# Patient Record
Sex: Female | Born: 1960
Health system: Southern US, Community
[De-identification: ages and names within clinical notes are randomized; demographics above are authoritative.]

## PROBLEM LIST (undated history)

## (undated) DIAGNOSIS — R011 Cardiac murmur, unspecified: Secondary | ICD-10-CM

## (undated) DIAGNOSIS — A159 Respiratory tuberculosis unspecified: Secondary | ICD-10-CM

## (undated) DIAGNOSIS — F1911 Other psychoactive substance abuse, in remission: Secondary | ICD-10-CM

## (undated) DIAGNOSIS — K219 Gastro-esophageal reflux disease without esophagitis: Secondary | ICD-10-CM

## (undated) DIAGNOSIS — N950 Postmenopausal bleeding: Secondary | ICD-10-CM

## (undated) DIAGNOSIS — G43909 Migraine, unspecified, not intractable, without status migrainosus: Secondary | ICD-10-CM

## (undated) DIAGNOSIS — E119 Type 2 diabetes mellitus without complications: Secondary | ICD-10-CM

## (undated) DIAGNOSIS — Z5189 Encounter for other specified aftercare: Secondary | ICD-10-CM

## (undated) DIAGNOSIS — R0609 Other forms of dyspnea: Secondary | ICD-10-CM

## (undated) DIAGNOSIS — N840 Polyp of corpus uteri: Secondary | ICD-10-CM

## (undated) DIAGNOSIS — R3915 Urgency of urination: Secondary | ICD-10-CM

## (undated) DIAGNOSIS — R0600 Dyspnea, unspecified: Secondary | ICD-10-CM

## (undated) DIAGNOSIS — E785 Hyperlipidemia, unspecified: Secondary | ICD-10-CM

## (undated) DIAGNOSIS — M1990 Unspecified osteoarthritis, unspecified site: Secondary | ICD-10-CM

## (undated) DIAGNOSIS — I2510 Atherosclerotic heart disease of native coronary artery without angina pectoris: Secondary | ICD-10-CM

## (undated) DIAGNOSIS — I10 Essential (primary) hypertension: Secondary | ICD-10-CM

## (undated) DIAGNOSIS — R51 Headache: Secondary | ICD-10-CM

## (undated) DIAGNOSIS — R42 Dizziness and giddiness: Secondary | ICD-10-CM

## (undated) DIAGNOSIS — M25511 Pain in right shoulder: Secondary | ICD-10-CM

## (undated) DIAGNOSIS — M722 Plantar fascial fibromatosis: Secondary | ICD-10-CM

## (undated) DIAGNOSIS — Z9289 Personal history of other medical treatment: Secondary | ICD-10-CM

## (undated) DIAGNOSIS — T7840XA Allergy, unspecified, initial encounter: Secondary | ICD-10-CM

## (undated) DIAGNOSIS — F1910 Other psychoactive substance abuse, uncomplicated: Secondary | ICD-10-CM

## (undated) HISTORY — DX: Essential (primary) hypertension: I10

## (undated) HISTORY — DX: Urgency of urination: R39.15

## (undated) HISTORY — DX: Gastro-esophageal reflux disease without esophagitis: K21.9

## (undated) HISTORY — PX: TUBAL LIGATION: SHX77

## (undated) HISTORY — DX: Headache: R51

## (undated) HISTORY — DX: Personal history of other medical treatment: Z92.89

## (undated) HISTORY — DX: Allergy, unspecified, initial encounter: T78.40XA

## (undated) HISTORY — DX: Cardiac murmur, unspecified: R01.1

## (undated) HISTORY — DX: Dizziness and giddiness: R42

## (undated) HISTORY — DX: Unspecified osteoarthritis, unspecified site: M19.90

## (undated) HISTORY — PX: UPPER GI ENDOSCOPY: SHX6162

## (undated) HISTORY — DX: Other psychoactive substance abuse, uncomplicated: F19.10

## (undated) HISTORY — DX: Migraine, unspecified, not intractable, without status migrainosus: G43.909

## (undated) HISTORY — PX: COLONOSCOPY: SHX174

## (undated) HISTORY — DX: Type 2 diabetes mellitus without complications: E11.9

## (undated) HISTORY — DX: Pain in right shoulder: M25.511

## (undated) HISTORY — DX: Encounter for other specified aftercare: Z51.89

## (undated) HISTORY — PX: KNEE SURGERY: SHX244

## (undated) HISTORY — PX: NASAL POLYP SURGERY: SHX186

## (undated) HISTORY — DX: Hyperlipidemia, unspecified: E78.5

## (undated) HISTORY — DX: Atherosclerotic heart disease of native coronary artery without angina pectoris: I25.10

## (undated) HISTORY — DX: Plantar fascial fibromatosis: M72.2

## (undated) HISTORY — DX: Respiratory tuberculosis unspecified: A15.9

## (undated) HISTORY — DX: Polyp of corpus uteri: N84.0

## (undated) HISTORY — PX: MYOMECTOMY: SHX85

---

## 1996-01-02 DIAGNOSIS — A159 Respiratory tuberculosis unspecified: Secondary | ICD-10-CM

## 1996-01-02 DIAGNOSIS — Z8611 Personal history of tuberculosis: Secondary | ICD-10-CM

## 1996-01-02 HISTORY — DX: Personal history of tuberculosis: Z86.11

## 1996-01-02 HISTORY — DX: Respiratory tuberculosis unspecified: A15.9

## 1998-12-22 ENCOUNTER — Emergency Department (HOSPITAL_COMMUNITY): Admission: EM | Admit: 1998-12-22 | Discharge: 1998-12-22 | Payer: Self-pay | Admitting: Emergency Medicine

## 2000-04-07 ENCOUNTER — Emergency Department (HOSPITAL_COMMUNITY): Admission: EM | Admit: 2000-04-07 | Discharge: 2000-04-07 | Payer: Self-pay | Admitting: Emergency Medicine

## 2000-04-12 ENCOUNTER — Emergency Department (HOSPITAL_COMMUNITY): Admission: EM | Admit: 2000-04-12 | Discharge: 2000-04-12 | Payer: Self-pay | Admitting: Emergency Medicine

## 2001-05-04 ENCOUNTER — Emergency Department (HOSPITAL_COMMUNITY): Admission: EM | Admit: 2001-05-04 | Discharge: 2001-05-04 | Payer: Self-pay | Admitting: Emergency Medicine

## 2005-03-14 ENCOUNTER — Emergency Department (HOSPITAL_COMMUNITY): Admission: EM | Admit: 2005-03-14 | Discharge: 2005-03-15 | Payer: Self-pay | Admitting: Emergency Medicine

## 2005-03-17 ENCOUNTER — Emergency Department (HOSPITAL_COMMUNITY): Admission: EM | Admit: 2005-03-17 | Discharge: 2005-03-17 | Payer: Self-pay | Admitting: *Deleted

## 2005-06-26 ENCOUNTER — Ambulatory Visit: Payer: Self-pay | Admitting: Internal Medicine

## 2005-06-26 ENCOUNTER — Other Ambulatory Visit: Admission: RE | Admit: 2005-06-26 | Discharge: 2005-06-26 | Payer: Self-pay | Admitting: Family Medicine

## 2005-10-03 ENCOUNTER — Ambulatory Visit: Payer: Self-pay | Admitting: Internal Medicine

## 2005-10-11 ENCOUNTER — Ambulatory Visit: Payer: Self-pay | Admitting: Internal Medicine

## 2005-10-11 ENCOUNTER — Ambulatory Visit: Admission: RE | Admit: 2005-10-11 | Discharge: 2005-10-11 | Payer: Self-pay | Admitting: Internal Medicine

## 2005-10-11 ENCOUNTER — Encounter (INDEPENDENT_AMBULATORY_CARE_PROVIDER_SITE_OTHER): Payer: Self-pay | Admitting: Specialist

## 2006-03-25 ENCOUNTER — Emergency Department (HOSPITAL_COMMUNITY): Admission: EM | Admit: 2006-03-25 | Discharge: 2006-03-25 | Payer: Self-pay | Admitting: Family Medicine

## 2006-04-01 ENCOUNTER — Emergency Department (HOSPITAL_COMMUNITY): Admission: EM | Admit: 2006-04-01 | Discharge: 2006-04-01 | Payer: Self-pay | Admitting: Family Medicine

## 2006-08-05 ENCOUNTER — Emergency Department (HOSPITAL_COMMUNITY): Admission: EM | Admit: 2006-08-05 | Discharge: 2006-08-05 | Payer: Self-pay | Admitting: Emergency Medicine

## 2006-08-16 ENCOUNTER — Emergency Department (HOSPITAL_COMMUNITY): Admission: EM | Admit: 2006-08-16 | Discharge: 2006-08-16 | Payer: Self-pay | Admitting: Family Medicine

## 2006-09-24 ENCOUNTER — Emergency Department (HOSPITAL_COMMUNITY): Admission: EM | Admit: 2006-09-24 | Discharge: 2006-09-24 | Payer: Self-pay | Admitting: Emergency Medicine

## 2006-12-23 ENCOUNTER — Emergency Department (HOSPITAL_COMMUNITY): Admission: EM | Admit: 2006-12-23 | Discharge: 2006-12-23 | Payer: Self-pay | Admitting: Emergency Medicine

## 2007-04-25 ENCOUNTER — Emergency Department (HOSPITAL_COMMUNITY): Admission: EM | Admit: 2007-04-25 | Discharge: 2007-04-25 | Payer: Self-pay | Admitting: Emergency Medicine

## 2007-07-25 ENCOUNTER — Ambulatory Visit: Payer: Self-pay | Admitting: *Deleted

## 2007-07-25 ENCOUNTER — Encounter (INDEPENDENT_AMBULATORY_CARE_PROVIDER_SITE_OTHER): Payer: Self-pay | Admitting: *Deleted

## 2007-07-29 ENCOUNTER — Ambulatory Visit (HOSPITAL_COMMUNITY): Admission: RE | Admit: 2007-07-29 | Discharge: 2007-07-29 | Payer: Self-pay | Admitting: Family Medicine

## 2007-08-06 ENCOUNTER — Encounter: Admission: RE | Admit: 2007-08-06 | Discharge: 2007-08-06 | Payer: Self-pay | Admitting: Family Medicine

## 2008-04-06 ENCOUNTER — Emergency Department (HOSPITAL_COMMUNITY): Admission: EM | Admit: 2008-04-06 | Discharge: 2008-04-06 | Payer: Self-pay | Admitting: Emergency Medicine

## 2008-05-17 ENCOUNTER — Encounter (INDEPENDENT_AMBULATORY_CARE_PROVIDER_SITE_OTHER): Payer: Self-pay | Admitting: General Surgery

## 2008-05-17 ENCOUNTER — Ambulatory Visit (HOSPITAL_BASED_OUTPATIENT_CLINIC_OR_DEPARTMENT_OTHER): Admission: RE | Admit: 2008-05-17 | Discharge: 2008-05-17 | Payer: Self-pay | Admitting: General Surgery

## 2008-05-17 HISTORY — PX: SOFT TISSUE MASS EXCISION: SHX2419

## 2008-08-13 ENCOUNTER — Encounter: Admission: RE | Admit: 2008-08-13 | Discharge: 2008-08-13 | Payer: Self-pay | Admitting: Emergency Medicine

## 2008-09-14 ENCOUNTER — Emergency Department (HOSPITAL_COMMUNITY): Admission: EM | Admit: 2008-09-14 | Discharge: 2008-09-14 | Payer: Self-pay | Admitting: Emergency Medicine

## 2008-12-28 ENCOUNTER — Emergency Department (HOSPITAL_COMMUNITY): Admission: EM | Admit: 2008-12-28 | Discharge: 2008-12-28 | Payer: Self-pay | Admitting: Emergency Medicine

## 2009-02-09 ENCOUNTER — Ambulatory Visit (HOSPITAL_COMMUNITY): Admission: RE | Admit: 2009-02-09 | Discharge: 2009-02-09 | Payer: Self-pay | Admitting: Family Medicine

## 2009-02-14 ENCOUNTER — Encounter: Admission: RE | Admit: 2009-02-14 | Discharge: 2009-02-14 | Payer: Self-pay | Admitting: Family Medicine

## 2009-02-25 ENCOUNTER — Ambulatory Visit: Payer: Self-pay | Admitting: Obstetrics and Gynecology

## 2009-02-25 LAB — CONVERTED CEMR LAB: Pap Smear: NEGATIVE

## 2009-09-07 ENCOUNTER — Emergency Department (HOSPITAL_COMMUNITY): Admission: EM | Admit: 2009-09-07 | Discharge: 2009-09-08 | Payer: Self-pay | Admitting: Emergency Medicine

## 2010-01-01 HISTORY — PX: BREAST BIOPSY: SHX20

## 2010-04-03 LAB — CBC
Hemoglobin: 12.9 g/dL (ref 12.0–15.0)
RDW: 13.3 % (ref 11.5–15.5)

## 2010-04-03 LAB — URINE MICROSCOPIC-ADD ON

## 2010-04-03 LAB — DIFFERENTIAL
Basophils Relative: 1 % (ref 0–1)
Eosinophils Absolute: 0.1 10*3/uL (ref 0.0–0.7)
Lymphocytes Relative: 26 % (ref 12–46)
Lymphs Abs: 2.1 10*3/uL (ref 0.7–4.0)
Monocytes Absolute: 0.4 10*3/uL (ref 0.1–1.0)
Monocytes Relative: 5 % (ref 3–12)

## 2010-04-03 LAB — BASIC METABOLIC PANEL
Calcium: 9.3 mg/dL (ref 8.4–10.5)
Chloride: 105 mEq/L (ref 96–112)
Creatinine, Ser: 0.68 mg/dL (ref 0.4–1.2)
GFR calc non Af Amer: >60 mL/min (ref >60)
Potassium: 3.9 mEq/L (ref 3.5–5.1)

## 2010-04-03 LAB — URINALYSIS, ROUTINE W REFLEX MICROSCOPIC
Glucose, UA: NEGATIVE mg/dL
Hgb urine dipstick: NEGATIVE
Ketones, ur: 15 mg/dL — AB
Nitrite: NEGATIVE
Specific Gravity, Urine: 1.027 (ref 1.005–1.030)

## 2010-04-07 LAB — POCT URINALYSIS DIP (DEVICE)
Bilirubin Urine: NEGATIVE
Hgb urine dipstick: NEGATIVE
Ketones, ur: NEGATIVE mg/dL
Nitrite: NEGATIVE
Specific Gravity, Urine: >=1.03 (ref 1.005–1.030)

## 2010-04-11 LAB — CBC
HCT: 34.2 % — ABNORMAL LOW (ref 36.0–46.0)
Hemoglobin: 11.5 g/dL — ABNORMAL LOW (ref 12.0–15.0)
MCHC: 33.7 g/dL (ref 30.0–36.0)
MCV: 91.9 fL (ref 78.0–100.0)
Platelets: 226 10*3/uL (ref 150–400)
RBC: 3.72 MIL/uL — ABNORMAL LOW (ref 3.87–5.11)
RDW: 13.1 % (ref 11.5–15.5)
WBC: 5.6 10*3/uL (ref 4.0–10.5)

## 2010-04-11 LAB — URINALYSIS, ROUTINE W REFLEX MICROSCOPIC
Glucose, UA: NEGATIVE mg/dL
Nitrite: NEGATIVE
Urobilinogen, UA: 1 mg/dL (ref 0.0–1.0)
pH: 5.5 (ref 5.0–8.0)

## 2010-04-11 LAB — COMPREHENSIVE METABOLIC PANEL
Albumin: 3.9 g/dL (ref 3.5–5.2)
Alkaline Phosphatase: 91 U/L (ref 39–117)
BUN: 12 mg/dL (ref 6–23)
CO2: 25 mEq/L (ref 19–32)
Chloride: 104 mEq/L (ref 96–112)
Creatinine, Ser: 0.66 mg/dL (ref 0.4–1.2)
GFR calc Af Amer: >60 mL/min (ref >60)
GFR calc non Af Amer: >60 mL/min (ref >60)
Glucose, Bld: 114 mg/dL — ABNORMAL HIGH (ref 70–99)
Sodium: 137 mEq/L (ref 135–145)
Total Bilirubin: 0.5 mg/dL (ref 0.3–1.2)
Total Protein: 6.8 g/dL (ref 6.0–8.3)

## 2010-04-11 LAB — DIFFERENTIAL
Lymphs Abs: 1.9 10*3/uL (ref 0.7–4.0)
Monocytes Absolute: 0.4 10*3/uL (ref 0.1–1.0)
Monocytes Relative: 8 % (ref 3–12)
Neutrophils Relative %: 54 % (ref 43–77)

## 2010-04-11 LAB — URINE MICROSCOPIC-ADD ON

## 2010-05-16 NOTE — Op Note (Signed)
NAME:  Brianna Hawkins, Brianna Hawkins              ACCOUNT NO.:  [id number]   MEDICAL RECORD NO.:  [id number]          PATIENT TYPE:  AMB   LOCATION:  [facility]                         FACILITY:  [facility]   PHYSICIAN:  Angelia Mould. Derrell Lolling, M.D.DATE OF BIRTH:  [birth year]   DATE OF PROCEDURE:  05/17/2008  DATE OF DISCHARGE:                               OPERATIVE REPORT   PREOPERATIVE DIAGNOSIS:  A 6 x 3-cm soft tissue mass, right axilla.   POSTOPERATIVE DIAGNOSIS:  A 6 x 3-cm soft tissue mass, right axilla,  plus redundant skin.   OPERATION PERFORMED:  1. Excision 6-cm x 3-cm soft tissue mass, deep right axillary space.  2. Revision of right axillary scar for redundant skin.   SURGEON:  Angelia Mould. Derrell Lolling, M.D.   OPERATIVE INDICATIONS:  This is a 50 year old [place] American female  who has had a lump in her right axilla for about 5 years.  It has been  more painful recently, but it has never been infected or inflamed or  reddened, and it has never drained.  She has never had a breast problem.  She had mammograms in August 2009.  There were no abnormalities found.  The right axilla just showed fatty tissue and normal-appearing axillary  nodes.  I evaluated her as an outpatient and found that she had a 6-cm x  3-cm soft tissue mass in the right axilla which was protruding outward  and stretching the skin, but felt like fatty tissue or possibly ectopic  breast tissue.  She desired that this be excised to clarify her  diagnosis and to remove the mass effect from her right axilla.   OPERATIVE TECHNIQUE:  Following the induction of general LMA anesthesia,  the patient's right chest wall and right axilla were prepped and draped  in a sterile fashion.  A surgical time-out was held, identifying correct  patient, correct procedure and correct site.  Intravenous antibiotics  were given.  Half percent Marcaine with epinephrine was used as a local  infiltration anesthetic.  A slightly curved transverse incision was  made  in the skin crease overlying the mass.  Dissection was carried down into  the superficial and subcutaneous tissue.  I spent some time debriding  subcutaneous fat and then extending down into the axilla.  The margins  of this mass were poorly defined.  It simply just seemed to be an  infiltrating but benign fatty process.  I was not sure whether this was  all lipomatous tissue or whether there it was also ectopic breast  tissue.  It was debrided deeply into the axilla until the entire mass  effect was removed.   This left excessive redundant skin.  With the skin on stretch, I used a  marking pen and marked an elliptical incision all the way around this,  and then I excised all of the skin using the markers as a guide.  This  did flatten the skin out quite nicely.  The wound was irrigated with  saline.  Hemostasis was excellent and achieved with electrocautery.  The  deeper soft tissues were closed with interrupted sutures of 3-0  Vicryl  and the skin was closed with a running  subcuticular suture of 4-0 Monocryl and Steri-Strips.  Clean bandages  were placed and the patient taken to recovery room in stable condition.  Estimated blood loss was about 20 mL.  Complications:  None.  Sponge,  needle and instrument counts were correct.      Angelia Mould. Derrell Lolling, M.D.  Electronically Signed     HMI/MEDQ  D:  05/17/2008  T:  05/17/2008  Job:  [id number]   cc:   Elvina Sidle, M.D.  Fax: [phone]

## 2010-05-16 NOTE — Group Therapy Note (Signed)
NAME:  Brianna Hawkins, Brianna Hawkins NO.:  [id number]   MEDICAL RECORD NO.:  [id number]          PATIENT TYPE:  WOC   LOCATION:  [facility]                   FACILITY:  [facility]   PHYSICIAN:  Karlton Lemon, MD      DATE OF BIRTH:  1960/07/02   DATE OF SERVICE:  07/25/2007                                  CLINIC NOTE   CHIEF COMPLAINT:  Annual examination.   HISTORY OF PRESENT ILLNESS:  A 50 year old gravida 5, para 4-0-1-4  presenting for annual well-woman examination.  She states that she has  not had a well-woman exam in greater than 7 years.  She has never had a  mammogram.  She states that she is fairly healthy and has no chronic  medical conditions for which she takes any medications or sees a  physician.  She states that her periods are fairly regular and occurring  every 28 days and lasting for 7 days.  She states she uses 6 tampons on  average a day.  She says that every once in a while she will have her  periods that are 21 days apart, but that is only on rare occasions.  She  states that she has noted a tender a bump in her right axilla.  She has  no other complaints of breast masses.  She does state that her breasts  are more tender than they used to be.  She notes no dimpling or drainage  from the nipple.  She is otherwise well today.  There is no other  complaints.   PAST MEDICAL HISTORY:  Positive only for some mild arthritis which she  treats with naproxen as needed.   OBSTETRICAL HISTORY:  The patient is a gravida 5, para 4-0-1-4 with  history of 1 therapeutic abortions and 4 term vaginal deliveries.   GYNECOLOGIC HISTORY:  The patient reports no abnormal Pap smears.  She  has never had a mammogram.  She has no history of gynecologic surgery  other than a tubal ligation after the delivery of her fourth child.   PAST SURGICAL HISTORY:  The patient has had a postpartum bilateral tubal  ligation.   FAMILY HISTORY:  The patient says that her father had throat  and  prostate cancer, and she has a niece with cervical cancer.  No other  family medical history is reported.   SOCIAL HISTORY:  The patient works in Public affairs consultant at [employer].  She is currently in a monogamous relationship with 1  partner.  She denies tobacco or drug abuse.  She states she does drink  about 1 can of beer weekly.   MEDICATIONS:  He takes,  1. Iron over-the-counter 65 mg 1 tablet 2-3 times a day.  2. Aleve/Naprosyn over-the-counter 1 tablet daily as needed for pain.   ALLERGIES:  The patient denies drug allergies or allergy to latex.   REVIEW OF SYSTEMS:  The patient denies chest pain, shortness of breath,  abdominal pain, vaginal discharge, or extremity swelling.  She states  she does have occasional heartburn symptoms.  Review of systems,  otherwise negative.   PHYSICAL EXAMINATION:  GENERAL:  This is a well-appearing female in no  distress.  VITAL SIGNS:  Temperature 97.4, pulse 69, blood pressure 132/89,  respiratory rate 20, and weight 222.7 pounds.  NECK:  Thyroid is normal in size and there are no palpable nodules or  masses in the thyroid.  There is no neck lymphadenopathy.  BREASTS:  Symmetric bilaterally without dimpling or retraction of the  nipple.  There are no masses felt within the breast tissue.  In the  right axilla, there is a tender mass measuring about 3-4 cm in size that  is mobile and not fluctuant.  Otherwise no masses palpated in the breast  or left axilla.  CARDIOVASCULAR:  Heart is regular rate and rhythm.  No murmurs, rubs, or  gallops appreciated.  LUNGS:  Clear to auscultation.  RESPIRATORY:  Lungs are clear to auscultation bilaterally.  ABDOMEN:  Soft, nontender to palpation.  GENITOURINARY:  Normal female external genitalia.  Vaginal mucosa is  pink and moist.  Cervix is midline but difficult to visualize secondary  to redundant vaginal wall tissue.  Cervix is well visualized.  The os is  closed.  Pap smear  is collected.  Bimanual examination is limited  secondary to body habitus.  The uterus feels normal in size.  Adnexa are  not palpable.  There is no tenderness on bimanual examination.  EXTREMITIES:  There is no clubbing, cyanosis, or edema.   ASSESSMENT AND PLAN:  This is a 50 year old gravida 5, para 4-0-1-4,  here for annual examination.  The patient is noted to have a right  axillary tender mass/possible lymph node.  1. Well-woman examination is performed with Pap smear and breast      examination.  The patient will be referred for routine mammogram.  2. The patient will be started on Keflex 500 mg 2 tablets b.i.d. for      10 days to treat any infection that may be complicating this lymph      node enlargement or tenderness.  The patient is also to follow up      with General Surgery for consultation on this right axillary mass.      The patient is to follow up in 1 year for repeat annual      examination.           ______________________________  Karlton Lemon, MD     NS/MEDQ  D:  07/25/2007  T:  07/25/2007  Job:  [id number]

## 2010-05-19 NOTE — Op Note (Signed)
NAME:  Brianna Hawkins, Brianna Hawkins              ACCOUNT NO.:  [id number]   MEDICAL RECORD NO.:  [id number]          PATIENT TYPE:  AMB   LOCATION:  [facility]                         FACILITY:  [facility]   PHYSICIAN:  Casimiro Needle B. Sherene Sires, MD, FCCPDATE OF BIRTH:  [birth year]   DATE OF PROCEDURE:  DATE OF DISCHARGE:                                 OPERATIVE REPORT   REFERRED BY:  Lavonda Jumbo, M.D., [facility].   HISTORY AND INDICATIONS:  Please see dictated pulmonary consultation note.  The patient agreed to the procedure after a full discussion of the risks,  benefits, alternatives in the office setting.   The patient was monitored by continuous surface ECG and oximetry and  maintained adequate saturations throughout the procedure.  She received 1%  lidocaine by updraft nebulizer and 2% lidocaine jelly to the right nares.   The right naris was easily cannulated with good visualization of the entire  oropharynx and larynx.  The cords moved normally and there were no obvious  upper airway lesions.   The patient was medicated with a total of 5 mg of IV Versed and 50 mg of IV  Demerol throughout the procedure with adequate sedation and cough  suppression. An additional 1% lidocaine as needed was used as well.   FINDINGS:  The trachea, carina, and all the major airways opened widely to  the subsegmental level except for the right upper lobe anterior segment.  Here there appeared to be about a 75% obstruction at the orifice with a  smooth bulge of tissue that appeared to be extrinsic in nature with normal  mucosa over it.   Initially I attempted to biopsy the mass seen on chest x-ray by fluoroscopy  but could not get the scope to go anterior enough for this.  Part of the  problem was the bulge pushed the bronchoscope to the airway that did not  correspond to the density.   The right upper lobe was initially lavaged with adequate return.   I therefore performed a standard Wang biopsy at the orifice of the right  upper lobe with adequate tissue obtained.  There was moderate bleeding which  cleared with saline lavage and suctioning.   IMPRESSION:  Mass with 75% obstruction of the right upper lobe.  Although  this may be benign lymph tissue, the amount of obstruction is significant  and easily explains the abnormality seen on x-ray and probably is at least  partially obstructing one of the subsegments of the right upper lobe.   RECOMMENDATION:  Await bronchial lavage and Wang biopsy for results. Follow-  up in the outpatient setting which will include a PET scan to see if this is  a biologically active area or not.          ______________________________  Charlaine Dalton. Sherene Sires, MD, [facility]    MBW/MEDQ  D:  10/11/2005  T:  10/13/2005  Job:  [id number]

## 2010-05-19 NOTE — Assessment & Plan Note (Signed)
[facility]                               PULMONARY OFFICE NOTE   NAME:Brianna Hawkins, Depaulo                       MRN:          [id number]  DATE:10/03/2005                            DOB:          [birth year]    HISTORY:  A 50 year old black female with a history of smoking and a right  upper lobe mass by chest x-ray and CT scan who has failed to return to the  office, had listed a phone number no long in service and was contacted by  mail and asked to return.  The letter was mailed on August 08, 2005.  She  returns now with no significant symptoms for a follow-up x-ray.  She  denies any hemoptysis, pleuritic pain, fevers, chills, sweats, orthopnea,  PND or leg swelling.  Please see previous evaluation dictated 08/26/2005 in  the e-chart record.   PHYSICAL EXAMINATION:  GENERAL:  She is a pleasant, ambulatory black female  in no acute distress.  VITAL SIGNS:  She had stable vital signs.  HEENT:  Unremarkable.  CHEST:  Superior lung fields revealed diminished breath sounds to the right  apex overall air movement is adequate.  HEART:  Regular rhythm without murmur, gallop or rub.  ABDOMEN:  Soft, benign.  EXTREMITIES:  No calf tenderness, cyanosis or clubbing.   Chest x-ray shows reduction in the mass in the right chest; however, most  of this is because of distal atelectasis.   IMPRESSION:  I am concerned that isolated right upper lobe atelectasis  anterior is most likely not due to passive atelectasis from mucous plug or  previous pneumonia, but rather due to obstruction proximally.  I have  discussed this openly with the patient and strongly she recommended she  proceed to bronchoscopy as soon as she can schedule it.  Tentatively, we  have set the date for October 11, 2005 after full discussion of risks,  benefits and alternatives.            ______________________________  Charlaine Dalton Sherene Sires, MD, [facility]      MBW/MedQ  DD:  10/03/2005  DT:   10/05/2005  Job #:  [id number]

## 2010-07-04 ENCOUNTER — Emergency Department (HOSPITAL_COMMUNITY)
Admission: EM | Admit: 2010-07-04 | Discharge: 2010-07-04 | Disposition: A | Discharge status: Home / Self Care | Payer: PRIVATE HEALTH INSURANCE | Attending: Emergency Medicine | Admitting: Emergency Medicine

## 2010-07-04 DIAGNOSIS — M25569 Pain in unspecified knee: Secondary | ICD-10-CM | POA: Insufficient documentation

## 2010-07-04 DIAGNOSIS — M25559 Pain in unspecified hip: Secondary | ICD-10-CM | POA: Insufficient documentation

## 2010-07-04 DIAGNOSIS — Y990 Civilian activity done for income or pay: Secondary | ICD-10-CM | POA: Insufficient documentation

## 2010-07-04 DIAGNOSIS — W010XXA Fall on same level from slipping, tripping and stumbling without subsequent striking against object, initial encounter: Secondary | ICD-10-CM | POA: Insufficient documentation

## 2010-07-04 DIAGNOSIS — Y9269 Other specified industrial and construction area as the place of occurrence of the external cause: Secondary | ICD-10-CM | POA: Insufficient documentation

## 2010-09-26 LAB — POCT I-STAT, CHEM 8
BUN: 12
Calcium, Ion: 1.25
Chloride: 105
Creatinine, Ser: 0.9
Glucose, Bld: 96
HCT: 37
Hemoglobin: 12.6
Sodium: 140

## 2011-01-12 ENCOUNTER — Encounter (HOSPITAL_COMMUNITY): Payer: Self-pay | Admitting: *Deleted

## 2011-01-12 ENCOUNTER — Emergency Department (HOSPITAL_COMMUNITY)
Admission: EM | Admit: 2011-01-12 | Discharge: 2011-01-12 | Disposition: A | Discharge status: Home / Self Care | Payer: PRIVATE HEALTH INSURANCE | Attending: Emergency Medicine | Admitting: Emergency Medicine

## 2011-01-12 DIAGNOSIS — H81399 Other peripheral vertigo, unspecified ear: Secondary | ICD-10-CM | POA: Insufficient documentation

## 2011-01-12 DIAGNOSIS — R112 Nausea with vomiting, unspecified: Secondary | ICD-10-CM | POA: Insufficient documentation

## 2011-01-12 MED ORDER — ONDANSETRON 4 MG PO TBDP: 4.0000 mg | ORAL_TABLET | Freq: Three times a day (TID) | ORAL | Status: AC | PRN

## 2011-01-12 MED ORDER — MECLIZINE HCL 32 MG PO TABS: 32.0000 mg | ORAL_TABLET | Freq: Three times a day (TID) | ORAL | Status: AC | PRN

## 2011-01-12 NOTE — ED Notes (Signed)
Dizziness and nausea this afternoon.  She has had x2  In the past.

## 2011-01-12 NOTE — ED Notes (Signed)
MD at bedside. Dr.Miller with patient 

## 2011-01-12 NOTE — ED Provider Notes (Signed)
History     CSN: [id number]  Arrival date & time 01/12/11  2224   First MD Initiated Contact with Patient 01/12/11 2258      Chief Complaint  Patient presents with  . Dizziness    (Consider location/radiation/quality/duration/timing/severity/associated sxs/prior treatment) HPI Comments: 51 year old female with history of borderline hypertension who presents with a complaint of dizziness. This was acute in onset approximately 8 hours prior to arrival. It is intermittent, described as the room spinning and associated with nausea and vomiting. She states that initially it started when she was moving her head raising her arms and looking up. It goes away if she holds her head still, is not associated with blurry vision or double vision or numbness weakness or tingling. Symptoms have been intermittent throughout the day, multiple episodes but not currently feeling dizzy. She denies any chest pain palpitation shortness of breath abdominal pain dysuria diarrhea or rash easy bruising sore throat cough or any other complaints.  The history is provided by the patient.    History reviewed. No pertinent past medical history.  History reviewed. No pertinent past surgical history.  History reviewed. No pertinent family history.  History  Substance Use Topics  . Smoking status: Never Smoker   . Smokeless tobacco: Not on file  . Alcohol Use: No    OB History    Grav Para Term Preterm Abortions TAB SAB Ect Mult Living                  Review of Systems  All other systems reviewed and are negative.    Allergies  Review of patient's allergies indicates no known allergies.  Home Medications   Current Outpatient Rx  Name Route Sig Dispense Refill  . MECLIZINE HCL 32 MG PO TABS Oral Take 1 tablet (32 mg total) by mouth 3 (three) times daily as needed. 30 tablet 0  . ONDANSETRON 4 MG PO TBDP Oral Take 1 tablet (4 mg total) by mouth every 8 (eight) hours as needed for nausea. 10 tablet 0      BP 149/98  Pulse 71  Temp(Src) 98.1 F (36.7 C) (Oral)  Resp 18  Ht 5\' 4"  (1.626 m)  Wt 218 lb (98.884 kg)  BMI 37.42 kg/m2  SpO2 97%  LMP 01/12/2011  Physical Exam  Nursing note and vitals reviewed. Constitutional: She appears well-developed and well-nourished. No distress.  HENT:  Head: Normocephalic and atraumatic.  Mouth/Throat: Oropharynx is clear and moist. No oropharyngeal exudate.  Eyes: Conjunctivae and EOM are normal. Pupils are equal, round, and reactive to light. Right eye exhibits no discharge. Left eye exhibits no discharge. No scleral icterus.  Neck: Normal range of motion. Neck supple. No JVD present. No thyromegaly present.  Cardiovascular: Normal rate, regular rhythm, normal heart sounds and intact distal pulses.  Exam reveals no gallop and no friction rub.   No murmur heard. Pulmonary/Chest: Effort normal and breath sounds normal. No respiratory distress. She has no wheezes. She has no rales.  Abdominal: Soft. Bowel sounds are normal. She exhibits no distension and no mass. There is no tenderness.  Musculoskeletal: Normal range of motion. She exhibits no edema and no tenderness.  Lymphadenopathy:    She has no cervical adenopathy.  Neurological: She is alert. Coordination normal.       Neurologic exam:  Speech clear, pupils equal round reactive to light, extraocular movements intact  Normal peripheral visual fields Cranial nerves III through XII normal including no facial droop Follows commands, moves all  extremities x4, normal strength to bilateral upper and lower extremities at all major muscle groups including grip Sensation normal to light touch and pinprick Coordination intact, no limb ataxia, finger-nose-finger normal Rapid alternating movements normal No pronator drift Gait normal   Skin: Skin is warm and dry. No rash noted. No erythema.  Psychiatric: She has a normal mood and affect. Her behavior is normal.    ED Course  Procedures  (including critical care time)  Labs Reviewed - No data to display No results found.   1. Peripheral vertigo       MDM  Likely peripheral vertigo, no inducible vertigo on my exam with of the head or change in position. She does admit to having this intermittently every few months lasting for only 3 minutes before resolving. Will try meclizine and Zofran at home however the patient has no symptoms at this time and can be safely discharged with someone to drive her.   I have explained at length the indications for return and she is expressed her understanding.     Vida Roller, MD 01/12/11 [phone]

## 2011-02-07 ENCOUNTER — Encounter: Payer: Self-pay | Admitting: Family Medicine

## 2011-02-07 DIAGNOSIS — R519 Headache, unspecified: Secondary | ICD-10-CM | POA: Insufficient documentation

## 2011-02-07 DIAGNOSIS — R51 Headache: Secondary | ICD-10-CM

## 2011-02-08 ENCOUNTER — Encounter: Payer: Self-pay | Admitting: Family Medicine

## 2011-02-08 ENCOUNTER — Ambulatory Visit (INDEPENDENT_AMBULATORY_CARE_PROVIDER_SITE_OTHER): Payer: 59 | Admitting: Family Medicine

## 2011-02-08 DIAGNOSIS — I152 Hypertension secondary to endocrine disorders: Secondary | ICD-10-CM | POA: Insufficient documentation

## 2011-02-08 DIAGNOSIS — J3489 Other specified disorders of nose and nasal sinuses: Secondary | ICD-10-CM

## 2011-02-08 DIAGNOSIS — R51 Headache: Secondary | ICD-10-CM

## 2011-02-08 DIAGNOSIS — G4700 Insomnia, unspecified: Secondary | ICD-10-CM

## 2011-02-08 DIAGNOSIS — I10 Essential (primary) hypertension: Secondary | ICD-10-CM | POA: Insufficient documentation

## 2011-02-08 DIAGNOSIS — H8110 Benign paroxysmal vertigo, unspecified ear: Secondary | ICD-10-CM

## 2011-02-08 MED ORDER — PROPRANOLOL HCL ER 120 MG PO CP24: 120.0000 mg | ORAL_CAPSULE | Freq: Every day | ORAL | Status: DC

## 2011-02-08 MED ORDER — MUPIROCIN 2 % EX OINT: TOPICAL_OINTMENT | Freq: Three times a day (TID) | CUTANEOUS | Status: AC

## 2011-02-08 MED ORDER — MIRTAZAPINE 7.5 MG PO TABS: 7.5000 mg | ORAL_TABLET | Freq: Every day | ORAL | Status: DC

## 2011-02-08 NOTE — Progress Notes (Signed)
Ehle 3 problems. First is a recent bout of vertigo. The second problem is hypertension. The third problem is insomnia. Fourth problem is nasal pain.  Vertigo began one month ago and a violent way with an acute onset associated with vomiting. It lasted a couple days and gradually subsided although she still has some imbalance now. She's had no tinnitus and no hearing loss. She also has no ear pain. Is the first episode of this.  She eventually went to the emergency room where she was found to have an elevated blood pressure. She was told to resume her old blood pressure medicine propranolol. Since that time she's had no headaches and has had no dizziness or depression.  Problem patient has is insomnia which has been going on for months. She finds that she cannot fall asleep because her mind is racing.  Fourth problem is nasal pain. Experience this couple weeks ago and is subsided after several days. 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  Objective: Patient is a middle-age woman in no acute distress warm and dry skin.  Neurological exam: Normal with pupils equal and reactive to light and accommodation, EONM I, commotion all 4 extremities. And she has a negative Romberg.  Eye exam normal fundi. Nose exam is normal. We discussed her sleep habits. She heard about Alfonso Patten and was interested in trying a medication that her friend had been using.  Assessment: Chronic headaches with hypertension, vertigo which is resolving insomnia which is chronic and related to stress, and recent nasal pain possibly related to MRSA MRSA  Plan: Expect the vertigo to clear on its own. Stay on propranolol 120 S.A. start Remeron 7.5 mg one half to one when necessary at bedtime. Bactroban to nose twice a day.

## 2011-03-07 ENCOUNTER — Ambulatory Visit (INDEPENDENT_AMBULATORY_CARE_PROVIDER_SITE_OTHER): Payer: 59 | Admitting: Physician Assistant

## 2011-03-07 VITALS — BP 127/80 | HR 71 | Temp 98.0°F | Resp 16 | Ht 66.0 in | Wt 222.8 lb

## 2011-03-07 DIAGNOSIS — N760 Acute vaginitis: Secondary | ICD-10-CM

## 2011-03-07 DIAGNOSIS — N898 Other specified noninflammatory disorders of vagina: Secondary | ICD-10-CM

## 2011-03-07 DIAGNOSIS — B373 Candidiasis of vulva and vagina: Secondary | ICD-10-CM

## 2011-03-07 LAB — POCT WET PREP WITH KOH: Trichomonas, UA: NEGATIVE

## 2011-03-07 MED ORDER — FLUCONAZOLE 150 MG PO TABS: 150.0000 mg | ORAL_TABLET | Freq: Once | ORAL | Status: AC

## 2011-03-07 MED ORDER — BORIC ACID POWD: 600.0000 mg | Status: DC | PRN

## 2011-03-07 MED ORDER — METRONIDAZOLE 500 MG PO TABS: 500.0000 mg | ORAL_TABLET | Freq: Two times a day (BID) | ORAL | Status: AC

## 2011-03-07 NOTE — Progress Notes (Signed)
  Subjective:    Patient ID: Brianna Hawkins, female    DOB: 1960/10/22, 51 y.o.   MRN: [id number]  HPI This patient presents complaining of vaginal odor, itching and discharge for 4 days. She has had this many times before-her primary care provider prescribed metronidazole for bacterial vaginosis and Diflucan for yeast infection. Patient notes that the symptoms recur after intercourse each time and it is causing her to decline intimacy with her partner. She reports inability to sleep last night secondary to severe itching. Her partner is a 51 year old female. She reports that he has no other sexual partners. They do not use condoms.   Review of Systems No fever or chills. No headache. No abdominal pain or back pain. No urinary symptoms.    Objective:   Physical Exam Vital signs are noted. This is a well-developed well-nourished black female who is awake, alert and oriented in no acute distress. Skin is warm and dry. Patient is examined in lithotomy position. Small amount of white discharge noted at the introitus. Large amount of thick yellowish white discharge within the vaginal vault. Cervix is quite deep and difficult to visualize.  Results for orders placed in visit on 03/07/11  POCT WET PREP WITH KOH      Component Value Range   Trichomonas, UA Negative     Clue Cells Wet Prep HPF POC 6-10     Epithelial Wet Prep HPF POC 6-10     Yeast Wet Prep HPF POC positive-buds     Bacteria Wet Prep HPF POC 4+     RBC Wet Prep HPF POC negative     WBC Wet Prep HPF POC 0-2     KOH Prep POC Positive         Assessment & Plan:  Vaginal discharge secondary to bacterial vaginosis and yeast vaginitis.  Metronidazole 500 mg, 1 twice a day x7 days, #14 no refills. Diflucan 150 mg, one by mouth now, repeat after last dose of metronidazole. Boric acid compounded in to 200 mg suppostories. 1 inserted vaginally after intercourse. I also suggest the use of condoms to help prevent change in vaginal  pH.  Return if symptoms worsen or persist after treatment.

## 2011-03-07 NOTE — Patient Instructions (Signed)
Try using condoms with intercourse, to reduce the fluctuation in vaginal pH.

## 2011-03-29 ENCOUNTER — Ambulatory Visit: Payer: 59

## 2011-03-29 ENCOUNTER — Ambulatory Visit (INDEPENDENT_AMBULATORY_CARE_PROVIDER_SITE_OTHER): Payer: 59 | Admitting: Family Medicine

## 2011-03-29 ENCOUNTER — Encounter: Payer: Self-pay | Admitting: Family Medicine

## 2011-03-29 VITALS — BP 137/89 | HR 59 | Temp 98.1°F | Resp 16 | Ht 65.0 in | Wt 224.0 lb

## 2011-03-29 DIAGNOSIS — R0600 Dyspnea, unspecified: Secondary | ICD-10-CM

## 2011-03-29 DIAGNOSIS — Z0000 Encounter for general adult medical examination without abnormal findings: Secondary | ICD-10-CM

## 2011-03-29 DIAGNOSIS — R5383 Other fatigue: Secondary | ICD-10-CM

## 2011-03-29 DIAGNOSIS — N3941 Urge incontinence: Secondary | ICD-10-CM

## 2011-03-29 LAB — CBC WITH DIFFERENTIAL/PLATELET
Basophils Absolute: 0 10*3/uL (ref 0.0–0.1)
Basophils Relative: 0 % (ref 0–1)
Eosinophils Absolute: 0.1 10*3/uL (ref 0.0–0.7)
Eosinophils Relative: 2 % (ref 0–5)
HCT: 38 % (ref 36.0–46.0)
Hemoglobin: 12.3 g/dL (ref 12.0–15.0)
Lymphocytes Relative: 46 % (ref 12–46)
Lymphs Abs: 2.3 10*3/uL (ref 0.7–4.0)
MCH: 28.1 pg (ref 26.0–34.0)
MCHC: 32.4 g/dL (ref 30.0–36.0)
MCV: 87 fL (ref 78.0–100.0)
Monocytes Absolute: 0.4 10*3/uL (ref 0.1–1.0)
Monocytes Relative: 8 % (ref 3–12)
Neutro Abs: 2.2 10*3/uL (ref 1.7–7.7)
Neutrophils Relative %: 44 % (ref 43–77)
Platelets: 257 10*3/uL (ref 150–400)
RBC: 4.37 MIL/uL (ref 3.87–5.11)
RDW: 14.3 % (ref 11.5–15.5)
WBC: 5 10*3/uL (ref 4.0–10.5)

## 2011-03-29 LAB — BASIC METABOLIC PANEL
BUN: 12 mg/dL (ref 6–23)
CO2: 28 mEq/L (ref 19–32)
Calcium: 9.6 mg/dL (ref 8.4–10.5)
Chloride: 105 mEq/L (ref 96–112)
Creat: 0.68 mg/dL (ref 0.50–1.10)
Glucose, Bld: 108 mg/dL — ABNORMAL HIGH (ref 70–99)
Potassium: 4.1 mEq/L (ref 3.5–5.3)
Sodium: 138 mEq/L (ref 135–145)

## 2011-03-29 LAB — POCT UA - MICROSCOPIC ONLY
Bacteria, U Microscopic: NEGATIVE
Casts, Ur, LPF, POC: NEGATIVE
Crystals, Ur, HPF, POC: NEGATIVE
Mucus, UA: NEGATIVE
RBC, urine, microscopic: NEGATIVE
Yeast, UA: NEGATIVE

## 2011-03-29 LAB — POCT URINALYSIS DIPSTICK
Bilirubin, UA: NEGATIVE
Blood, UA: NEGATIVE
Glucose, UA: NEGATIVE
Ketones, UA: NEGATIVE
Leukocytes, UA: NEGATIVE
Nitrite, UA: NEGATIVE
Protein, UA: NEGATIVE
Spec Grav, UA: >=1.03
Urobilinogen, UA: 0.2
pH, UA: 5

## 2011-03-29 LAB — LIPID PANEL
Cholesterol: 213 mg/dL — ABNORMAL HIGH (ref 0–200)
HDL: 57 mg/dL (ref >39)
LDL Cholesterol: 137 mg/dL — ABNORMAL HIGH (ref 0–99)
Total CHOL/HDL Ratio: 3.7 Ratio
Triglycerides: 95 mg/dL (ref <150)
VLDL: 19 mg/dL (ref 0–40)

## 2011-03-29 LAB — IFOBT (OCCULT BLOOD): IFOBT: NEGATIVE

## 2011-03-29 LAB — TSH: TSH: 1.546 u[IU]/mL (ref 0.350–4.500)

## 2011-03-29 MED ORDER — TOLTERODINE TARTRATE 2 MG PO TABS: 2.0000 mg | ORAL_TABLET | Freq: Two times a day (BID) | ORAL | Status: DC

## 2011-03-29 NOTE — Progress Notes (Signed)
Is a 51 year old woman works at: Comes in for complete physical. She has several complaints:  1 persistent postural dizziness  2 left-sided abdominal pain which is intermittent and better on the Nexium  3 decreasing vision-hazy vision, floaters left eye  4 dyspnea on exertion which occurs after half a flight of stairs and is new. 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 No night sweats, weight loss, loss of appetite  6 urgency incontinence-desiring refill on Detrol  Past medical history, family history, social history reviewed this visit  Objective: No acute distress, cooperative and in good spirits  HEENT: Unremarkable with normal oropharynx (significant dental work), normal TMs, normal extraocular movement, normal fundi  Neck: No thyromegaly adenopathy, supple  Chest: Clear to auscultation  Heart: Regular no murmur or gallop  Breast exam normal:  Abdomen: Soft nontender without HSM  Genitalia: Normal external female genitalia, moderate yellow vaginal discharge, cervix normal, bimanual normal, rectal: No masses or hemorrhoids  Extremities: Good distal pulses no edema, normal range of motion  [facility] reading (PRIMARY) by  Dr. Milus Glazier  CXR;  Diffuse scarring, particularly right mid lung zone  A: Postural dizziness seems about the same if not slightly better than had been. No no action on this front.  Left-sided abdominal pain also seems better on the Nexium and so I do not plan on doing anything further here.  The decreasing vision in her eyes I think it is mild and because her visual acuity is normal today is related to aging and is not something that can be fixed.  The dyspnea on exertion and some family very concerned about in particular because of the abnormal chest x-ray in the positive PPD in the past. This will need further investigation with a  CT scan  The urgency if she experiences is probably best control of the Detrol which she's already tried and had no problems with.  Plan: We will be getting a CT scan of the lungs, performing usual routine labs for an annual exam soon. She will probably need to have a pulmonary referral. She plans on having a mammogram today.

## 2011-03-29 NOTE — Progress Notes (Signed)
DtaP done at hospital - up to date Last mammogram - 2011 Flu shot at hospital in 2012 Pap will be done today

## 2011-03-30 ENCOUNTER — Ambulatory Visit
Admission: RE | Admit: 2011-03-30 | Discharge: 2011-03-30 | Disposition: A | Discharge status: Home / Self Care | Payer: 59 | Source: Ambulatory Visit | Attending: Family Medicine | Admitting: Family Medicine

## 2011-03-30 MED ORDER — IOHEXOL 300 MG/ML  SOLN
75.0000 mL | Freq: Once | INTRAMUSCULAR | Status: AC | PRN
  Administered 2011-03-30: 75 mL via INTRAVENOUS

## 2011-03-31 ENCOUNTER — Other Ambulatory Visit: Payer: Self-pay | Admitting: Family Medicine

## 2011-03-31 DIAGNOSIS — R0600 Dyspnea, unspecified: Secondary | ICD-10-CM

## 2011-04-02 LAB — PAP IG (IMAGE GUIDED)

## 2011-04-10 ENCOUNTER — Other Ambulatory Visit: Payer: Self-pay | Admitting: Family Medicine

## 2011-04-10 DIAGNOSIS — R7612 Nonspecific reaction to cell mediated immunity measurement of gamma interferon antigen response without active tuberculosis: Secondary | ICD-10-CM

## 2011-04-12 ENCOUNTER — Other Ambulatory Visit: Payer: Self-pay | Admitting: Family Medicine

## 2011-04-12 LAB — QUANTIFERON TB GOLD ASSAY (BLOOD): Interferon Gamma Release Assay: POSITIVE — AB

## 2011-04-12 NOTE — Progress Notes (Signed)
Addended by: Elvina Sidle on: 04/12/2011 06:36 PM   Modules accepted: Orders

## 2011-04-20 ENCOUNTER — Encounter: Payer: Self-pay | Admitting: Internal Medicine

## 2011-04-20 ENCOUNTER — Ambulatory Visit (INDEPENDENT_AMBULATORY_CARE_PROVIDER_SITE_OTHER): Payer: 59 | Admitting: Internal Medicine

## 2011-04-20 VITALS — BP 112/80 | HR 61 | Temp 97.9°F | Ht 64.0 in | Wt 232.0 lb

## 2011-04-20 DIAGNOSIS — R079 Chest pain, unspecified: Secondary | ICD-10-CM

## 2011-04-20 DIAGNOSIS — R0989 Other specified symptoms and signs involving the circulatory and respiratory systems: Secondary | ICD-10-CM

## 2011-04-20 DIAGNOSIS — R9389 Abnormal findings on diagnostic imaging of other specified body structures: Secondary | ICD-10-CM

## 2011-04-20 DIAGNOSIS — R0600 Dyspnea, unspecified: Secondary | ICD-10-CM

## 2011-04-20 DIAGNOSIS — R918 Other nonspecific abnormal finding of lung field: Secondary | ICD-10-CM

## 2011-04-20 NOTE — Progress Notes (Signed)
  Subjective:    Patient ID: Brianna Hawkins, female    DOB: 1960-12-07   MRN: [id number]  HPI  73 yobf light smoker quit around 2000 referred by Faustino Congress 04/20/2011 to pulmonary clinic for abn cxr.   04/20/2011 1st pulmonary cc indolent onset R UQ abd/ CP x one year indolent onset positional in nature goes away when lies on L side assoc with increased flatus then had a routine cxr shows abn rul assoc with sob /wt gain x 30 lbs  bending over and one flight steps. Also cp R ant x sev years only with deep breath.   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  Sleeping ok without nocturnal  or early am exacerbation  of respiratory  c/o's or need for noct saba. Also denies any obvious fluctuation of symptoms with weather or environmental changes or other aggravating or alleviating factors except as outlined above    Review of Systems  Constitutional: Negative for fever, chills and unexpected weight change.  HENT: Negative for ear pain, nosebleeds, congestion, sore throat, rhinorrhea, sneezing, trouble swallowing, dental problem, voice change, postnasal drip and sinus pressure.   Eyes: Negative for visual disturbance.  Respiratory: Positive for shortness of breath. Negative for cough and choking.   Cardiovascular: Positive for chest pain. Negative for leg swelling.  Gastrointestinal: Negative for vomiting, abdominal pain and diarrhea.  Genitourinary: Negative for difficulty urinating.  Musculoskeletal: Positive for arthralgias.  Skin: Negative for rash.  Neurological: Negative for tremors, syncope and headaches.  Hematological: Does not bruise/bleed easily.       Objective:   Physical Exam Wt 232 04/20/2011 HEENT mild turbinate edema.  Oropharynx no thrush or excess pnd or cobblestoning.  No JVD or cervical adenopathy. Mild accessory muscle hypertrophy. Trachea midline, nl thryroid. Chest was min hyperinflated by percussion with slt  diminished breath sounds and min increased exp time without wheeze. Hoover sign positive at very end of  inspiration. Regular rate and rhythm without murmur gallop or rub or increase P2 or edema.  Abd: no hsm, nl excursion. Ext warm without cyanosis or clubbing.    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 This suggests  obstruction of the bronchus, which can be related to scarring from  prior infection, congenital atresia or underlying tumor. At this  time, no centrally obstructing neoplasm is confidently identified,  suggesting against underlying neoplasm. Additional nodularity  throughout the right upper lobe (detailed above) is favored to be  of infectious or inflammatory etiology. However, overall  appearance is nonspecific, and attention on a short term follow-up  chest CT 2-3 months is recommended to ensure stability or  resolution of these findings.  2. 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       Assessment & Plan:

## 2011-04-20 NOTE — Patient Instructions (Signed)
Treatment consists of avoiding foods that cause gas (especially beans and raw vegetables like spinach and salads boiled eggs)  and citrucel 1 heaping tsp twice daily with a large glass of water or juice.  Pain should improve w/in 2 weeks and if not then consider further GI work up.      Please see patient coordinator before you leave today  to sign release for records from the health department   Please schedule a follow up office visit in 6 weeks, call sooner if needed pft's and cxr's

## 2011-04-21 DIAGNOSIS — R079 Chest pain, unspecified: Secondary | ICD-10-CM | POA: Insufficient documentation

## 2011-04-21 DIAGNOSIS — R0600 Dyspnea, unspecified: Secondary | ICD-10-CM | POA: Insufficient documentation

## 2011-04-21 DIAGNOSIS — R9389 Abnormal findings on diagnostic imaging of other specified body structures: Secondary | ICD-10-CM | POA: Insufficient documentation

## 2011-04-21 NOTE — Assessment & Plan Note (Signed)
  When respiratory symptoms begin or become refractory well after a patient reports complete smoking cessation,  Especially when this wasn't the case while they were smoking, a red flag is raised based on the work of Dr Primitivo Gauze which states:  if you quit smoking when your best day FEV1 is still well preserved it is highly unlikely you will progress to severe disease.  That is to say, once the smoking stops,  the symptoms should not suddenly erupt or markedly worsen.  If so, the differential diagnosis should include  obesity/deconditioning,  LPR/Reflux/Aspiration syndromes,  occult CHF, or  especially side effect of medications commonly used in this population.    Obesity/ wt gain is probably the greatest factor here though there is evidence of air trapping on ct chest  Rec PFTs no rx for now x wt loss

## 2011-04-21 NOTE — Assessment & Plan Note (Signed)
Likely scarring from TB - old records requested.

## 2011-04-21 NOTE — Assessment & Plan Note (Signed)
She has two patterns of cp x years, one upper anterior and somewhat pleuritic, the other RUQ positional > pleuritic and more typical of IBS though note she has marked air trapping suggested in RLL  By ct.  She likely does have some form of residual from TB in RUL that appear to be unchanged from 2010 - have requested records from health department.  In meantime try citrucel and avoid gas producing foods x 2 weeks minimum to see what if any improvement in her chronic daily symptoms.

## 2011-05-02 ENCOUNTER — Telehealth: Payer: Self-pay | Admitting: Internal Medicine

## 2011-05-02 NOTE — Telephone Encounter (Signed)
Received copies from [employer] of public health ,on 05/02/11 . Forwarded 8 pages to Dr. Leota Jacobsen review.

## 2011-05-04 ENCOUNTER — Encounter: Payer: Self-pay | Admitting: Internal Medicine

## 2011-05-29 ENCOUNTER — Telehealth: Payer: Self-pay

## 2011-05-29 NOTE — Telephone Encounter (Signed)
Pt is calling to say that the blood pressure medication she is taking is making her groggy would like to know if we can prescribe her something else if possible  [facility]

## 2011-05-30 NOTE — Telephone Encounter (Signed)
Can we change her bp med?

## 2011-05-30 NOTE — Telephone Encounter (Signed)
Spoke with patient and let her know that she needs to rtc to get bp meds changed.

## 2011-05-30 NOTE — Telephone Encounter (Signed)
Please advise the patient to RTC

## 2011-05-30 NOTE — Telephone Encounter (Signed)
LMOM to call back

## 2011-06-12 ENCOUNTER — Other Ambulatory Visit: Payer: Self-pay | Admitting: Internal Medicine

## 2011-06-12 DIAGNOSIS — R9389 Abnormal findings on diagnostic imaging of other specified body structures: Secondary | ICD-10-CM

## 2011-06-13 ENCOUNTER — Encounter: Payer: Self-pay | Admitting: Internal Medicine

## 2011-06-13 ENCOUNTER — Telehealth: Payer: Self-pay | Admitting: Internal Medicine

## 2011-06-13 ENCOUNTER — Ambulatory Visit (INDEPENDENT_AMBULATORY_CARE_PROVIDER_SITE_OTHER)
Admission: RE | Admit: 2011-06-13 | Discharge: 2011-06-13 | Disposition: A | Discharge status: Home / Self Care | Payer: 59 | Source: Ambulatory Visit | Attending: Internal Medicine | Admitting: Internal Medicine

## 2011-06-13 ENCOUNTER — Ambulatory Visit (INDEPENDENT_AMBULATORY_CARE_PROVIDER_SITE_OTHER): Payer: 59 | Admitting: Internal Medicine

## 2011-06-13 VITALS — BP 142/86 | HR 56 | Temp 98.5°F | Ht 64.0 in | Wt 230.0 lb

## 2011-06-13 DIAGNOSIS — R9389 Abnormal findings on diagnostic imaging of other specified body structures: Secondary | ICD-10-CM

## 2011-06-13 DIAGNOSIS — R918 Other nonspecific abnormal finding of lung field: Secondary | ICD-10-CM

## 2011-06-13 DIAGNOSIS — R0600 Dyspnea, unspecified: Secondary | ICD-10-CM

## 2011-06-13 DIAGNOSIS — R0989 Other specified symptoms and signs involving the circulatory and respiratory systems: Secondary | ICD-10-CM

## 2011-06-13 DIAGNOSIS — R079 Chest pain, unspecified: Secondary | ICD-10-CM

## 2011-06-13 DIAGNOSIS — R0609 Other forms of dyspnea: Secondary | ICD-10-CM

## 2011-06-13 LAB — PULMONARY FUNCTION TEST

## 2011-06-13 NOTE — Progress Notes (Addendum)
Subjective:    Patient ID: Brianna Hawkins, female    DOB: [birth year]   MRN: [id number]  HPI  43 yobf light smoker quit around 2000 referred by Faustino Congress 04/20/2011 to pulmonary clinic for abn cxr.   04/20/2011 1st pulmonary cc indolent onset RUQ abd/ CP x one year indolent onset positional in nature goes away when lies on L side assoc with increased flatus then had a routine cxr shows abn rul assoc with sob /wt gain x 30 lbs  bending over and one flight steps. Also cp R ant x sev years only with deep breath.   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 rec Treatment consists of avoiding foods that cause gas (especially beans and raw vegetables like spinach and salads boiled eggs)  and citrucel 1 heaping tsp twice daily with a large glass of water or juice.  Pain should improve w/in 2 weeks and if not then consider further GI work up.      Please see patient coordinator before you leave today  to sign release for records from the health department >  Not available    06/13/2011 f/u ov/Treyvon Blahut cc cp worse on L than R and positional and better on citrucel overall improved. No cough, no limiting sob or active sinus or reflux symptoms  Sleeping ok without nocturnal  or early am exacerbation  of respiratory  c/o's or need for noct saba. Also denies any obvious fluctuation of symptoms with weather or environmental changes or other aggravating or alleviating factors except as outlined above.  ROS  At present neg for  any significant sore throat, dysphagia, dental problems, itching, sneezing,  nasal congestion or excess/ purulent secretions, ear ache,   fever, chills, sweats, unintended wt loss, pleuritic or exertional cp, hemoptysis, palpitations, orthopnea pnd or leg swelling.  Also denies presyncope, palpitations, heartburn,  anorexia, nausea, vomiting, diarrhea  or change in bowel or urinary habits, change in stools or urine, dysuria,hematuria,   rash, arthralgias, visual complaints, headache, numbness weakness or ataxia or problems with walking or coordination. No noted change in mood/affect or memory.             Objective:   Physical Exam Wt 232 04/20/2011 > 230 06/13/2011  HEENT mild turbinate edema.  Oropharynx no thrush or excess pnd or cobblestoning.  No JVD or cervical adenopathy. Mild accessory muscle hypertrophy. Trachea midline, nl thryroid. Chest was min hyperinflated by percussion with slt diminished breath sounds and min increased exp time without wheeze. Hoover sign positive at very end of  inspiration. Regular rate and rhythm without murmur gallop or rub or increase P2 or edema.  Abd: no hsm, nl excursion. Ext warm without cyanosis or clubbing.    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 This suggests  obstruction of the bronchus, which can be related to scarring from  prior infection, congenital atresia or underlying tumor. At this  time, no centrally obstructing neoplasm is confidently identified,  suggesting against underlying neoplasm. Additional nodularity  throughout the right upper lobe (detailed above) is favored to be  of infectious or inflammatory etiology. However, overall  appearance is nonspecific, and attention on a short term follow-up  chest CT 2-3 months is recommended to ensure stability or  resolution of these findings.  2. 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  06/13/11 cxr Chronic volume loss in right upper lobe with elevation of minor  fissure.  Persistent atelectasis or thickening at minor fissure with stable  medial right upper lobe suprahilar density and interval increase in  size of a small nodular focus at the lateral aspect of the right  upper lobe, now 6 mm diameter, previously 4 mm.  This nodular area however corresponds with a nodular focus seen on  CT.      Assessment & Plan:

## 2011-06-13 NOTE — Telephone Encounter (Signed)
Brianna Hawkins was calling with CXR results as follows: Call pt: Reviewed cxr and no acute change but radiology recommends a final f/u at 6 months and this seems reasonable to me  (placed in tickle file)  pt advised.Carron Curie, CMA

## 2011-06-13 NOTE — Patient Instructions (Addendum)
Continue to take the citrucel one tsp twice daily and let me know if the pains worsen at all over time   If you are satisfied with your treatment plan let your doctor know    If in any way you are not 100% satisfied,  please tell us.  If 100% better, tell your friends!

## 2011-06-13 NOTE — Progress Notes (Signed)
PFT done today. 

## 2011-06-17 NOTE — Assessment & Plan Note (Signed)
-   PFT's 06/13/2011 no significant airflow obstruction, dlco 59 corrects to 121   No evidence of sign copd, likely this is obesity/ deconditioning related, reviewed

## 2011-06-17 NOTE — Assessment & Plan Note (Addendum)
L > R, positional, improving on rx with diet and citrucel >   most c/w ibs > rx citrucel, f/u prn

## 2011-06-17 NOTE — Assessment & Plan Note (Addendum)
-   S/p rx for TB ? 2008  by [facility] with 3x4 cm density RUL 02/11/2006 cxr report on DOT for M TB  ([place])  No evidence of active dz or correlation with her L > R abd pain improving on citrucel so likely a form of ibs (discussed separately)  rec cxr at 3 months, placed in tickle file, then yearly

## 2011-06-23 ENCOUNTER — Ambulatory Visit (INDEPENDENT_AMBULATORY_CARE_PROVIDER_SITE_OTHER): Payer: 59 | Admitting: Family Medicine

## 2011-06-23 VITALS — BP 148/98 | HR 71 | Temp 98.1°F | Resp 16 | Ht 65.5 in | Wt 232.4 lb

## 2011-06-23 DIAGNOSIS — Z733 Stress, not elsewhere classified: Secondary | ICD-10-CM

## 2011-06-23 DIAGNOSIS — E663 Overweight: Secondary | ICD-10-CM

## 2011-06-23 DIAGNOSIS — I10 Essential (primary) hypertension: Secondary | ICD-10-CM

## 2011-06-23 DIAGNOSIS — F439 Reaction to severe stress, unspecified: Secondary | ICD-10-CM

## 2011-06-23 DIAGNOSIS — G43909 Migraine, unspecified, not intractable, without status migrainosus: Secondary | ICD-10-CM

## 2011-06-23 NOTE — Progress Notes (Signed)
51 yo with hypertension, migraines, and abnormal CT scan of lungs.  Dr. Sherene Sires believes all the lung problems are related to old TB.  No active problem.  She is under a lot of stress:  Job, recent traffic ticket, son in jail, mother cannot walk well, step-daughters  The new blood  Pressure medicine is making her very tired  Recent episode of migraine with vertical diplopia and dizziness.  This started on Tuesday night 9 pm, with vision clearing by Wednesday.  Headache eased off Thursday, but lingered until today.  She takes excedrin migraine.  Associated with n,v.  O:  Alert, NAD Neck:  Supple CN  III-XII:  Intact Normal appearance Chest: clear Heart; reg  Assessment:  Stress, migraines, overactive bladder, hypertension  Plan:  buspar 10 qd prn Ketoprofen 50 prn Increase the detrol to 4 mg qd Change inderal to amlodipine 10 qd.  Recheck in one month,sooner if symptoms not well controlled

## 2011-07-03 ENCOUNTER — Ambulatory Visit (INDEPENDENT_AMBULATORY_CARE_PROVIDER_SITE_OTHER): Payer: 59 | Admitting: Physician Assistant

## 2011-07-03 ENCOUNTER — Encounter: Payer: Self-pay | Admitting: Physician Assistant

## 2011-07-03 VITALS — BP 120/76 | HR 74 | Temp 98.5°F | Resp 16 | Ht 66.5 in | Wt 233.0 lb

## 2011-07-03 DIAGNOSIS — B9689 Other specified bacterial agents as the cause of diseases classified elsewhere: Secondary | ICD-10-CM

## 2011-07-03 DIAGNOSIS — N898 Other specified noninflammatory disorders of vagina: Secondary | ICD-10-CM

## 2011-07-03 DIAGNOSIS — N760 Acute vaginitis: Secondary | ICD-10-CM

## 2011-07-03 LAB — POCT WET PREP WITH KOH
KOH Prep POC: POSITIVE
Trichomonas, UA: NEGATIVE
Yeast Wet Prep HPF POC: NEGATIVE

## 2011-07-03 MED ORDER — FLUCONAZOLE 150 MG PO TABS: 150.0000 mg | ORAL_TABLET | Freq: Once | ORAL | Status: AC

## 2011-07-03 MED ORDER — METRONIDAZOLE 500 MG PO TABS: 500.0000 mg | ORAL_TABLET | Freq: Two times a day (BID) | ORAL | Status: AC

## 2011-07-03 NOTE — Patient Instructions (Signed)
Try daily probiotics  D/C tampons

## 2011-07-03 NOTE — Progress Notes (Signed)
  Subjective:    Patient ID: Brianna Hawkins, female    DOB: 1960-02-13, 51 y.o.   MRN: [id number]  HPI Patient presents with 2 week history of vaginal discharge, odor, itching, and burning irritation. Has a history of similar infections in the past. Seems to have recurrent yeast and bacterial infections and cannot figure out why this keeps happening. After last visit she tried using Boric Acid suppositories after intercourse but that caused spotting so she discontinued use. Also tried condoms which she says did seem to prevent recurrence, but she does not like using them so she has stopped that as well.  Denies any concern for STD's. She is sexually active with 1 female partner.     Review of Systems  All other systems reviewed and are negative.       Objective:   Physical Exam  Constitutional: She is oriented to person, place, and time. She appears well-developed and well-nourished.  HENT:  Head: Normocephalic and atraumatic.  Right Ear: External ear normal.  Left Ear: External ear normal.  Eyes: Conjunctivae are normal.  Neck: Normal range of motion.  Cardiovascular: Normal rate, regular rhythm and normal heart sounds.   Pulmonary/Chest: Effort normal and breath sounds normal.  Abdominal: Soft. Bowel sounds are normal. There is no tenderness.  Genitourinary: Uterus normal. Pelvic exam was performed with patient supine. There is no rash on the right labia. There is no rash on the left labia. Cervix exhibits discharge (minimal thin, bloody discharge). Cervix exhibits no motion tenderness. Right adnexum displays no tenderness. Left adnexum displays no tenderness. There is erythema (and irritation ) around the vagina.  Musculoskeletal: Normal range of motion.  Neurological: She is alert and oriented to person, place, and time.  Psychiatric: She has a normal mood and affect. Her behavior is normal. Judgment and thought content normal.          Assessment & Plan:   1. Vaginal  leukorrhea  Will treat with flagyl  Diflucan given to use after completion of flagyl Recommend starting daily probiotics and discontinue use of tampons.  POCT Wet Prep with KOH, metroNIDAZOLE (FLAGYL) 500 MG tablet, fluconazole (DIFLUCAN) 150 MG tablet   FMLA paperwork given to [facility] for completion.

## 2011-07-24 ENCOUNTER — Telehealth: Payer: Self-pay

## 2011-07-24 NOTE — Telephone Encounter (Signed)
Pt called concerned about blood pressure and dizziness she is experiencing today Wants to know is there is a medication she can take

## 2011-07-26 NOTE — Telephone Encounter (Signed)
There is medication. Patient should return to clinic for evaluation to determine source of dizziness and BP check. LMOM for CB.

## 2011-07-27 NOTE — Telephone Encounter (Signed)
LMOM TO CB 

## 2011-07-28 NOTE — Telephone Encounter (Signed)
Sunday PT USED A CLEANSING SYSTEM AND THEN ATE SOME POTTED MEAT AND Monday SHE WOKE UP FEELING DIZZY.  SHE TOOK BP MED AND AROUND 1 PM SHE FELT BETTER.

## 2011-07-28 NOTE — Telephone Encounter (Signed)
LMOM TO CB 

## 2011-07-30 ENCOUNTER — Ambulatory Visit (INDEPENDENT_AMBULATORY_CARE_PROVIDER_SITE_OTHER): Payer: 59 | Admitting: Internal Medicine

## 2011-07-30 VITALS — BP 124/74 | HR 72 | Temp 98.5°F | Resp 16 | Ht 68.0 in | Wt 230.0 lb

## 2011-07-30 DIAGNOSIS — N3281 Overactive bladder: Secondary | ICD-10-CM

## 2011-07-30 DIAGNOSIS — I10 Essential (primary) hypertension: Secondary | ICD-10-CM

## 2011-07-30 DIAGNOSIS — K5900 Constipation, unspecified: Secondary | ICD-10-CM

## 2011-07-30 DIAGNOSIS — Z6834 Body mass index (BMI) 34.0-34.9, adult: Secondary | ICD-10-CM

## 2011-07-30 DIAGNOSIS — N318 Other neuromuscular dysfunction of bladder: Secondary | ICD-10-CM

## 2011-07-30 MED ORDER — PEG 3350-KCL-NABCB-NACL-NASULF 236 G PO SOLR: 4.0000 L | Freq: Once | ORAL | Status: AC

## 2011-07-30 MED ORDER — TOLTERODINE TARTRATE ER 4 MG PO CP24: 4.0000 mg | ORAL_CAPSULE | Freq: Every day | ORAL | Status: DC

## 2011-07-31 NOTE — Progress Notes (Signed)
  Subjective:    Patient ID: Brianna Hawkins, female    DOB: Jun 30, 1960, 51 y.o.   MRN: [id number]  HPINo bowel movement for the last 8 days One dose MiraLax 5 days ago plus enema equals no results Mild cramping/loss of appetite/feels lightheaded No fever or belly pain No urinary symptoms No rectal bleeding Has a history of chronic constipation and this problem recurs about every 4 months She often needs big volumes of GoLYTELY She did occurred she 8 days ago with an oral solution plus 2 Dulcolax suppositories/no BM since   Patient Active Problem List  Diagnosis  . Headache  . Hypertension  . Vertigo, benign positional  . Insomnia  . Chest pain  . Abnormal CXR  . Dyspnea    -  GERD  Current outpatient prescriptions:amLODipine (NORVASC) 10 MG tablet, Take 10 mg by mouth daily., Disp: , Rfl: ;  esomeprazole (NEXIUM) 40 MG capsule, Take 40 mg by mouth daily., Disp: , Rfl: ;  tolterodine (DETROL LA) 4 MG 24 hr capsule, Take 1 capsule (4 mg total) by mouth daily., Disp: 30 capsule, Rfl: 5;  mirtazapine (REMERON) 7.5 MG tablet, Take 1 tablet (7.5 mg total) by mouth at bedtime., Disp: 90 tablet, Rfl: 3 No change in medications  Review of Systems No other symptoms outside of present illness    Objective:   Physical Exam No acute distress Vital signs stable Chest clear Abdomen soft nontender and nondistended without masses or organomegaly Rectal deferred       Assessment & Plan:  Problem #1 chronic and recurrent constipation Problem #2 urinary frequency-needs refill of Detrol LA Problem #3 hypertension-needs refill of Norvasc Meds ordered this encounter  Medications  . amLODipine (NORVASC) 10 MG tablet    Sig: Take 10 mg by mouth daily.   MiraLax daily      . polyethylene glycol (GOLYTELY) 236 G solution    Sig: Take 4,000 mLs by mouth once.    Dispense:  4000 mL    Refill:  0  . tolterodine (DETROL LA) 4 MG 24 hr capsule    Sig: Take 1 capsule (4 mg total) by mouth  daily.    Dispense:  30 capsule    Refill:  5  Recheck in 48-72 hours Consider GI consult again

## 2011-08-02 DIAGNOSIS — Z6834 Body mass index (BMI) 34.0-34.9, adult: Secondary | ICD-10-CM | POA: Insufficient documentation

## 2011-08-03 ENCOUNTER — Telehealth: Payer: Self-pay

## 2011-08-03 DIAGNOSIS — R198 Other specified symptoms and signs involving the digestive system and abdomen: Secondary | ICD-10-CM

## 2011-08-03 NOTE — Telephone Encounter (Signed)
See message, pt asking to proceed with referral, to GI, do you have one in mind to refer ? Please advise

## 2011-08-03 NOTE — Telephone Encounter (Signed)
Patient saw Brianna Hawkins on Monday because she was unable to have bowel movement. She drank the medication he gave her and had a BM Monday, but none since, even after eating all week. Brianna Hawkins advised he would refer her for endoscopy if she wanted, and she is now wanting referral. Please call patient at [phone] to advise of referral status.

## 2011-08-09 ENCOUNTER — Encounter: Payer: Self-pay | Admitting: Gastroenterology

## 2011-08-09 ENCOUNTER — Other Ambulatory Visit: Payer: 59

## 2011-08-09 ENCOUNTER — Ambulatory Visit (INDEPENDENT_AMBULATORY_CARE_PROVIDER_SITE_OTHER): Payer: 59 | Admitting: Gastroenterology

## 2011-08-09 VITALS — BP 110/78 | HR 68 | Ht 64.0 in | Wt 230.2 lb

## 2011-08-09 DIAGNOSIS — Z800 Family history of malignant neoplasm of digestive organs: Secondary | ICD-10-CM

## 2011-08-09 DIAGNOSIS — K219 Gastro-esophageal reflux disease without esophagitis: Secondary | ICD-10-CM

## 2011-08-09 DIAGNOSIS — R1310 Dysphagia, unspecified: Secondary | ICD-10-CM

## 2011-08-09 DIAGNOSIS — K5900 Constipation, unspecified: Secondary | ICD-10-CM

## 2011-08-09 MED ORDER — MOVIPREP 100 G PO SOLR: ORAL | Status: DC

## 2011-08-09 NOTE — Patient Instructions (Addendum)
You have been scheduled for an endoscopy and colonoscopy with propofol. Please follow the written instructions given to you at your visit today. Please pick up your prep at the pharmacy within the next 1-3 days. If you use inhalers (even only as needed), please bring them with you on the day of your procedure. Your physician has requested that you go to the basement for the following lab work before leaving today: Celiac 10 Panel We have given you information on constipation, a high fiber diet and GERD diet to follow. Cc: Dr Elvina Sidle

## 2011-08-09 NOTE — Progress Notes (Signed)
History of Present Illness:  This is a 51 year old African American female who experienced acute constipation 2 weeks ago after her Detrol medication was increased from 2-4 mg a day for her spastic bladder syndrome. She was placed on MiraLax and had mild improvement, and subsequently used magnesium citrate with good response. She currently is having regular bowel movements without melena or hematochezia. The patient describes a variety of food intolerances, and is on a very low fiber meat free diet. She denies chronic abdominal pain, gas, bloating, history of hepatitis, pancreatitis, anorexia or weight loss. She has chronic GERD and is on Nexium 40 mg a day. She describes intermittent solid food dysphagia, and apparently is not had previous endoscopic evaluations or x-rays. She does have lactose intolerance. His of note to her father died at age 53 of colon cancer. She is on medications for hypertension.  I have reviewed this patient's present history, medical and surgical past history, allergies and medications.     ROS: The remainder of the 10 point ROS is negative     Physical Exam: Blood pressure 110/78, pulse 60 and regular, weight 230 pounds the BMI of 39.51. I cannot appreciate stigmata of chronic liver disease. General well developed well nourished patient in no acute distress, appearing her stated age Eyes PERRLA, no icterus, fundoscopic exam per opthamologist Skin no lesions noted Neck supple, no adenopathy, no thyroid enlargement, no tenderness Chest clear to percussion and auscultation Heart no significant murmurs, gallops or rubs noted Abdomen no hepatosplenomegaly masses or tenderness, BS normal.  Rectal refused by patient Extremities no acute joint lesions, edema, phlebitis or evidence of cellulitis. Neurologic patient oriented x 3, cranial nerves intact, no focal neurologic deficits noted. Psychological mental status normal and normal affect.  Assessment and plan: Acute  constipation related anticholinergic medication use which seems to have resolved with adequate by mouth fluids. She has a strong family history of colon cancer, and we have scheduled outpatient colonoscopy at her convenience. I have asked her to try of high fiber foods as tolerated with liberal by mouth fluids. Also because of her chronic GERD and dysphagia, we will proceed with endoscopic exam. Celiac profile ordered today.  Encounter Diagnoses  Name Primary?  . Family history of colon cancer Yes  . GERD (gastroesophageal reflux disease)   . Dysphagia   . Constipation

## 2011-08-10 LAB — CELIAC PANEL 10
Endomysial Screen: NEGATIVE
Gliadin IgA: 5.7 U/mL (ref <20)
Gliadin IgG: 11.9 U/mL (ref <20)
IgA: 111 mg/dL (ref 69–380)
Tissue Transglut Ab: 6.8 U/mL (ref <20)
Tissue Transglutaminase Ab, IgA: 2.7 U/mL (ref <20)

## 2011-08-31 ENCOUNTER — Telehealth: Payer: Self-pay | Admitting: *Deleted

## 2011-08-31 NOTE — Telephone Encounter (Signed)
Pt due for Sept cxr to f/u abn cxr.  ATC the pt, line was busy x 2 WCB later

## 2011-08-31 NOTE — Telephone Encounter (Signed)
Message copied by Christen Butter on Fri Aug 31, 2011  5:22 PM ------      Message from: Nyoka Cowden      Created: Sun Jun 17, 2011  7:47 AM       cxr need to be repeated this month

## 2011-09-04 NOTE — Telephone Encounter (Signed)
ATC line still busy, WCB 

## 2011-09-05 ENCOUNTER — Ambulatory Visit (AMBULATORY_SURGERY_CENTER): Payer: 59 | Admitting: Gastroenterology

## 2011-09-05 ENCOUNTER — Encounter: Payer: Self-pay | Admitting: Gastroenterology

## 2011-09-05 VITALS — BP 127/90 | HR 71 | Temp 97.4°F | Resp 18 | Ht 64.0 in | Wt 230.0 lb

## 2011-09-05 DIAGNOSIS — R1310 Dysphagia, unspecified: Secondary | ICD-10-CM

## 2011-09-05 DIAGNOSIS — K5900 Constipation, unspecified: Secondary | ICD-10-CM

## 2011-09-05 DIAGNOSIS — Z800 Family history of malignant neoplasm of digestive organs: Secondary | ICD-10-CM

## 2011-09-05 DIAGNOSIS — Z1211 Encounter for screening for malignant neoplasm of colon: Secondary | ICD-10-CM

## 2011-09-05 DIAGNOSIS — K219 Gastro-esophageal reflux disease without esophagitis: Secondary | ICD-10-CM

## 2011-09-05 DIAGNOSIS — R079 Chest pain, unspecified: Secondary | ICD-10-CM

## 2011-09-05 HISTORY — PX: COLONOSCOPY WITH ESOPHAGOGASTRODUODENOSCOPY (EGD): SHX5779

## 2011-09-05 MED ORDER — SODIUM CHLORIDE 0.9 % IV SOLN: 500.0000 mL | INTRAVENOUS | Status: DC

## 2011-09-05 NOTE — Progress Notes (Signed)
Patient did not experience any of the following events: a burn prior to discharge; a fall within the facility; wrong site/side/patient/procedure/implant event; or a hospital transfer or hospital admission upon discharge from the facility. (G8907) Patient did not have preoperative order for IV antibiotic SSI prophylaxis. (G8918)  

## 2011-09-05 NOTE — Patient Instructions (Addendum)
YOU HAD AN ENDOSCOPIC PROCEDURE TODAY AT THE [facility]: Refer to the procedure report that was given to you for any specific questions about what was found during the examination.  If the procedure report does not answer your questions, please call your gastroenterologist to clarify.  If you requested that your care partner not be given the details of your procedure findings, then the procedure report has been included in a sealed envelope for you to review at your convenience later.  YOU SHOULD EXPECT: Some feelings of bloating in the abdomen. Passage of more gas than usual.  Walking can help get rid of the air that was put into your GI tract during the procedure and reduce the bloating. If you had a lower endoscopy (such as a colonoscopy or flexible sigmoidoscopy) you may notice spotting of blood in your stool or on the toilet paper. If you underwent a bowel prep for your procedure, then you may not have a normal bowel movement for a few days.  DIET: DILATION DIET TODAY AS INSTRUCTED. MAY RESUME YOUR REGULAR DIET IN THE MORNING.  Drink plenty of fluids but you should avoid alcoholic beverages for 24 hours.  ACTIVITY: Your care partner should take you home directly after the procedure.  You should plan to take it easy, moving slowly for the rest of the day.  You can resume normal activity the day after the procedure however you should NOT DRIVE or use heavy machinery for 24 hours (because of the sedation medicines used during the test).    SYMPTOMS TO REPORT IMMEDIATELY: A gastroenterologist can be reached at any hour.  During normal business hours, 8:30 AM to 5:00 PM Monday through Friday, call [phone].  After hours and on weekends, please call the GI answering service at [phone] who will take a message and have the physician on call contact you.   Following lower endoscopy (colonoscopy or flexible sigmoidoscopy):  Excessive amounts of blood in the stool  Significant  tenderness or worsening of abdominal pains   Swelling of the abdomen that is new, acute  Fever of 100F or higher  Following upper endoscopy (EGD)  Vomiting of blood or coffee ground material  New chest pain or pain under the shoulder blades  Painful or persistently difficult swallowing  New shortness of breath  Fever of 100F or higher  Black, tarry-looking stools  FOLLOW UP: If any biopsies were taken you will be contacted by phone or by letter within the next 1-3 weeks.  Call your gastroenterologist if you have not heard about the biopsies in 3 weeks.  Our staff will call the home number listed on your records the next business day following your procedure to check on you and address any questions or concerns that you may have at that time regarding the information given to you following your procedure. This is a courtesy call and so if there is no answer at the home number and we have not heard from you through the emergency physician on call, we will assume that you have returned to your regular daily activities without incident.  SIGNATURES/CONFIDENTIALITY: You and/or your care partner have signed paperwork which will be entered into your electronic medical record.  These signatures attest to the fact that that the information above on your After Visit Summary has been reviewed and is understood.  Full responsibility of the confidentiality of this discharge information lies with you and/or your care-partner.   DILATION DIET CONTINUE ALL YOUR CURRENT MEDICINES  MAY USE THE HEMORRHOID CRAM YOU HAVE AT HOME AS NEEDED

## 2011-09-05 NOTE — Progress Notes (Signed)
Pressure applied to the abdomen to reach the cecum 

## 2011-09-05 NOTE — Op Note (Signed)
[facility] [address] [place], [address]   COLONOSCOPY PROCEDURE REPORT  PATIENT: Brianna, Hawkins  MR#: [id number] BIRTHDATE: 1960/07/31 , 50  yrs. old GENDER: Female ENDOSCOPIST: Mardella Layman, MD, [facility] REFERRED BY:  Elvina Sidle, M.D. PROCEDURE DATE:  09/05/2011 PROCEDURE:   Colonoscopy, screening ASA CLASS:   Class II INDICATIONS:average risk patient for colon cancer. MEDICATIONS: propofol (Diprivan) 250mg  IV  DESCRIPTION OF PROCEDURE:   After the risks and benefits and of the procedure were explained, informed consent was obtained.  A digital rectal exam revealed external hemorrhoids.    The LB PCF-H180AL [id number]  endoscope was introduced through the anus and advanced to the cecum, which was identified by both the appendix and ileocecal valve .  The quality of the prep was good, using MoviPrep .  The instrument was then slowly withdrawn as the colon was fully examined.     COLON FINDINGS: The colon was redundant.  Manual abdominal counter-pressure was used to reach the cecum.   The colon was otherwise normal.  There was no diverticulosis, inflamation, polyps or cancers unless previously stated.     Retroflexed views revealed no abnormalities.     The scope was then withdrawn from the patient and the procedure completed.  COMPLICATIONS: There were no complications. ENDOSCOPIC IMPRESSION: 1.   The colon was redundant 2.   The colon was otherwise normal  Continue current colorectal screening recommendations for "routine risk" patients with a repeat colonoscopy in 10 years.   REPEAT EXAM:  cc:  _______________________________ eSignedMardella Layman, MD, [facility] 09/05/2011 11:10 AM

## 2011-09-05 NOTE — Op Note (Signed)
[facility] [address] [place], [address]   ENDOSCOPY PROCEDURE REPORT  PATIENT: Brianna, Hawkins  MR#: [id number] BIRTHDATE: 1960/01/24 , 50  yrs. old GENDER: Female ENDOSCOPIST:David Hale Bogus, MD, [facility] REFERRED BY: PROCEDURE DATE:  09/05/2011 PROCEDURE:   EGD w/ biopsy and Maloney dilation of esophagus ASA CLASS:    Class II INDICATIONS: chronic GERD. MEDICATION: There was residual sedation effect present from prior procedure and propofol (Diprivan) 200mg  IV TOPICAL ANESTHETIC:  DESCRIPTION OF PROCEDURE:   After the risks and benefits of the procedure were explained, informed consent was obtained.  The LB GIF-H180 [id number]  endoscope was introduced through the mouth  and advanced to the second portion of the duodenum .  The instrument was slowly withdrawn as the mucosa was fully examined.      ESOPHAGUS: Reflux esophagitis was found at the gastroesophageal junction.  Esophagitis was LA Grade A: breaks across 1-2 folds, <5 mm.  Multiple biopsies were performed using cold forceps.   The esophagus was otherwise normal.  STOMACH: The mucosa of the stomach appeared normal.  DUODENUM: Normal mucosa,no ulcerations or inflammation notrd.  Maloney dilation.Marland KitchenMarland Kitchen#36F Maloney dilator passed without difficulty. Maloney dilation.Marland KitchenMarland Kitchen#36F Maloney dilator passed without difficulty. Retroflexed views revealed no abnormalities.    The scope was then withdrawn from the patient and the procedure completed.  COMPLICATIONS: There were no complications.   ENDOSCOPIC IMPRESSION: 1.   Esophagitis consistent with reflux esophagitis at the gastroesophageal junction; multiple biopsies 2.   The esophagus was otherwise normal. 3.   The mucosa of the stomach appeared normal 4.   Normal mucosa,no ulcerations or inflammation notrd. 5.   Maloney dilation.Marland KitchenMarland Kitchen#36F Maloney dilator passed without difficulty.  RECOMMENDATIONS: 1.  anti-reflux regimen to be follow 2.  await  pathology results 3.  continue current medications    _______________________________ eSigned:  Mardella Layman, MD, [facility] 09/05/2011 11:21 AM      PATIENT NAME:  Brianna, Hawkins MR#: [id number]

## 2011-09-06 ENCOUNTER — Telehealth: Payer: Self-pay | Admitting: *Deleted

## 2011-09-06 NOTE — Telephone Encounter (Signed)
  Follow up Call-  Call back number 09/05/2011  Post procedure Call Back phone  # [phone]  Permission to leave phone message Yes     Patient questions:  Do you have a fever, pain , or abdominal swelling? no Pain Score  0 *  Have you tolerated food without any problems? yes  Have you been able to return to your normal activities? yes  Do you have any questions about your discharge instructions: Diet   no Medications  no Follow up visit  no  Do you have questions or concerns about your Care? no  Actions: * If pain score is 4 or above: No action needed, pain <4.

## 2011-09-10 ENCOUNTER — Encounter: Payer: Self-pay | Admitting: Gastroenterology

## 2011-09-13 ENCOUNTER — Encounter: Payer: Self-pay | Admitting: *Deleted

## 2011-09-13 NOTE — Telephone Encounter (Signed)
Letter mailed to the pt. 

## 2011-10-10 ENCOUNTER — Other Ambulatory Visit: Payer: Self-pay | Admitting: Family Medicine

## 2011-11-23 ENCOUNTER — Telehealth: Payer: Self-pay | Admitting: *Deleted

## 2011-11-23 NOTE — Telephone Encounter (Signed)
Message copied by Christen Butter on Fri Nov 23, 2011  4:58 PM ------      Message from: Sandrea Hughs B      Created: Wed Jun 13, 2011  2:37 PM       Needs f/u cxr

## 2011-11-23 NOTE — Telephone Encounter (Signed)
Pt needs rov with cxr with MW for mid dec for f/u pulm nodule LMOMTCB

## 2011-11-30 NOTE — Telephone Encounter (Signed)
LMTCB

## 2011-12-03 NOTE — Telephone Encounter (Signed)
LMTCB x 3 

## 2011-12-10 ENCOUNTER — Ambulatory Visit (INDEPENDENT_AMBULATORY_CARE_PROVIDER_SITE_OTHER): Payer: 59 | Admitting: Family Medicine

## 2011-12-10 ENCOUNTER — Ambulatory Visit: Payer: 59

## 2011-12-10 VITALS — BP 143/86 | HR 76 | Temp 98.5°F | Resp 18 | Ht 67.0 in | Wt 230.0 lb

## 2011-12-10 DIAGNOSIS — Z20828 Contact with and (suspected) exposure to other viral communicable diseases: Secondary | ICD-10-CM

## 2011-12-10 DIAGNOSIS — R05 Cough: Secondary | ICD-10-CM

## 2011-12-10 DIAGNOSIS — J189 Pneumonia, unspecified organism: Secondary | ICD-10-CM

## 2011-12-10 LAB — POCT INFLUENZA A/B
Influenza A, POC: NEGATIVE
Influenza B, POC: NEGATIVE

## 2011-12-10 MED ORDER — CEFDINIR 300 MG PO CAPS: 300.0000 mg | ORAL_CAPSULE | Freq: Two times a day (BID) | ORAL | Status: DC

## 2011-12-10 NOTE — Progress Notes (Signed)
[facility] [address] [place] [address] [phone]  Date:  12/10/2011   Name:  Brianna Hawkins   DOB:  27-Mar-1960   MRN:  [id number]  PCP:  Elvina Sidle, MD    Chief Complaint: Nasal Congestion and Chest Pain   History of Present Illness:  Brianna Hawkins is a 51 y.o. very pleasant female patient who presents with the following:  Her son was diagnosed with flu and pneumonia last week.  She has been caring for him.  This past Thursday she noted a scratchy throat.  She then developed a productive cough and watery eyes.  She hurts when she coughs.   She is not aware of a fever.  She has checked her temperature and it has been ok   She is having aches and chills She has had walking pneumonia twice in the past.   No GI symptoms.    She has a history of vertigo and her symptoms have been a bit worse of late.   She has tried tylenol cold and flu, theraflu.    She has felt quite tired.   Her last period was about 2 weeks ago.   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 She quit smoking about 13 years ago.    Patient Active Problem List  Diagnosis  . Headache  . Hypertension  . Vertigo, benign positional  . Insomnia  . Chest pain  . Abnormal CXR  . Dyspnea  . BMI 34.0-34.9,adult    Past Medical History  Diagnosis Date  . Urinary urgency   . Pain, joint, shoulder region, right   . Headache   . Hypertension   . Allergy   . Blood transfusion   . GERD (gastroesophageal reflux disease)   . Heart murmur   . Tuberculosis     7 YEARS AGO  . Blood transfusion without reported diagnosis     Past Surgical History  Procedure Date  . Tubal ligation   . Breast lumpectomy     Underarm    History  Substance Use Topics  . Smoking status: Former Smoker -- 0.3 packs/day for 15 years    Types: Cigarettes    Quit date: 01/01/2001  . Smokeless tobacco: Never Used     Comment: "never inhaled"  . Alcohol Use: No    Family  History  Problem Relation Age of Onset  . Throat cancer Father     was a smoker  . Colon cancer Father   . Dementia Other   . HIV Sister   . Stroke Mother   . Diabetes Paternal Grandmother     No Known Allergies  Medication list has been reviewed and updated.  Current Outpatient Prescriptions on File Prior to Visit  Medication Sig Dispense Refill  . amLODipine (NORVASC) 10 MG tablet TAKE 1 TABLET BY MOUTH EVERY DAY  90 tablet  0  . esomeprazole (NEXIUM) 40 MG capsule Take 40 mg by mouth daily.      . mirtazapine (REMERON) 7.5 MG tablet Take 1 tablet (7.5 mg total) by mouth at bedtime.  90 tablet  3  . tolterodine (DETROL LA) 4 MG 24 hr capsule Take 1 capsule (4 mg total) by mouth daily.  30 capsule  5    Review of Systems:  As per HPI- otherwise negative.   Physical Examination: Filed Vitals:   12/10/11 1115  BP: 143/86  Pulse: 76  Temp: 98.5 F (36.9 C)  Resp: 18  Filed Vitals:   12/10/11 1115  Height: 5\' 7"  (1.702 m)  Weight: 230 lb (104.327 kg)   Body mass index is 36.02 kg/(m^2). Ideal Body Weight: Weight in (lb) to have BMI = 25: 159.3   GEN: WDWN, NAD, Non-toxic, A & O x 3, overweight HEENT: Atraumatic, Normocephalic. Neck supple. No masses, No LAD. Bilateral TM wnl, oropharynx normal.  PEERL,EOMI.   Nasal cavity congested Ears and Nose: No external deformity. CV: RRR, No M/G/R. No JVD. No thrill. No extra heart sounds. PULM: CTA B, no wheezes, crackles, rhonchi. No retractions. No resp. distress. No accessory muscle use. ABD: S, NT, ND, +BS. No rebound. No HSM. EXTR: No c/c/e NEURO Normal gait.  PSYCH: Normally interactive. Conversant. Not depressed or anxious appearing.  Calm demeanor.   [facility] reading (PRIMARY) by  Dr. Patsy Lager. CXR:  Small amount of fluid in right fissure, slight RLL infiltrate.    CHEST - 2 VIEW  Comparison: Chest radiograph 06/12 1013  Findings: Normal cardiac silhouette. There is no pleural parenchymal thickening in the right  suprahilar region unchanged. Peripheral nodularity in the right upper lobe is also unchanged. These findings are similar to comparison chest radiograph as well as the CT. Lateral projection demonstrates no pleural effusion. No aggressive osseous lesions.  IMPRESSION:  Stable pleural parenchymal thickening in the right suprahilar region and peripheral right upper lobe nodularity is unchanged comparison CT chest radiograph.  Results for orders placed in visit on 12/10/11  POCT INFLUENZA A/B      Component Value Range   Influenza A, POC Negative     Influenza B, POC Negative      Assessment and Plan: 1. Cough  POCT Influenza A/B, DG Chest 2 View  2. Exposure to the flu    3. Pneumonia  cefdinir (OMNICEF) 300 MG capsule   Treat for bronchtiis with omnicef.  Rest, fluids, and let me know if not better in the next couple of days  Abbe Amsterdam, MD

## 2011-12-10 NOTE — Patient Instructions (Addendum)
Use the antibiotic as directed and drink plenty of fluids and rest.  If you are not better in the next couple of days please let me know- Sooner if worse.

## 2011-12-11 ENCOUNTER — Encounter: Payer: Self-pay | Admitting: *Deleted

## 2011-12-11 ENCOUNTER — Encounter: Payer: Self-pay | Admitting: Family Medicine

## 2011-12-11 NOTE — Telephone Encounter (Signed)
ATC the pt again, still unable to reach. Will mail her letter to call for appt.

## 2012-01-02 DIAGNOSIS — Z87898 Personal history of other specified conditions: Secondary | ICD-10-CM

## 2012-01-02 HISTORY — DX: Personal history of other specified conditions: Z87.898

## 2012-01-07 ENCOUNTER — Other Ambulatory Visit: Payer: Self-pay | Admitting: Family Medicine

## 2012-01-24 ENCOUNTER — Ambulatory Visit (INDEPENDENT_AMBULATORY_CARE_PROVIDER_SITE_OTHER): Payer: 59 | Admitting: Family Medicine

## 2012-01-24 ENCOUNTER — Encounter: Payer: Self-pay | Admitting: Family Medicine

## 2012-01-24 VITALS — BP 124/74 | HR 76 | Temp 98.4°F | Resp 16 | Ht 65.5 in | Wt 225.6 lb

## 2012-01-24 DIAGNOSIS — R002 Palpitations: Secondary | ICD-10-CM

## 2012-01-24 DIAGNOSIS — R55 Syncope and collapse: Secondary | ICD-10-CM

## 2012-01-24 DIAGNOSIS — R42 Dizziness and giddiness: Secondary | ICD-10-CM

## 2012-01-24 DIAGNOSIS — J329 Chronic sinusitis, unspecified: Secondary | ICD-10-CM

## 2012-01-24 LAB — CBC WITH DIFFERENTIAL/PLATELET
Basophils Absolute: 0 10*3/uL (ref 0.0–0.1)
Basophils Relative: 1 % (ref 0–1)
Eosinophils Absolute: 0.1 10*3/uL (ref 0.0–0.7)
Eosinophils Relative: 2 % (ref 0–5)
HCT: 36 % (ref 36.0–46.0)
Hemoglobin: 12.1 g/dL (ref 12.0–15.0)
Lymphocytes Relative: 25 % (ref 12–46)
Lymphs Abs: 1.4 10*3/uL (ref 0.7–4.0)
MCH: 28.9 pg (ref 26.0–34.0)
MCHC: 33.6 g/dL (ref 30.0–36.0)
MCV: 86.1 fL (ref 78.0–100.0)
Monocytes Absolute: 0.5 10*3/uL (ref 0.1–1.0)
Monocytes Relative: 9 % (ref 3–12)
Neutro Abs: 3.8 10*3/uL (ref 1.7–7.7)
Neutrophils Relative %: 63 % (ref 43–77)
Platelets: 322 10*3/uL (ref 150–400)
RBC: 4.18 MIL/uL (ref 3.87–5.11)
RDW: 14.7 % (ref 11.5–15.5)
WBC: 5.8 10*3/uL (ref 4.0–10.5)

## 2012-01-24 MED ORDER — AMOXICILLIN 875 MG PO TABS: 875.0000 mg | ORAL_TABLET | Freq: Two times a day (BID) | ORAL | Status: DC

## 2012-01-24 NOTE — Progress Notes (Signed)
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  Patient comes in today complaining about dizziness. She's had these episodes since January 8 when she noticed an acute nausea and lightheadedness coming over her. She fell to her knees and the past and several minutes. This was followed by diaphoresis. Since that time she's had multiple other episodes the most recent being yesterday. Most of the episodes of mild yesterday was more dramatic in interfered with her activity. She says at times she does think she is having palpitations.  Patient also complains about sinus congestion which has been ongoing for some time. She's had no fever and no tinnitus.  Objective: Blood pressure is 120/80 both sitting and standing  Patient is in no acute distress, is alert, cooperative and really quite friendly person. HEENT: Mucopurulent discharge both nasal passages, TMs normal, oropharynx clear Neck: Supple no adenopathy or bruits, no thyromegaly Chest: Clear Heart: Regular no murmur, gallop, rub Extremities: No edema  EKG:  NSR  Assessment:  Vasovagal syncope, sinusitis.  In the context of these recent symptoms, I am particularly concerned about the palpitations.  Plan:  1. Vasovagal syncope  Ambulatory referral to Cardiology, EKG 12-Lead, CBC with Differential, Comprehensive metabolic panel, TSH, T4, Free  2. Dizziness  Ambulatory referral to Cardiology, EKG 12-Lead  3. Sinusitis  amoxicillin (AMOXIL) 875 MG tablet

## 2012-01-24 NOTE — Patient Instructions (Signed)
Your symptoms are most consistent with vasovagal syncope  Dizziness Dizziness is a common problem. It is a feeling of unsteadiness or lightheadedness. You may feel like you are about to faint. Dizziness can lead to injury if you stumble or fall. A person of any age group can suffer from dizziness, but dizziness is more common in older adults. CAUSES  Dizziness can be caused by many different things, including:  Middle ear problems.  Standing for too long.  Infections.  An allergic reaction.  Aging.  An emotional response to something, such as the sight of blood.  Side effects of medicines.  Fatigue.  Problems with circulation or blood pressure.  Excess use of alcohol, medicines, or illegal drug use.  Breathing too fast (hyperventilation).  An arrhythmia or problems with your heart rhythm.  Low red blood cell count (anemia).  Pregnancy.  Vomiting, diarrhea, fever, or other illnesses that cause dehydration.  Diseases or conditions such as Parkinson's disease, high blood pressure (hypertension), diabetes, and thyroid problems.  Exposure to extreme heat. DIAGNOSIS  To find the cause of your dizziness, your caregiver may do a physical exam, lab tests, radiologic imaging scans, or an electrocardiography test (ECG).  TREATMENT  Treatment of dizziness depends on the cause of your symptoms and can vary greatly. HOME CARE INSTRUCTIONS   Drink enough fluids to keep your urine clear or pale yellow. This is especially important in very hot weather. In the elderly, it is also important in cold weather.  If your dizziness is caused by medicines, take them exactly as directed. When taking blood pressure medicines, it is especially important to get up slowly.  Rise slowly from chairs and steady yourself until you feel okay.  In the morning, first sit up on the side of the bed. When this seems okay, stand slowly while holding onto something until you know your balance is fine.  If  you need to stand in one place for a long time, be sure to move your legs often. Tighten and relax the muscles in your legs while standing.  If dizziness continues to be a problem, have someone stay with you for a day or two. Do this until you feel you are well enough to stay alone. Have the person call your caregiver if he or she notices changes in you that are concerning.  Do not drive or use heavy machinery if you feel dizzy.  Do not drink alcohol. SEEK IMMEDIATE MEDICAL CARE IF:   Your dizziness or lightheadedness gets worse.  You feel nauseous or vomit.  You develop problems with talking, walking, weakness, or using your arms, hands, or legs.  You are not thinking clearly or you have difficulty forming sentences. It may take a friend or family member to determine if your thinking is normal.  You develop chest pain, abdominal pain, shortness of breath, or sweating.  Your vision changes.  You notice any bleeding.  You have side effects from medicine that seems to be getting worse rather than better. MAKE SURE YOU:   Understand these instructions.  Will watch your condition.  Will get help right away if you are not doing well or get worse. Document Released: 06/13/2000 Document Revised: 03/12/2011 Document Reviewed: 07/07/2010 [facility] Patient Information 2013 [place], [place].

## 2012-01-25 LAB — TSH: TSH: 1.187 u[IU]/mL (ref 0.350–4.500)

## 2012-01-25 LAB — COMPREHENSIVE METABOLIC PANEL
ALT: 11 U/L (ref 0–35)
AST: 16 U/L (ref 0–37)
Albumin: 4.5 g/dL (ref 3.5–5.2)
Alkaline Phosphatase: 95 U/L (ref 39–117)
BUN: 15 mg/dL (ref 6–23)
CO2: 25 mEq/L (ref 19–32)
Calcium: 9.7 mg/dL (ref 8.4–10.5)
Chloride: 106 mEq/L (ref 96–112)
Creat: 0.6 mg/dL (ref 0.50–1.10)
Glucose, Bld: 106 mg/dL — ABNORMAL HIGH (ref 70–99)
Potassium: 4 mEq/L (ref 3.5–5.3)
Sodium: 139 mEq/L (ref 135–145)
Total Bilirubin: 0.7 mg/dL (ref 0.3–1.2)
Total Protein: 7.4 g/dL (ref 6.0–8.3)

## 2012-01-25 LAB — T4, FREE: Free T4: 1.06 ng/dL (ref 0.80–1.80)

## 2012-01-28 ENCOUNTER — Ambulatory Visit: Payer: 59 | Admitting: Cardiology

## 2012-02-03 ENCOUNTER — Telehealth: Payer: Self-pay

## 2012-02-03 NOTE — Telephone Encounter (Signed)
PATIENT HAS TAKEN THE ROUND OF ANTIBIOTICS PRESCRIBED AND NOW HAS A YEAST INFECTION, SHE WOULD LIKE AN RX FOR THAT.  BEST [phone]

## 2012-02-04 MED ORDER — FLUCONAZOLE 150 MG PO TABS: 150.0000 mg | ORAL_TABLET | Freq: Once | ORAL | Status: DC

## 2012-02-04 NOTE — Telephone Encounter (Signed)
Rx sent to pharmacy   

## 2012-02-12 ENCOUNTER — Other Ambulatory Visit: Payer: Self-pay | Admitting: Physician Assistant

## 2012-02-18 ENCOUNTER — Encounter (INDEPENDENT_AMBULATORY_CARE_PROVIDER_SITE_OTHER): Payer: 59

## 2012-02-18 ENCOUNTER — Ambulatory Visit (INDEPENDENT_AMBULATORY_CARE_PROVIDER_SITE_OTHER): Payer: 59 | Admitting: Cardiology

## 2012-02-18 ENCOUNTER — Encounter: Payer: Self-pay | Admitting: Cardiology

## 2012-02-18 VITALS — BP 144/96 | HR 69 | Ht 64.0 in | Wt 223.1 lb

## 2012-02-18 DIAGNOSIS — R000 Tachycardia, unspecified: Secondary | ICD-10-CM

## 2012-02-18 DIAGNOSIS — R002 Palpitations: Secondary | ICD-10-CM | POA: Insufficient documentation

## 2012-02-18 DIAGNOSIS — H8110 Benign paroxysmal vertigo, unspecified ear: Secondary | ICD-10-CM

## 2012-02-18 DIAGNOSIS — R0989 Other specified symptoms and signs involving the circulatory and respiratory systems: Secondary | ICD-10-CM

## 2012-02-18 DIAGNOSIS — I10 Essential (primary) hypertension: Secondary | ICD-10-CM

## 2012-02-18 DIAGNOSIS — R42 Dizziness and giddiness: Secondary | ICD-10-CM

## 2012-02-18 DIAGNOSIS — R079 Chest pain, unspecified: Secondary | ICD-10-CM

## 2012-02-18 DIAGNOSIS — R0600 Dyspnea, unspecified: Secondary | ICD-10-CM

## 2012-02-18 NOTE — Progress Notes (Signed)
Merlinda Frederick Date of Birth: [birth year] Medical Record #[id number]  History of Present Illness: Brianna Hawkins is seen at the request of Dr. Milus Glazier for evaluation of vertigo. 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 She states that over the past 6 weeks she's experienced symptoms of vertigo. Her worst episode was on January 8. She was at work and suddenly developed acute vertigo with the room spinning around. She felt nauseated and broke out in a sweat. She had to hold onto her cart and after a while the spinning [place]. She had nausea vomiting. Since then she has had episodes that are not as bad but are similar. They may occur even when she is sitting down. In reviewing her history she apparently had an echocardiogram 2012 which was normal. She thinks she had a stress test in the past but I do not have these results. She did have an episode of vertigo a year ago at the same time. She does have chronic sinusitis. This past week she did have an episode of chest pain the awoke her from sleep. It was a sharp stabbing left precordial pain that lasted 10-15 minutes. This is the only episode of pain she has had. She does complain of some sensation that her heart is racing. She reports that her pediatrician told her she had a murmur related to a hole in her heart.  Current Outpatient Prescriptions on File Prior to Visit  Medication Sig Dispense Refill  . amLODipine (NORVASC) 10 MG tablet TAKE 1 TABLET BY MOUTH EVERY DAY  90 tablet  0  . amoxicillin (AMOXIL) 875 MG tablet Take 1 tablet (875 mg total) by mouth 2 (two) times daily.  20 tablet  0  . cefdinir (OMNICEF) 300 MG capsule Take 1 capsule (300 mg total) by mouth 2 (two) times daily.  20 capsule  0  . fluconazole (DIFLUCAN) 150 MG tablet Take 1 tablet (150 mg total) by mouth once. Repeat if needed  2 tablet  0  . NEXIUM 40 MG capsule TAKE 1 CAPSULE BY MOUTH TWICE A DAY FOR 3 DAYS THEN  ONCE A DAY  30 capsule  2  . tolterodine (DETROL LA) 4 MG 24 hr capsule Take 1 capsule (4 mg total) by mouth daily.  30 capsule  5   No current facility-administered medications on file prior to visit.    No Known Allergies  Past Medical History  Diagnosis Date  . Urinary urgency   . Pain, joint, shoulder region, right   . Headache   . Hypertension   . Allergy   . Blood transfusion   . GERD (gastroesophageal reflux disease)   . Heart murmur     " hole in heart "  . Tuberculosis     7 YEARS AGO  . Blood transfusion without reported diagnosis     Past Surgical History  Procedure Laterality Date  . Tubal ligation    . Breast lumpectomy      Underarm    History  Smoking status  . Former Smoker -- 0.30 packs/day for 15 years  . Types: Cigarettes  . Quit date: 01/01/2001  Smokeless tobacco  . Never Used    Comment: "never inhaled"    History  Alcohol Use No    Family History  Problem Relation Age of Onset  . Throat cancer Father     was a smoker  . Colon cancer Father   . Dementia Other   .  HIV Sister   . Stroke Mother   . Diabetes Paternal Grandmother     Review of Systems: The review of systems is positive for chronic sinusitis.  All other systems were reviewed and are negative.  Physical Exam: BP 144/96  Pulse 69  Ht 5\' 4"  (1.626 m)  Wt 223 lb 1.9 oz (101.207 kg)  BMI 38.28 kg/m2  SpO2 96%  LMP 01/11/2012 She is a pleasant white female in no acute distress. The patient is alert and oriented x 3.  The mood and affect are normal.  The skin is warm and dry.  Color is normal.  The HEENT exam reveals that the sclera are nonicteric.  The mucous membranes are moist.  The carotids are 2+ without bruits.  There is no thyromegaly.  There is no JVD.  The lungs are clear.  The chest wall is non tender.  The heart exam reveals a regular rate with a normal S1 and S2.  There are no murmurs, gallops, or rubs.  The PMI is not displaced.   Abdominal exam reveals good  bowel sounds.  There is no guarding or rebound.  There is no hepatosplenomegaly or tenderness.  There are no masses.  Exam of the legs reveal no clubbing, cyanosis, or edema.  The legs are without rashes.  The distal pulses are intact.  Cranial nerves II - XII are intact.  Motor and sensory functions are intact.  The gait is normal.  LABORATORY DATA: ECG demonstrates normal sinus rhythm with possible left atrial enlargement. It is otherwise normal. Lab Results  Component Value Date   WBC 5.8 01/24/2012   HGB 12.1 01/24/2012   HCT 36.0 01/24/2012   PLT 322 01/24/2012   GLUCOSE 106* 01/24/2012   CHOL 213* 03/29/2011   TRIG 95 03/29/2011   HDL 57 03/29/2011   LDLCALC 137* 03/29/2011   ALT 11 01/24/2012   AST 16 01/24/2012   NA 139 01/24/2012   K 4.0 01/24/2012   CL 106 01/24/2012   CREATININE 0.60 01/24/2012   BUN 15 01/24/2012   CO2 25 01/24/2012   TSH 1.187 01/24/2012     Assessment / Plan: 1. Vertigo. Her symptoms are most consistent with an inner ear problem. She does have associated sinusitis. We will have her wear an event monitor to rule out significant arrhythmia. I have requested a copy of her last cardiac evaluation including stress testing and echocardiogram to review. I believe this was done with Dr. Jacinto Halim. If her cardiac evaluation is unremarkable I would consider ENT evaluation. I'll followup in 6 weeks.  2. Hypercholesterolemia, mild. Recommend lifestyle modification and dietary measures.  3. Hypertension, controlled.

## 2012-02-18 NOTE — Patient Instructions (Signed)
We will try and locate your old cardiac studies.  We will have you wear a monitor.  I will see you again in 6 weeks.

## 2012-02-25 ENCOUNTER — Ambulatory Visit (INDEPENDENT_AMBULATORY_CARE_PROVIDER_SITE_OTHER): Payer: 59 | Admitting: Emergency Medicine

## 2012-02-25 VITALS — BP 127/77 | HR 91 | Temp 98.7°F | Resp 18 | Ht 67.0 in | Wt 228.0 lb

## 2012-02-25 DIAGNOSIS — J018 Other acute sinusitis: Secondary | ICD-10-CM

## 2012-02-25 DIAGNOSIS — H8110 Benign paroxysmal vertigo, unspecified ear: Secondary | ICD-10-CM

## 2012-02-25 DIAGNOSIS — J019 Acute sinusitis, unspecified: Secondary | ICD-10-CM

## 2012-02-25 MED ORDER — AMOXICILLIN-POT CLAVULANATE 875-125 MG PO TABS: 1.0000 | ORAL_TABLET | Freq: Two times a day (BID) | ORAL | Status: DC

## 2012-02-25 MED ORDER — MECLIZINE HCL 25 MG PO TABS: 25.0000 mg | ORAL_TABLET | Freq: Three times a day (TID) | ORAL | Status: DC | PRN

## 2012-02-25 MED ORDER — PSEUDOEPHEDRINE-GUAIFENESIN ER 60-600 MG PO TB12: 1.0000 | ORAL_TABLET | Freq: Two times a day (BID) | ORAL | Status: DC

## 2012-02-25 NOTE — Progress Notes (Signed)
[facility] [address] [place] [address] [phone]  Date:  02/25/2012   Name:  Brianna Hawkins   DOB:  [birth year]   MRN:  [id number]  PCP:  Elvina Sidle, MD    Chief Complaint: Sinusitis and Dizziness   History of Present Illness:  Brianna Hawkins is a 52 y.o. very pleasant female patient who presents with the following:  Ill with nasal congestion and post nasal drainage last week.  Now has green nasal drainage and post nasal drip.  No sore throat or cough. No wheezing or shortness of breath.  Chronic dizziness.  Has a fever and chills.  No nausea or vomiting. Is concerned that she may have "walking pneumonia".  Pain in maxillary sinuses.  Patient Active Problem List  Diagnosis  . Headache  . Hypertension  . Vertigo, benign positional  . Insomnia  . Chest pain  . Abnormal CXR  . Dyspnea  . BMI 34.0-34.9,adult  . Palpitations    Past Medical History  Diagnosis Date  . Urinary urgency   . Pain, joint, shoulder region, right   . Headache   . Hypertension   . Allergy   . Blood transfusion   . GERD (gastroesophageal reflux disease)   . Heart murmur     " hole in heart "  . Tuberculosis     7 YEARS AGO  . Blood transfusion without reported diagnosis     Past Surgical History  Procedure Laterality Date  . Tubal ligation    . Breast lumpectomy      Underarm    History  Substance Use Topics  . Smoking status: Former Smoker -- 0.30 packs/day for 15 years    Types: Cigarettes    Quit date: 01/01/2001  . Smokeless tobacco: Never Used     Comment: "never inhaled"  . Alcohol Use: No    Family History  Problem Relation Age of Onset  . Throat cancer Father     was a smoker  . Colon cancer Father   . Dementia Other   . HIV Sister   . Stroke Mother   . Diabetes Paternal Grandmother     No Known Allergies  Medication list has been reviewed and updated.  Current Outpatient Prescriptions on File Prior to Visit  Medication  Sig Dispense Refill  . amLODipine (NORVASC) 10 MG tablet TAKE 1 TABLET BY MOUTH EVERY DAY  90 tablet  0  . NEXIUM 40 MG capsule TAKE 1 CAPSULE BY MOUTH TWICE A DAY FOR 3 DAYS THEN ONCE A DAY  30 capsule  2  . tolterodine (DETROL LA) 4 MG 24 hr capsule Take 1 capsule (4 mg total) by mouth daily.  30 capsule  5  . amoxicillin (AMOXIL) 875 MG tablet Take 1 tablet (875 mg total) by mouth 2 (two) times daily.  20 tablet  0  . cefdinir (OMNICEF) 300 MG capsule Take 1 capsule (300 mg total) by mouth 2 (two) times daily.  20 capsule  0  . fluconazole (DIFLUCAN) 150 MG tablet Take 1 tablet (150 mg total) by mouth once. Repeat if needed  2 tablet  0   No current facility-administered medications on file prior to visit.    Review of Systems:  As per HPI, otherwise negative.    Physical Examination: Filed Vitals:   02/25/12 1601  BP: 127/77  Pulse: 91  Temp: 98.7 F (37.1 C)  Resp: 18   Filed Vitals:   02/25/12 1601  Height: 5\' 7"  (1.702 m)  Weight: 228 lb (103.42 kg)   Body mass index is 35.7 kg/(m^2). Ideal Body Weight: Weight in (lb) to have BMI = 25: 159.3  GEN: WDWN, NAD, Non-toxic, A & O x 3 HEENT: Atraumatic, Normocephalic. Neck supple. No masses, No LAD. Ears and Nose: No external deformity. CV: RRR, No M/G/R. No JVD. No thrill. No extra heart sounds. PULM: CTA B, no wheezes, crackles, rhonchi. No retractions. No resp. distress. No accessory muscle use. ABD: S, NT, ND, +BS. No rebound. No HSM. EXTR: No c/c/e NEURO Normal gait.  PSYCH: Normally interactive. Conversant. Not depressed or anxious appearing.  Calm demeanor.    Assessment and Plan: Sinusitis augmentin mucinex antivert  Brianna Dane, MD

## 2012-02-25 NOTE — Patient Instructions (Addendum)

## 2012-03-28 ENCOUNTER — Ambulatory Visit (INDEPENDENT_AMBULATORY_CARE_PROVIDER_SITE_OTHER): Payer: 59 | Admitting: Family Medicine

## 2012-03-28 VITALS — BP 118/84 | HR 76 | Temp 98.5°F | Resp 16 | Ht 64.0 in | Wt 228.0 lb

## 2012-03-28 DIAGNOSIS — J019 Acute sinusitis, unspecified: Secondary | ICD-10-CM

## 2012-03-28 DIAGNOSIS — L500 Allergic urticaria: Secondary | ICD-10-CM

## 2012-03-28 DIAGNOSIS — B379 Candidiasis, unspecified: Secondary | ICD-10-CM

## 2012-03-28 DIAGNOSIS — J0100 Acute maxillary sinusitis, unspecified: Secondary | ICD-10-CM

## 2012-03-28 MED ORDER — FLUCONAZOLE 150 MG PO TABS: 150.0000 mg | ORAL_TABLET | Freq: Once | ORAL | Status: DC

## 2012-03-28 MED ORDER — PREDNISONE 20 MG PO TABS: ORAL_TABLET | ORAL | Status: DC

## 2012-03-28 MED ORDER — LEVOFLOXACIN 500 MG PO TABS: 500.0000 mg | ORAL_TABLET | Freq: Every day | ORAL | Status: DC

## 2012-03-28 NOTE — Progress Notes (Signed)
  Subjective:    Patient ID: Brianna Hawkins, female    DOB: 25-Dec-1960, 52 y.o.   MRN: [id number]  HPI Patient comes into our office with complaints of her sinuses that started four days ago.She has been blowing out blood with her mucus.  Left side is worst than the right with facial pain.  Also, she has had bilateral shoulder and upper arm rash for a week, now only on right arm with whelps. The rash itches and skin is slightly sensitive   Review of Systems No tenderness of maxilla, no fever    Objective:   Physical Exam Nose is swollen L nasal passage with obvious bleeding point on septum Nontender maxilla Ears normal Oroph:  Normal Urticarial rash on right shoulder.   Neck: no adenop  Dimpled area of left septum cauterized with AGNO3       Assessment & Plan:  Sinusitis Epistaxis Urticaria Sinusitis, acute - Plan: levofloxacin (LEVAQUIN) 500 MG tablet, predniSONE (DELTASONE) 20 MG tablet  Allergic urticaria  Monilia infection - Plan: fluconazole (DIFLUCAN) 150 MG tablet

## 2012-04-02 ENCOUNTER — Ambulatory Visit: Payer: 59 | Admitting: Cardiology

## 2012-04-04 ENCOUNTER — Telehealth: Payer: Self-pay

## 2012-04-04 DIAGNOSIS — B379 Candidiasis, unspecified: Secondary | ICD-10-CM

## 2012-04-04 MED ORDER — FLUCONAZOLE 150 MG PO TABS: 150.0000 mg | ORAL_TABLET | Freq: Once | ORAL | Status: DC

## 2012-04-04 NOTE — Telephone Encounter (Signed)
Pt reports that she has finished all of the Levaquin and she is still very congested w/green mucus. It was making her dizzy today d/t the congestion. Advised pt to use Mucinex and drink plenty of water, and can take Zyrtec for any allergic component. Pt agreed. Pt states congestion still mainly in sinuses, not in chest. Can we Rx anything else for congestion, Atrovent or another NS? Pt requests another round of Abxs if appropriate.

## 2012-04-04 NOTE — Telephone Encounter (Signed)
Pt also states that she took the diflucan that we Rxd, but she needs another dose now that she finished Abx. I will send this in for her.

## 2012-04-04 NOTE — Telephone Encounter (Signed)
Please call patient on cell phone at [phone]

## 2012-04-04 NOTE — Telephone Encounter (Signed)
She had diflucan sent in on 3/28, did she get this? She should give the antibiotics more time, sinus infections can take a few days. What is she taking for the congestion? She should be using mucinex, and Zyrtec (or Claritin). I have left message for her to call me back.

## 2012-04-05 MED ORDER — IPRATROPIUM BROMIDE 0.03 % NA SOLN: 2.0000 | Freq: Two times a day (BID) | NASAL | Status: DC

## 2012-04-05 NOTE — Telephone Encounter (Signed)
Advised pt of notes 

## 2012-04-05 NOTE — Telephone Encounter (Signed)
Ok to repeat fluconazole.  I have sent Atrovent nasal to the pharmacy for her to use BID for congestion.  Agree with Mucinex, Zyrtec, push fluids.  If symptoms persist or worsen, RTC

## 2012-04-10 ENCOUNTER — Telehealth: Payer: Self-pay

## 2012-04-10 NOTE — Telephone Encounter (Signed)
Spoke with patient was told event monitor normal.Patient stated she has been having occasional chest pain.States she thinks pain is gas pain.States she will take over the counter medication and if not better will call back. Advised to go to ER if needed.Marland KitchenAppointment scheduled with Norma Fredrickson NP 04/29/12.

## 2012-04-29 ENCOUNTER — Ambulatory Visit: Payer: 59 | Admitting: Nurse Practitioner

## 2012-04-30 ENCOUNTER — Emergency Department (HOSPITAL_COMMUNITY)
Admission: EM | Admit: 2012-04-30 | Discharge: 2012-04-30 | Disposition: A | Discharge status: Home / Self Care | Payer: 59 | Attending: Emergency Medicine | Admitting: Emergency Medicine

## 2012-04-30 ENCOUNTER — Encounter (HOSPITAL_COMMUNITY): Payer: Self-pay | Admitting: *Deleted

## 2012-04-30 ENCOUNTER — Emergency Department (HOSPITAL_COMMUNITY): Payer: 59

## 2012-04-30 DIAGNOSIS — Y92410 Unspecified street and highway as the place of occurrence of the external cause: Secondary | ICD-10-CM | POA: Insufficient documentation

## 2012-04-30 DIAGNOSIS — S161XXA Strain of muscle, fascia and tendon at neck level, initial encounter: Secondary | ICD-10-CM

## 2012-04-30 DIAGNOSIS — Z8611 Personal history of tuberculosis: Secondary | ICD-10-CM | POA: Insufficient documentation

## 2012-04-30 DIAGNOSIS — Y9389 Activity, other specified: Secondary | ICD-10-CM | POA: Insufficient documentation

## 2012-04-30 DIAGNOSIS — R011 Cardiac murmur, unspecified: Secondary | ICD-10-CM | POA: Insufficient documentation

## 2012-04-30 DIAGNOSIS — I10 Essential (primary) hypertension: Secondary | ICD-10-CM | POA: Insufficient documentation

## 2012-04-30 DIAGNOSIS — S6990XA Unspecified injury of unspecified wrist, hand and finger(s), initial encounter: Secondary | ICD-10-CM | POA: Insufficient documentation

## 2012-04-30 DIAGNOSIS — S59909A Unspecified injury of unspecified elbow, initial encounter: Secondary | ICD-10-CM | POA: Insufficient documentation

## 2012-04-30 DIAGNOSIS — Z8719 Personal history of other diseases of the digestive system: Secondary | ICD-10-CM | POA: Insufficient documentation

## 2012-04-30 DIAGNOSIS — S139XXA Sprain of joints and ligaments of unspecified parts of neck, initial encounter: Secondary | ICD-10-CM | POA: Insufficient documentation

## 2012-04-30 DIAGNOSIS — Z87891 Personal history of nicotine dependence: Secondary | ICD-10-CM | POA: Insufficient documentation

## 2012-04-30 DIAGNOSIS — Z79899 Other long term (current) drug therapy: Secondary | ICD-10-CM | POA: Insufficient documentation

## 2012-04-30 DIAGNOSIS — M25531 Pain in right wrist: Secondary | ICD-10-CM

## 2012-04-30 MED ORDER — METHOCARBAMOL 500 MG PO TABS: 500.0000 mg | ORAL_TABLET | Freq: Two times a day (BID) | ORAL | Status: DC

## 2012-04-30 MED ORDER — NAPROXEN 500 MG PO TABS: 500.0000 mg | ORAL_TABLET | Freq: Two times a day (BID) | ORAL | Status: DC

## 2012-04-30 NOTE — ED Provider Notes (Signed)
Medical screening examination/treatment/procedure(s) were performed by non-physician practitioner and as supervising physician I was immediately available for consultation/collaboration.   [name], MD 04/30/12 [phone]
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
## 2012-06-03 ENCOUNTER — Ambulatory Visit: Payer: 59

## 2012-06-03 ENCOUNTER — Ambulatory Visit (INDEPENDENT_AMBULATORY_CARE_PROVIDER_SITE_OTHER): Payer: 59 | Admitting: Emergency Medicine

## 2012-06-03 VITALS — BP 130/90 | HR 65 | Temp 98.7°F | Resp 18 | Wt 228.0 lb

## 2012-06-03 DIAGNOSIS — M25539 Pain in unspecified wrist: Secondary | ICD-10-CM

## 2012-06-03 DIAGNOSIS — S63509A Unspecified sprain of unspecified wrist, initial encounter: Secondary | ICD-10-CM

## 2012-06-03 DIAGNOSIS — M25531 Pain in right wrist: Secondary | ICD-10-CM

## 2012-06-03 DIAGNOSIS — K219 Gastro-esophageal reflux disease without esophagitis: Secondary | ICD-10-CM

## 2012-06-03 MED ORDER — TRAMADOL HCL 50 MG PO TABS: 50.0000 mg | ORAL_TABLET | Freq: Four times a day (QID) | ORAL | Status: DC | PRN

## 2012-06-03 MED ORDER — ESOMEPRAZOLE MAGNESIUM 40 MG PO CPDR: 40.0000 mg | DELAYED_RELEASE_CAPSULE | Freq: Every day | ORAL | Status: DC

## 2012-06-03 MED ORDER — SUCRALFATE 1 G PO TABS: ORAL_TABLET | ORAL | Status: DC

## 2012-06-03 NOTE — Progress Notes (Signed)
Reviewed and agree.

## 2012-06-03 NOTE — Patient Instructions (Addendum)
Gastroesophageal Reflux Disease, Adult  Gastroesophageal reflux disease (GERD) happens when acid from your stomach flows up into the esophagus. When acid comes in contact with the esophagus, the acid causes soreness (inflammation) in the esophagus. Over time, GERD may create small holes (ulcers) in the lining of the esophagus.  CAUSES   · Increased body weight. This puts pressure on the stomach, making acid rise from the stomach into the esophagus.  · Smoking. This increases acid production in the stomach.  · Drinking alcohol. This causes decreased pressure in the lower esophageal sphincter (valve or ring of muscle between the esophagus and stomach), allowing acid from the stomach into the esophagus.  · Late evening meals and a full stomach. This increases pressure and acid production in the stomach.  · A malformed lower esophageal sphincter.  Sometimes, no cause is found.  SYMPTOMS   · Burning pain in the lower part of the mid-chest behind the breastbone and in the mid-stomach area. This may occur twice a week or more often.  · Trouble swallowing.  · Sore throat.  · Dry cough.  · Asthma-like symptoms including chest tightness, shortness of breath, or wheezing.  DIAGNOSIS   Your caregiver may be able to diagnose GERD based on your symptoms. In some cases, X-rays and other tests may be done to check for complications or to check the condition of your stomach and esophagus.  TREATMENT   Your caregiver may recommend over-the-counter or prescription medicines to help decrease acid production. Ask your caregiver before starting or adding any new medicines.   HOME CARE INSTRUCTIONS   · Change the factors that you can control. Ask your caregiver for guidance concerning weight loss, quitting smoking, and alcohol consumption.  · Avoid foods and drinks that make your symptoms worse, such as:  · Caffeine or alcoholic drinks.  · Chocolate.  · Peppermint or mint flavorings.  · Garlic and onions.  · Spicy foods.  · Citrus fruits,  such as oranges, lemons, or limes.  · Tomato-based foods such as sauce, chili, salsa, and pizza.  · Fried and fatty foods.  · Avoid lying down for the 3 hours prior to your bedtime or prior to taking a nap.  · Eat small, frequent meals instead of large meals.  · Wear loose-fitting clothing. Do not wear anything tight around your waist that causes pressure on your stomach.  · Raise the head of your bed 6 to 8 inches with wood blocks to help you sleep. Extra pillows will not help.  · Only take over-the-counter or prescription medicines for pain, discomfort, or fever as directed by your caregiver.  · Do not take aspirin, ibuprofen, or other nonsteroidal anti-inflammatory drugs (NSAIDs).  SEEK IMMEDIATE MEDICAL CARE IF:   · You have pain in your arms, neck, jaw, teeth, or back.  · Your pain increases or changes in intensity or duration.  · You develop nausea, vomiting, or sweating (diaphoresis).  · You develop shortness of breath, or you faint.  · Your vomit is green, yellow, black, or looks like coffee grounds or blood.  · Your stool is red, bloody, or black.  These symptoms could be signs of other problems, such as heart disease, gastric bleeding, or esophageal bleeding.  MAKE SURE YOU:   · Understand these instructions.  · Will watch your condition.  · Will get help right away if you are not doing well or get worse.  Document Released: 09/27/2004 Document Revised: 03/12/2011 Document Reviewed: 07/07/2010  [employer]® Patient   Information ©2014 [employer].
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
## 2012-06-11 ENCOUNTER — Ambulatory Visit: Payer: 59 | Admitting: Cardiology

## 2012-06-20 ENCOUNTER — Other Ambulatory Visit: Payer: Self-pay | Admitting: Physician Assistant

## 2012-07-07 ENCOUNTER — Ambulatory Visit (INDEPENDENT_AMBULATORY_CARE_PROVIDER_SITE_OTHER): Payer: 59 | Admitting: Family Medicine

## 2012-07-07 ENCOUNTER — Encounter: Payer: Self-pay | Admitting: Family Medicine

## 2012-07-07 VITALS — BP 140/88 | HR 70 | Temp 98.0°F | Resp 16 | Ht 65.5 in | Wt 227.0 lb

## 2012-07-07 DIAGNOSIS — G43909 Migraine, unspecified, not intractable, without status migrainosus: Secondary | ICD-10-CM

## 2012-07-07 DIAGNOSIS — I10 Essential (primary) hypertension: Secondary | ICD-10-CM

## 2012-07-07 DIAGNOSIS — K2970 Gastritis, unspecified, without bleeding: Secondary | ICD-10-CM

## 2012-07-07 DIAGNOSIS — K219 Gastro-esophageal reflux disease without esophagitis: Secondary | ICD-10-CM

## 2012-07-07 DIAGNOSIS — M549 Dorsalgia, unspecified: Secondary | ICD-10-CM

## 2012-07-07 DIAGNOSIS — R42 Dizziness and giddiness: Secondary | ICD-10-CM

## 2012-07-07 MED ORDER — AMLODIPINE BESYLATE 10 MG PO TABS: 10.0000 mg | ORAL_TABLET | Freq: Every day | ORAL | Status: DC

## 2012-07-07 MED ORDER — ESOMEPRAZOLE MAGNESIUM 40 MG PO CPDR: 40.0000 mg | DELAYED_RELEASE_CAPSULE | Freq: Every day | ORAL | Status: DC

## 2012-07-07 MED ORDER — MECLIZINE HCL 25 MG PO TABS: 25.0000 mg | ORAL_TABLET | Freq: Three times a day (TID) | ORAL | Status: DC | PRN

## 2012-07-07 MED ORDER — TRAMADOL HCL 50 MG PO TABS: 50.0000 mg | ORAL_TABLET | Freq: Four times a day (QID) | ORAL | Status: DC | PRN

## 2012-07-07 MED ORDER — METHOCARBAMOL 500 MG PO TABS: 500.0000 mg | ORAL_TABLET | Freq: Two times a day (BID) | ORAL | Status: DC | PRN

## 2012-07-07 NOTE — Progress Notes (Signed)
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  Patient comes in today complaining about dizziness. She's had these episodes since January 8 when she noticed an acute nausea and lightheadedness coming over her. She fell to her knees and the past and several minutes. This was followed by diaphoresis. Since that time she's had multiple other episodes the most recent being yesterday. Most of the episodes of mild yesterday was more dramatic in interfered with her activity. She says at times she does think she is having palpitations.  Patient also complains about sinus congestion which has been ongoing for some time. She's had no fever and no tinnitus.   Patient needs renewal of her FMLA form to the hospital. She continues to have dizzy spells and migraines about once or twice a month requiring one to 3 days off from work. During that time she's completely incapacitated with vomiting and loss of balance.  Objective: No acute distress Neurologically: Intact with normal cranial nerves, normal gait, normal motor, normal sensory. Chest: Clear Heart: Regular no murmur Neck: Supple no adenopathy or bruits Extremities: No edema Abdomen: Soft nontender without HSM  Assessment: Patient has not significantly deteriorated last year but she continues to have intermittent bouts of migraine and dizziness.  Plan: Refill current medications, FMLA form filled out Results for orders placed in visit on 01/24/12  CBC WITH DIFFERENTIAL      Result Value Range   WBC 5.8  4.0 - 10.5 K/uL   RBC 4.18  3.87 - 5.11 MIL/uL   Hemoglobin 12.1  12.0 - 15.0 g/dL   HCT 40.9  81.1 - 91.4 %   MCV 86.1  78.0 - 100.0 fL   MCH 28.9  26.0 - 34.0 pg   MCHC 33.6  30.0 - 36.0 g/dL   RDW 78.2  95.6 - 21.3 %   Platelets 322  150 - 400 K/uL   Neutrophils Relative % 63  43 - 77 %   Neutro Abs 3.8  1.7 - 7.7 K/uL   Lymphocytes Relative 25  12 - 46 %   Lymphs  Abs 1.4  0.7 - 4.0 K/uL   Monocytes Relative 9  3 - 12 %   Monocytes Absolute 0.5  0.1 - 1.0 K/uL   Eosinophils Relative 2  0 - 5 %   Eosinophils Absolute 0.1  0.0 - 0.7 K/uL   Basophils Relative 1  0 - 1 %   Basophils Absolute 0.0  0.0 - 0.1 K/uL   Smear Review Criteria for review not met    COMPREHENSIVE METABOLIC PANEL      Result Value Range   Sodium 139  135 - 145 mEq/L   Potassium 4.0  3.5 - 5.3 mEq/L   Chloride 106  96 - 112 mEq/L   CO2 25  19 - 32 mEq/L   Glucose, Bld 106 (*) 70 - 99 mg/dL   BUN 15  6 - 23 mg/dL   Creat 0.86  5.78 - 4.69 mg/dL   Total Bilirubin 0.7  0.3 - 1.2 mg/dL   Alkaline Phosphatase 95  39 - 117 U/L   AST 16  0 - 37 U/L   ALT 11  0 - 35 U/L   Total Protein 7.4  6.0 - 8.3 g/dL   Albumin 4.5  3.5 - 5.2 g/dL   Calcium 9.7  8.4 - 62.9 mg/dL  TSH  Result Value Range   TSH 1.187  0.350 - 4.500 uIU/mL  T4, FREE      Result Value Range   Free T4 1.06  0.80 - 1.80 ng/dL   Migraine - Plan: traMADol (ULTRAM) 50 MG tablet  Dizziness and giddiness - Plan: meclizine (ANTIVERT) 25 MG tablet  Hypertension - Plan: amLODipine (NORVASC) 10 MG tablet  Gastritis - Plan: esomeprazole (NEXIUM) 40 MG capsule  Back pain - Plan: traMADol (ULTRAM) 50 MG tablet, methocarbamol (ROBAXIN) 500 MG tablet  GERD (gastroesophageal reflux disease) - Plan: esomeprazole (NEXIUM) 40 MG capsule  Signed, Elvina Sidle, MD   Signed , Elvina Sidle

## 2012-07-07 NOTE — Patient Instructions (Signed)
Place gastroesophageal reflux disease patient instructions here. Hypertension As your heart beats, it forces blood through your arteries. This force is your blood pressure. If the pressure is too high, it is called hypertension (HTN) or high blood pressure. HTN is dangerous because you may have it and not know it. High blood pressure may mean that your heart has to work harder to pump blood. Your arteries may be narrow or stiff. The extra work puts you at risk for heart disease, stroke, and other problems.  Blood pressure consists of two numbers, a higher number over a lower, 110/72, for example. It is stated as "110 over 72." The ideal is below 120 for the top number (systolic) and under 80 for the bottom (diastolic). Write down your blood pressure today. You should pay close attention to your blood pressure if you have certain conditions such as:  Heart failure.  Prior heart attack.  Diabetes  Chronic kidney disease.  Prior stroke.  Multiple risk factors for heart disease. To see if you have HTN, your blood pressure should be measured while you are seated with your arm held at the level of the heart. It should be measured at least twice. A one-time elevated blood pressure reading (especially in the Emergency Department) does not mean that you need treatment. There may be conditions in which the blood pressure is different between your right and left arms. It is important to see your caregiver soon for a recheck. Most people have essential hypertension which means that there is not a specific cause. This type of high blood pressure may be lowered by changing lifestyle factors such as:  Stress.  Smoking.  Lack of exercise.  Excessive weight.  Drug/tobacco/alcohol use.  Eating less salt. Most people do not have symptoms from high blood pressure until it has caused damage to the body. Effective treatment can often prevent, delay or reduce that damage. TREATMENT  When a cause has been  identified, treatment for high blood pressure is directed at the cause. There are a large number of medications to treat HTN. These fall into several categories, and your caregiver will help you select the medicines that are best for you. Medications may have side effects. You should review side effects with your caregiver. If your blood pressure stays high after you have made lifestyle changes or started on medicines,   Your medication(s) may need to be changed.  Other problems may need to be addressed.  Be certain you understand your prescriptions, and know how and when to take your medicine.  Be sure to follow up with your caregiver within the time frame advised (usually within two weeks) to have your blood pressure rechecked and to review your medications.  If you are taking more than one medicine to lower your blood pressure, make sure you know how and at what times they should be taken. Taking two medicines at the same time can result in blood pressure that is too low. SEEK IMMEDIATE MEDICAL CARE IF:  You develop a severe headache, blurred or changing vision, or confusion.  You have unusual weakness or numbness, or a faint feeling.  You have severe chest or abdominal pain, vomiting, or breathing problems. MAKE SURE YOU:   Understand these instructions.  Will watch your condition.  Will get help right away if you are not doing well or get worse. Document Released: 12/18/2004 Document Revised: 03/12/2011 Document Reviewed: 08/08/2007 [facility] Patient Information 2014 [place], [place].

## 2012-07-17 ENCOUNTER — Ambulatory Visit (INDEPENDENT_AMBULATORY_CARE_PROVIDER_SITE_OTHER): Payer: 59 | Admitting: Family Medicine

## 2012-07-17 ENCOUNTER — Encounter: Payer: Self-pay | Admitting: Family Medicine

## 2012-07-17 VITALS — BP 130/83 | HR 73 | Temp 98.2°F | Resp 16 | Ht 66.0 in | Wt 227.0 lb

## 2012-07-17 DIAGNOSIS — G43909 Migraine, unspecified, not intractable, without status migrainosus: Secondary | ICD-10-CM

## 2012-07-17 DIAGNOSIS — M722 Plantar fascial fibromatosis: Secondary | ICD-10-CM

## 2012-07-17 DIAGNOSIS — R413 Other amnesia: Secondary | ICD-10-CM

## 2012-07-17 DIAGNOSIS — E669 Obesity, unspecified: Secondary | ICD-10-CM

## 2012-07-17 DIAGNOSIS — M79A19 Nontraumatic compartment syndrome of unspecified upper extremity: Secondary | ICD-10-CM

## 2012-07-17 MED ORDER — PREDNISONE 20 MG PO TABS: ORAL_TABLET | ORAL | Status: DC

## 2012-07-17 MED ORDER — SUMATRIPTAN SUCCINATE 100 MG PO TABS: 100.0000 mg | ORAL_TABLET | ORAL | Status: DC | PRN

## 2012-07-17 MED ORDER — PHENTERMINE HCL 15 MG PO CAPS: 15.0000 mg | ORAL_CAPSULE | ORAL | Status: DC

## 2012-07-17 NOTE — Progress Notes (Signed)
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  Patient comes in today complaining about dizziness. She's had these episodes since January 8 when she noticed an acute nausea and lightheadedness coming over her. She fell to her knees and the past and several minutes. This was followed by diaphoresis. Since that time she's had multiple other episodes the most recent being yesterday. Most of the episodes of mild yesterday was more dramatic in interfered with her activity. She says at times she does think she is having palpitations.  Patient also complains about sinus congestion which has been ongoing for some time. She's had no fever and no tinnitus.   Patient needs renewal of her FMLA form to the hospital. She continues to have dizzy spells and migraines about once or twice a month requiring one to 3 days off from work. During that time she's completely incapacitated with vomiting and loss of balance.  Objective: No acute distress  Neurologically: Intact with normal cranial nerves, normal gait, normal motor, normal sensory.  Chest: Clear  Heart: Regular no murmur  Neck: Supple no adenopathy or bruits  Extremities: No edema  Abdomen: Soft nontender without HSM  Assessment: Patient has not significantly deteriorated last year but she continues to have intermittent bouts of migraine and dizziness.  Plan: Refill current medications, FMLA form filled out  Results for orders placed in visit on 01/24/12   CBC WITH DIFFERENTIAL   Result  Value  Range    WBC  5.8  4.0 - 10.5 K/uL    RBC  4.18  3.87 - 5.11 MIL/uL    Hemoglobin  12.1  12.0 - 15.0 g/dL    HCT  16.1  09.6 - 04.5 %    MCV  86.1  78.0 - 100.0 fL    MCH  28.9  26.0 - 34.0 pg    MCHC  33.6  30.0 - 36.0 g/dL    RDW  40.9  81.1 - 91.4 %    Platelets  322  150 - 400 K/uL    Neutrophils Relative %  63  43 - 77 %    Neutro Abs  3.8  1.7 - 7.7 K/uL    Lymphocytes  Relative  25  12 - 46 %    Lymphs Abs  1.4  0.7 - 4.0 K/uL    Monocytes Relative  9  3 - 12 %    Monocytes Absolute  0.5  0.1 - 1.0 K/uL    Eosinophils Relative  2  0 - 5 %    Eosinophils Absolute  0.1  0.0 - 0.7 K/uL    Basophils Relative  1  0 - 1 %    Basophils Absolute  0.0  0.0 - 0.1 K/uL    Smear Review  Criteria for review not met    COMPREHENSIVE METABOLIC PANEL   Result  Value  Range    Sodium  139  135 - 145 mEq/L    Potassium  4.0  3.5 - 5.3 mEq/L    Chloride  106  96 - 112 mEq/L    CO2  25  19 - 32 mEq/L    Glucose, Bld  106 (*)  70 - 99 mg/dL    BUN  15  6 - 23 mg/dL    Creat  7.82  9.56 - 1.10 mg/dL    Total Bilirubin  0.7  0.3 - 1.2 mg/dL  Alkaline Phosphatase  95  39 - 117 U/L    AST  16  0 - 37 U/L    ALT  11  0 - 35 U/L    Total Protein  7.4  6.0 - 8.3 g/dL    Albumin  4.5  3.5 - 5.2 g/dL    Calcium  9.7  8.4 - 10.5 mg/dL   TSH   Result  Value  Range    TSH  1.187  0.350 - 4.500 uIU/mL   T4, FREE   Result  Value  Range    Free T4  1.06  0.80 - 1.80 ng/dL   Migraine - Plan: traMADol (ULTRAM) 50 MG tablet  Dizziness and giddiness - Plan: meclizine (ANTIVERT) 25 MG tablet  Hypertension - Plan: amLODipine (NORVASC) 10 MG tablet  Gastritis - Plan: esomeprazole (NEXIUM) 40 MG capsule  Back pain - Plan: traMADol (ULTRAM) 50 MG tablet, methocarbamol (ROBAXIN) 500 MG tablet  GERD (gastroesophageal reflux disease) - Plan: esomeprazole (NEXIUM) 40 MG capsule  Signed,  Elvina Sidle, MD    Memory loss She complains about intermittent memory loss. It's not affecting her work. She states that she forgets that she had met somebody within an hour or 2 yesterday. Is not having trouble with money, driving, ADLs. She has no family history of memory loss.  Objective: Patient relates normally, is alert with good eye contact.  Assessment: This is very concerning although patient seems to have very good long-term memory. She's functioning well and so I think this may be  of associated with some stress. Had a red we need to proceed with a neurology evaluation to make sure.  Headaches Patient continues to have headaches that last several hours, with a frequency of once or twice a week. These are associated with blurry vision. She's never tried Imitrex and is open to see if this will help.  Assessment: Patient most likely has migraines. She's agreed to give the Imitrex a try.   Right heel pain Patient has had one to 2 months of right heel pain. This is particularly acute in the morning when she gets up and walks barefoot to the bathroom. She's tried inserts of many varieties. Nothing has relieved the pain. She's had no history of trauma.  Objective: Palpation of the right heel reveals tenderness along the medial or tibial about her of the calcaneus.  Assessment: Plantar fasciitis. Patient is reluctant to have an injection and has agreed to try prednisone orally. If that doesn't work then we'll refer her for definitive therapy.

## 2012-08-21 ENCOUNTER — Ambulatory Visit: Payer: 59 | Admitting: Cardiology

## 2012-10-02 ENCOUNTER — Ambulatory Visit (INDEPENDENT_AMBULATORY_CARE_PROVIDER_SITE_OTHER): Payer: 59 | Admitting: Family Medicine

## 2012-10-02 ENCOUNTER — Encounter: Payer: Self-pay | Admitting: Family Medicine

## 2012-10-02 VITALS — BP 140/86 | HR 78 | Temp 98.5°F | Resp 16 | Ht 66.5 in | Wt 223.0 lb

## 2012-10-02 DIAGNOSIS — M25559 Pain in unspecified hip: Secondary | ICD-10-CM

## 2012-10-02 DIAGNOSIS — G4700 Insomnia, unspecified: Secondary | ICD-10-CM

## 2012-10-02 DIAGNOSIS — G43909 Migraine, unspecified, not intractable, without status migrainosus: Secondary | ICD-10-CM

## 2012-10-02 DIAGNOSIS — Z0000 Encounter for general adult medical examination without abnormal findings: Secondary | ICD-10-CM

## 2012-10-02 DIAGNOSIS — G8929 Other chronic pain: Secondary | ICD-10-CM

## 2012-10-02 DIAGNOSIS — M25552 Pain in left hip: Secondary | ICD-10-CM

## 2012-10-02 DIAGNOSIS — M79609 Pain in unspecified limb: Secondary | ICD-10-CM

## 2012-10-02 LAB — LIPID PANEL
Cholesterol: 203 mg/dL — ABNORMAL HIGH (ref 0–200)
HDL: 58 mg/dL (ref >39)
LDL Cholesterol: 130 mg/dL — ABNORMAL HIGH (ref 0–99)
Total CHOL/HDL Ratio: 3.5 Ratio
Triglycerides: 75 mg/dL (ref <150)
VLDL: 15 mg/dL (ref 0–40)

## 2012-10-02 LAB — CBC WITH DIFFERENTIAL/PLATELET
Basophils Absolute: 0.1 10*3/uL (ref 0.0–0.1)
Basophils Relative: 1 % (ref 0–1)
Eosinophils Absolute: 0.1 10*3/uL (ref 0.0–0.7)
Eosinophils Relative: 3 % (ref 0–5)
HCT: 37.8 % (ref 36.0–46.0)
Hemoglobin: 12.8 g/dL (ref 12.0–15.0)
Lymphocytes Relative: 40 % (ref 12–46)
Lymphs Abs: 1.9 10*3/uL (ref 0.7–4.0)
MCH: 29.1 pg (ref 26.0–34.0)
MCHC: 33.9 g/dL (ref 30.0–36.0)
MCV: 85.9 fL (ref 78.0–100.0)
Monocytes Absolute: 0.4 10*3/uL (ref 0.1–1.0)
Monocytes Relative: 9 % (ref 3–12)
Neutro Abs: 2.2 10*3/uL (ref 1.7–7.7)
Neutrophils Relative %: 47 % (ref 43–77)
Platelets: 294 10*3/uL (ref 150–400)
RBC: 4.4 MIL/uL (ref 3.87–5.11)
RDW: 15.1 % (ref 11.5–15.5)
WBC: 4.6 10*3/uL (ref 4.0–10.5)

## 2012-10-02 LAB — POCT URINALYSIS DIPSTICK
Bilirubin, UA: NEGATIVE
Blood, UA: NEGATIVE
Glucose, UA: NEGATIVE
Ketones, UA: NEGATIVE
Leukocytes, UA: NEGATIVE
Nitrite, UA: NEGATIVE
Protein, UA: NEGATIVE
Spec Grav, UA: 1.02
Urobilinogen, UA: 0.2
pH, UA: 7

## 2012-10-02 LAB — COMPREHENSIVE METABOLIC PANEL
ALT: 12 U/L (ref 0–35)
AST: 20 U/L (ref 0–37)
Albumin: 4.7 g/dL (ref 3.5–5.2)
Alkaline Phosphatase: 96 U/L (ref 39–117)
BUN: 8 mg/dL (ref 6–23)
CO2: 27 mEq/L (ref 19–32)
Calcium: 9.9 mg/dL (ref 8.4–10.5)
Chloride: 104 mEq/L (ref 96–112)
Creat: 0.62 mg/dL (ref 0.50–1.10)
Glucose, Bld: 110 mg/dL — ABNORMAL HIGH (ref 70–99)
Potassium: 4.1 mEq/L (ref 3.5–5.3)
Sodium: 140 mEq/L (ref 135–145)
Total Bilirubin: 0.7 mg/dL (ref 0.3–1.2)
Total Protein: 7.6 g/dL (ref 6.0–8.3)

## 2012-10-02 MED ORDER — DICLOFENAC SODIUM 75 MG PO TBEC: 75.0000 mg | DELAYED_RELEASE_TABLET | Freq: Two times a day (BID) | ORAL | Status: DC

## 2012-10-02 MED ORDER — LORAZEPAM 0.5 MG PO TABS: 0.5000 mg | ORAL_TABLET | Freq: Every evening | ORAL | Status: DC | PRN

## 2012-10-02 MED ORDER — SUMATRIPTAN SUCCINATE 100 MG PO TABS: 100.0000 mg | ORAL_TABLET | ORAL | Status: DC | PRN

## 2012-10-02 NOTE — Progress Notes (Signed)
  Subjective:    Patient ID: Brianna Hawkins, female    DOB: Mar 08, 1960, 52 y.o.   MRN: [id number]  HPI    Review of Systems  Constitutional: Positive for diaphoresis.  HENT: Positive for dental problem.   Eyes: Positive for visual disturbance.  Respiratory: Negative.   Cardiovascular: Negative.   Gastrointestinal: Positive for abdominal pain.  Endocrine: Negative.   Genitourinary: Negative.   Musculoskeletal: Positive for myalgias, back pain, arthralgias and gait problem.  Skin: Negative.   Allergic/Immunologic: Negative.   Neurological: Positive for dizziness, light-headedness and headaches.  Hematological: Bruises/bleeds easily.  Psychiatric/Behavioral: Positive for sleep disturbance.       Objective:   Physical Exam        Assessment & Plan:

## 2012-10-02 NOTE — Progress Notes (Addendum)
Patient ID: Brianna Hawkins MRN: [id number], DOB: [birth year], 52 y.o. 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  Patient has intermittent spells of dizziness, often accompanied by vomiting, which last several days and have been treated with meclizine.  She does have some temporary hearing loss but no ear pain with these.  She  has fallen down when the spell is severe in the past.  LMP: 1 week ago Pap: over a year ago MMG: 2 years ago Colonoscopy:  January of this year  Review of Systems: Consitutional: No fever, chills, fatigue, night sweats, lymphadenopathy, or weight changes.  Some diaphoresis occasionally Eyes: No eye redness, or discharge.  Does note some blurriness in vision ENT/Mouth: Ears: No otalgia, tinnitus, hearing loss, discharge. Nose: No congestion, rhinorrhea, sinus pain, or epistaxis. Throat: No sore throat, post nasal drip, or teeth pain. Cardiovascular: No CP, palpitations, diaphoresis, DOE, edema, orthopnea, PND. Respiratory: No cough, hemoptysis, SOB, or wheezing. Gastrointestinal: No anorexia, dysphagia, reflux, nausea, vomiting, hematemesis, diarrhea, constipation, BRBPR, or melena.complains of some left flank pain. Breast: No discharge, pain, swelling, or mass. Genitourinary: No dysuria, frequency, urgency, hematuria, incontinence, nocturia, amenorrhea, vaginal discharge, pruritis, burning, abnormal bleeding, or pain. Musculoskeletal: No decreased ROM, joint swelling, or weakness. Complains of right heel pain, left hip pain, and left paraspinal pain Skin: No rash, erythema, lesion changes, pain, warmth, jaundice, or pruritis. Neurological: No syncope, seizures, tremors, memory loss, coordination problems, or paresthesias. Complains of insomnia Psychological: No anxiety,  depression, hallucinations, SI/HI. Endocrine: No fatigue, polydipsia, polyphagia, polyuria, or known diabetes. All other systems were reviewed and are otherwise negative.  Past Medical History  Diagnosis Date  . Urinary urgency   . Pain, joint, shoulder region, right   . Headache(784.0)   . Hypertension   . Allergy   . Blood transfusion   . GERD (gastroesophageal reflux disease)   . Heart murmur     " hole in heart "  . Tuberculosis     7 YEARS AGO  . Blood transfusion without reported diagnosis      Past Surgical History  Procedure Laterality Date  . Tubal ligation    . Breast lumpectomy      Underarm    Home Meds:  Prior to Admission medications   Medication Sig Start Date End Date Taking? Authorizing Provider  amLODipine (NORVASC) 10 MG tablet Take 1 tablet (10 mg total) by mouth daily. 07/07/12   Elvina Sidle, MD  esomeprazole (NEXIUM) 40 MG capsule Take 1 capsule (40 mg total) by mouth daily before breakfast. 07/07/12   Elvina Sidle, MD  ipratropium (ATROVENT) 0.03 % nasal spray Place 2 sprays into the nose 2 (two) times daily.    Historical Provider, MD  meclizine (ANTIVERT) 25 MG tablet Take 1 tablet (25 mg total) by mouth 3 (three) times daily as needed. 07/07/12   Elvina Sidle, MD  methocarbamol (ROBAXIN) 500 MG tablet Take 1 tablet (500 mg total) by mouth 2 (two) times daily as needed. 07/07/12   Elvina Sidle, MD  Multiple Vitamins-Minerals (HAIR/SKIN/NAILS) TABS Take 1 tablet by mouth daily.    Historical Provider, MD  naproxen (NAPROSYN) 500 MG tablet Take 1 tablet (500 mg total) by mouth 2 (two) times daily with a meal. 04/30/12   Roxy Horseman, PA-C  phentermine 15 MG capsule Take 1 capsule (  15 mg total) by mouth every morning. 07/17/12   Elvina Sidle, MD  predniSONE (DELTASONE) 20 MG tablet 2 daily with food 07/17/12   Elvina Sidle, MD  SUMAtriptan (IMITREX) 100 MG tablet Take 1 tablet (100 mg total) by mouth every 2 (two) hours as needed for migraine.  07/17/12   Elvina Sidle, MD  traMADol (ULTRAM) 50 MG tablet Take 1 tablet (50 mg total) by mouth every 6 (six) hours as needed for pain. 07/07/12   Elvina Sidle, MD    Allergies: No Known Allergies  History   Social History  . Marital Status: Single    Spouse Name: N/A    Number of Children: 4  . Years of Education: N/A   Occupational History  . [employer]   Social History Main Topics  . Smoking status: Former Smoker -- 0.30 packs/day for 15 years    Types: Cigarettes    Quit date: 01/01/2001  . Smokeless tobacco: Never Used     Comment: "never inhaled"  . Alcohol Use: Yes  . Drug Use: No  . Sexual Activity: Yes   Other Topics Concern  . Not on file   Social History Narrative  . No narrative on file    Family History  Problem Relation Age of Onset  . Throat cancer Father     was a smoker  . Colon cancer Father   . Dementia Other   . HIV Sister   . Stroke Mother   . Diabetes Paternal Grandmother     Physical Exam: There were no vitals taken for this visit., There is no weight on file to calculate BMI. General: Well developed, well nourished, in no acute distress. HEENT: Normocephalic, atraumatic. Conjunctiva pink, sclera non-icteric. Pupils 2 mm constricting to 1 mm, round, regular, and equally reactive to light and accomodation. EOMI. Internal auditory canal clear. TMs with good cone of light and without pathology. Nasal mucosa pink. Nares are without discharge. No sinus tenderness. Oral mucosa pink. Dentition no caries. Pharynx without exudate.   Neck: Supple. Trachea midline. No thyromegaly. Full ROM. No lymphadenopathy. Lungs: Clear to auscultation bilaterally without wheezes, rales, or rhonchi. Breathing is of normal effort and unlabored. Cardiovascular: RRR with S1 S2. No murmurs, rubs, or gallops appreciated. Distal pulses 2+ symmetrically. No carotid or abdominal bruits. Breast: Symmetrical. No masses. Nipples without discharge. Abdomen:  Soft, non-tender, non-distended with normoactive bowel sounds. No hepatosplenomegaly or masses. No rebound/guarding. No CVA tenderness. Without hernias.  Genitourinary:  External genitalia without lesions. Vaginal mucosa pink. Cervix pink and without discharge. No cervical or adnexal tenderness. Pap smear taken Musculoskeletal: Full range of motion and 5/5 strength throughout. Without swelling, atrophy, tenderness, crepitus, or warmth. Extremities without clubbing, cyanosis, or edema. Calves supple.  Right heel is tender with deep palpation, left paraspinal area in lumbar region (L2) is tender, and left trochanter is tender. Skin: Warm and moist without erythema, ecchymosis, wounds, or rash. Neuro: A+Ox3. CN II-XII grossly intact. Moves all extremities spontaneously. Full sensation throughout. Normal gait. DTR 2+ throughout upper and lower extremities. Finger to nose intact. Psych:  Responds to questions appropriately with a normal affect.   Studies: CBC, CMET, Lipid UA:  Results for orders placed in visit on 10/02/12  POCT URINALYSIS DIPSTICK      Result Value Range   Color, UA yellow     Clarity, UA clear     Glucose, UA neg     Bilirubin, UA neg     Ketones, UA  neg     Spec Grav, UA 1.020     Blood, UA neg     pH, UA 7.0     Protein, UA neg     Urobilinogen, UA 0.2     Nitrite, UA neg     Leukocytes, UA Negative       Assessment/Plan:  52 y.o. y/o female here for CPE. Migraines - Plan: SUMAtriptan (IMITREX) 100 MG tablet  Insomnia - Plan: LORazepam (ATIVAN) 0.5 MG tablet  Heel pain, chronic, right - Plan: diclofenac (VOLTAREN) 75 MG EC tablet  Hip pain, acute, left - Plan: diclofenac (VOLTAREN) 75 MG EC tablet  Routine general medical examination at a health care facility - Plan: CBC with Differential, Comprehensive metabolic panel, Lipid panel, Pap IG (Image Guided), POCT urinalysis dipstick  I suggested that the patient's left flank soreness and left hip pain are probably  related to her plantar fasciitis on her right heel. I suggested that she get a cobbler to fashion an insert fashioned by a cobbler so she's able to walk more comfortably since she's not willing to have the heel injected    Signed, Elvina Sidle, MD 10/02/2012 8:20 AM

## 2012-10-03 ENCOUNTER — Other Ambulatory Visit: Payer: Self-pay | Admitting: Family Medicine

## 2012-10-03 ENCOUNTER — Telehealth: Payer: Self-pay

## 2012-10-03 DIAGNOSIS — M25552 Pain in left hip: Secondary | ICD-10-CM

## 2012-10-03 DIAGNOSIS — G8929 Other chronic pain: Secondary | ICD-10-CM

## 2012-10-03 DIAGNOSIS — G43909 Migraine, unspecified, not intractable, without status migrainosus: Secondary | ICD-10-CM

## 2012-10-03 LAB — PAP IG (IMAGE GUIDED)

## 2012-10-03 MED ORDER — SUMATRIPTAN SUCCINATE 100 MG PO TABS: 100.0000 mg | ORAL_TABLET | ORAL | Status: DC | PRN

## 2012-10-03 MED ORDER — DICLOFENAC SODIUM 75 MG PO TBEC: 75.0000 mg | DELAYED_RELEASE_TABLET | Freq: Two times a day (BID) | ORAL | Status: DC

## 2012-10-03 NOTE — Telephone Encounter (Signed)
Pt is calling because she said her prescriptions were not at the pharmacy however when i look in chart it looks like it has been sent and I verfied the pharmacy that she uses and it is the one it was sent too. Call back number is [phone]

## 2012-10-03 NOTE — Telephone Encounter (Signed)
Thanks, resent [facility] [address] was having computer difficulties the last couple days.

## 2012-10-05 ENCOUNTER — Encounter: Payer: Self-pay | Admitting: Family Medicine

## 2012-10-31 ENCOUNTER — Ambulatory Visit: Payer: 59 | Admitting: Cardiology

## 2012-11-11 ENCOUNTER — Encounter: Payer: Self-pay | Admitting: Cardiology

## 2013-01-01 HISTORY — PX: NOSE SURGERY: SHX723

## 2013-03-15 ENCOUNTER — Ambulatory Visit (INDEPENDENT_AMBULATORY_CARE_PROVIDER_SITE_OTHER): Payer: 59 | Admitting: Emergency Medicine

## 2013-03-15 ENCOUNTER — Ambulatory Visit: Payer: 59

## 2013-03-15 VITALS — BP 122/80 | HR 64 | Temp 98.0°F | Resp 16 | Ht 64.0 in | Wt 219.0 lb

## 2013-03-15 DIAGNOSIS — R109 Unspecified abdominal pain: Secondary | ICD-10-CM

## 2013-03-15 DIAGNOSIS — M25559 Pain in unspecified hip: Secondary | ICD-10-CM

## 2013-03-15 DIAGNOSIS — M76899 Other specified enthesopathies of unspecified lower limb, excluding foot: Secondary | ICD-10-CM

## 2013-03-15 DIAGNOSIS — M7070 Other bursitis of hip, unspecified hip: Secondary | ICD-10-CM

## 2013-03-15 LAB — POCT UA - MICROSCOPIC ONLY
Bacteria, U Microscopic: NEGATIVE
CASTS, UR, LPF, POC: NEGATIVE
Crystals, Ur, HPF, POC: NEGATIVE
MUCUS UA: NEGATIVE
RBC, urine, microscopic: NEGATIVE
YEAST UA: NEGATIVE

## 2013-03-15 LAB — POCT URINALYSIS DIPSTICK
BILIRUBIN UA: NEGATIVE
Glucose, UA: NEGATIVE
Leukocytes, UA: NEGATIVE
NITRITE UA: NEGATIVE
PH UA: 7
Protein, UA: NEGATIVE
RBC UA: NEGATIVE
SPEC GRAV UA: 1.02
Urobilinogen, UA: 1

## 2013-03-15 MED ORDER — ACETAMINOPHEN-CODEINE #3 300-30 MG PO TABS: 1.0000 | ORAL_TABLET | ORAL | Status: DC | PRN

## 2013-03-15 MED ORDER — NAPROXEN SODIUM 550 MG PO TABS: 550.0000 mg | ORAL_TABLET | Freq: Two times a day (BID) | ORAL | Status: DC

## 2013-03-15 NOTE — Patient Instructions (Signed)
Bursitis Bursitis is a swelling and soreness (inflammation) of a fluid-filled sac (bursa) that overlies and protects a joint. It can be caused by injury, overuse of the joint, arthritis or infection. The joints most likely to be affected are the elbows, shoulders, hips and knees. HOME CARE INSTRUCTIONS   Apply ice to the affected area for 15-20 minutes each hour while awake for 2 days. Put the ice in a plastic bag and place a towel between the bag of ice and your skin.  Rest the injured joint as much as possible, but continue to put the joint through a full range of motion, 4 times per day. (The shoulder joint especially becomes rapidly "frozen" if not used.) When the pain lessens, begin normal slow movements and usual activities.  Only take over-the-counter or prescription medicines for pain, discomfort or fever as directed by your caregiver.  Your caregiver may recommend draining the bursa and injecting medicine into the bursa. This may help the healing process.  Follow all instructions for follow-up with your caregiver. This includes any orthopedic referrals, physical therapy and rehabilitation. Any delay in obtaining necessary care could result in a delay or failure of the bursitis to heal and chronic pain. SEEK IMMEDIATE MEDICAL CARE IF:   Your pain increases even during treatment.  You develop an oral temperature above 102 F (38.9 C) and have heat and inflammation over the involved bursa. MAKE SURE YOU:   Understand these instructions.  Will watch your condition.  Will get help right away if you are not doing well or get worse. Document Released: 12/16/1999 Document Revised: 03/12/2011 Document Reviewed: 11/19/2008 [employer] Patient Information 2014 [employer].  

## 2013-03-15 NOTE — Progress Notes (Signed)
[facility] [address], [place] [address] [phone]  Date:  03/15/2013   Name:  Brianna Hawkins   DOB:  06/30/1960   MRN:  [id number]  PCP:  [name], MD    Chief Complaint: Back Pain, Flank Pain and Hip Pain   History of Present Illness:  SHAKETTA RILL is a 53 y.o. very pleasant female patient who presents with the following:  Has three week duration of pain in her right flank and left hip. No history of injury or overuse.  Says the pain in her right flank is worse when she bends over, stands from sitting and when she rolls over in bed.  Has no neuro symptoms or radiation of pain.   Has pain in left hip that is worse when she lays on her left side.  No improvement with over the counter medications or other home remedies. Denies other complaint or health concern today.   Patient Active Problem List   Diagnosis Date Noted  . Palpitations 02/18/2012  . BMI 34.0-34.9,adult 08/02/2011  . Chest pain 04/21/2011  . Abnormal CXR 04/21/2011  . Dyspnea 04/21/2011  . Hypertension 02/08/2011  . Vertigo, benign positional 02/08/2011  . Insomnia 02/08/2011  . Headache 02/07/2011    Past Medical History  Diagnosis Date  . Urinary urgency   . Pain, joint, shoulder region, right   . Headache(784.0)   . Hypertension   . Allergy   . Blood transfusion   . GERD (gastroesophageal reflux disease)   . Heart murmur     " hole in heart "  . Tuberculosis     7 YEARS AGO  . Blood transfusion without reported diagnosis   . Arthritis   . Substance abuse     Past Surgical History  Procedure Laterality Date  . Tubal ligation    . Breast lumpectomy      Underarm    History  Substance Use Topics  . Smoking status: Former Smoker -- 0.30 packs/day for 15 years    Types: Cigarettes    Quit date: 01/01/2001  . Smokeless tobacco: Never Used     Comment: "never inhaled"  . Alcohol Use: Yes    Family History  Problem Relation Age of Onset  .  Throat cancer Father     was a smoker  . Colon cancer Father   . Dementia Other   . HIV Sister   . Stroke Mother   . Diabetes Paternal Grandmother   . Diabetes Maternal Grandmother     No Known Allergies  Medication list has been reviewed and updated.  Current Outpatient Prescriptions on File Prior to Visit  Medication Sig Dispense Refill  . amLODipine (NORVASC) 10 MG tablet Take 1 tablet (10 mg total) by mouth daily.  90 tablet  3  . diclofenac (VOLTAREN) 75 MG EC tablet Take 1 tablet (75 mg total) by mouth 2 (two) times daily.  30 tablet  0  . esomeprazole (NEXIUM) 40 MG capsule Take 1 capsule (40 mg total) by mouth daily before breakfast.  30 capsule  5  . LORazepam (ATIVAN) 0.5 MG tablet Take 1 tablet (0.5 mg total) by mouth at bedtime as needed for anxiety.  30 tablet  1  . meclizine (ANTIVERT) 25 MG tablet Take 1 tablet (25 mg total) by mouth 3 (three) times daily as needed.  90 tablet  3  . methocarbamol (ROBAXIN) 500 MG tablet Take 1 tablet (500 mg total) by mouth 2 (two)  times daily as needed.  30 tablet  5  . Multiple Vitamins-Minerals (HAIR/SKIN/NAILS) TABS Take 1 tablet by mouth daily.      . naproxen (NAPROSYN) 500 MG tablet Take 1 tablet (500 mg total) by mouth 2 (two) times daily with a meal.  30 tablet  0  . SUMAtriptan (IMITREX) 100 MG tablet Take 1 tablet (100 mg total) by mouth every 2 (two) hours as needed for migraine.  10 tablet  0  . traMADol (ULTRAM) 50 MG tablet Take 1 tablet (50 mg total) by mouth every 6 (six) hours as needed for pain.  50 tablet  5  . ipratropium (ATROVENT) 0.03 % nasal spray Place 2 sprays into the nose 2 (two) times daily.      . phentermine 15 MG capsule TAKE ONE CAPSULE BY MOUTH EVERY MORNING  30 capsule  0   No current facility-administered medications on file prior to visit.    Review of Systems:  As per HPI, otherwise negative.    Physical Examination: Filed Vitals:   03/15/13 1719  BP: 122/80  Pulse: 64  Temp: 98 F (36.7  C)  Resp: 16   Filed Vitals:   03/15/13 1719  Height: 5\' 4"  (1.626 m)  Weight: 219 lb (99.338 kg)   Body mass index is 37.57 kg/(m^2). Ideal Body Weight: Weight in (lb) to have BMI = 25: 145.3  GEN: WDWN, NAD, Non-toxic, A & O x 3 HEENT: Atraumatic, Normocephalic. Neck supple. No masses, No LAD. Ears and Nose: No external deformity. CV: RRR, No M/G/R. No JVD. No thrill. No extra heart sounds. PULM: CTA B, no wheezes, crackles, rhonchi. No retractions. No resp. distress. No accessory muscle use. ABD: S, NT, ND, +BS. No rebound. No HSM. EXTR: No c/c/e  Marked tenderness over left hip  NEURO Normal gait.  PSYCH: Normally interactive. Conversant. Not depressed or anxious appearing.  Calm demeanor.  BACK:  No tenderness.    Assessment and Plan: Left hip bursitis Anaprox Ultram  Signed,  Ellison Carwin, MD   Results for orders placed in visit on 03/15/13  POCT UA - MICROSCOPIC ONLY      Result Value Ref Range   WBC, Ur, HPF, POC 0-1     RBC, urine, microscopic neg     Bacteria, U Microscopic neg     Mucus, UA neg     Epithelial cells, urine per micros 0-2     Crystals, Ur, HPF, POC neg     Casts, Ur, LPF, POC neg     Yeast, UA neg    POCT URINALYSIS DIPSTICK      Result Value Ref Range   Color, UA yellow     Clarity, UA clear     Glucose, UA neg     Bilirubin, UA neg     Ketones, UA trace     Spec Grav, UA 1.020     Blood, UA neg     pH, UA 7.0     Protein, UA neg     Urobilinogen, UA 1.0     Nitrite, UA neg     Leukocytes, UA Negative      [facility] reading (PRIMARY) by  Dr. Ouida Sills.  Negative hip .

## 2013-03-25 ENCOUNTER — Telehealth: Payer: Self-pay | Admitting: *Deleted

## 2013-03-25 NOTE — Telephone Encounter (Signed)
PT NEEDS HER RX'S TRANSFERRED FROM [facility] [address] TO THE [facility] ON [address] BECAUSE HER BENNY CARD WILL NOT WORK AT THE [facility].  HER # IS [id number]

## 2013-03-25 NOTE — Telephone Encounter (Signed)
Advised [facility] to [facility] since she has refills for them to contact [facility] and [facility] her rxs.

## 2013-04-02 ENCOUNTER — Ambulatory Visit (INDEPENDENT_AMBULATORY_CARE_PROVIDER_SITE_OTHER): Payer: 59 | Admitting: Physician Assistant

## 2013-04-02 VITALS — BP 130/82 | HR 91 | Temp 98.4°F | Resp 16 | Ht 66.25 in | Wt 220.0 lb

## 2013-04-02 DIAGNOSIS — J019 Acute sinusitis, unspecified: Secondary | ICD-10-CM

## 2013-04-02 DIAGNOSIS — Z79899 Other long term (current) drug therapy: Secondary | ICD-10-CM

## 2013-04-02 MED ORDER — FLUTICASONE PROPIONATE 50 MCG/ACT NA SUSP: 2.0000 | Freq: Every day | NASAL | Status: DC

## 2013-04-02 MED ORDER — AMOXICILLIN 500 MG PO CAPS: 500.0000 mg | ORAL_CAPSULE | Freq: Three times a day (TID) | ORAL | Status: DC

## 2013-04-02 MED ORDER — PREDNISONE 20 MG PO TABS: 20.0000 mg | ORAL_TABLET | Freq: Every day | ORAL | Status: DC

## 2013-04-02 NOTE — Progress Notes (Signed)
Subjective:    Patient ID: Brianna Hawkins, female    DOB: 12-16-1960, 53 y.o.   MRN: [id number]  HPI Primary Physician: [name], MD  Chief Complaint: Sinus congestion x 1 month  HPI: 53 y.o. female with history below presents with 1 month history of sinus congestion, nasal congestion, post nasal drip, and sinus headache. Nasal congestion is thick and green. Sinus pressure is located along the left maxillary sinus. Sinus headache along left frontal sinus. Some blood mixed in with mucus when she blows her nose. Afebrile. No chills. No cough.   She has not tried any medications. She prone to sinus issues.    Past Medical History  Diagnosis Date  . Urinary urgency   . Pain, joint, shoulder region, right   . Headache(784.0)   . Hypertension   . Allergy   . Blood transfusion   . GERD (gastroesophageal reflux disease)   . Heart murmur     " hole in heart "  . Tuberculosis     7 YEARS AGO  . Blood transfusion without reported diagnosis   . Arthritis   . Substance abuse      Home Meds: Prior to Admission medications   Medication Sig Start Date End Date Taking? Authorizing Provider  acetaminophen-codeine (TYLENOL #3) 300-30 MG per tablet Take 1-2 tablets by mouth every 4 (four) hours as needed. 03/15/13  Yes Ellison Carwin, MD  amLODipine (NORVASC) 10 MG tablet Take 1 tablet (10 mg total) by mouth daily. 07/07/12  Yes [name], MD  diclofenac (VOLTAREN) 75 MG EC tablet Take 1 tablet (75 mg total) by mouth 2 (two) times daily. 10/03/12  Yes [name], MD  esomeprazole (NEXIUM) 40 MG capsule Take 1 capsule (40 mg total) by mouth daily before breakfast. 07/07/12  Yes [name], MD  ipratropium (ATROVENT) 0.03 % nasal spray Place 2 sprays into the nose 2 (two) times daily.   Yes Historical Provider, MD  LORazepam (ATIVAN) 0.5 MG tablet Take 1 tablet (0.5 mg total) by mouth at bedtime as needed for anxiety. 10/02/12  Yes [name], MD  meclizine (ANTIVERT)  25 MG tablet Take 1 tablet (25 mg total) by mouth 3 (three) times daily as needed. 07/07/12  Yes [name], MD  methocarbamol (ROBAXIN) 500 MG tablet Take 1 tablet (500 mg total) by mouth 2 (two) times daily as needed. 07/07/12  Yes [name], MD  Multiple Vitamins-Minerals (HAIR/SKIN/NAILS) TABS Take 1 tablet by mouth daily.   Yes Historical Provider, MD  naproxen (NAPROSYN) 500 MG tablet Take 1 tablet (500 mg total) by mouth 2 (two) times daily with a meal. 04/30/12  Yes Montine Circle, PA-C  naproxen sodium (ANAPROX DS) 550 MG tablet Take 1 tablet (550 mg total) by mouth 2 (two) times daily with a meal. 03/15/13 03/15/14 Yes Ellison Carwin, MD  phentermine 15 MG capsule TAKE ONE CAPSULE BY MOUTH EVERY MORNING 10/03/12  Yes [name], MD  SUMAtriptan (IMITREX) 100 MG tablet Take 1 tablet (100 mg total) by mouth every 2 (two) hours as needed for migraine. 10/03/12  Yes [name], MD  traMADol (ULTRAM) 50 MG tablet Take 1 tablet (50 mg total) by mouth every 6 (six) hours as needed for pain. 07/07/12  Yes [name], MD    Allergies: No Known Allergies  History   Social History  . Marital Status: Single    Spouse Name: N/A    Number of Children: 4  . Years of Education: N/A   Occupational  History  . [employer]   Social History Main Topics  . Smoking status: Former Smoker -- 0.30 packs/day for 15 years    Types: Cigarettes    Quit date: 01/01/2001  . Smokeless tobacco: Never Used     Comment: "never inhaled"  . Alcohol Use: Yes  . Drug Use: No  . Sexual Activity: Yes   Other Topics Concern  . Not on file   Social History Narrative   Single. Education: [employer].     Review of Systems  Constitutional: Negative for fever, chills and fatigue.  HENT: Positive for congestion, nosebleeds, postnasal drip and sinus pressure. Negative for ear pain, hearing loss, rhinorrhea, sneezing and sore throat.        Nasal congestion. Left sinus pressure.     Respiratory: Negative for cough, shortness of breath and wheezing.   Gastrointestinal: Negative for nausea, vomiting and diarrhea.  Musculoskeletal: Negative for myalgias.  Allergic/Immunologic: Negative for environmental allergies.  Neurological: Positive for headaches.       Left frontal sinus headache.        Objective:   Physical Exam  Physical Exam: Blood pressure 130/82, pulse 91, temperature 98.4 F (36.9 C), temperature source Oral, resp. rate 16, height 5' 6.25" (1.683 m), weight 220 lb (99.791 kg), last menstrual period 01/01/2013, SpO2 98.00%., Body mass index is 35.23 kg/(m^2). General: Well developed, well nourished, in no acute distress. Head: Normocephalic, atraumatic, eyes without discharge, sclera non-icteric, nares are without discharge. Bilateral auditory canals clear, TM's are without perforation, pearly grey and translucent with reflective cone of light bilaterally. Oral cavity moist, posterior pharynx with post nasal drip No exudate, erythema, no peritonsillar abscess. Uvula midline.   Neck: Supple. No thyromegaly. Full ROM. No lymphadenopathy. No nuchal rigidity.  Lungs: Clear bilaterally to auscultation without wheezes, rales, or rhonchi. Breathing is unlabored. Heart: RRR with S1 S2. No murmurs, rubs, or gallops appreciated. Msk:  Strength and tone normal for age. Extremities/Skin: Warm and dry. No clubbing or cyanosis. No edema. No rashes or suspicious lesions. Neuro: Alert and oriented X 3. Moves all extremities spontaneously. Gait is normal. CNII-XII grossly in tact. Psych:  Responds to questions appropriately with a normal affect.        Assessment & Plan:  53 year old female with sinusitis and polypharmacy  -Amoxicillin 875 mg 1 po bid #20 no RF -Flonase 2 sprays each nare daily #1 no RF -Prednisone 20 mg 2 po daily for 5 days #10 no RF, SED -Continue Atrovent nasal spray -Saline nasal spray   Christell Faith, MHS, PA-C [facility] [place], [place] [address] [facility] 04/02/2013 12:59 PM

## 2013-04-30 ENCOUNTER — Encounter: Payer: Self-pay | Admitting: Family Medicine

## 2013-04-30 ENCOUNTER — Ambulatory Visit (INDEPENDENT_AMBULATORY_CARE_PROVIDER_SITE_OTHER): Payer: 59 | Admitting: Family Medicine

## 2013-04-30 VITALS — BP 120/82 | HR 77 | Temp 98.1°F | Resp 16 | Ht 65.5 in | Wt 225.6 lb

## 2013-04-30 DIAGNOSIS — J329 Chronic sinusitis, unspecified: Secondary | ICD-10-CM

## 2013-04-30 DIAGNOSIS — K219 Gastro-esophageal reflux disease without esophagitis: Secondary | ICD-10-CM

## 2013-04-30 DIAGNOSIS — R209 Unspecified disturbances of skin sensation: Secondary | ICD-10-CM

## 2013-04-30 DIAGNOSIS — R109 Unspecified abdominal pain: Secondary | ICD-10-CM

## 2013-04-30 DIAGNOSIS — R51 Headache: Secondary | ICD-10-CM

## 2013-04-30 DIAGNOSIS — K2970 Gastritis, unspecified, without bleeding: Secondary | ICD-10-CM

## 2013-04-30 DIAGNOSIS — R202 Paresthesia of skin: Secondary | ICD-10-CM

## 2013-04-30 DIAGNOSIS — K2990 Gastroduodenitis, unspecified, without bleeding: Secondary | ICD-10-CM

## 2013-04-30 DIAGNOSIS — G4700 Insomnia, unspecified: Secondary | ICD-10-CM

## 2013-04-30 LAB — CBC WITH DIFFERENTIAL/PLATELET
Basophils Absolute: 0.1 10*3/uL (ref 0.0–0.1)
Basophils Relative: 1 % (ref 0–1)
Eosinophils Absolute: 0.2 10*3/uL (ref 0.0–0.7)
Eosinophils Relative: 4 % (ref 0–5)
HCT: 33.6 % — ABNORMAL LOW (ref 36.0–46.0)
Hemoglobin: 11.4 g/dL — ABNORMAL LOW (ref 12.0–15.0)
Lymphocytes Relative: 39 % (ref 12–46)
Lymphs Abs: 2.2 10*3/uL (ref 0.7–4.0)
MCH: 28.6 pg (ref 26.0–34.0)
MCHC: 33.9 g/dL (ref 30.0–36.0)
MCV: 84.2 fL (ref 78.0–100.0)
Monocytes Absolute: 0.6 10*3/uL (ref 0.1–1.0)
Monocytes Relative: 10 % (ref 3–12)
Neutro Abs: 2.6 10*3/uL (ref 1.7–7.7)
Neutrophils Relative %: 46 % (ref 43–77)
Platelets: 278 10*3/uL (ref 150–400)
RBC: 3.99 MIL/uL (ref 3.87–5.11)
RDW: 14.8 % (ref 11.5–15.5)
WBC: 5.6 10*3/uL (ref 4.0–10.5)

## 2013-04-30 LAB — TSH: TSH: 1.791 u[IU]/mL (ref 0.350–4.500)

## 2013-04-30 LAB — VITAMIN B12: Vitamin B-12: 849 pg/mL (ref 211–911)

## 2013-04-30 MED ORDER — ESOMEPRAZOLE MAGNESIUM 40 MG PO CPDR: 40.0000 mg | DELAYED_RELEASE_CAPSULE | Freq: Every day | ORAL | Status: DC

## 2013-04-30 MED ORDER — LEVOFLOXACIN 500 MG PO TABS: 500.0000 mg | ORAL_TABLET | Freq: Every day | ORAL | Status: DC

## 2013-04-30 MED ORDER — LORAZEPAM 0.5 MG PO TABS: 0.5000 mg | ORAL_TABLET | Freq: Every evening | ORAL | Status: DC | PRN

## 2013-04-30 NOTE — Progress Notes (Signed)
Subjective:    Patient ID: Brianna Hawkins, female    DOB: 1960-06-15, 53 y.o.   MRN: [id number] This chart was scribed for [name], MD by Anastasia Pall, ED Scribe. This patient was seen in room 24 and the patient's care was started at 8:13 AM.  Chief Complaint  Patient presents with   sinus infection   Abdominal Pain   skin crawling   HPI Brianna Hawkins is a 53 y.o. female with history below presents with sinus infection, abdominal pain, and a crawling skin feeling.   Pt reports her hip is without pain, which was her original complaint when she called to schedule an appointment. She reports a recent sinus infection, with associated epistaxis the other day. She reports a large clot of blot fell out of her nose. She was able to stop the bleeding. She denies multiple episodes of epistaxis. She denies facial swelling, but states her right nasal is more inflamed than her left.   She states she ran out of Nexium and has abdominal pain in the pit of her stomach as a result. She denies nausea and diarrhea. She also reports the sensation of her skin crawling around the back of her LE and the back of her UE. She denies sleeping well, stating she has a lot of thoughts running through her mind.   She states her headaches have subsided, but still has intermittent dizziness. She reports her most recent episode was while she was driving. She denies taking her weight loss pill. She is still taking her Naproxyn and muscle relaxant PRN. She is no longer on Amoxicillin. She denies using Flonase regularly since her nosebleed.   Patient still working at [employer].  Husband, 42, also working  Patient Active Problem List   Diagnosis Date Noted   Palpitations 02/18/2012   BMI 34.0-34.9,adult 08/02/2011   Chest pain 04/21/2011   Abnormal CXR 04/21/2011   Dyspnea 04/21/2011   Hypertension 02/08/2011   Vertigo, benign positional 02/08/2011   Insomnia 02/08/2011   Headache 02/07/2011     Past Medical History  Diagnosis Date   Urinary urgency    Pain, joint, shoulder region, right    Headache(784.0)    Hypertension    Allergy    Blood transfusion    GERD (gastroesophageal reflux disease)    Heart murmur     " hole in heart "   Tuberculosis     7 YEARS AGO   Blood transfusion without reported diagnosis    Arthritis    Substance abuse    Past Surgical History  Procedure Laterality Date   Tubal ligation     Breast lumpectomy      Underarm   No Known Allergies Prior to Admission medications   Medication Sig Start Date End Date Taking? Authorizing Provider  acetaminophen-codeine (TYLENOL #3) 300-30 MG per tablet Take 1-2 tablets by mouth every 4 (four) hours as needed. 03/15/13   Ellison Carwin, MD  amLODipine (NORVASC) 10 MG tablet Take 1 tablet (10 mg total) by mouth daily. 07/07/12   [name], MD  amoxicillin (AMOXIL) 500 MG capsule Take 1 capsule (500 mg total) by mouth 3 (three) times daily. 04/02/13   Rise Mu, PA-C  diclofenac (VOLTAREN) 75 MG EC tablet Take 1 tablet (75 mg total) by mouth 2 (two) times daily. 10/03/12   [name], MD  esomeprazole (NEXIUM) 40 MG capsule Take 1 capsule (40 mg total) by mouth daily before breakfast. 07/07/12   [name],  MD  fluticasone (FLONASE) 50 MCG/ACT nasal spray Place 2 sprays into both nostrils daily. 04/02/13   [name], PA-C  ipratropium (ATROVENT) 0.03 % nasal spray Place 2 sprays into the nose 2 (two) times daily.    Historical Provider, MD  LORazepam (ATIVAN) 0.5 MG tablet Take 1 tablet (0.5 mg total) by mouth at bedtime as needed for anxiety. 10/02/12   [name], MD  meclizine (ANTIVERT) 25 MG tablet Take 1 tablet (25 mg total) by mouth 3 (three) times daily as needed. 07/07/12   [name], MD  methocarbamol (ROBAXIN) 500 MG tablet Take 1 tablet (500 mg total) by mouth 2 (two) times daily as needed. 07/07/12   [name], MD  Multiple Vitamins-Minerals (HAIR/SKIN/NAILS)  TABS Take 1 tablet by mouth daily.    Historical Provider, MD  naproxen (NAPROSYN) 500 MG tablet Take 1 tablet (500 mg total) by mouth 2 (two) times daily with a meal. 04/30/12   Montine Circle, PA-C  naproxen sodium (ANAPROX DS) 550 MG tablet Take 1 tablet (550 mg total) by mouth 2 (two) times daily with a meal. 03/15/13 03/15/14  Ellison Carwin, MD  phentermine 15 MG capsule TAKE ONE CAPSULE BY MOUTH EVERY MORNING 10/03/12   [name], MD  predniSONE (DELTASONE) 20 MG tablet Take 1 tablet (20 mg total) by mouth daily with breakfast. 04/02/13   Rise Mu, PA-C  SUMAtriptan (IMITREX) 100 MG tablet Take 1 tablet (100 mg total) by mouth every 2 (two) hours as needed for migraine. 10/03/12   [name], MD  traMADol (ULTRAM) 50 MG tablet Take 1 tablet (50 mg total) by mouth every 6 (six) hours as needed for pain. 07/07/12   [name], MD   Review of Systems  Constitutional: Negative for fever.  HENT: Positive for congestion, nosebleeds and sinus pressure.   Respiratory: Negative for cough.   Gastrointestinal: Positive for abdominal pain. Negative for nausea, vomiting and diarrhea.  Musculoskeletal: Negative for arthralgias, gait problem and myalgias.  Skin:       + for intermittent crawling skin feeling around back of legs and arms  Neurological: Positive for dizziness. Negative for syncope and headaches.  Psychiatric/Behavioral: Positive for sleep disturbance.      Objective:   Physical Exam  Nursing note and vitals reviewed. Constitutional: She is oriented to person, place, and time. She appears well-developed and well-nourished. No distress.  HENT:  Head: Normocephalic and atraumatic.  Right Ear: External ear and ear canal normal.  Left Ear: Tympanic membrane, external ear and ear canal normal.  Mouth/Throat: Uvula is midline, oropharynx is clear and moist and mucous membranes are normal. No oropharyngeal exudate.  Sinuses show moderate edema and swelling in the right nasal  passage. Oropharynx shows no acute abnormalities. Pt is missing quite a few teeth.   Eyes: EOM are normal.  Neck: Normal range of motion. Neck supple. No thyromegaly present.  Cardiovascular: Normal rate, regular rhythm and normal heart sounds.   No murmur heard. Pulmonary/Chest: Effort normal and breath sounds normal. No respiratory distress. She has no wheezes. She has no rales.  Abdominal: Soft. Bowel sounds are normal. She exhibits no distension. There is no hepatosplenomegaly.  Musculoskeletal: Normal range of motion. She exhibits no edema and no tenderness.       Right hip: Normal.       Left hip: Normal.  Lymphadenopathy:    She has no cervical adenopathy.  Neurological: She is alert and oriented to person, place, and time. She has  normal strength and normal reflexes. No cranial nerve deficit or sensory deficit. She exhibits normal muscle tone. Coordination normal.  Sensation intact. Reflexes knees and ankles normal. SLR normal bilaterally.   Skin: Skin is warm and dry.  Psychiatric: She has a normal mood and affect. Her behavior is normal.  BP 120/82   Pulse 77   Temp(Src) 98.1 F (36.7 C) (Oral)   Resp 16   Ht 5' 5.5" (1.664 m)   Wt 225 lb 9.6 oz (102.331 kg)   BMI 36.96 kg/m2   SpO2 99%   LMP 04/30/2013     Assessment & Plan:   Headache(784.0)  Paresthesia  Abdominal pain, unspecified site  Gastritis - Plan: esomeprazole (NEXIUM) 40 MG capsule  GERD (gastroesophageal reflux disease) - Plan: esomeprazole (NEXIUM) 40 MG capsule  Insomnia - Plan: LORazepam (ATIVAN) 0.5 MG tablet  Sinusitis, chronic - Plan: levofloxacin (LEVAQUIN) 500 MG tablet Patient appears to have continued sinus problems. She also has chronic epigastric pain which needs to be that that is a H. pylori titer. I'm not sure was causing the skin crawling issue although patient is under quite a bit distress and this may contribute to the symptoms.  Her insomnia seems well controlled by the  lorazepam.  Signed, [name], MD

## 2013-04-30 NOTE — Patient Instructions (Signed)
Paresthesia °Paresthesia is an abnormal burning or prickling sensation. This sensation is generally felt in the hands, arms, legs, or feet. However, it may occur in any part of the body. It is usually not painful. The feeling may be described as: °· Tingling or numbness. °· "Pins and needles." °· Skin crawling. °· Buzzing. °· Limbs "falling asleep." °· Itching. °Most people experience temporary (transient) paresthesia at some time in their lives. °CAUSES  °Paresthesia may occur when you breathe too quickly (hyperventilation). It can also occur without any apparent cause. Commonly, paresthesia occurs when pressure is placed on a nerve. The feeling quickly goes away once the pressure is removed. For some people, however, paresthesia is a long-lasting (chronic) condition caused by an underlying disorder. The underlying disorder may be: °· A traumatic, direct injury to nerves. Examples include a: °· Broken (fractured) neck. °· Fractured skull. °· A disorder affecting the brain and spinal cord (central nervous system). Examples include: °· Transverse myelitis. °· Encephalitis. °· Transient ischemic attack. °· Multiple sclerosis. °· Stroke. °· Tumor or blood vessel problems, such as an arteriovenous malformation pressing against the brain or spinal cord. °· A condition that damages the peripheral nerves (peripheral neuropathy). Peripheral nerves are not part of the brain and spinal cord. These conditions include: °· Diabetes. °· Peripheral vascular disease. °· Nerve entrapment syndromes, such as carpal tunnel syndrome. °· Shingles. °· Hypothyroidism. °· Vitamin B12 deficiencies. °· Alcoholism. °· Heavy metal poisoning (lead, arsenic). °· Rheumatoid arthritis. °· Systemic lupus erythematosus. °DIAGNOSIS  °Your caregiver will attempt to find the underlying cause of your paresthesia. Your caregiver may: °· Take your medical history. °· Perform a physical exam. °· Order various lab tests. °· Order imaging tests. °TREATMENT    °Treatment for paresthesia depends on the underlying cause. °HOME CARE INSTRUCTIONS °· Avoid drinking alcohol. °· You may consider massage or acupuncture to help relieve your symptoms. °· Keep all follow-up appointments as directed by your caregiver. °SEEK IMMEDIATE MEDICAL CARE IF:  °· You feel weak. °· You have trouble walking or moving. °· You have problems with speech or vision. °· You feel confused. °· You cannot control your bladder or bowel movements. °· You feel numbness after an injury. °· You faint. °· Your burning or prickling feeling gets worse when walking. °· You have pain, cramps, or dizziness. °· You develop a rash. °MAKE SURE YOU: °· Understand these instructions. °· Will watch your condition. °· Will get help right away if you are not doing well or get worse. °Document Released: 12/08/2001 Document Revised: 03/12/2011 Document Reviewed: 09/08/2010 °ExitCare® Patient Information ©2014 [employer]. ° °

## 2013-05-01 LAB — VITAMIN D 25 HYDROXY (VIT D DEFICIENCY, FRACTURES): Vit D, 25-Hydroxy: 29 ng/mL — ABNORMAL LOW (ref 30–89)

## 2013-05-04 LAB — HELICOBACTER PYLORI  ANTIBODY, IGM: Helicobacter pylori, IgM: 7.3 U/mL (ref <9.0)

## 2013-05-27 ENCOUNTER — Telehealth: Payer: Self-pay

## 2013-05-27 NOTE — Telephone Encounter (Signed)
PT WOULD LIKE DR KURT OR HIS ASST TO KNOW SHE IS STILL BLOWING UP DARK GREEN MUCUS AND IS STILL BLEEDING PLEASE CALL [phone]

## 2013-05-28 NOTE — Telephone Encounter (Signed)
Lm for pt to RTC it has been a month since her visit.

## 2013-06-03 ENCOUNTER — Ambulatory Visit (INDEPENDENT_AMBULATORY_CARE_PROVIDER_SITE_OTHER): Payer: 59 | Admitting: Family Medicine

## 2013-06-03 VITALS — BP 140/96 | HR 68 | Temp 98.4°F | Resp 16 | Ht 64.0 in | Wt 220.0 lb

## 2013-06-03 DIAGNOSIS — R1012 Left upper quadrant pain: Secondary | ICD-10-CM

## 2013-06-03 DIAGNOSIS — J3489 Other specified disorders of nose and nasal sinuses: Secondary | ICD-10-CM

## 2013-06-03 DIAGNOSIS — R079 Chest pain, unspecified: Secondary | ICD-10-CM

## 2013-06-03 DIAGNOSIS — R0981 Nasal congestion: Secondary | ICD-10-CM

## 2013-06-03 DIAGNOSIS — R040 Epistaxis: Secondary | ICD-10-CM

## 2013-06-03 LAB — POCT CBC
Granulocyte percent: 50.3 %G (ref 37–80)
HCT, POC: 35.3 % — AB (ref 37.7–47.9)
Hemoglobin: 11.4 g/dL — AB (ref 12.2–16.2)
Lymph, poc: 1.8 (ref 0.6–3.4)
MCH, POC: 29 pg (ref 27–31.2)
MCHC: 32.3 g/dL (ref 31.8–35.4)
MCV: 89.7 fL (ref 80–97)
MID (cbc): 0.3 (ref 0–0.9)
MPV: 10.2 fL (ref 0–99.8)
POC Granulocyte: 2.2 (ref 2–6.9)
POC LYMPH PERCENT: 42.6 %L (ref 10–50)
POC MID %: 7.1 %M (ref 0–12)
Platelet Count, POC: 228 10*3/uL (ref 142–424)
RBC: 3.93 M/uL — AB (ref 4.04–5.48)
RDW, POC: 16 %
WBC: 4.3 10*3/uL — AB (ref 4.6–10.2)

## 2013-06-03 LAB — COMPREHENSIVE METABOLIC PANEL
ALT: 10 U/L (ref 0–35)
AST: 18 U/L (ref 0–37)
Albumin: 4.5 g/dL (ref 3.5–5.2)
Alkaline Phosphatase: 87 U/L (ref 39–117)
BUN: 10 mg/dL (ref 6–23)
CO2: 26 mEq/L (ref 19–32)
Calcium: 9.6 mg/dL (ref 8.4–10.5)
Chloride: 104 mEq/L (ref 96–112)
Creat: 0.58 mg/dL (ref 0.50–1.10)
Glucose, Bld: 92 mg/dL (ref 70–99)
Potassium: 4.2 mEq/L (ref 3.5–5.3)
Sodium: 135 mEq/L (ref 135–145)
Total Bilirubin: 0.6 mg/dL (ref 0.2–1.2)
Total Protein: 7.6 g/dL (ref 6.0–8.3)

## 2013-06-03 NOTE — Addendum Note (Signed)
Addended by: Frutoso Chase A on: 06/03/2013 06:39 PM   Modules accepted: Orders

## 2013-06-03 NOTE — Progress Notes (Signed)
Subjective:    Patient ID: Brianna Hawkins, female    DOB: 1960/07/12, 53 y.o.   MRN: [id number]  HPI This chart was scribed for Brianna Haber, MD by Ladene Artist, ED Scribe. The patient was seen in room 1. Patient's care was started at 6:11 PM.  HPI Comments: Brianna Hawkins is a 53 y.o. female who presents to the [facility] complaining of constant nasal congestion over the past 2 months. Pt reports associated dizziness and nosebleed, specifically from the L nostril. Pt states that the first nosebleed lasted for a while and she noted clots with blowing her nose. Pt denies hearing loss. Pt states that she completed the prescribed antibiotics but the symptoms never resolved.   Pt also reports frequent, intermittent, sharp LUQ pain that radiates to under her rib cage and behind her L breast over the past 1.5 months. Pt states that the pain is so severe that it takes her breath away. She states that she feels bubble popping when the pain moves. Pain is exacerbated with taking deep breaths and certain movements. Pt had a colonoscopy in January 2015 and she denied pain at that time. Pt was diagnosed with GERD. Pt has seen a gastroenterologist in January.  Past Medical History  Diagnosis Date   Urinary urgency    Pain, joint, shoulder region, right    Headache(784.0)    Hypertension    Allergy    Blood transfusion    GERD (gastroesophageal reflux disease)    Heart murmur     " hole in heart "   Tuberculosis     7 YEARS AGO   Blood transfusion without reported diagnosis    Arthritis    Substance abuse    Current Outpatient Prescriptions on File Prior to Visit  Medication Sig Dispense Refill   amLODipine (NORVASC) 10 MG tablet Take 1 tablet (10 mg total) by mouth daily.  90 tablet  3   diclofenac (VOLTAREN) 75 MG EC tablet Take 1 tablet (75 mg total) by mouth 2 (two) times daily.  30 tablet  0   esomeprazole (NEXIUM) 40 MG capsule Take 1 capsule  (40 mg total) by mouth daily before breakfast.  30 capsule  5   fluticasone (FLONASE) 50 MCG/ACT nasal spray Place 2 sprays into both nostrils daily.  16 g  1   ipratropium (ATROVENT) 0.03 % nasal spray Place 2 sprays into the nose 2 (two) times daily.       LORazepam (ATIVAN) 0.5 MG tablet Take 1 tablet (0.5 mg total) by mouth at bedtime as needed for anxiety.  30 tablet  1   meclizine (ANTIVERT) 25 MG tablet Take 1 tablet (25 mg total) by mouth 3 (three) times daily as needed.  90 tablet  3   methocarbamol (ROBAXIN) 500 MG tablet Take 1 tablet (500 mg total) by mouth 2 (two) times daily as needed.  30 tablet  5   naproxen (NAPROSYN) 500 MG tablet Take 1 tablet (500 mg total) by mouth 2 (two) times daily with a meal.  30 tablet  0   naproxen sodium (ANAPROX DS) 550 MG tablet Take 1 tablet (550 mg total) by mouth 2 (two) times daily with a meal.  40 tablet  0   SUMAtriptan (IMITREX) 100 MG tablet Take 1 tablet (100 mg total) by mouth every 2 (two) hours as needed for migraine.  10 tablet  0   traMADol (ULTRAM) 50 MG tablet Take 1 tablet (50 mg  total) by mouth every 6 (six) hours as needed for pain.  50 tablet  5   acetaminophen-codeine (TYLENOL #3) 300-30 MG per tablet Take 1-2 tablets by mouth every 4 (four) hours as needed.  30 tablet  0   levofloxacin (LEVAQUIN) 500 MG tablet Take 1 tablet (500 mg total) by mouth daily.  7 tablet  0   Multiple Vitamins-Minerals (HAIR/SKIN/NAILS) TABS Take 1 tablet by mouth daily.       phentermine 15 MG capsule TAKE ONE CAPSULE BY MOUTH EVERY MORNING  30 capsule  0   predniSONE (DELTASONE) 20 MG tablet Take 1 tablet (20 mg total) by mouth daily with breakfast.  10 tablet  0   No current facility-administered medications on file prior to visit.   No Known Allergies   Review of Systems  HENT: Positive for congestion and nosebleeds. Negative for hearing loss.   Gastrointestinal: Positive for abdominal pain.  Genitourinary: Positive for flank  pain.  Neurological: Positive for dizziness.      Objective:   Physical Exam  Nursing note and vitals reviewed. Constitutional: She is oriented to person, place, and time. She appears well-developed and well-nourished. No distress.  HENT:  Head: Normocephalic and atraumatic.  Patient has narrowed nasal passages. There are white exudates behind the turbinates on the left with a small trickle of light overlying the turbinate.  Eyes: Conjunctivae and EOM are normal.  Neck: Normal range of motion.  Cardiovascular: Normal rate, regular rhythm and normal heart sounds.   Pulmonary/Chest: Effort normal and breath sounds normal. No respiratory distress.  Musculoskeletal: Normal range of motion.  Neurological: She is alert and oriented to person, place, and time.  Skin: Skin is warm and dry.  Psychiatric: She has a normal mood and affect. Her behavior is normal.      Assessment & Plan:   I personally performed the services described in this documentation, which was scribed in my presence. The recorded information has been reviewed and is accurate.  Epistaxis - Plan: Ambulatory referral to ENT, Comprehensive metabolic panel, POCT CBC  Nasal congestion - Plan: Ambulatory referral to ENT, Comprehensive metabolic panel, POCT CBC  Chest pain - Plan: CT Chest W Contrast, Comprehensive metabolic panel, POCT CBC  LUQ abdominal pain - Plan: CT Chest W Contrast, Comprehensive metabolic panel, POCT CBC  Signed, Brianna Haber, MD

## 2013-06-04 ENCOUNTER — Telehealth: Payer: Self-pay

## 2013-06-04 ENCOUNTER — Ambulatory Visit
Admission: RE | Admit: 2013-06-04 | Discharge: 2013-06-04 | Disposition: A | Discharge status: Home / Self Care | Payer: 59 | Source: Ambulatory Visit | Attending: Family Medicine | Admitting: Family Medicine

## 2013-06-04 DIAGNOSIS — R079 Chest pain, unspecified: Secondary | ICD-10-CM

## 2013-06-04 DIAGNOSIS — R1012 Left upper quadrant pain: Secondary | ICD-10-CM

## 2013-06-04 MED ORDER — IOHEXOL 350 MG/ML SOLN: 100.0000 mL | Freq: Once | INTRAVENOUS | Status: AC | PRN

## 2013-06-04 NOTE — Telephone Encounter (Signed)
Dr. Carlean Jews, pt calling to find out what her CT said. Please review. Thanks

## 2013-06-07 NOTE — Telephone Encounter (Signed)
See CT report.

## 2013-06-08 ENCOUNTER — Telehealth: Payer: Self-pay

## 2013-06-08 DIAGNOSIS — J329 Chronic sinusitis, unspecified: Secondary | ICD-10-CM

## 2013-06-08 MED ORDER — LEVOFLOXACIN 500 MG PO TABS: 500.0000 mg | ORAL_TABLET | Freq: Every day | ORAL | Status: DC

## 2013-06-08 NOTE — Telephone Encounter (Signed)
Spoke to pt- appt is pending at ENT Wednesday at 9:45am.. She is having pain with breathing. She states she has been having the pain with movement. She is not coughing. But she is very congested in her nose and chest. States that she has dark green sputum. She states Dr L was going to call something in to the pharmacy for her to get rid of the infection. Nothing was called in because he wanted to check kidney function. Please advise.

## 2013-06-08 NOTE — Telephone Encounter (Signed)
PT STATES SHE NEED SOMETHING FOR PAIN AND WOULD LIKE TO KNOW IF THE INFECTION IN HER NOSE IS WHAT'S CAUSING HER CHEST TO HURT, SHE IS IN BED BECAUSE OF THE PAIN PLEASE CALL [phone]     [place]

## 2013-06-08 NOTE — Telephone Encounter (Signed)
I have refilled the levaquin.

## 2013-06-08 NOTE — Telephone Encounter (Signed)
Dr. Carlean Jews please advise on antibiotic for this patient

## 2013-06-09 NOTE — Telephone Encounter (Signed)
Pt.notified

## 2013-06-10 ENCOUNTER — Other Ambulatory Visit: Payer: Self-pay | Admitting: Otolaryngology

## 2013-06-10 DIAGNOSIS — R51 Headache: Secondary | ICD-10-CM

## 2013-06-10 DIAGNOSIS — R0981 Nasal congestion: Secondary | ICD-10-CM

## 2013-06-10 DIAGNOSIS — R519 Headache, unspecified: Secondary | ICD-10-CM

## 2013-06-12 ENCOUNTER — Ambulatory Visit
Admission: RE | Admit: 2013-06-12 | Discharge: 2013-06-12 | Disposition: A | Discharge status: Home / Self Care | Payer: 59 | Source: Ambulatory Visit | Attending: Otolaryngology | Admitting: Otolaryngology

## 2013-06-12 DIAGNOSIS — R0981 Nasal congestion: Secondary | ICD-10-CM

## 2013-06-12 DIAGNOSIS — R519 Headache, unspecified: Secondary | ICD-10-CM

## 2013-06-12 DIAGNOSIS — R51 Headache: Secondary | ICD-10-CM

## 2013-06-17 ENCOUNTER — Ambulatory Visit (INDEPENDENT_AMBULATORY_CARE_PROVIDER_SITE_OTHER): Payer: 59 | Admitting: Family Medicine

## 2013-06-17 VITALS — BP 155/88 | HR 76 | Temp 98.2°F | Resp 16 | Ht 65.0 in | Wt 222.8 lb

## 2013-06-17 DIAGNOSIS — R42 Dizziness and giddiness: Secondary | ICD-10-CM

## 2013-06-17 DIAGNOSIS — R5383 Other fatigue: Secondary | ICD-10-CM

## 2013-06-17 DIAGNOSIS — I10 Essential (primary) hypertension: Secondary | ICD-10-CM

## 2013-06-17 DIAGNOSIS — R5381 Other malaise: Secondary | ICD-10-CM

## 2013-06-17 MED ORDER — HYDROCHLOROTHIAZIDE 12.5 MG PO CAPS: 12.5000 mg | ORAL_CAPSULE | Freq: Every day | ORAL | Status: DC

## 2013-06-17 MED ORDER — BUPROPION HCL 75 MG PO TABS: 75.0000 mg | ORAL_TABLET | Freq: Every day | ORAL | Status: DC

## 2013-06-17 NOTE — Patient Instructions (Signed)
CLINICAL DATA: 53 year old female with frontal pain and congestion  x2 months. No relief with antibiotics. Headache. Preoperative study  for stereotactic surgical planning.  EXAM:  CT MAXILLOFACIAL WITHOUT CONTRAST  TECHNIQUE:  Multidetector CT imaging of the maxillofacial structures was  performed. Multiplanar CT image reconstructions were also generated.  A small metallic BB was placed on the right temple in order to  reliably differentiate right from left.  COMPARISON: Head CT without contrast 08/13/2008.  FINDINGS:  Grossly stable and negative visible non contrast brain parenchyma.  Mild hyperostosis of the calvarium. Occasional dural calcifications.  Visualized orbits and scalp soft tissues are within normal limits.  Negative visualized deep soft tissue spaces of the face.  Tympanic cavities, mastoids, and pneumatized petrous apex air cells  are clear.  The sphenoid sinuses are clear. The right sphenoid wing is hyper  pneumatized.  Right side paranasal sinuses are clear throughout. The left frontal  sinus is clear.  The left maxillary sinus is airless and expanded. There is medial  bowing of the medial wall of the sinus nearly abutting the nasal  septum (series 104, image 33). Subsequent anatomic distortion at the  left [facility]. Despite this, the left ethmoid air cells remain clear.  There is superimposed leftward anterior nasal septal deviation.  No acute osseous abnormality identified.  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 Other  paranasal sinuses are clear.  Electronically Signed  By: Lars Pinks M.D.  On: 06/12/2013 09:15   Results for orders placed in visit on 06/03/13  COMPREHENSIVE METABOLIC PANEL      Result Value Ref Range   Sodium 135  135 - 145 mEq/L   Potassium 4.2  3.5 - 5.3 mEq/L   Chloride 104  96 - 112 mEq/L   CO2 26  19 - 32 mEq/L   Glucose, Bld 92  70 - 99 mg/dL   BUN 10  6 - 23 mg/dL   Creat 0.58  0.50 - 1.10 mg/dL   Total  Bilirubin 0.6  0.2 - 1.2 mg/dL   Alkaline Phosphatase 87  39 - 117 U/L   AST 18  0 - 37 U/L   ALT 10  0 - 35 U/L   Total Protein 7.6  6.0 - 8.3 g/dL   Albumin 4.5  3.5 - 5.2 g/dL   Calcium 9.6  8.4 - 10.5 mg/dL  POCT CBC      Result Value Ref Range   WBC 4.3 (*) 4.6 - 10.2 K/uL   Lymph, poc 1.8  0.6 - 3.4   POC LYMPH PERCENT 42.6  10 - 50 %L   MID (cbc) 0.3  0 - 0.9   POC MID % 7.1  0 - 12 %M   POC Granulocyte 2.2  2 - 6.9   Granulocyte percent 50.3  37 - 80 %G   RBC 3.93 (*) 4.04 - 5.48 M/uL   Hemoglobin 11.4 (*) 12.2 - 16.2 g/dL   HCT, POC 35.3 (*) 37.7 - 47.9 %   MCV 89.7  80 - 97 fL   MCH, POC 29.0  27 - 31.2 pg   MCHC 32.3  31.8 - 35.4 g/dL   RDW, POC 16.0     Platelet Count, POC 228  142 - 424 K/uL   MPV 10.2  0 - 99.8 fL

## 2013-06-17 NOTE — Progress Notes (Signed)
This is a 53 year old is Brianna Hawkins employee who comes in with intermittent dizziness, headaches, and right nostril stuffiness. The headaches occur 2-3 times a month and are associated somewhat with nausea.  She seen recently with an elevated blood pressure and headaches. CAT scan was done showing a mucocele in the left side. She then saw Dr. Lucia Gaskins who suggested she might need surgery for tumors.  She also complains of fatigue. She's sleeping okay. She was like something to help her have more energy.  Objective: Blood pressure recheck today was 156/88. Patient is calm and in no distress.  HEENT: Moderate nasal obstruction the left, some dullness of both TMs suggesting of thickening of the tympanic membranes. Oropharynx clear. Neck: Supple no adenopathy or thyromegaly good range of motion Chest: Clear Heart: Regular no murmur Abdomen: Soft nontender    TECHNIQUE:  Multidetector CT imaging of the maxillofacial structures was  performed. Multiplanar CT image reconstructions were also generated.  A small metallic BB was placed on the right temple in order to  reliably differentiate right from left.  COMPARISON: Head CT without contrast 08/13/2008.  FINDINGS:  Grossly stable and negative visible non contrast brain parenchyma.  Mild hyperostosis of the calvarium. Occasional dural calcifications.  Visualized orbits and scalp soft tissues are within normal limits.  Negative visualized deep soft tissue spaces of the face.  Tympanic cavities, mastoids, and pneumatized petrous apex air cells  are clear.  The sphenoid sinuses are clear. The right sphenoid wing is hyper  pneumatized.  Right side paranasal sinuses are clear throughout. The left frontal  sinus is clear.  The left maxillary sinus is airless and expanded. There is medial  bowing of the medial wall of the sinus nearly abutting the nasal  septum (series 104, image 33). Subsequent anatomic distortion at the  left [facility]. Despite  this, the left ethmoid air cells remain clear.  There is superimposed leftward anterior nasal septal deviation.  No acute osseous abnormality identified.  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 Other  paranasal sinuses are clear.  Electronically Signed  By: Lars Pinks M.D.  On: 06/12/2013 09:15  Results for orders placed in visit on 06/03/13  COMPREHENSIVE METABOLIC PANEL      Result Value Ref Range   Sodium 135  135 - 145 mEq/L   Potassium 4.2  3.5 - 5.3 mEq/L   Chloride 104  96 - 112 mEq/L   CO2 26  19 - 32 mEq/L   Glucose, Bld 92  70 - 99 mg/dL   BUN 10  6 - 23 mg/dL   Creat 0.58  0.50 - 1.10 mg/dL   Total Bilirubin 0.6  0.2 - 1.2 mg/dL   Alkaline Phosphatase 87  39 - 117 U/L   AST 18  0 - 37 U/L   ALT 10  0 - 35 U/L   Total Protein 7.6  6.0 - 8.3 g/dL   Albumin 4.5  3.5 - 5.2 g/dL   Calcium 9.6  8.4 - 10.5 mg/dL  POCT CBC      Result Value Ref Range   WBC 4.3 (*) 4.6 - 10.2 K/uL   Lymph, poc 1.8  0.6 - 3.4   POC LYMPH PERCENT 42.6  10 - 50 %L   MID (cbc) 0.3  0 - 0.9   POC MID % 7.1  0 - 12 %M   POC Granulocyte 2.2  2 - 6.9   Granulocyte percent 50.3  37 - 80 %G  RBC 3.93 (*) 4.04 - 5.48 M/uL   Hemoglobin 11.4 (*) 12.2 - 16.2 g/dL   HCT, POC 35.3 (*) 37.7 - 47.9 %   MCV 89.7  80 - 97 fL   MCH, POC 29.0  27 - 31.2 pg   MCHC 32.3  31.8 - 35.4 g/dL   RDW, POC 16.0     Platelet Count, POC 228  142 - 424 K/uL   MPV 10.2  0 - 99.8 fL   Assessment: Chronic headaches and intermittent vertigo of unexplained etiology. I'm not sure that the mucocele and explains her symptoms, so I am having her follow up with Dr. Lucia Gaskins tomorrow to have him explain whether he thinks surgery would be a good idea.  Her blood pressure continues to be on the high side so I think getting hydrochlorothiazide should help.  The patient is possibly slightly depressed. I have ordered some Wellbutrin for her to see if that will help.  Plan: Hypertension - Plan:  hydrochlorothiazide (MICROZIDE) 12.5 MG capsule  Vertigo - Plan: hydrochlorothiazide (MICROZIDE) 12.5 MG capsule  Other malaise and fatigue - Plan: buPROPion (WELLBUTRIN) 75 MG tablet Disability forms were completed showing the patient needs to be off to 3 days every month because of headaches and vertigo. Signed, [name], MD

## 2013-06-19 ENCOUNTER — Emergency Department (HOSPITAL_COMMUNITY): Payer: 59

## 2013-06-19 ENCOUNTER — Encounter (HOSPITAL_COMMUNITY): Payer: Self-pay | Admitting: Emergency Medicine

## 2013-06-19 ENCOUNTER — Emergency Department (HOSPITAL_COMMUNITY)
Admission: EM | Admit: 2013-06-19 | Discharge: 2013-06-19 | Disposition: A | Discharge status: Home / Self Care | Payer: 59 | Attending: Emergency Medicine | Admitting: Emergency Medicine

## 2013-06-19 DIAGNOSIS — S99929A Unspecified injury of unspecified foot, initial encounter: Principal | ICD-10-CM

## 2013-06-19 DIAGNOSIS — K219 Gastro-esophageal reflux disease without esophagitis: Secondary | ICD-10-CM | POA: Insufficient documentation

## 2013-06-19 DIAGNOSIS — Z87891 Personal history of nicotine dependence: Secondary | ICD-10-CM | POA: Insufficient documentation

## 2013-06-19 DIAGNOSIS — Y9389 Activity, other specified: Secondary | ICD-10-CM | POA: Insufficient documentation

## 2013-06-19 DIAGNOSIS — S99919A Unspecified injury of unspecified ankle, initial encounter: Principal | ICD-10-CM

## 2013-06-19 DIAGNOSIS — Y929 Unspecified place or not applicable: Secondary | ICD-10-CM | POA: Insufficient documentation

## 2013-06-19 DIAGNOSIS — Z79899 Other long term (current) drug therapy: Secondary | ICD-10-CM | POA: Insufficient documentation

## 2013-06-19 DIAGNOSIS — M79672 Pain in left foot: Secondary | ICD-10-CM

## 2013-06-19 DIAGNOSIS — Z5189 Encounter for other specified aftercare: Secondary | ICD-10-CM | POA: Insufficient documentation

## 2013-06-19 DIAGNOSIS — S8990XA Unspecified injury of unspecified lower leg, initial encounter: Secondary | ICD-10-CM | POA: Insufficient documentation

## 2013-06-19 DIAGNOSIS — R011 Cardiac murmur, unspecified: Secondary | ICD-10-CM | POA: Insufficient documentation

## 2013-06-19 DIAGNOSIS — Z87448 Personal history of other diseases of urinary system: Secondary | ICD-10-CM | POA: Insufficient documentation

## 2013-06-19 DIAGNOSIS — Z8611 Personal history of tuberculosis: Secondary | ICD-10-CM | POA: Insufficient documentation

## 2013-06-19 DIAGNOSIS — I10 Essential (primary) hypertension: Secondary | ICD-10-CM | POA: Insufficient documentation

## 2013-06-19 DIAGNOSIS — W010XXA Fall on same level from slipping, tripping and stumbling without subsequent striking against object, initial encounter: Secondary | ICD-10-CM | POA: Insufficient documentation

## 2013-06-19 DIAGNOSIS — M129 Arthropathy, unspecified: Secondary | ICD-10-CM | POA: Insufficient documentation

## 2013-06-19 MED ORDER — IBUPROFEN 400 MG PO TABS
400.0000 mg | ORAL_TABLET | Freq: Once | ORAL | Status: AC
  Administered 2013-06-19: 400 mg via ORAL
  Filled 2013-06-19: qty 1

## 2013-06-19 NOTE — ED Notes (Signed)
Pt states that she tripped over a stair stepper and has a knot on her foot for two weeks. Pt states that the knot continues to grow. Pt here for x-rays.

## 2013-06-19 NOTE — ED Provider Notes (Signed)
Medical screening examination/treatment/procedure(s) were performed by non-physician practitioner and as supervising physician I was immediately available for consultation/collaboration.   EKG Interpretation None        Wandra Arthurs, MD 06/19/13 [phone]

## 2013-06-19 NOTE — Discharge Instructions (Signed)
Cryotherapy Cryotherapy means treatment with cold. Ice or gel packs can be used to reduce both pain and swelling. Ice is the most helpful within the first 24 to 48 hours after an injury or flareup from overusing a muscle or joint. Sprains, strains, spasms, burning pain, shooting pain, and aches can all be eased with ice. Ice can also be used when recovering from surgery. Ice is effective, has very few side effects, and is safe for most people to use. PRECAUTIONS  Ice is not a safe treatment option for people with:  Raynaud's phenomenon. This is a condition affecting small blood vessels in the extremities. Exposure to cold may cause your problems to return.  Cold hypersensitivity. There are many forms of cold hypersensitivity, including:  Cold urticaria. Red, itchy hives appear on the skin when the tissues begin to warm after being iced.  Cold erythema. This is a red, itchy rash caused by exposure to cold.  Cold hemoglobinuria. Red blood cells break down when the tissues begin to warm after being iced. The hemoglobin that carry oxygen are passed into the urine because they cannot combine with blood proteins fast enough.  Numbness or altered sensitivity in the area being iced. If you have any of the following conditions, do not use ice until you have discussed cryotherapy with your caregiver:  Heart conditions, such as arrhythmia, angina, or chronic heart disease.  High blood pressure.  Healing wounds or open skin in the area being iced.  Current infections.  Rheumatoid arthritis.  Poor circulation.  Diabetes. Ice slows the blood flow in the region it is applied. This is beneficial when trying to stop inflamed tissues from spreading irritating chemicals to surrounding tissues. However, if you expose your skin to cold temperatures for too long or without the proper protection, you can damage your skin or nerves. Watch for signs of skin damage due to cold. HOME CARE INSTRUCTIONS Follow  these tips to use ice and cold packs safely.  Place a dry or damp towel between the ice and skin. A damp towel will cool the skin more quickly, so you may need to shorten the time that the ice is used.  For a more rapid response, add gentle compression to the ice.  Ice for no more than 10 to 20 minutes at a time. The bonier the area you are icing, the less time it will take to get the benefits of ice.  Check your skin after 5 minutes to make sure there are no signs of a poor response to cold or skin damage.  Rest 20 minutes or more in between uses.  Once your skin is numb, you can end your treatment. You can test numbness by very lightly touching your skin. The touch should be so light that you do not see the skin dimple from the pressure of your fingertip. When using ice, most people will feel these normal sensations in this order: cold, burning, aching, and numbness.  Do not use ice on someone who cannot communicate their responses to pain, such as small children or people with dementia. HOW TO MAKE AN ICE PACK Ice packs are the most common way to use ice therapy. Other methods include ice massage, ice baths, and cryo-sprays. Muscle creams that cause a cold, tingly feeling do not offer the same benefits that ice offers and should not be used as a substitute unless recommended by your caregiver. To make an ice pack, do one of the following:  Place crushed ice or  a bag of frozen vegetables in a sealable plastic bag. Squeeze out the excess air. Place this bag inside another plastic bag. Slide the bag into a pillowcase or place a damp towel between your skin and the bag.  Mix 3 parts water with 1 part rubbing alcohol. Freeze the mixture in a sealable plastic bag. When you remove the mixture from the freezer, it will be slushy. Squeeze out the excess air. Place this bag inside another plastic bag. Slide the bag into a pillowcase or place a damp towel between your skin and the bag. SEEK MEDICAL  CARE IF:  You develop white spots on your skin. This may give the skin a blotchy (mottled) appearance.  Your skin turns blue or pale.  Your skin becomes waxy or hard.  Your swelling gets worse. MAKE SURE YOU:   Understand these instructions.  Will watch your condition.  Will get help right away if you are not doing well or get worse. Document Released: 08/14/2010 Document Revised: 03/12/2011 Document Reviewed: 08/14/2010 [facility] Patient Information 2015 [place], [place]. This information is not intended to replace advice given to you by your health care provider. Make sure you discuss any questions you have with your health care provider.

## 2013-06-19 NOTE — ED Provider Notes (Signed)
CSN: [id number]     Arrival date & time 06/19/13  0011 History   First MD Initiated Contact with Patient 06/19/13 0027     Chief Complaint  Patient presents with  . Foot Pain     (Consider location/radiation/quality/duration/timing/severity/associated sxs/prior Treatment) HPI Comments: Patient presents to the emergency department with chief complaint of left foot and ankle pain. She states that she tripped and fell about 2 weeks ago. She has noticed persistent pain, as well as a knot on her foot but continues to grow. Pain is moderate. It is worsened with palpation or movement. She has tried taking ibuprofen with some relief. It is relieved with rest.  The history is provided by the patient. No language interpreter was used.    Past Medical History  Diagnosis Date  . Urinary urgency   . Pain, joint, shoulder region, right   . Headache(784.0)   . Hypertension   . Allergy   . Blood transfusion   . GERD (gastroesophageal reflux disease)   . Heart murmur     " hole in heart "  . Tuberculosis     7 YEARS AGO  . Blood transfusion without reported diagnosis   . Arthritis   . Substance abuse    Past Surgical History  Procedure Laterality Date  . Tubal ligation    . Breast lumpectomy      Underarm   Family History  Problem Relation Age of Onset  . Throat cancer Father     was a smoker  . Colon cancer Father   . Dementia Other   . HIV Sister   . Stroke Mother   . Diabetes Paternal Grandmother   . Diabetes Maternal Grandmother    History  Substance Use Topics  . Smoking status: Former Smoker -- 0.30 packs/day for 15 years    Types: Cigarettes    Quit date: 01/01/2001  . Smokeless tobacco: Never Used     Comment: "never inhaled"  . Alcohol Use: Yes   OB History   Grav Para Term Preterm Abortions TAB SAB Ect Mult Living                 Review of Systems  Constitutional: Negative for fever and chills.  Respiratory: Negative for shortness of breath.    Cardiovascular: Negative for chest pain.  Gastrointestinal: Negative for nausea, vomiting, diarrhea and constipation.  Genitourinary: Negative for dysuria.  Musculoskeletal: Positive for arthralgias.      Allergies  Review of patient's allergies indicates no known allergies.  Home Medications   Prior to Admission medications   Medication Sig Start Date End Date Taking? Authorizing Provider  acetaminophen-codeine (TYLENOL #3) 300-30 MG per tablet Take 1-2 tablets by mouth every 4 (four) hours as needed. 03/15/13   Ellison Carwin, MD  amLODipine (NORVASC) 10 MG tablet Take 1 tablet (10 mg total) by mouth daily. 07/07/12   [name], MD  buPROPion [facility]) 75 MG tablet Take 1 tablet (75 mg total) by mouth daily with breakfast. 06/17/13   [name], MD  esomeprazole (NEXIUM) 40 MG capsule Take 1 capsule (40 mg total) by mouth daily before breakfast. 04/30/13   [name], MD  fluticasone [facility]) 50 MCG/ACT nasal spray Place 2 sprays into both nostrils daily. 04/02/13   [name], PA-C  hydrochlorothiazide (MICROZIDE) 12.5 MG capsule Take 1 capsule (12.5 mg total) by mouth daily. 06/17/13   [name], MD  ipratropium (ATROVENT) 0.03 % nasal spray Place 2 sprays into the nose 2 (  two) times daily.    Historical Provider, MD  LORazepam (ATIVAN) 0.5 MG tablet Take 1 tablet (0.5 mg total) by mouth at bedtime as needed for anxiety. 04/30/13   [name], MD  meclizine (ANTIVERT) 25 MG tablet Take 1 tablet (25 mg total) by mouth 3 (three) times daily as needed. 07/07/12   [name], MD  methocarbamol (ROBAXIN) 500 MG tablet Take 1 tablet (500 mg total) by mouth 2 (two) times daily as needed. 07/07/12   [name], MD  Multiple Vitamins-Minerals (HAIR/SKIN/NAILS) TABS Take 1 tablet by mouth daily.    Historical Provider, MD  naproxen sodium (ANAPROX DS) 550 MG tablet Take 1 tablet (550 mg total) by mouth 2 (two) times daily with a meal. 03/15/13 03/15/14  Ellison Carwin, MD  SUMAtriptan (IMITREX) 100 MG tablet Take 1 tablet (100 mg total) by mouth every 2 (two) hours as needed for migraine. 10/03/12   [name], MD  traMADol (ULTRAM) 50 MG tablet Take 1 tablet (50 mg total) by mouth every 6 (six) hours as needed for pain. 07/07/12   [name], MD   BP 154/113  Pulse 75  Temp(Src) 98.8 F (37.1 C)  Resp 18  Wt 224 lb 8 oz (101.833 kg)  SpO2 96%  LMP 04/04/2013 Physical Exam  Nursing note and vitals reviewed. Constitutional: She is oriented to person, place, and time. She appears well-developed and well-nourished.  HENT:  Head: Normocephalic and atraumatic.  Eyes: Conjunctivae and EOM are normal.  Neck: Normal range of motion.  Cardiovascular: Normal rate.   Distal pulses intact, with brisk capillary refill  Pulmonary/Chest: Effort normal.  Abdominal: She exhibits no distension.  Musculoskeletal: Normal range of motion.  Left foot tender to palpation over the midfoot, with deformity over the second metatarsal, also tenderness to palpation over the lateral aspect of the ankle  Range of motion and strength of the left foot and toes 5/5  Neurological: She is alert and oriented to person, place, and time.  Sensation intact  Skin: Skin is dry.  Psychiatric: She has a normal mood and affect. Her behavior is normal. Judgment and thought content normal.    ED Course  Procedures (including critical care time) Labs Review Labs Reviewed - No data to display  Imaging Review No results found.   EKG Interpretation None      MDM   Final diagnoses:  Foot pain, left    Patient with left foot pain. Plain films are negative. Upon, as possibly a fibroma, recommend she followup in one week with PCP. She stable and ready for discharge.    Montine Circle, PA-C 06/19/13 [phone]

## 2013-06-22 ENCOUNTER — Ambulatory Visit (INDEPENDENT_AMBULATORY_CARE_PROVIDER_SITE_OTHER): Payer: 59 | Admitting: Family Medicine

## 2013-06-22 VITALS — BP 122/82 | HR 74 | Temp 98.3°F | Resp 18 | Ht 65.5 in | Wt 220.6 lb

## 2013-06-22 DIAGNOSIS — M67472 Ganglion, left ankle and foot: Secondary | ICD-10-CM

## 2013-06-22 DIAGNOSIS — M6740 Ganglion, unspecified site: Secondary | ICD-10-CM

## 2013-06-22 MED ORDER — HYDROCODONE-ACETAMINOPHEN 5-325 MG PO TABS: 1.0000 | ORAL_TABLET | Freq: Four times a day (QID) | ORAL | Status: DC | PRN

## 2013-06-22 NOTE — Progress Notes (Signed)
53 yo woman who fell on stair stepper 3 weeks ago.  Went to ED who recommended ice, no meds prescribed, x-rays negative.  Foot is worsening with several days of numbness in great toe and adjacent toe.  Objective:  NAD Left dorsal foot has hard 1/2 cm, tender swelling just proximal to 1st digital web space.      CLINICAL DATA: Foot pain.  EXAM:  LEFT FOOT - COMPLETE 3+ VIEW  COMPARISON: None.  FINDINGS:  Anatomic alignment. No fracture. Soft tissue swelling is present  over the dorsum of the forefoot.  IMPRESSION:  No acute osseous abnormality.  Electronically Signed  By: Dereck Ligas M.D.  On: 06/19/2013 01:06   Suspect there is either ganglion cyst forming or a dorsalis pedal aneurysm.  Plan:  Orthopedic/ podiatry referral Has appointment with podiatrist for July 9  [name], MD

## 2013-06-23 ENCOUNTER — Telehealth: Payer: Self-pay

## 2013-06-23 MED ORDER — IBUPROFEN 800 MG PO TABS: 800.0000 mg | ORAL_TABLET | Freq: Three times a day (TID) | ORAL | Status: DC | PRN

## 2013-06-23 NOTE — Telephone Encounter (Signed)
Patient called and wants to know if someone can call her in ibuprofen 800mg  that she can take while she's at work on her feet all day. Please return call and advise.

## 2013-06-23 NOTE — Telephone Encounter (Signed)
I sent to her pharmacy.

## 2013-06-24 NOTE — Telephone Encounter (Signed)
Lm advise pt rx sent to the pharmacy

## 2013-07-06 DIAGNOSIS — J329 Chronic sinusitis, unspecified: Secondary | ICD-10-CM | POA: Insufficient documentation

## 2013-07-07 ENCOUNTER — Encounter: Payer: Self-pay | Admitting: Podiatry

## 2013-07-07 ENCOUNTER — Ambulatory Visit: Payer: Self-pay

## 2013-07-07 ENCOUNTER — Ambulatory Visit (INDEPENDENT_AMBULATORY_CARE_PROVIDER_SITE_OTHER): Payer: 59 | Admitting: Podiatry

## 2013-07-07 VITALS — BP 126/83 | HR 74 | Resp 16 | Ht 65.0 in | Wt 226.0 lb

## 2013-07-07 DIAGNOSIS — M79672 Pain in left foot: Secondary | ICD-10-CM

## 2013-07-07 DIAGNOSIS — M79609 Pain in unspecified limb: Secondary | ICD-10-CM

## 2013-07-07 DIAGNOSIS — M6740 Ganglion, unspecified site: Secondary | ICD-10-CM

## 2013-07-07 NOTE — Progress Notes (Signed)
   Subjective:    Patient ID: Brianna Hawkins, female    DOB: [birth year], 53 y.o.   MRN: [id number]  HPI Comments: "I tripped over the exercise equipment"  Patient c/o aching forefoot and dorsal midfoot left for about 1 month. She tripped and injured it. She has had some swelling especially with shoes. She presents today with a velcro surgical shoe. She had a knot on top as well. That has since went down. Some numbness between the 1st and 2nd toes on the left. Saw PCP evaluated - Rx'd pain pill and Ibuprofen 800 mg. Initially, did go to ER and they xrayed.  Foot Injury  Associated symptoms include numbness.      Review of Systems  Constitutional: Positive for diaphoresis.  HENT: Positive for sinus pressure.   Eyes: Positive for visual disturbance.  Respiratory: Positive for apnea.   Cardiovascular: Positive for chest pain.  Gastrointestinal: Positive for abdominal pain and abdominal distention.  Endocrine: Positive for heat intolerance.  Genitourinary: Positive for urgency.  Musculoskeletal: Positive for arthralgias, back pain and myalgias.  Neurological: Positive for dizziness, weakness, numbness and headaches.  Hematological: Bruises/bleeds easily.  All other systems reviewed and are negative.      Objective:   Physical Exam: I have reviewed her past medical history medications allergies surgeries social history and review of systems. Pulses are strongly palpable bilateral. Neurologic sensorium is intact per since once the monofilament with exception of the deep peroneal nerve of the left foot. Deep tendon reflexes are intact bilateral muscle strength is 5 over 5 dorsiflexors plantar flexors inverters everters all intrinsic musculature is intact. Orthopedic evaluation demonstrates all joints distal to the ankle have a full range of motion without crepitation. She does have a soft tissue nonpulsatile nodule between the first and second metatarsals at the base. This is gone down  substantially from digital pictures that she had taken in time and displaced me today in the office. No other obvious cutaneous problems. Radiographic evaluation does not demonstrate any type of osseous abnormalities in this area.        Assessment & Plan:  Assessment: Ganglion cyst first intermetatarsal space left foot. With deep peroneal nerve entrapment.  Plan: Discussed etiology pathology conservative versus surgical therapies. Try to aspirate the lesion today but we could not drainage. We did injected with Kenalog and local anesthetic. Explained to her that this did not make it feel better we would have to go surgery to remove.

## 2013-07-08 ENCOUNTER — Other Ambulatory Visit: Payer: Self-pay | Admitting: Otolaryngology

## 2013-07-08 NOTE — H&P (Signed)
PREOPERATIVE H&P  Chief Complaint: headache and chronic left sinus obstruction  HPI: Brianna Hawkins is a 53 y.o. female who presents for evaluation of headache and left sinus obstruction. CT scan of the sinus demonstrates a left maxillary sinus mucocele and septal deviation to the left. She's taken to the OR for left FESS and turbinate reductions.  Past Medical History  Diagnosis Date  . Urinary urgency   . Pain, joint, shoulder region, right   . Headache(784.0)   . Hypertension   . Allergy   . Blood transfusion   . GERD (gastroesophageal reflux disease)   . Heart murmur     " hole in heart "  . Tuberculosis     7 YEARS AGO  . Blood transfusion without reported diagnosis   . Arthritis   . Substance abuse    Past Surgical History  Procedure Laterality Date  . Tubal ligation    . Breast lumpectomy      Underarm   History   Social History  . Marital Status: Single    Spouse Name: N/A    Number of Children: 4  . Years of Education: N/A   Occupational History  . [employer]   Social History Main Topics  . Smoking status: Former Smoker -- 0.30 packs/day for 15 years    Types: Cigarettes    Quit date: 01/01/2001  . Smokeless tobacco: Never Used     Comment: "never inhaled"  . Alcohol Use: No  . Drug Use: No  . Sexual Activity: Yes   Other Topics Concern  . Not on file   Social History Narrative   Single. Education: [employer].   Family History  Problem Relation Age of Onset  . Throat cancer Father     was a smoker  . Colon cancer Father   . Dementia Other   . HIV Sister   . Stroke Mother   . Diabetes Paternal Grandmother   . Diabetes Maternal Grandmother    No Known Allergies Prior to Admission medications   Medication Sig Start Date End Date Taking? Authorizing Provider  acetaminophen-codeine (TYLENOL #3) 300-30 MG per tablet Take 1-2 tablets by mouth every 4 (four) hours as needed. 03/15/13   Ellison Carwin, MD  amLODipine  (NORVASC) 10 MG tablet Take 1 tablet (10 mg total) by mouth daily. 07/07/12   [name], MD  buPROPion [facility]) 75 MG tablet Take 1 tablet (75 mg total) by mouth daily with breakfast. 06/17/13   [name], MD  esomeprazole (NEXIUM) 40 MG capsule Take 1 capsule (40 mg total) by mouth daily before breakfast. 04/30/13   [name], MD  fluticasone [facility]) 50 MCG/ACT nasal spray Place 2 sprays into both nostrils daily. 04/02/13   [name], PA-C  hydrochlorothiazide (MICROZIDE) 12.5 MG capsule Take 1 capsule (12.5 mg total) by mouth daily. 06/17/13   [name], MD  HYDROcodone-acetaminophen (NORCO) 5-325 MG per tablet Take 1 tablet by mouth every 6 (six) hours as needed for moderate pain. 06/22/13   [name], MD  ibuprofen (ADVIL,MOTRIN) 800 MG tablet Take 1 tablet (800 mg total) by mouth every 8 (eight) hours as needed. 06/23/13   Mancel Bale, PA-C  ipratropium (ATROVENT) 0.03 % nasal spray Place 2 sprays into the nose 2 (two) times daily.    Historical Provider, MD  LORazepam (ATIVAN) 0.5 MG tablet Take 1 tablet (0.5 mg total) by mouth at bedtime as needed for anxiety. 04/30/13   [name],  MD  meclizine (ANTIVERT) 25 MG tablet Take 1 tablet (25 mg total) by mouth 3 (three) times daily as needed. 07/07/12   [name], MD  methocarbamol (ROBAXIN) 500 MG tablet Take 1 tablet (500 mg total) by mouth 2 (two) times daily as needed. 07/07/12   [name], MD  Multiple Vitamins-Minerals (HAIR/SKIN/NAILS) TABS Take 1 tablet by mouth daily.    Historical Provider, MD  naproxen sodium (ANAPROX DS) 550 MG tablet Take 1 tablet (550 mg total) by mouth 2 (two) times daily with a meal. 03/15/13 03/15/14  Ellison Carwin, MD  SUMAtriptan (IMITREX) 100 MG tablet Take 1 tablet (100 mg total) by mouth every 2 (two) hours as needed for migraine. 10/03/12   [name], MD  traMADol (ULTRAM) 50 MG tablet Take 1 tablet (50 mg total) by mouth every 6 (six) hours as needed for pain.  07/07/12   [name], MD     Positive ROS: left sided nasal obstruction and headache  All other systems have been reviewed and were otherwise negative with the exception of those mentioned in the HPI and as above.  Physical Exam: There were no vitals filed for this visit.  General: Alert, no acute distress Oral: Normal oral mucosa and tonsils Nasal: Left nasal passage a little narrower than the right. Polyp arising from the left middle meatus. Neck: No palpable adenopathy or thyroid nodules Ear: Ear canal is clear with normal appearing TMs Cardiovascular: Regular rate and rhythm, no murmur.  Respiratory: Clear to auscultation Neurologic: Alert and oriented x 3   Assessment/Plan: CHRONIC ETHMOIDAL SINUSITIS, POLY LEFT NASAL CAVITY Plan for Procedure(s): ETHMOIDECTOMY LEFT,MAXILLARY OSTIAL ENLARGEMENT WITH REMOVAL OF DISEASE LEFT TURBINATE REDUCTION   Melony Overly, MD 07/08/2013 3:58 PM

## 2013-07-09 ENCOUNTER — Encounter (HOSPITAL_BASED_OUTPATIENT_CLINIC_OR_DEPARTMENT_OTHER)
Admission: RE | Admit: 2013-07-09 | Discharge: 2013-07-09 | Disposition: A | Discharge status: Home / Self Care | Payer: 59 | Source: Ambulatory Visit | Attending: Otolaryngology | Admitting: Otolaryngology

## 2013-07-09 ENCOUNTER — Encounter (HOSPITAL_BASED_OUTPATIENT_CLINIC_OR_DEPARTMENT_OTHER): Payer: Self-pay | Admitting: *Deleted

## 2013-07-09 LAB — BASIC METABOLIC PANEL
ANION GAP: 13 (ref 5–15)
BUN: 19 mg/dL (ref 6–23)
CHLORIDE: 104 meq/L (ref 96–112)
CO2: 25 mEq/L (ref 19–32)
Calcium: 9.7 mg/dL (ref 8.4–10.5)
Creatinine, Ser: 0.66 mg/dL (ref 0.50–1.10)
GFR calc Af Amer: >90 mL/min (ref >90)
GFR calc non Af Amer: >90 mL/min (ref >90)
Glucose, Bld: 98 mg/dL (ref 70–99)
Potassium: 4.1 mEq/L (ref 3.7–5.3)
SODIUM: 142 meq/L (ref 137–147)

## 2013-07-09 NOTE — Progress Notes (Signed)
To come in for bmet-ekg-works [facility]- Denies sleep apnea-had echo to ck murmur 1/15-was normal

## 2013-07-10 ENCOUNTER — Encounter (HOSPITAL_BASED_OUTPATIENT_CLINIC_OR_DEPARTMENT_OTHER): Payer: Self-pay | Admitting: *Deleted

## 2013-07-10 ENCOUNTER — Ambulatory Visit (HOSPITAL_BASED_OUTPATIENT_CLINIC_OR_DEPARTMENT_OTHER): Payer: 59 | Admitting: Anesthesiology

## 2013-07-10 ENCOUNTER — Ambulatory Visit (HOSPITAL_BASED_OUTPATIENT_CLINIC_OR_DEPARTMENT_OTHER)
Admission: RE | Admit: 2013-07-10 | Discharge: 2013-07-10 | Disposition: A | Discharge status: Home / Self Care | Payer: 59 | Source: Ambulatory Visit | Attending: Otolaryngology | Admitting: Otolaryngology

## 2013-07-10 ENCOUNTER — Encounter (HOSPITAL_BASED_OUTPATIENT_CLINIC_OR_DEPARTMENT_OTHER)
Admission: RE | Disposition: A | Discharge status: Home / Self Care | Payer: Self-pay | Source: Ambulatory Visit | Attending: Otolaryngology

## 2013-07-10 ENCOUNTER — Encounter (HOSPITAL_BASED_OUTPATIENT_CLINIC_OR_DEPARTMENT_OTHER): Payer: 59 | Admitting: Anesthesiology

## 2013-07-10 DIAGNOSIS — J343 Hypertrophy of nasal turbinates: Secondary | ICD-10-CM | POA: Insufficient documentation

## 2013-07-10 DIAGNOSIS — Z87891 Personal history of nicotine dependence: Secondary | ICD-10-CM | POA: Insufficient documentation

## 2013-07-10 DIAGNOSIS — K219 Gastro-esophageal reflux disease without esophagitis: Secondary | ICD-10-CM | POA: Insufficient documentation

## 2013-07-10 DIAGNOSIS — Z8611 Personal history of tuberculosis: Secondary | ICD-10-CM | POA: Insufficient documentation

## 2013-07-10 DIAGNOSIS — J320 Chronic maxillary sinusitis: Secondary | ICD-10-CM | POA: Insufficient documentation

## 2013-07-10 DIAGNOSIS — J338 Other polyp of sinus: Secondary | ICD-10-CM | POA: Insufficient documentation

## 2013-07-10 DIAGNOSIS — R3915 Urgency of urination: Secondary | ICD-10-CM | POA: Insufficient documentation

## 2013-07-10 DIAGNOSIS — J322 Chronic ethmoidal sinusitis: Secondary | ICD-10-CM | POA: Insufficient documentation

## 2013-07-10 DIAGNOSIS — M129 Arthropathy, unspecified: Secondary | ICD-10-CM | POA: Insufficient documentation

## 2013-07-10 DIAGNOSIS — R51 Headache: Secondary | ICD-10-CM | POA: Insufficient documentation

## 2013-07-10 DIAGNOSIS — Z01812 Encounter for preprocedural laboratory examination: Secondary | ICD-10-CM | POA: Insufficient documentation

## 2013-07-10 DIAGNOSIS — I10 Essential (primary) hypertension: Secondary | ICD-10-CM | POA: Insufficient documentation

## 2013-07-10 DIAGNOSIS — J330 Polyp of nasal cavity: Secondary | ICD-10-CM | POA: Insufficient documentation

## 2013-07-10 DIAGNOSIS — R011 Cardiac murmur, unspecified: Secondary | ICD-10-CM | POA: Insufficient documentation

## 2013-07-10 HISTORY — PX: ETHMOIDECTOMY: SHX5197

## 2013-07-10 HISTORY — PX: TURBINATE REDUCTION: SHX6157

## 2013-07-10 LAB — POCT HEMOGLOBIN-HEMACUE: HEMOGLOBIN: 12.9 g/dL (ref 12.0–15.0)

## 2013-07-10 SURGERY — ETHMOIDECTOMY
Anesthesia: General | Site: Nose | Laterality: Left

## 2013-07-10 MED ORDER — HYDROMORPHONE HCL PF 1 MG/ML IJ SOLN
INTRAMUSCULAR | Status: AC
  Filled 2013-07-10: qty 1

## 2013-07-10 MED ORDER — LIDOCAINE-EPINEPHRINE 1 %-1:100000 IJ SOLN
INTRAMUSCULAR | Status: AC
  Filled 2013-07-10: qty 1

## 2013-07-10 MED ORDER — PHENYLEPHRINE HCL 10 MG/ML IJ SOLN
INTRAMUSCULAR | Status: DC | PRN
  Administered 2013-07-10: 40 ug via INTRAVENOUS

## 2013-07-10 MED ORDER — LIDOCAINE HCL (CARDIAC) 20 MG/ML IV SOLN
INTRAVENOUS | Status: DC | PRN
  Administered 2013-07-10: 100 mg via INTRAVENOUS

## 2013-07-10 MED ORDER — MIDAZOLAM HCL 2 MG/2ML IJ SOLN
INTRAMUSCULAR | Status: AC
  Filled 2013-07-10: qty 2

## 2013-07-10 MED ORDER — HYDROMORPHONE HCL PF 1 MG/ML IJ SOLN
0.2500 mg | INTRAMUSCULAR | Status: DC | PRN
  Administered 2013-07-10 (×3): 0.5 mg via INTRAVENOUS

## 2013-07-10 MED ORDER — SUCCINYLCHOLINE CHLORIDE 20 MG/ML IJ SOLN
INTRAMUSCULAR | Status: DC | PRN
  Administered 2013-07-10: 120 mg via INTRAVENOUS

## 2013-07-10 MED ORDER — ONDANSETRON HCL 4 MG/2ML IJ SOLN: 4.0000 mg | Freq: Once | INTRAMUSCULAR | Status: DC | PRN

## 2013-07-10 MED ORDER — FENTANYL CITRATE 0.05 MG/ML IJ SOLN: 50.0000 ug | INTRAMUSCULAR | Status: DC | PRN

## 2013-07-10 MED ORDER — PROPOFOL 10 MG/ML IV EMUL
INTRAVENOUS | Status: AC
  Filled 2013-07-10: qty 50

## 2013-07-10 MED ORDER — OXYCODONE HCL 5 MG PO TABS: 5.0000 mg | ORAL_TABLET | Freq: Once | ORAL | Status: DC | PRN

## 2013-07-10 MED ORDER — SODIUM CHLORIDE 0.9 % IJ SOLN
INTRAMUSCULAR | Status: AC
  Filled 2013-07-10: qty 10

## 2013-07-10 MED ORDER — METHYLPREDNISOLONE ACETATE 80 MG/ML IJ SUSP
INTRAMUSCULAR | Status: AC
  Filled 2013-07-10: qty 1

## 2013-07-10 MED ORDER — SUCCINYLCHOLINE CHLORIDE 20 MG/ML IJ SOLN
INTRAMUSCULAR | Status: AC
Start: 2013-07-10 — End: 2013-07-10
  Filled 2013-07-10: qty 1

## 2013-07-10 MED ORDER — OXYCODONE HCL 5 MG/5ML PO SOLN: 5.0000 mg | Freq: Once | ORAL | Status: DC | PRN

## 2013-07-10 MED ORDER — SUFENTANIL CITRATE 50 MCG/ML IV SOLN
INTRAVENOUS | Status: AC
  Filled 2013-07-10: qty 1

## 2013-07-10 MED ORDER — CEFAZOLIN SODIUM-DEXTROSE 2-3 GM-% IV SOLR
INTRAVENOUS | Status: AC
  Filled 2013-07-10: qty 50

## 2013-07-10 MED ORDER — DEXAMETHASONE SODIUM PHOSPHATE 4 MG/ML IJ SOLN
INTRAMUSCULAR | Status: DC | PRN
  Administered 2013-07-10: 10 mg via INTRAVENOUS

## 2013-07-10 MED ORDER — LIDOCAINE-EPINEPHRINE 1 %-1:100000 IJ SOLN
INTRAMUSCULAR | Status: DC | PRN
  Administered 2013-07-10: 8 mL

## 2013-07-10 MED ORDER — OXYMETAZOLINE HCL 0.05 % NA SOLN
NASAL | Status: AC
  Filled 2013-07-10: qty 15

## 2013-07-10 MED ORDER — HYDROCODONE-ACETAMINOPHEN 5-325 MG PO TABS: 1.0000 | ORAL_TABLET | ORAL | Status: DC | PRN

## 2013-07-10 MED ORDER — CEFAZOLIN SODIUM-DEXTROSE 2-3 GM-% IV SOLR
2.0000 g | INTRAVENOUS | Status: AC
  Administered 2013-07-10: 2 g via INTRAVENOUS

## 2013-07-10 MED ORDER — SODIUM CHLORIDE 0.9 % IR SOLN
Status: DC | PRN
  Administered 2013-07-10: 200 mL

## 2013-07-10 MED ORDER — MIDAZOLAM HCL 5 MG/5ML IJ SOLN
INTRAMUSCULAR | Status: DC | PRN
  Administered 2013-07-10: 2 mg via INTRAVENOUS

## 2013-07-10 MED ORDER — CEPHALEXIN 500 MG PO CAPS: 500.0000 mg | ORAL_CAPSULE | Freq: Two times a day (BID) | ORAL | Status: DC

## 2013-07-10 MED ORDER — MIDAZOLAM HCL 2 MG/2ML IJ SOLN
1.0000 mg | INTRAMUSCULAR | Status: DC | PRN
Start: 2013-07-10 — End: 2013-07-10

## 2013-07-10 MED ORDER — ONDANSETRON HCL 4 MG/2ML IJ SOLN
INTRAMUSCULAR | Status: DC | PRN
  Administered 2013-07-10: 4 mg via INTRAVENOUS

## 2013-07-10 MED ORDER — LACTATED RINGERS IV SOLN
INTRAVENOUS | Status: DC
  Administered 2013-07-10 (×3): via INTRAVENOUS

## 2013-07-10 MED ORDER — BACITRACIN ZINC 500 UNIT/GM EX OINT
TOPICAL_OINTMENT | CUTANEOUS | Status: AC
  Filled 2013-07-10: qty 28.35

## 2013-07-10 MED ORDER — OXYMETAZOLINE HCL 0.05 % NA SOLN
NASAL | Status: DC | PRN
  Administered 2013-07-10: 1 via NASAL

## 2013-07-10 MED ORDER — SUFENTANIL CITRATE 50 MCG/ML IV SOLN
INTRAVENOUS | Status: DC | PRN
  Administered 2013-07-10: 10 ug via INTRAVENOUS
  Administered 2013-07-10 (×2): 5 ug via INTRAVENOUS

## 2013-07-10 MED ORDER — PROPOFOL 10 MG/ML IV BOLUS
INTRAVENOUS | Status: DC | PRN
  Administered 2013-07-10: 200 mg via INTRAVENOUS

## 2013-07-10 SURGICAL SUPPLY — 59 items
APPLICATOR COTTON TIP 6IN STRL (MISCELLANEOUS) IMPLANT
ATTRACTOMAT 16X20 MAGNETIC DRP (DRAPES) IMPLANT
BLADE INF TURB ROT M4 2 5PK (BLADE) IMPLANT
BLADE INF TURB ROT M4 2MM 5PK (BLADE)
BLADE RAD40 ROTATE 4M 4 5PK (BLADE) IMPLANT
BLADE RAD40 ROTATE 4M 4MM 5PK (BLADE)
BLADE RAD60 ROTATE M4 4 5PK (BLADE) ×2 IMPLANT
BLADE RAD60 ROTATE M4 4MM 5PK (BLADE) ×1
BLADE ROTATE RAD12 5PK M4 4MM (BLADE) IMPLANT
BLADE SURG 15 STRL LF DISP TIS (BLADE) IMPLANT
BLADE SURG 15 STRL SS (BLADE)
BLADE TRICUT ROTATE M4 4 5PK (BLADE) ×2 IMPLANT
BLADE TRICUT ROTATE M4 4MM 5PK (BLADE) ×1
BUR HS RAD FRONTAL 3 (BURR) IMPLANT
BUR HS RAD FRONTAL 3MM (BURR)
CANISTER SUC SOCK COL 7IN (MISCELLANEOUS) ×3 IMPLANT
CANISTER SUCT 1200ML W/VALVE (MISCELLANEOUS) ×3 IMPLANT
COAGULATOR SUCT 6 FR SWTCH (ELECTROSURGICAL) ×1
COAGULATOR SUCT 8FR VV (MISCELLANEOUS) ×3 IMPLANT
COAGULATOR SUCT SWTCH 10FR 6 (ELECTROSURGICAL) ×2 IMPLANT
COVER MAYO STAND STRL (DRAPES) ×3 IMPLANT
DECANTER SPIKE VIAL GLASS SM (MISCELLANEOUS) IMPLANT
DRESSING NASAL KENNEDY 3.5X.9 (MISCELLANEOUS) IMPLANT
DRSG NASAL KENNEDY 3.5X.9 (MISCELLANEOUS)
DRSG NASAL KENNEDY LMNT 8CM (GAUZE/BANDAGES/DRESSINGS) IMPLANT
DRSG NASOPORE 8CM (GAUZE/BANDAGES/DRESSINGS) IMPLANT
DRSG TELFA 3X8 NADH (GAUZE/BANDAGES/DRESSINGS) IMPLANT
ELECT REM PT RETURN 9FT ADLT (ELECTROSURGICAL) ×3
ELECTRODE REM PT RTRN 9FT ADLT (ELECTROSURGICAL) ×1 IMPLANT
GLOVE SS BIOGEL STRL SZ 7.5 (GLOVE) ×1 IMPLANT
GLOVE SUPERSENSE BIOGEL SZ 7.5 (GLOVE) ×2
GLOVE SURG SS PI 7.0 STRL IVOR (GLOVE) ×3 IMPLANT
GOWN STRL REUS W/ TWL LRG LVL3 (GOWN DISPOSABLE) ×1 IMPLANT
GOWN STRL REUS W/TWL LRG LVL3 (GOWN DISPOSABLE) ×2
HEMOSTAT SURGICEL .5X2 ABSORB (HEMOSTASIS) IMPLANT
HEMOSTAT SURGICEL 2X14 (HEMOSTASIS) IMPLANT
IV NS 500ML (IV SOLUTION)
IV NS 500ML BAXH (IV SOLUTION) IMPLANT
NEEDLE 27GAX1X1/2 (NEEDLE) ×3 IMPLANT
NEEDLE SPNL 25GX3.5 QUINCKE BL (NEEDLE) IMPLANT
NS IRRIG 1000ML POUR BTL (IV SOLUTION) IMPLANT
PACK BASIN DAY SURGERY FS (CUSTOM PROCEDURE TRAY) ×3 IMPLANT
PACK ENT DAY SURGERY (CUSTOM PROCEDURE TRAY) ×3 IMPLANT
PATTIES SURGICAL .5 X3 (DISPOSABLE) ×3 IMPLANT
SHEET MEDIUM DRAPE 40X70 STRL (DRAPES) ×3 IMPLANT
SLEEVE SCD COMPRESS KNEE MED (MISCELLANEOUS) ×3 IMPLANT
SOLUTION ANTI FOG 6CC (MISCELLANEOUS) ×3 IMPLANT
SPONGE GAUZE 2X2 8PLY STER LF (GAUZE/BANDAGES/DRESSINGS) ×1
SPONGE GAUZE 2X2 8PLY STRL LF (GAUZE/BANDAGES/DRESSINGS) ×2 IMPLANT
SUT CHROMIC 4 0 PS 2 18 (SUTURE) IMPLANT
SUT ETHILON 3 0 PS 1 (SUTURE) IMPLANT
SUT SILK 2 0 FS (SUTURE) IMPLANT
SYRINGE 3CC/18X1.5 ECLIPSE (MISCELLANEOUS) IMPLANT
SYRINGE CONTROL L 12CC (SYRINGE) ×3 IMPLANT
TOWEL OR 17X24 6PK STRL BLUE (TOWEL DISPOSABLE) ×3 IMPLANT
TRAY DSU PREP LF (CUSTOM PROCEDURE TRAY) ×3 IMPLANT
TUBE CONNECTING 20'X1/4 (TUBING) ×1
TUBE CONNECTING 20X1/4 (TUBING) ×2 IMPLANT
YANKAUER SUCT BULB TIP NO VENT (SUCTIONS) ×3 IMPLANT

## 2013-07-10 NOTE — Discharge Instructions (Addendum)
Apply cold compress to your nose if you have much bleeding OK to blow nose gently Call office for follow up appt in 5-6 days    [phone] Take your regular meds and Keflex 500 mg twice per day for 1 week Tylenol, motrin or Hydrocodone prn pain  Call your surgeon if you experience:   1.  Fever over 101.0. 2.  Inability to urinate. 3.  Nausea and/or vomiting. 4.  Extreme swelling or bruising at the surgical site. 5.  Continued bleeding from the incision. 6.  Increased pain, redness or drainage from the incision. 7.  Problems related to your pain medication.  Post Anesthesia Home Care Instructions  Activity: Get plenty of rest for the remainder of the day. A responsible adult should stay with you for 24 hours following the procedure.  For the next 24 hours, DO NOT: -Drive a car -Paediatric nurse -Drink alcoholic beverages -Take any medication unless instructed by your physician -Make any legal decisions or sign important papers.  Meals: Start with liquid foods such as gelatin or soup. Progress to regular foods as tolerated. Avoid greasy, spicy, heavy foods. If nausea and/or vomiting occur, drink only clear liquids until the nausea and/or vomiting subsides. Call your physician if vomiting continues.  Special Instructions/Symptoms: Your throat may feel dry or sore from the anesthesia or the breathing tube placed in your throat during surgery. If this causes discomfort, gargle with warm salt water. The discomfort should disappear within 24 hours.

## 2013-07-10 NOTE — Anesthesia Procedure Notes (Signed)
Procedure Name: Intubation Date/Time: 07/10/2013 8:03 AM Performed by: Melynda Ripple D Pre-anesthesia Checklist: Patient identified, Emergency Drugs available, Suction available and Patient being monitored Patient Re-evaluated:Patient Re-evaluated prior to inductionOxygen Delivery Method: Circle System Utilized Preoxygenation: Pre-oxygenation with 100% oxygen Intubation Type: IV induction Ventilation: Mask ventilation without difficulty Tube type: Oral Tube size: 7.0 mm Number of attempts: 1 Airway Equipment and Method: stylet and Video-laryngoscopy (See quick note) Placement Confirmation: ETT inserted through vocal cords under direct vision,  positive ETCO2 and breath sounds checked- equal and bilateral Secured at: 23 cm Tube secured with: Tape Dental Injury: Teeth and Oropharynx as per pre-operative assessment

## 2013-07-10 NOTE — Anesthesia Preprocedure Evaluation (Signed)
Anesthesia Evaluation  Patient identified by MRN, date of birth, ID band Patient awake    Reviewed: Allergy & Precautions, H&P , NPO status , Patient's Chart, lab work & pertinent test results  Airway Mallampati: I TM Distance: >3 FB Neck ROM: Full    Dental  (+) Teeth Intact, Dental Advisory Given   Pulmonary former smoker,  breath sounds clear to auscultation        Cardiovascular hypertension, Pt. on medications Rhythm:Regular Rate:Normal     Neuro/Psych  Headaches,    GI/Hepatic   Endo/Other    Renal/GU      Musculoskeletal   Abdominal   Peds  Hematology   Anesthesia Other Findings   Reproductive/Obstetrics                           Anesthesia Physical Anesthesia Plan  ASA: II  Anesthesia Plan: General   Post-op Pain Management:    Induction: Intravenous  Airway Management Planned: Oral ETT  Additional Equipment:   Intra-op Plan:   Post-operative Plan: Extubation in OR  Informed Consent: I have reviewed the patients History and Physical, chart, labs and discussed the procedure including the risks, benefits and alternatives for the proposed anesthesia with the patient or authorized representative who has indicated his/her understanding and acceptance.   Dental advisory given  Plan Discussed with: CRNA, Anesthesiologist and Surgeon  Anesthesia Plan Comments:         Anesthesia Quick Evaluation

## 2013-07-10 NOTE — Interval H&P Note (Signed)
History and Physical Interval Note:  07/10/2013 7:32 AM  Brianna Hawkins  has presented today for surgery, with the diagnosis of CHRONIC ETHMOIDAL SINUSITIS, POLY LEFT NASAL CAVITY  The various methods of treatment have been discussed with the patient and family. After consideration of risks, benefits and other options for treatment, the patient has consented to  Procedure(s): ETHMOIDECTOMY LEFT,MAXILLARY OSTIAL ENLARGEMENT WITH REMOVAL OF DISEASE (Left) LEFT TURBINATE REDUCTION (Left) as a surgical intervention .  The patient's history has been reviewed, patient examined, no change in status, stable for surgery.  I have reviewed the patient's chart and labs.  Questions were answered to the patient's satisfaction.     Kollen Armenti

## 2013-07-10 NOTE — Op Note (Deleted)
NAMEMarland Kitchen  KEELY, DRENNAN NO.:  [id number]  MEDICAL RECORD NO.:  [id number]  LOCATION:                               FACILITY:  [facility]  PHYSICIAN:  Leonides Sake. Lucia Gaskins, M.D.DATE OF BIRTH:  [birth year]  DATE OF PROCEDURE:  07/10/2013 DATE OF DISCHARGE:  07/10/2013                              OPERATIVE REPORT   PREOPERATIVE DIAGNOSIS:  Chronic left maxillary and ethmoid sinus disease with left nasal polyps and turbinate hypertrophy.  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  OPERATION PERFORMED:  Functional endoscopic sinus surgery with left ethmoidectomy, left maxillary ostial enlargement with partial resection of left inferior turbinate and removal of polypoid disease from the left maxillary sinus.  Right inferior turbinate reduction.  SURGEON:  Leonides Sake. Lucia Gaskins, M.D.  ANESTHESIA:  General endotracheal.  COMPLICATIONS:  None.  BRIEF CLINICAL NOTE:  Brianna Hawkins is a 53 year old female who has been having some chronic left-sided nasal sinus symptoms for the last 3 months.  She has been having increasing left-sided nasal obstruction. She has been on several rounds of antibiotics.  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  On exam in the office, the patient had some polypoid tissue extruding from the left maxillary ostia into the left nasal cavity, extending posteriorly.  The right sinus cavities were clear. Right nasal cavity was clear.  She was taken to the operating room at this time for left functional endoscopic sinus surgery with a partial left ethmoidectomy and left maxillary ostial enlargement and turbinate reductions to help improve her nasal  airway.  DESCRIPTION OF PROCEDURE:  After adequate endotracheal anesthesia, nose was prepped with Betadine solution and draped out with sterile towels. The patient received 2 g Ancef IV preoperatively.  Middle meatus, inferior and middle turbinates have been injected with Xylocaine with epinephrine for hemostasis.  On exam with the endoscope, the patient had some polypoid tissue extending from the maxillary ostia, extending down to the posterior nasal cavity.  The medial wall of the left maxillary sinus that bowed into the airway, also bowing the left middle as well as the inferior turbinate up against the septum.  A sickle knife was used to incise the uncinate process.  The maxillary ostia was enlarged with straight Thru-Cut forceps.  There is large amount of polypoid tissue within the maxillary sinus, extended out the ostia down into the nasal cavity.  Large amount of polypoid tissue was removed and sent to Pathology.  Using the microdebrider, the polypoid tissue was removed and the anterior ethmoid area was opened up.  Maxillary ostia was enlarged down into the inferior turbinate and included amputation of the upper portion of the inferior turbinate.  Then, using the 60-degree microdebrider, this was positioned to the maxillary sinus and the polypoid tissue was removed.  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  All of the polypoid tissue was removed from the maxillary sinus.  The remaining maxillary sinus wall was clear. A 60-degree scope was then used to evaluate the maxillary sinus and the maxillary sinus walls were clear except for the one area where the polypoid tissue seem to arise and this area was cauterized and burned down to the bone to include all of the polypoid tissue in the mucosa of the maxillary sinus around  this area.  This completed the procedure. Portion of the medial maxillary wall was removed.  The ostia was enlarged to about 2 to 3 cm in size.  The right nasal cavity was examined and this was clear of any disease.  There was no other polypoid tissue identified.  Using the Elmed bipolar cautery, submucosal cauterization of the remaining inferior turbinate on the left side as well as a right side was cauterized and reduced and the turbinates were outfractured.  Of note, the patient had a slight septal deviation to the left, but nothing was done with the septal deviation.  The nose and nasal cavity was irrigated.  There was minimal bleeding.  No packing was required.  Shamiah was awoken from anesthesia and transferred to the recovery room, postop doing well.  DISPOSITION:  The patient was discharged home later this morning on Keflex 5 mg b.i.d. for [redacted] week along with Tylenol and Vicodin p.r.n. pain.  However, follow up in my office in 5 to 6 days for recheck and review final pathology.          ______________________________ Leonides Sake Lucia Gaskins, M.D.     CEN/MEDQ  D:  07/10/2013  T:  07/10/2013  Job:  [id number]

## 2013-07-10 NOTE — Transfer of Care (Signed)
Immediate Anesthesia Transfer of Care Note  Patient: Brianna Hawkins  Procedure(s) Performed: Procedure(s): ETHMOIDECTOMY LEFT,MAXILLARY OSTIAL ENLARGEMENT WITH REMOVAL OF DISEASE (Left) LEFT TURBINATE REDUCTION (Left)  Patient Location: PACU  Anesthesia Type:General  Level of Consciousness: awake, alert  and oriented  Airway & Oxygen Therapy: Patient Spontanous Breathing and Patient connected to face mask oxygen  Post-op Assessment: Report given to PACU RN and Post -op Vital signs reviewed and stable  Post vital signs: Reviewed and stable  Complications: No apparent anesthesia complications

## 2013-07-10 NOTE — Anesthesia Postprocedure Evaluation (Signed)
  Anesthesia Post-op Note  Patient: Brianna Hawkins  Procedure(s) Performed: Procedure(s): ETHMOIDECTOMY LEFT,MAXILLARY OSTIAL ENLARGEMENT WITH REMOVAL OF DISEASE (Left) LEFT TURBINATE REDUCTION (Left)  Patient Location: PACU  Anesthesia Type: General   Level of Consciousness: awake, alert  and oriented  Airway and Oxygen Therapy: Patient Spontanous Breathing  Post-op Pain: mild  Post-op Assessment: Post-op Vital signs reviewed  Post-op Vital Signs: Reviewed  Last Vitals:  Filed Vitals:   07/10/13 1030  BP: 137/83  Pulse: 80  Temp:   Resp: 18    Complications: No apparent anesthesia complications

## 2013-07-10 NOTE — Brief Op Note (Signed)
07/10/2013  9:52 AM  PATIENT:  Brianna Hawkins  53 y.o. female  PRE-OPERATIVE DIAGNOSIS:  CHRONIC ETHMOIDAL and MAXILLARY SINUSITIS, POLYP LEFT NASAL CAVITY  POST-OPERATIVE DIAGNOSIS:  CHRONIC ETHMOIDAL SINUSITIS, POLYP LEFT NASAL CAVITY  PROCEDURE:  Procedure(s): ETHMOIDECTOMY LEFT,MAXILLARY OSTIAL ENLARGEMENT WITH REMOVAL OF DISEASE (Left) LEFT TURBINATE REDUCTION (Left) and RIGHT turbinate reduction  SURGEON:  Surgeon(s) and Role:    * Rozetta Nunnery, MD - Primary  PHYSICIAN ASSISTANT:   ASSISTANTS: none   ANESTHESIA:   general  EBL:  Total I/O In: 1000 [I.V.:1000] Out: -   BLOOD ADMINISTERED:none  DRAINS: none   LOCAL MEDICATIONS USED:  XYLOCAINE with EPI 8 cc  SPECIMEN:  Source of Specimen:  left maxillary sinus poyps  DISPOSITION OF SPECIMEN:  PATHOLOGY  COUNTS:  YES  TOURNIQUET:  * No tourniquets in log *  DICTATION: .Other Dictation: Dictation Number [id number]  PLAN OF CARE: Discharge to home after PACU  PATIENT DISPOSITION:  PACU - hemodynamically stable.   Delay start of Pharmacological VTE agent (>24hrs) due to surgical blood loss or risk of bleeding: yes

## 2013-07-10 NOTE — Op Note (Signed)
NAMEMarland Kitchen  MICAELA, STITH NO.:  [id number]  MEDICAL RECORD NO.:  [id number]  LOCATION:                               FACILITY:  [facility]  PHYSICIAN:  Leonides Sake. 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  OPERATION PERFORMED:  Functional endoscopic sinus surgery with left ethmoidectomy, left maxillary ostial enlargement with partial resection of left inferior turbinate and removal of polypoid disease from the left maxillary sinus.  Right inferior turbinate reduction.  SURGEON:  Leonides Sake. Lucia Gaskins, M.D.  ANESTHESIA:  General endotracheal.  COMPLICATIONS:  None.  BRIEF CLINICAL NOTE:  Brianna Hawkins is a 53 year old female who has been having some chronic left-sided nasal sinus symptoms for the last 3 months.  She has been having increasing left-sided nasal obstruction. She has been on several rounds of antibiotics.  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  On exam in the office, the patient had some polypoid tissue extruding from the left maxillary ostia into the left nasal cavity, extending posteriorly.  The right sinus cavities were clear. Right nasal cavity was clear.  She was taken to the operating room at this time for left functional endoscopic sinus surgery with a partial left ethmoidectomy and left maxillary ostial enlargement and turbinate reductions to help improve her nasal  airway.  DESCRIPTION OF PROCEDURE:  After adequate endotracheal anesthesia, nose was prepped with Betadine solution and draped out with sterile towels. The patient received 2 g Ancef IV preoperatively.  Middle meatus, inferior and middle turbinates have been injected with Xylocaine with epinephrine for hemostasis.  On exam with the endoscope, the patient had some polypoid tissue extending from the maxillary ostia, extending down to the posterior nasal cavity.  The medial wall of the left maxillary sinus that bowed into the airway, also bowing the left middle as well as the inferior turbinate up against the septum.  A sickle knife was used to incise the uncinate process.  The maxillary ostia was enlarged with straight Thru-Cut forceps.  There is large amount of polypoid tissue within the maxillary sinus, extended out the ostia down into the nasal cavity.  Large amount of polypoid tissue was removed and sent to Pathology.  Using the microdebrider, the polypoid tissue was removed and the anterior ethmoid area was opened up.  Maxillary ostia was enlarged down into the inferior turbinate and included amputation of the upper portion of the inferior turbinate.  Then, using the 60-degree microdebrider, this was positioned to the maxillary sinus and the polypoid tissue was removed.  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  All of the polypoid tissue was removed from the maxillary sinus.  The remaining maxillary sinus wall was clear. A 60-degree scope was then used to evaluate the maxillary sinus and the maxillary sinus walls were clear except for the one area where the polypoid tissue seem to arise and this area was cauterized and burned down to the bone to include all of the polypoid tissue in the mucosa of the maxillary sinus around  this area.  This completed the procedure. Portion of the medial maxillary wall was removed.  The ostia was enlarged to about 2 to 3 cm in size.  The right nasal cavity was examined and this was clear of any disease.  There was no other polypoid tissue identified.  Using the Elmed bipolar cautery, submucosal cauterization of the remaining inferior turbinate on the left side as well as a right side was cauterized and reduced and the turbinates were outfractured.  Of note, the patient had a slight septal deviation to the left, but nothing was done with the septal deviation.  The nose and nasal cavity was irrigated.  There was minimal bleeding.  No packing was required.  Efrata was awoken from anesthesia and transferred to the recovery room, postop doing well.  DISPOSITION:  The patient was discharged home later this morning on Keflex 5 mg b.i.d. for [redacted] week along with Tylenol and Vicodin p.r.n. pain.  However, follow up in my office in 5 to 6 days for recheck and review final pathology.          ______________________________ Leonides Sake Lucia Gaskins, M.D.     CEN/MEDQ  D:  07/10/2013  T:  07/10/2013  Job:  [id number]

## 2013-07-13 ENCOUNTER — Encounter (HOSPITAL_BASED_OUTPATIENT_CLINIC_OR_DEPARTMENT_OTHER): Payer: Self-pay | Admitting: Otolaryngology

## 2013-07-16 NOTE — Addendum Note (Signed)
Addendum created 07/16/13 1904 by Napoleon Form, MD   Modules edited: Anesthesia Attestations

## 2013-07-20 ENCOUNTER — Ambulatory Visit (INDEPENDENT_AMBULATORY_CARE_PROVIDER_SITE_OTHER): Payer: 59 | Admitting: Emergency Medicine

## 2013-07-20 VITALS — BP 110/78 | HR 76 | Temp 98.4°F | Resp 18 | Ht 66.0 in | Wt 220.8 lb

## 2013-07-20 DIAGNOSIS — M545 Low back pain, unspecified: Secondary | ICD-10-CM

## 2013-07-20 LAB — POCT UA - MICROSCOPIC ONLY
CRYSTALS, UR, HPF, POC: NEGATIVE
Casts, Ur, LPF, POC: NEGATIVE
Yeast, UA: NEGATIVE

## 2013-07-20 LAB — POCT URINALYSIS DIPSTICK
Bilirubin, UA: NEGATIVE
GLUCOSE UA: NEGATIVE
Ketones, UA: NEGATIVE
Leukocytes, UA: NEGATIVE
NITRITE UA: NEGATIVE
PROTEIN UA: NEGATIVE
SPEC GRAV UA: 1.02
UROBILINOGEN UA: 1
pH, UA: 5

## 2013-07-20 MED ORDER — CYCLOBENZAPRINE HCL 10 MG PO TABS: 10.0000 mg | ORAL_TABLET | Freq: Three times a day (TID) | ORAL | Status: DC | PRN

## 2013-07-20 MED ORDER — NAPROXEN SODIUM 550 MG PO TABS: 550.0000 mg | ORAL_TABLET | Freq: Two times a day (BID) | ORAL | Status: DC

## 2013-07-20 NOTE — Progress Notes (Signed)
[facility] [address] [place] [address] [phone]  Date:  07/20/2013   Name:  Brianna Hawkins   DOB:  [birth year]   MRN:  [id number]  PCP:  [name], MD    Chief Complaint: Flank Pain   History of Present Illness:  Brianna Hawkins is a 53 y.o. very pleasant female patient who presents with the following:  Had sinus surgery 9 days ago.  Since discharge, has been having pain in the right CVA worse breathing and moving.  Non radiating. No neuro symptoms.  No cough or coryza.  No fever or chills.  No nausea or chills.  No dysuria urgency but has frequency.  No catheterization.  No blood in urine. No stool change.  Works in Nurse, adult at the hospital.  Discussed the pain with her surgeon who suggested OTC meds for pain.  No improvement with over the counter medications or other home remedies. Denies other complaint or health concern today.   Patient Active Problem List   Diagnosis Date Noted  . Chronic sinusitis 07/06/2013  . Palpitations 02/18/2012  . BMI 34.0-34.9,adult 08/02/2011  . Chest pain 04/21/2011  . Abnormal CXR 04/21/2011  . Dyspnea 04/21/2011  . Hypertension 02/08/2011  . Vertigo, benign positional 02/08/2011  . Insomnia 02/08/2011  . Headache 02/07/2011    Past Medical History  Diagnosis Date  . Urinary urgency   . Pain, joint, shoulder region, right   . Headache(784.0)   . Hypertension   . Allergy   . Blood transfusion   . GERD (gastroesophageal reflux disease)   . Tuberculosis     7 YEARS AGO  . Blood transfusion without reported diagnosis   . Arthritis   . Substance abuse   . Heart murmur     very mild-echo 1/15-normal    Past Surgical History  Procedure Laterality Date  . Tubal ligation    . Breast lumpectomy  2012    Underarm-rt-extra tissue  . Colonoscopy    . Upper gi endoscopy    . Ethmoidectomy Left 07/10/2013    Procedure: ETHMOIDECTOMY LEFT,MAXILLARY OSTIAL ENLARGEMENT WITH REMOVAL OF DISEASE;   Surgeon: Rozetta Nunnery, MD;  Location: [facility];  Service: ENT;  Laterality: Left;  . Turbinate reduction Left 07/10/2013    Procedure: LEFT TURBINATE REDUCTION;  Surgeon: Rozetta Nunnery, MD;  Location: [facility];  Service: ENT;  Laterality: Left;  . Nose surgery      History  Substance Use Topics  . Smoking status: Former Smoker -- 0.30 packs/day for 15 years    Types: Cigarettes    Quit date: 01/01/2001  . Smokeless tobacco: Never Used     Comment: "never inhaled"  . Alcohol Use: No    Family History  Problem Relation Age of Onset  . Throat cancer Father     was a smoker  . Colon cancer Father   . Dementia Other   . HIV Sister   . Stroke Mother   . Diabetes Paternal Grandmother   . Diabetes Maternal Grandmother     No Known Allergies  Medication list has been reviewed and updated.  Current Outpatient Prescriptions on File Prior to Visit  Medication Sig Dispense Refill  . acetaminophen-codeine (TYLENOL #3) 300-30 MG per tablet Take 1-2 tablets by mouth every 4 (four) hours as needed.  30 tablet  0  . amLODipine (NORVASC) 10 MG tablet Take 1 tablet (10 mg total) by mouth daily.  90 tablet  3  . buPROPion (WELLBUTRIN) 75 MG tablet Take 1 tablet (75 mg total) by mouth daily with breakfast.  90 tablet  3  . cephALEXin (KEFLEX) 500 MG capsule Take 1 capsule (500 mg total) by mouth 2 (two) times daily.  14 capsule  0  . esomeprazole (NEXIUM) 40 MG capsule Take 1 capsule (40 mg total) by mouth daily before breakfast.  30 capsule  5  . fluticasone (FLONASE) 50 MCG/ACT nasal spray Place 2 sprays into both nostrils daily.  16 g  1  . hydrochlorothiazide (MICROZIDE) 12.5 MG capsule Take 1 capsule (12.5 mg total) by mouth daily.  90 capsule  3  . HYDROcodone-acetaminophen (NORCO) 5-325 MG per tablet Take 1 tablet by mouth every 6 (six) hours as needed for moderate pain.  30 tablet  0  . HYDROcodone-acetaminophen (NORCO/VICODIN) 5-325 MG per  tablet Take 1 tablet by mouth every 4 (four) hours as needed for moderate pain.  24 tablet  0  . ibuprofen (ADVIL,MOTRIN) 800 MG tablet Take 1 tablet (800 mg total) by mouth every 8 (eight) hours as needed.  60 tablet  0  . ipratropium (ATROVENT) 0.03 % nasal spray Place 2 sprays into the nose 2 (two) times daily.      Marland Kitchen LORazepam (ATIVAN) 0.5 MG tablet Take 1 tablet (0.5 mg total) by mouth at bedtime as needed for anxiety.  30 tablet  1  . meclizine (ANTIVERT) 25 MG tablet Take 1 tablet (25 mg total) by mouth 3 (three) times daily as needed.  90 tablet  3  . methocarbamol (ROBAXIN) 500 MG tablet Take 1 tablet (500 mg total) by mouth 2 (two) times daily as needed.  30 tablet  5  . Multiple Vitamins-Minerals (HAIR/SKIN/NAILS) TABS Take 1 tablet by mouth daily.      . naproxen sodium (ANAPROX DS) 550 MG tablet Take 1 tablet (550 mg total) by mouth 2 (two) times daily with a meal.  40 tablet  0  . SUMAtriptan (IMITREX) 100 MG tablet Take 1 tablet (100 mg total) by mouth every 2 (two) hours as needed for migraine.  10 tablet  0  . traMADol (ULTRAM) 50 MG tablet Take 1 tablet (50 mg total) by mouth every 6 (six) hours as needed for pain.  50 tablet  5   No current facility-administered medications on file prior to visit.    Review of Systems:  As per HPI, otherwise negative.    Physical Examination: Filed Vitals:   07/20/13 1715  BP: 110/78  Pulse: 76  Temp: 98.4 F (36.9 C)  Resp: 18   Filed Vitals:   07/20/13 1715  Height: 5\' 6"  (1.676 m)  Weight: 220 lb 12.8 oz (100.154 kg)   Body mass index is 35.65 kg/(m^2). Ideal Body Weight: Weight in (lb) to have BMI = 25: 154.6  GEN: WDWN, NAD, Non-toxic, A & O x 3 HEENT: Atraumatic, Normocephalic. Neck supple. No masses, No LAD. Ears and Nose: No external deformity. CV: RRR, No M/G/R. No JVD. No thrill. No extra heart sounds. PULM: CTA B, no wheezes, crackles, rhonchi. No retractions. No resp. distress. No accessory muscle use. ABD: S,  NT, ND, +BS. No rebound. No HSM. EXTR: No c/c/e NEURO Normal gait.  PSYCH: Normally interactive. Conversant. Not depressed or anxious appearing.  Calm demeanor.  BACK: tender right lumbar paraspinous muscles.  Neuro intact  Assessment and Plan: Lumbar strain Anaprox Flexeril tyl #3  Signed,  Ellison Carwin, MD   Results for orders placed in  visit on 07/20/13  POCT URINALYSIS DIPSTICK      Result Value Ref Range   Color, UA yellow     Clarity, UA clear     Glucose, UA neg     Bilirubin, UA neg     Ketones, UA neg     Spec Grav, UA 1.020     Blood, UA trace     pH, UA 5.0     Protein, UA neg     Urobilinogen, UA 1.0     Nitrite, UA neg     Leukocytes, UA Negative    POCT UA - MICROSCOPIC ONLY      Result Value Ref Range   WBC, Ur, HPF, POC 1-3     RBC, urine, microscopic 0-1     Bacteria, U Microscopic trace     Mucus, UA trace     Epithelial cells, urine per micros 1-3     Crystals, Ur, HPF, POC neg     Casts, Ur, LPF, POC neg     Yeast, UA neg

## 2013-07-20 NOTE — Patient Instructions (Signed)

## 2013-08-30 ENCOUNTER — Encounter (HOSPITAL_COMMUNITY): Payer: Self-pay | Admitting: Emergency Medicine

## 2013-08-30 ENCOUNTER — Emergency Department (HOSPITAL_COMMUNITY)
Admission: EM | Admit: 2013-08-30 | Discharge: 2013-08-30 | Disposition: A | Discharge status: Home / Self Care | Payer: 59 | Attending: Emergency Medicine | Admitting: Emergency Medicine

## 2013-08-30 DIAGNOSIS — IMO0002 Reserved for concepts with insufficient information to code with codable children: Secondary | ICD-10-CM | POA: Diagnosis not present

## 2013-08-30 DIAGNOSIS — X500XXA Overexertion from strenuous movement or load, initial encounter: Secondary | ICD-10-CM | POA: Diagnosis not present

## 2013-08-30 DIAGNOSIS — S8990XA Unspecified injury of unspecified lower leg, initial encounter: Secondary | ICD-10-CM | POA: Diagnosis present

## 2013-08-30 DIAGNOSIS — S86812A Strain of other muscle(s) and tendon(s) at lower leg level, left leg, initial encounter: Secondary | ICD-10-CM

## 2013-08-30 DIAGNOSIS — Z8611 Personal history of tuberculosis: Secondary | ICD-10-CM | POA: Insufficient documentation

## 2013-08-30 DIAGNOSIS — Z79899 Other long term (current) drug therapy: Secondary | ICD-10-CM | POA: Diagnosis not present

## 2013-08-30 DIAGNOSIS — Y990 Civilian activity done for income or pay: Secondary | ICD-10-CM | POA: Insufficient documentation

## 2013-08-30 DIAGNOSIS — I10 Essential (primary) hypertension: Secondary | ICD-10-CM | POA: Insufficient documentation

## 2013-08-30 DIAGNOSIS — R011 Cardiac murmur, unspecified: Secondary | ICD-10-CM | POA: Diagnosis not present

## 2013-08-30 DIAGNOSIS — Z87891 Personal history of nicotine dependence: Secondary | ICD-10-CM | POA: Insufficient documentation

## 2013-08-30 DIAGNOSIS — Y9389 Activity, other specified: Secondary | ICD-10-CM | POA: Insufficient documentation

## 2013-08-30 DIAGNOSIS — Y9289 Other specified places as the place of occurrence of the external cause: Secondary | ICD-10-CM | POA: Insufficient documentation

## 2013-08-30 DIAGNOSIS — S99919A Unspecified injury of unspecified ankle, initial encounter: Secondary | ICD-10-CM | POA: Diagnosis present

## 2013-08-30 DIAGNOSIS — K219 Gastro-esophageal reflux disease without esophagitis: Secondary | ICD-10-CM | POA: Diagnosis not present

## 2013-08-30 DIAGNOSIS — Z792 Long term (current) use of antibiotics: Secondary | ICD-10-CM | POA: Insufficient documentation

## 2013-08-30 DIAGNOSIS — M129 Arthropathy, unspecified: Secondary | ICD-10-CM | POA: Insufficient documentation

## 2013-08-30 DIAGNOSIS — S99929A Unspecified injury of unspecified foot, initial encounter: Secondary | ICD-10-CM

## 2013-08-30 MED ORDER — NAPROXEN 500 MG PO TABS: 500.0000 mg | ORAL_TABLET | Freq: Two times a day (BID) | ORAL | Status: DC

## 2013-08-30 NOTE — ED Notes (Signed)
Per pt sts that she was at work Tuesday and was moving a chair and felt a ripping sensation in her left calf. sts again today when she was bending over her bed. sts sharp pain and felt a ripping sensation.

## 2013-08-30 NOTE — Discharge Instructions (Signed)
Rest, ice and elevate your leg. Take naproxen as directed. Followup with your primary care physician.  Muscle Strain A muscle strain is an injury that occurs when a muscle is stretched beyond its normal length. Usually a small number of muscle fibers are torn when this happens. Muscle strain is rated in degrees. First-degree strains have the least amount of muscle fiber tearing and pain. Second-degree and third-degree strains have increasingly more tearing and pain.  Usually, recovery from muscle strain takes 1-2 weeks. Complete healing takes 5-6 weeks.  CAUSES  Muscle strain happens when a sudden, violent force placed on a muscle stretches it too far. This may occur with lifting, sports, or a fall.  RISK FACTORS Muscle strain is especially common in athletes.  SIGNS AND SYMPTOMS At the site of the muscle strain, there may be:  Pain.  Bruising.  Swelling.  Difficulty using the muscle due to pain or lack of normal function. DIAGNOSIS  Your health care provider will perform a physical exam and ask about your medical history. TREATMENT  Often, the best treatment for a muscle strain is resting, icing, and applying cold compresses to the injured area.  HOME CARE INSTRUCTIONS   Use the PRICE method of treatment to promote muscle healing during the first 2-3 days after your injury. The PRICE method involves:  Protecting the muscle from being injured again.  Restricting your activity and resting the injured body part.  Icing your injury. To do this, put ice in a plastic bag. Place a towel between your skin and the bag. Then, apply the ice and leave it on from 15-20 minutes each hour. After the third day, switch to moist heat packs.  Apply compression to the injured area with a splint or elastic bandage. Be careful not to wrap it too tightly. This may interfere with blood circulation or increase swelling.  Elevate the injured body part above the level of your heart as often as you  can.  Only take over-the-counter or prescription medicines for pain, discomfort, or fever as directed by your health care provider.  Warming up prior to exercise helps to prevent future muscle strains. SEEK MEDICAL CARE IF:   You have increasing pain or swelling in the injured area.  You have numbness, tingling, or a significant loss of strength in the injured area. MAKE SURE YOU:   Understand these instructions.  Will watch your condition.  Will get help right away if you are not doing well or get worse. Document Released: 12/18/2004 Document Revised: 10/08/2012 Document Reviewed: 07/17/2012 [facility] Patient Information 2015 [place], [place]. This information is not intended to replace advice given to you by your health care provider. Make sure you discuss any questions you have with your health care provider. RICE: Routine Care for Injuries The routine care of many injuries includes Rest, Ice, Compression, and Elevation (RICE). HOME CARE INSTRUCTIONS  Rest is needed to allow your body to heal. Routine activities can usually be resumed when comfortable. Injured tendons and bones can take up to 6 weeks to heal. Tendons are the cord-like structures that attach muscle to bone.  Ice following an injury helps keep the swelling down and reduces pain.  Put ice in a plastic bag.  Place a towel between your skin and the bag.  Leave the ice on for 15-20 minutes, 3-4 times a day, or as directed by your health care provider. Do this while awake, for the first 24 to 48 hours. After that, continue as directed by your  caregiver.  Compression helps keep swelling down. It also gives support and helps with discomfort. If an elastic bandage has been applied, it should be removed and reapplied every 3 to 4 hours. It should not be applied tightly, but firmly enough to keep swelling down. Watch fingers or toes for swelling, bluish discoloration, coldness, numbness, or excessive pain. If any of these  problems occur, remove the bandage and reapply loosely. Contact your caregiver if these problems continue.  Elevation helps reduce swelling and decreases pain. With extremities, such as the arms, hands, legs, and feet, the injured area should be placed near or above the level of the heart, if possible. SEEK IMMEDIATE MEDICAL CARE IF:  You have persistent pain and swelling.  You develop redness, numbness, or unexpected weakness.  Your symptoms are getting worse rather than improving after several days. These symptoms may indicate that further evaluation or further X-rays are needed. Sometimes, X-rays may not show a small broken bone (fracture) until 1 week or 10 days later. Make a follow-up appointment with your caregiver. Ask when your X-ray results will be ready. Make sure you get your X-ray results. Document Released: 04/01/2000 Document Revised: 12/23/2012 Document Reviewed: 05/19/2010 [facility] Patient Information 2015 [place], [place]. This information is not intended to replace advice given to you by your health care provider. Make sure you discuss any questions you have with your health care provider.

## 2013-08-30 NOTE — ED Notes (Signed)
Declined W/C at D/C and was escorted to lobby by RN. 

## 2013-08-30 NOTE — ED Provider Notes (Signed)
CSN: [id number]     Arrival date & time 08/30/13  1241 History  This chart was scribed for Michele Mcalpine, PA-C, working with Fredia Sorrow, MD by Steva Colder, ED Scribe. The patient was seen in room TR06C/TR06C at 1:40 PM.   Chief Complaint  Patient presents with  . Leg Pain      The history is provided by the patient. No language interpreter was used.   HPI Comments: LASHAI Hawkins is a 53 y.o. female who presents to the Emergency Department complaining of left calf pain onset 5 days ago. Pt states she was at work Tuesday and moving a chair when she felt a ripping sensation in her left calf. She moved the chair with her knees and that is when the pain came. She reports she felt the a sharp pain again today when she was bending over her bed 45 minutes ago PTA. She had her knees against the bed when she felt the sensation again. The pain eased out and then came back today. Pt has not tried any medications for relief. She denies any other associated symptoms. She works with Water engineer at [employer].   PCPRobyn Haber, MD   Past Medical History  Diagnosis Date  . Urinary urgency   . Pain, joint, shoulder region, right   . Headache(784.0)   . Hypertension   . Allergy   . Blood transfusion   . GERD (gastroesophageal reflux disease)   . Tuberculosis     7 YEARS AGO  . Blood transfusion without reported diagnosis   . Arthritis   . Substance abuse   . Heart murmur     very mild-echo 1/15-normal   Past Surgical History  Procedure Laterality Date  . Tubal ligation    . Breast lumpectomy  2012    Underarm-rt-extra tissue  . Colonoscopy    . Upper gi endoscopy    . Ethmoidectomy Left 07/10/2013    Procedure: ETHMOIDECTOMY LEFT,MAXILLARY OSTIAL ENLARGEMENT WITH REMOVAL OF DISEASE;  Surgeon: Rozetta Nunnery, MD;  Location: [place];  Service: ENT;  Laterality: Left;  . Turbinate reduction Left 07/10/2013    Procedure: LEFT TURBINATE REDUCTION;   Surgeon: Rozetta Nunnery, MD;  Location: [place];  Service: ENT;  Laterality: Left;  . Nose surgery     Family History  Problem Relation Age of Onset  . Throat cancer Father     was a smoker  . Colon cancer Father   . Dementia Other   . HIV Sister   . Stroke Mother   . Diabetes Paternal Grandmother   . Diabetes Maternal Grandmother    History  Substance Use Topics  . Smoking status: Former Smoker -- 0.30 packs/day for 15 years    Types: Cigarettes    Quit date: 01/01/2001  . Smokeless tobacco: Never Used     Comment: "never inhaled"  . Alcohol Use: No   OB History   Grav Para Term Preterm Abortions TAB SAB Ect Mult Living                 Review of Systems  Musculoskeletal: Positive for arthralgias (left leg).    A complete 10 system review of systems was obtained and all systems are negative except as noted in the HPI and PMH.    Allergies  Banana and Tomato  Home Medications   Prior to Admission medications   Medication Sig Start Date End Date Taking? Authorizing Provider  amLODipine (NORVASC) 10  MG tablet Take 1 tablet (10 mg total) by mouth daily. 07/07/12  Yes Jackolyn Geron Haber, MD  aspirin-acetaminophen-caffeine [facility] MIGRAINE) [phone] MG per tablet Take 1 tablet by mouth every 6 (six) hours as needed for headache.   Yes Historical Provider, MD  cephALEXin (KEFLEX) 500 MG capsule Take 500 mg by mouth 2 (two) times daily. 07/10/13  Yes Rozetta Nunnery, MD  Cholecalciferol (VITAMIN D PO) Take 1 tablet by mouth daily.   Yes Historical Provider, MD  esomeprazole (NEXIUM) 40 MG capsule Take 1 capsule (40 mg total) by mouth daily before breakfast. 04/30/13  Yes Narya Beavin Haber, MD  FERROUS SULFATE PO Take 1 tablet by mouth daily.   Yes Historical Provider, MD  fluticasone (FLONASE) 50 MCG/ACT nasal spray Place 2 sprays into both nostrils daily. 04/02/13  Yes Ryan M Dunn, PA-C  hydrochlorothiazide (MICROZIDE) 12.5 MG capsule Take 1 capsule  (12.5 mg total) by mouth daily. 06/17/13  Yes Kedar Sedano Haber, MD  LORazepam (ATIVAN) 0.5 MG tablet Take 1 tablet (0.5 mg total) by mouth at bedtime as needed for anxiety. 04/30/13  Yes Cecile Guevara Haber, MD  meclizine (ANTIVERT) 25 MG tablet Take 25 mg by mouth 3 (three) times daily as needed for dizziness.   Yes Historical Provider, MD  methocarbamol (ROBAXIN) 500 MG tablet Take 500 mg by mouth 2 (two) times daily as needed for muscle spasms.   Yes Historical Provider, MD  Multiple Vitamins-Minerals (HAIR/SKIN/NAILS) TABS Take 1 tablet by mouth daily.   Yes Historical Provider, MD  SUMAtriptan (IMITREX) 100 MG tablet Take 1 tablet (100 mg total) by mouth every 2 (two) hours as needed for migraine. 10/03/12  Yes Terryl Molinelli Haber, MD  naproxen (NAPROSYN) 500 MG tablet Take 1 tablet (500 mg total) by mouth 2 (two) times daily. 08/30/13   Illene Labrador, PA-C   BP 144/85  Pulse 77  Temp(Src) 98.7 F (37.1 C) (Oral)  Resp 18  Ht 5\' 5"  (1.651 m)  Wt 226 lb (102.513 kg)  BMI 37.61 kg/m2  SpO2 95%  Physical Exam  Nursing note and vitals reviewed. Constitutional: She is oriented to person, place, and time. She appears well-developed and well-nourished. No distress.  HENT:  Head: Normocephalic and atraumatic.  Mouth/Throat: Oropharynx is clear and moist.  Eyes: Conjunctivae and EOM are normal.  Neck: Normal range of motion. Neck supple.  Cardiovascular: Normal rate, regular rhythm and normal heart sounds.   Pulmonary/Chest: Effort normal and breath sounds normal. No respiratory distress.  Musculoskeletal: Normal range of motion. She exhibits tenderness. She exhibits no edema.  Achilles tendon intact. No tenderness over achilles tendon. Tenderness over the proximal aspect of the left calf with no swelling or ecchymosis. Full plantar and dorsal flexion. Full flexion and extension of knee.   Neurological: She is alert and oriented to person, place, and time. No sensory deficit.  Skin: Skin is warm and  dry.  Psychiatric: She has a normal mood and affect. Her behavior is normal.    ED Course  Procedures (including critical care time) DIAGNOSTIC STUDIES: Oxygen Saturation is 95% on room air, normal by my interpretation.    COORDINATION OF CARE: 1:43 PM-Discussed treatment plan which includes Naproxen, Ace Wrap, and Referral to an Orthopedist if the pain persists with pt at bedside and pt agreed to plan.   Labs Review Labs Reviewed - No data to display  Imaging Review No results found.   EKG Interpretation None      MDM   Final diagnoses:  Strain of  calf muscle, left, initial encounter    Patient was in with left calf pain after injury. She is nontoxic-appearing and in no apparent distress. Afebrile, vital signs stable. Neurovascularly intact. Full range of motion, no evidence of tendinous disruption. Ace wrap applied. Advised and says, rest and ice. Followup with PCP, orthopedics if no improvement. Stable for discharge. Return precautions given. Patient states understanding of treatment care plan and is agreeable.  I personally performed the services described in this documentation, which was scribed in my presence. The recorded information has been reviewed and is accurate.    Illene Labrador, PA-C 08/30/13 1349

## 2013-08-31 NOTE — ED Provider Notes (Signed)
Medical screening examination/treatment/procedure(s) were performed by non-physician practitioner and as supervising physician I was immediately available for consultation/collaboration.   EKG Interpretation None        Fredia Sorrow, MD 08/31/13 1118

## 2013-09-03 ENCOUNTER — Telehealth: Payer: Self-pay | Admitting: Family Medicine

## 2013-09-03 NOTE — Telephone Encounter (Signed)
Patient's employer faxed FMLA forms to be completed by Dr. Joseph Art. Patient states her FMLA is for vertigo and migraines. 5-7 business days for completion.

## 2013-09-04 NOTE — Telephone Encounter (Signed)
LM for pt to RTC- rtn call pt was not taken out of work during the visit indicated for the [employer] paperwork. Pt should RTC for re evaluation. Paperwork is in the nurses box.

## 2013-09-04 NOTE — Telephone Encounter (Signed)
Patient returned call stated she would come in to the walk in clinic on Monday 09/07/13 when we open to see Dr. Joseph Art.

## 2013-09-09 ENCOUNTER — Ambulatory Visit (INDEPENDENT_AMBULATORY_CARE_PROVIDER_SITE_OTHER): Payer: 59 | Admitting: Family Medicine

## 2013-09-09 VITALS — BP 138/88 | HR 76 | Temp 98.3°F | Resp 18 | Ht 66.0 in | Wt 222.4 lb

## 2013-09-09 DIAGNOSIS — S86812A Strain of other muscle(s) and tendon(s) at lower leg level, left leg, initial encounter: Secondary | ICD-10-CM

## 2013-09-09 DIAGNOSIS — H8110 Benign paroxysmal vertigo, unspecified ear: Secondary | ICD-10-CM

## 2013-09-09 DIAGNOSIS — G43909 Migraine, unspecified, not intractable, without status migrainosus: Secondary | ICD-10-CM

## 2013-09-09 DIAGNOSIS — S838X9A Sprain of other specified parts of unspecified knee, initial encounter: Secondary | ICD-10-CM

## 2013-09-09 DIAGNOSIS — J329 Chronic sinusitis, unspecified: Secondary | ICD-10-CM

## 2013-09-09 DIAGNOSIS — S86819A Strain of other muscle(s) and tendon(s) at lower leg level, unspecified leg, initial encounter: Secondary | ICD-10-CM

## 2013-09-09 MED ORDER — AMOXICILLIN 875 MG PO TABS: 875.0000 mg | ORAL_TABLET | Freq: Two times a day (BID) | ORAL | Status: DC

## 2013-09-09 NOTE — Progress Notes (Signed)
This is a 53 year old woman who works at [employer] ([place]) and has periodic migraines, positional vertigo, and sinus problems. She needs a FMLA form filled out to justify her absences once a month for up to 3 days when her migraines or vertigo asked up  Patient gets vomiting with her migraines.  Patient underwent sinus surgery July 20 of this year by Dr. Lucia Gaskins area and she was released 2 weeks later. She continues to have a foul smell in her nose with nasal discharge.  Recently patient strained her calf when reaching over her bed. She strained her left calf but it's getting better now and she is able to walk and carry out her duties without problems. The pain has not extended to the thigh, she's had no chest pain or shortness of breath, she's had no edema or ecchymosis.  Objective: No acute distress HEENT unremarkable except for mucoid purulent discharge in her nose on the left nasal passage. Neck: Supple no adenopathy Extremities: Mild swelling left calf without significant tenderness, full range of motion and normal gait  Assessment: Recent calf strain, recent sinus surgery, sinus infection, chronic intermittent migraines in 9 positional vertigo  Unspecified sinusitis (chronic) - Plan: amoxicillin (AMOXIL) 875 MG tablet  Strain of calf muscle, left, initial encounter  Benign paroxysmal positional vertigo, unspecified laterality  Migraine, unspecified, without mention of intractable migraine without mention of status migrainosus  Signed, [name], MD

## 2013-09-17 ENCOUNTER — Telehealth: Payer: Self-pay

## 2013-09-17 NOTE — Telephone Encounter (Signed)
Pt FMLA ppw faxed to [employer]

## 2013-11-12 ENCOUNTER — Ambulatory Visit (INDEPENDENT_AMBULATORY_CARE_PROVIDER_SITE_OTHER): Payer: 59 | Admitting: Emergency Medicine

## 2013-11-12 VITALS — BP 126/84 | HR 73 | Temp 98.2°F | Resp 16 | Ht 66.75 in | Wt 224.2 lb

## 2013-11-12 DIAGNOSIS — R51 Headache: Secondary | ICD-10-CM

## 2013-11-12 DIAGNOSIS — R519 Headache, unspecified: Secondary | ICD-10-CM

## 2013-11-12 LAB — POCT CBC
Granulocyte percent: 61.2 %G (ref 37–80)
HCT, POC: 40.1 % (ref 37.7–47.9)
HEMOGLOBIN: 12.4 g/dL (ref 12.2–16.2)
Lymph, poc: 1.8 (ref 0.6–3.4)
MCH: 28.3 pg (ref 27–31.2)
MCHC: 30.8 g/dL — AB (ref 31.8–35.4)
MCV: 91.9 fL (ref 80–97)
MID (cbc): 0.4 (ref 0–0.9)
MPV: 8.8 fL (ref 0–99.8)
PLATELET COUNT, POC: 250 10*3/uL (ref 142–424)
POC Granulocyte: 3.4 (ref 2–6.9)
POC LYMPH %: 32.1 % (ref 10–50)
POC MID %: 6.7 % (ref 0–12)
RBC: 4.36 M/uL (ref 4.04–5.48)
RDW, POC: 13.8 %
WBC: 5.5 10*3/uL (ref 4.6–10.2)

## 2013-11-12 LAB — C-REACTIVE PROTEIN: CRP: <0.5 mg/dL (ref <0.60)

## 2013-11-12 LAB — POCT SEDIMENTATION RATE: POCT SED RATE: 46 mm/h — AB (ref 0–22)

## 2013-11-12 MED ORDER — AMOXICILLIN-POT CLAVULANATE 875-125 MG PO TABS: 1.0000 | ORAL_TABLET | Freq: Two times a day (BID) | ORAL | Status: DC

## 2013-11-12 MED ORDER — PSEUDOEPHEDRINE-GUAIFENESIN ER 60-600 MG PO TB12: 1.0000 | ORAL_TABLET | Freq: Two times a day (BID) | ORAL | Status: DC

## 2013-11-12 NOTE — Progress Notes (Signed)
[facility] [address], [place] [address] [phone]  Date:  11/12/2013   Name:  Brianna Hawkins   DOB:  [birth year]   MRN:  [id number]  PCP:  [name], MD    Chief Complaint: Headache and Nausea   History of Present Illness:  Brianna Hawkins is a 53 y.o. very pleasant female patient who presents with the following:  Recently underwent ethmoidectomy and left maxillary ostial enlargement in July.  Now has severe left maxillary pain and tenderness associated with purulent post nasal drainage Says since Tuesday night. No fever but says chilled at times No sore throat or cough.  No wheezing or shortness of breath No nausea or vomiting. No stool change or rash No improvement with over the counter medications or other home remedies.  Denies other complaint or health concern today.   Patient Active Problem List   Diagnosis Date Noted  . Chronic sinusitis 07/06/2013  . Palpitations 02/18/2012  . BMI 34.0-34.9,adult 08/02/2011  . Chest pain 04/21/2011  . Abnormal CXR 04/21/2011  . Dyspnea 04/21/2011  . Hypertension 02/08/2011  . Vertigo, benign positional 02/08/2011  . Insomnia 02/08/2011  . Headache 02/07/2011    Past Medical History  Diagnosis Date  . Urinary urgency   . Pain, joint, shoulder region, right   . Headache(784.0)   . Hypertension   . Allergy   . Blood transfusion   . GERD (gastroesophageal reflux disease)   . Tuberculosis     7 YEARS AGO  . Blood transfusion without reported diagnosis   . Arthritis   . Substance abuse   . Heart murmur     very mild-echo 1/15-normal    Past Surgical History  Procedure Laterality Date  . Tubal ligation    . Breast lumpectomy  2012    Underarm-rt-extra tissue  . Colonoscopy    . Upper gi endoscopy    . Ethmoidectomy Left 07/10/2013    Procedure: ETHMOIDECTOMY LEFT,MAXILLARY OSTIAL ENLARGEMENT WITH REMOVAL OF DISEASE;  Surgeon: Rozetta Nunnery, MD;  Location: [place];  Service: ENT;  Laterality: Left;  . Turbinate reduction Left 07/10/2013    Procedure: LEFT TURBINATE REDUCTION;  Surgeon: Rozetta Nunnery, MD;  Location: [place];  Service: ENT;  Laterality: Left;  . Nose surgery      History  Substance Use Topics  . Smoking status: Former Smoker -- 0.30 packs/day for 15 years    Types: Cigarettes    Quit date: 01/01/2001  . Smokeless tobacco: Never Used     Comment: "never inhaled"  . Alcohol Use: No    Family History  Problem Relation Age of Onset  . Throat cancer Father     was a smoker  . Colon cancer Father   . Dementia Other   . HIV Sister   . Stroke Mother   . Diabetes Paternal Grandmother   . Diabetes Maternal Grandmother     Allergies  Allergen Reactions  . Banana Itching    Of throat  . Tomato Rash    Medication list has been reviewed and updated.  Current Outpatient Prescriptions on File Prior to Visit  Medication Sig Dispense Refill  . amLODipine (NORVASC) 10 MG tablet Take 1 tablet (10 mg total) by mouth daily. 90 tablet 3  . amoxicillin (AMOXIL) 875 MG tablet Take 1 tablet (875 mg total) by mouth 2 (two) times daily. 20 tablet 0  . aspirin-acetaminophen-caffeine (EXCEDRIN MIGRAINE) [phone] MG per tablet  Take 1 tablet by mouth every 6 (six) hours as needed for headache.    . Cholecalciferol (VITAMIN D PO) Take 1 tablet by mouth daily.    Marland Kitchen esomeprazole (NEXIUM) 40 MG capsule Take 1 capsule (40 mg total) by mouth daily before breakfast. 30 capsule 5  . FERROUS SULFATE PO Take 1 tablet by mouth daily.    . fluticasone (FLONASE) 50 MCG/ACT nasal spray Place 2 sprays into both nostrils daily. 16 g 1  . hydrochlorothiazide (MICROZIDE) 12.5 MG capsule Take 1 capsule (12.5 mg total) by mouth daily. 90 capsule 3  . LORazepam (ATIVAN) 0.5 MG tablet Take 1 tablet (0.5 mg total) by mouth at bedtime as needed for anxiety. 30 tablet 1  . meclizine (ANTIVERT) 25 MG tablet Take 25 mg by mouth 3  (three) times daily as needed for dizziness.    . methocarbamol (ROBAXIN) 500 MG tablet Take 500 mg by mouth 2 (two) times daily as needed for muscle spasms.    . Multiple Vitamins-Minerals (HAIR/SKIN/NAILS) TABS Take 1 tablet by mouth daily.    . naproxen (NAPROSYN) 500 MG tablet Take 1 tablet (500 mg total) by mouth 2 (two) times daily. 15 tablet 0  . SUMAtriptan (IMITREX) 100 MG tablet Take 1 tablet (100 mg total) by mouth every 2 (two) hours as needed for migraine. 10 tablet 0  . cephALEXin (KEFLEX) 500 MG capsule Take 500 mg by mouth 2 (two) times daily.     No current facility-administered medications on file prior to visit.    Review of Systems:   As per HPI, otherwise negative.     Physical Examination: Filed Vitals:   11/12/13 1521  BP: 126/84  Pulse: 73  Temp: 98.2 F (36.8 C)  Resp: 16   Filed Vitals:   11/12/13 1521  Height: 5' 6.75" (1.695 m)  Weight: 224 lb 3.2 oz (101.696 kg)   Body mass index is 35.4 kg/(m^2). Ideal Body Weight: Weight in (lb) to have BMI = 25: 158.1     GEN: WDWN, moderate distress, Non-toxic, Alert & Oriented x 3 HEENT: Atraumatic, Normocephalic. Tender left maxillary sinus Tender over left temple.  Pulse absent Ears and Nose: No external deformity. EXTR: No clubbing/cyanosis/edema NEURO: Normal gait.  PSYCH: Normally interactive. Conversant. Not depressed or anxious appearing.  Calm demeanor.    Assessment and Plan: Left maxillary sinusitis ?temporal arteritis augmentin mucinex d Sed rate CBC   Signed,  Ellison Carwin, MD

## 2013-11-12 NOTE — Patient Instructions (Signed)

## 2013-12-15 ENCOUNTER — Telehealth: Payer: Self-pay

## 2013-12-15 NOTE — Telephone Encounter (Signed)
LM for pt to RTC- unable to refill abx

## 2013-12-15 NOTE — Telephone Encounter (Signed)
Refill on Amoxacillin.   [phone]

## 2013-12-31 ENCOUNTER — Ambulatory Visit (INDEPENDENT_AMBULATORY_CARE_PROVIDER_SITE_OTHER): Payer: 59 | Admitting: Physician Assistant

## 2013-12-31 ENCOUNTER — Other Ambulatory Visit: Payer: Self-pay | Admitting: Family Medicine

## 2013-12-31 VITALS — BP 138/90 | HR 71 | Temp 97.8°F | Resp 16 | Ht 66.0 in | Wt 226.2 lb

## 2013-12-31 DIAGNOSIS — J0101 Acute recurrent maxillary sinusitis: Secondary | ICD-10-CM

## 2013-12-31 MED ORDER — GUAIFENESIN ER 1200 MG PO TB12: 1.0000 | ORAL_TABLET | Freq: Two times a day (BID) | ORAL | Status: AC

## 2013-12-31 MED ORDER — AMOXICILLIN-POT CLAVULANATE 875-125 MG PO TABS: 1.0000 | ORAL_TABLET | Freq: Two times a day (BID) | ORAL | Status: DC

## 2013-12-31 NOTE — Patient Instructions (Signed)
[facility]  Call Dr Lucia Gaskins

## 2013-12-31 NOTE — Progress Notes (Signed)
Subjective:    Patient ID: Brianna Hawkins, female    DOB: 04-Aug-1960, 53 y.o.   MRN: [id number]  HPI Pt presents to clinic with sinus pressure that has been going on for the last 2 weeks.  She does not have teeth pain but she had sinus surgery in July on the Left maxillary and that is where all her pressure is located.  She does not drink much water but she does use her Flonase and a nasal saline rinse daily.  She is blowing out thick yellow mucus mainly from the left side of the nose.  She is having some headache but no dizziness.  Sinus surgery Dr Lucia Gaskins in July.  Patient Active Problem List   Diagnosis Date Noted  . Chronic sinusitis 07/06/2013  . Palpitations 02/18/2012  . BMI 34.0-34.9,adult 08/02/2011  . Chest pain 04/21/2011  . Abnormal CXR 04/21/2011  . Dyspnea 04/21/2011  . Hypertension 02/08/2011  . Vertigo, benign positional 02/08/2011  . Insomnia 02/08/2011  . Headache 02/07/2011   Prior to Admission medications   Medication Sig Start Date End Date Taking? Authorizing Provider  amLODipine (NORVASC) 10 MG tablet Take 1 tablet (10 mg total) by mouth daily. 07/07/12  Yes [name], MD  amoxicillin-clavulanate (AUGMENTIN) 875-125 MG per tablet Take 1 tablet by mouth 2 (two) times daily.   Yes Historical Provider, MD  aspirin-acetaminophen-caffeine (EXCEDRIN MIGRAINE) [phone] MG per tablet Take 1 tablet by mouth every 6 (six) hours as needed for headache.   Yes Historical Provider, MD  Cholecalciferol (VITAMIN D PO) Take 1 tablet by mouth daily.   Yes Historical Provider, MD  esomeprazole (NEXIUM) 40 MG capsule Take 1 capsule (40 mg total) by mouth daily before breakfast. 04/30/13  Yes [name], MD  FERROUS SULFATE PO Take 1 tablet by mouth daily.   Yes Historical Provider, MD  fluticasone (FLONASE) 50 MCG/ACT nasal spray Place 2 sprays into both nostrils daily. 04/02/13  Yes Ryan M Dunn, PA-C  hydrochlorothiazide (MICROZIDE) 12.5 MG capsule Take 1 capsule (12.5  mg total) by mouth daily. 06/17/13  Yes [name], MD  LORazepam (ATIVAN) 0.5 MG tablet Take 1 tablet (0.5 mg total) by mouth at bedtime as needed for anxiety. 04/30/13  Yes [name], MD  meclizine (ANTIVERT) 25 MG tablet Take 25 mg by mouth 3 (three) times daily as needed for dizziness.   Yes Historical Provider, MD  methocarbamol (ROBAXIN) 500 MG tablet Take 500 mg by mouth 2 (two) times daily as needed for muscle spasms.   Yes Historical Provider, MD  Multiple Vitamins-Minerals (HAIR/SKIN/NAILS) TABS Take 1 tablet by mouth daily.   Yes Historical Provider, MD  naproxen (NAPROSYN) 500 MG tablet Take 1 tablet (500 mg total) by mouth 2 (two) times daily. 08/30/13  Yes Robyn M Hess, PA-C  SUMAtriptan (IMITREX) 100 MG tablet Take 1 tablet (100 mg total) by mouth every 2 (two) hours as needed for migraine. 10/03/12  Yes [name], MD   Allergies  Allergen Reactions  . Banana Itching    Of throat  . Tomato Rash    Medications, allergies, past medical history, surgical history, family history, social history and problem list reviewed and updated.    Review of Systems  Constitutional: Negative for fever and chills.  HENT: Positive for congestion, postnasal drip, rhinorrhea and sinus pressure. Negative for sore throat.   Respiratory: Negative for cough.   Neurological: Positive for headaches. Negative for dizziness.       Objective:   Physical  Exam  Constitutional: She is oriented to person, place, and time. She appears well-developed and well-nourished.  BP 138/90 mmHg  Pulse 71  Temp(Src) 97.8 F (36.6 C) (Oral)  Resp 16  Ht 5\' 6"  (1.676 m)  Wt 226 lb 3.2 oz (102.604 kg)  BMI 36.53 kg/m2  SpO2 98%   HENT:  Head: Normocephalic and atraumatic.  Right Ear: Hearing, tympanic membrane, external ear and ear canal normal.  Left Ear: Hearing, tympanic membrane, external ear and ear canal normal.  Nose: Nose normal. Right sinus exhibits no maxillary sinus tenderness. Left  sinus exhibits no maxillary sinus tenderness (tenderness is to the left side of the nose more at the sinus opening).  Mouth/Throat: Uvula is midline, oropharynx is clear and moist and mucous membranes are normal.  Cardiovascular: Normal rate, regular rhythm and normal heart sounds.   No murmur heard. Pulmonary/Chest: Effort normal and breath sounds normal.  Neurological: She is alert and oriented to person, place, and time.  Skin: Skin is warm and dry.  Psychiatric: She has a normal mood and affect. Her behavior is normal. Judgment and thought content normal.      Assessment & Plan:  Acute recurrent maxillary sinusitis - Plan: amoxicillin-clavulanate (AUGMENTIN) 875-125 MG per tablet, Guaifenesin (MUCINEX MAXIMUM STRENGTH) 1200 MG TB12   She needs to increase her water intake.  She will call Dr Lucia Gaskins because she has had what appears to be 2 sinus infections since her surgery and I wonder if she has some remain swelling at the sinus opening due to possibly a stitch remaining.  We will treat her with abx today.  Brianna Hummingbird PA-C  [facility] 12/31/2013 3:25 PM

## 2014-02-11 ENCOUNTER — Ambulatory Visit (INDEPENDENT_AMBULATORY_CARE_PROVIDER_SITE_OTHER): Payer: 59 | Admitting: Family Medicine

## 2014-02-11 ENCOUNTER — Encounter: Payer: Self-pay | Admitting: Family Medicine

## 2014-02-11 VITALS — BP 136/91 | HR 67 | Temp 98.1°F | Resp 16 | Ht 65.5 in | Wt 225.0 lb

## 2014-02-11 DIAGNOSIS — R232 Flushing: Secondary | ICD-10-CM

## 2014-02-11 DIAGNOSIS — J0101 Acute recurrent maxillary sinusitis: Secondary | ICD-10-CM

## 2014-02-11 DIAGNOSIS — N951 Menopausal and female climacteric states: Secondary | ICD-10-CM

## 2014-02-11 DIAGNOSIS — H6502 Acute serous otitis media, left ear: Secondary | ICD-10-CM

## 2014-02-11 MED ORDER — AMOXICILLIN-POT CLAVULANATE 875-125 MG PO TABS: 1.0000 | ORAL_TABLET | Freq: Two times a day (BID) | ORAL | Status: DC

## 2014-02-11 MED ORDER — IPRATROPIUM BROMIDE 0.03 % NA SOLN: 2.0000 | Freq: Two times a day (BID) | NASAL | Status: DC

## 2014-02-11 NOTE — Progress Notes (Signed)
This chart was scribed for [name], MD by Edison Simon, ED Scribe.   Patient ID: Brianna Hawkins MRN: [id number], DOB: [birth year], 54 y.o. Date of Encounter: 02/11/2014, 1:39 PM  Primary Physician: [name], MD  Chief Complaint:  Chief Complaint  Patient presents with  . Follow-up  . Hypertension  . Hot Flashes    x 1 yr  . Ear Pain    left ear, x 3 weeks     HPI: 54 y.o. year old female with history below significant for HTN presents with for followup. She also complains ear pain with onset 3 weeks ago. She states that he hears a noise "like wind" when walking. She denies problems to her right ear. She also notes left-sided sinus bleeding. She states she has Flonase.  She complains of hot flashes approximately 5x a day for the past year. She states she is no longer having periods but states she had 2 days of dark bleeding 2 months ago.   Patient works at [employer]   Past Medical History  Diagnosis Date  . Urinary urgency   . Pain, joint, shoulder region, right   . Headache(784.0)   . Hypertension   . Allergy   . Blood transfusion   . GERD (gastroesophageal reflux disease)   . Tuberculosis     7 YEARS AGO  . Blood transfusion without reported diagnosis   . Arthritis   . Substance abuse   . Heart murmur     very mild-echo 1/15-normal     Home Meds: Prior to Admission medications   Medication Sig Start Date End Date Taking? Authorizing Provider  amLODipine (NORVASC) 10 MG tablet TAKE 1 TABLET BY MOUTH ONCE DAILY.  "NEEDS OFFICE VISIT FOR ADDITIONAL REFILLS" 01/01/14  Yes Chelle S Jeffery, PA-C  aspirin-acetaminophen-caffeine (EXCEDRIN MIGRAINE) [phone] MG per tablet Take 1 tablet by mouth every 6 (six) hours as needed for headache.   Yes Historical Provider, MD  Cholecalciferol (VITAMIN D PO) Take 1 tablet by mouth daily.   Yes Historical Provider, MD  esomeprazole (NEXIUM) 40 MG capsule Take 1 capsule (40 mg total) by mouth daily before breakfast.  04/30/13  Yes [name], MD  FERROUS SULFATE PO Take 1 tablet by mouth daily.   Yes Historical Provider, MD  fluticasone (FLONASE) 50 MCG/ACT nasal spray Place 2 sprays into both nostrils daily. 04/02/13  Yes Ryan M Dunn, PA-C  hydrochlorothiazide (MICROZIDE) 12.5 MG capsule Take 1 capsule (12.5 mg total) by mouth daily. 06/17/13  Yes [name], MD  LORazepam (ATIVAN) 0.5 MG tablet Take 1 tablet (0.5 mg total) by mouth at bedtime as needed for anxiety. 04/30/13  Yes [name], MD  meclizine (ANTIVERT) 25 MG tablet Take 25 mg by mouth 3 (three) times daily as needed for dizziness.   Yes Historical Provider, MD  methocarbamol (ROBAXIN) 500 MG tablet Take 500 mg by mouth 2 (two) times daily as needed for muscle spasms.   Yes Historical Provider, MD  Multiple Vitamins-Minerals (HAIR/SKIN/NAILS) TABS Take 1 tablet by mouth daily.   Yes Historical Provider, MD  naproxen (NAPROSYN) 500 MG tablet Take 1 tablet (500 mg total) by mouth 2 (two) times daily. 08/30/13  Yes Robyn M Hess, PA-C  SUMAtriptan (IMITREX) 100 MG tablet Take 1 tablet (100 mg total) by mouth every 2 (two) hours as needed for migraine. 10/03/12  Yes [name], MD    Allergies:  Allergies  Allergen Reactions  . Banana Itching    Of throat  .  Tomato Rash    History   Social History  . Marital Status: Single    Spouse Name: N/A  . Number of Children: 4  . Years of Education: N/A   Occupational History  . [place]   Social History Main Topics  . Smoking status: Former Smoker -- 0.30 packs/day for 15 years    Types: Cigarettes    Quit date: 01/01/2001  . Smokeless tobacco: Never Used     Comment: "never inhaled"  . Alcohol Use: No  . Drug Use: No  . Sexual Activity: Yes   Other Topics Concern  . Not on file   Social History Narrative   Single. Education: [employer].     Review of Systems: Constitutional: negative for chills, fever, night sweats, weight changes, or fatigue,  positive hot flashes HEENT: negative for vision changes, hearing loss, congestion, rhinorrhea, ST, or sinus pressure; positive ear pain, nose bleed Cardiovascular: negative for chest pain or palpitations Respiratory: negative for hemoptysis, wheezing, shortness of breath, or cough Abdominal: negative for abdominal pain, nausea, vomiting, diarrhea, or constipation Dermatological: negative for rash Neurologic: negative for headache, dizziness, or syncope All other systems reviewed and are otherwise negative with the exception to those above and in the HPI.   Physical Exam: Blood pressure 136/91, pulse 67, temperature 98.1 F (36.7 C), resp. rate 16, height 5' 5.5" (1.664 m), weight 225 lb (102.059 kg), last menstrual period 12/11/2013, SpO2 96 %., Body mass index is 36.86 kg/(m^2). General: Well developed, well nourished, in no acute distress. Head: Normocephalic, atraumatic, eyes without discharge, sclera non-icteric, nares are without discharge. Bilateral auditory canals clear, TM's are without perforation, pearly grey and translucent with reflective cone of light on right. Oral cavity moist, posterior pharynx without exudate, erythema, peritonsillar abscess, or post nasal drip.  some bleeding in left nostril, retracted left ear drum with white serous fluid behind it Neck: Supple. No thyromegaly. Full ROM. No lymphadenopathy. Lungs: Clear bilaterally to auscultation without wheezes, rales, or rhonchi. Breathing is unlabored. Heart: RRR with S1 S2. No murmurs, rubs, or gallops appreciated. Blood pressure measured at 130/88 manually Abdomen: Soft, non-tender, non-distended with normoactive bowel sounds. No hepatomegaly. No rebound/guarding. No obvious abdominal masses. Msk:  Strength and tone normal for age. Extremities/Skin: Warm and dry. No clubbing or cyanosis. No edema. No rashes or suspicious lesions. Neuro: Alert and oriented X 3. Moves all extremities spontaneously. Gait is normal. CNII-XII  grossly in tact. Psych:  Responds to questions appropriately with a normal affect.    ASSESSMENT AND PLAN:  54 y.o. year old female with  This chart was scribed in my presence and reviewed by me personally.    ICD-9-CM ICD-10-CM   1. Hot flashes 638.4 Y65.9 Follicle Stimulating Hormone  2. Acute recurrent maxillary sinusitis 461.0 J01.01 amoxicillin-clavulanate (AUGMENTIN) 875-125 MG per tablet  3. Acute serous otitis media of left ear, recurrence not specified 381.01 H65.02 ipratropium (ATROVENT) 0.03 % nasal spray     Signed, [name], MD 02/11/2014 1:39 PM

## 2014-02-12 LAB — FOLLICLE STIMULATING HORMONE: FSH: 84.7 m[IU]/mL

## 2014-02-17 ENCOUNTER — Telehealth: Payer: Self-pay | Admitting: *Deleted

## 2014-02-17 NOTE — Telephone Encounter (Signed)
Pt called regarding her lab [facility]) results.  Her results were given and I informed her of the new medication (vivelle dot(patch) 0.05 twice weekly with prometria 100 mg daily)  that Dr. Carlean Jews would like to start her on.  However, I did not see any orders in Epic.  Can we get this medication called in for her.  Pt is looking forward to getting started on this patch.

## 2014-02-18 ENCOUNTER — Other Ambulatory Visit: Payer: Self-pay | Admitting: Family Medicine

## 2014-02-18 DIAGNOSIS — N951 Menopausal and female climacteric states: Secondary | ICD-10-CM

## 2014-02-18 MED ORDER — PROGESTERONE MICRONIZED 100 MG PO CAPS: 100.0000 mg | ORAL_CAPSULE | Freq: Every day | ORAL | Status: DC

## 2014-02-18 MED ORDER — ESTRADIOL 0.025 MG/24HR TD PTTW: 1.0000 | MEDICATED_PATCH | TRANSDERMAL | Status: DC

## 2014-02-22 ENCOUNTER — Telehealth: Payer: Self-pay

## 2014-02-22 NOTE — Telephone Encounter (Signed)
Pt states that the estradiol (VIVELLE-DOT) 0.025 MG/24HR [[id number]]  Is not working for her. She states that she is still "soaking wt" from sweat. Please advise

## 2014-02-22 NOTE — Telephone Encounter (Signed)
Any suggestions or questions to ask patient?

## 2014-02-24 NOTE — Telephone Encounter (Signed)
I reviewed Dr. Pauletta Browns result note on the labs after her visit with him 2/11.  He said the next step was to verify that she was not pregnant, and if not, to start the medications.  I do not see that she returned for a pregnancy test. Please clarify that issue.  If the pregnancy test is NEGATIVE, the vivelle dose can be increased.

## 2014-02-26 ENCOUNTER — Ambulatory Visit (INDEPENDENT_AMBULATORY_CARE_PROVIDER_SITE_OTHER): Payer: 59 | Admitting: Internal Medicine

## 2014-02-26 ENCOUNTER — Ambulatory Visit (INDEPENDENT_AMBULATORY_CARE_PROVIDER_SITE_OTHER): Payer: 59

## 2014-02-26 VITALS — BP 130/82 | HR 69 | Temp 98.1°F | Resp 18 | Ht 65.5 in | Wt 225.0 lb

## 2014-02-26 DIAGNOSIS — R062 Wheezing: Secondary | ICD-10-CM

## 2014-02-26 DIAGNOSIS — R079 Chest pain, unspecified: Secondary | ICD-10-CM

## 2014-02-26 DIAGNOSIS — R059 Cough, unspecified: Secondary | ICD-10-CM

## 2014-02-26 DIAGNOSIS — R05 Cough: Secondary | ICD-10-CM

## 2014-02-26 DIAGNOSIS — J329 Chronic sinusitis, unspecified: Secondary | ICD-10-CM

## 2014-02-26 DIAGNOSIS — K219 Gastro-esophageal reflux disease without esophagitis: Secondary | ICD-10-CM

## 2014-02-26 MED ORDER — ALBUTEROL SULFATE HFA 108 (90 BASE) MCG/ACT IN AERS: 2.0000 | INHALATION_SPRAY | RESPIRATORY_TRACT | Status: DC | PRN

## 2014-02-26 MED ORDER — PREDNISONE 20 MG PO TABS: 40.0000 mg | ORAL_TABLET | Freq: Every day | ORAL | Status: DC

## 2014-02-26 MED ORDER — IPRATROPIUM BROMIDE 0.02 % IN SOLN
0.5000 mg | Freq: Once | RESPIRATORY_TRACT | Status: AC
  Administered 2014-02-26: 0.5 mg via RESPIRATORY_TRACT

## 2014-02-26 MED ORDER — ALBUTEROL SULFATE (2.5 MG/3ML) 0.083% IN NEBU
2.5000 mg | INHALATION_SOLUTION | Freq: Once | RESPIRATORY_TRACT | Status: AC
  Administered 2014-02-26: 2.5 mg via RESPIRATORY_TRACT

## 2014-02-26 NOTE — Progress Notes (Signed)
Subjective:    Patient ID: Brianna Hawkins, female    DOB: 1960-12-22, 54 y.o.   MRN: [id number]  Chief Complaint  Patient presents with  . Cough    x 1 week  . Chest Pain    SOB  and wheezing x 3 days   Patient Active Problem List   Diagnosis Date Noted  . Chronic sinusitis 07/06/2013  . Palpitations 02/18/2012  . BMI 34.0-34.9,adult 08/02/2011  . Chest pain 04/21/2011  . Abnormal CXR 04/21/2011  . Dyspnea 04/21/2011  . Hypertension 02/08/2011  . Vertigo, benign positional 02/08/2011  . Insomnia 02/08/2011  . Headache 02/07/2011   Prior to Admission medications   Medication Sig Start Date End Date Taking? Authorizing Provider  amLODipine (NORVASC) 10 MG tablet TAKE 1 TABLET BY MOUTH ONCE DAILY.  "NEEDS OFFICE VISIT FOR ADDITIONAL REFILLS" 01/01/14  Yes Chelle S Jeffery, PA-C  amoxicillin-clavulanate (AUGMENTIN) 875-125 MG per tablet Take 1 tablet by mouth 2 (two) times daily. 02/11/14  Yes [name], MD  aspirin-acetaminophen-caffeine [facility] MIGRAINE) [phone] MG per tablet Take 1 tablet by mouth every 6 (six) hours as needed for headache.   Yes Historical Provider, MD  Cholecalciferol (VITAMIN D PO) Take 1 tablet by mouth daily.   Yes Historical Provider, MD  esomeprazole (NEXIUM) 40 MG capsule Take 1 capsule (40 mg total) by mouth daily before breakfast. 04/30/13  Yes [name], MD  estradiol (VIVELLE-DOT) 0.025 MG/24HR Place 1 patch onto the skin 2 (two) times a week. 02/18/14  Yes [name], MD  FERROUS SULFATE PO Take 1 tablet by mouth daily.   Yes Historical Provider, MD  fluticasone (FLONASE) 50 MCG/ACT nasal spray Place 2 sprays into both nostrils daily. 04/02/13  Yes Ryan M Dunn, PA-C  hydrochlorothiazide (MICROZIDE) 12.5 MG capsule Take 1 capsule (12.5 mg total) by mouth daily. 06/17/13  Yes [name], MD  ipratropium (ATROVENT) 0.03 % nasal spray Place 2 sprays into both nostrils every 12 (twelve) hours. 02/11/14  Yes [name], MD    LORazepam (ATIVAN) 0.5 MG tablet Take 1 tablet (0.5 mg total) by mouth at bedtime as needed for anxiety. 04/30/13  Yes [name], MD  meclizine (ANTIVERT) 25 MG tablet Take 25 mg by mouth 3 (three) times daily as needed for dizziness.   Yes Historical Provider, MD  Multiple Vitamins-Minerals (HAIR/SKIN/NAILS) TABS Take 1 tablet by mouth daily.   Yes Historical Provider, MD  naproxen (NAPROSYN) 500 MG tablet Take 1 tablet (500 mg total) by mouth 2 (two) times daily. 08/30/13  Yes Robyn M Hess, PA-C  progesterone (PROMETRIUM) 100 MG capsule Take 1 capsule (100 mg total) by mouth daily. 02/18/14  Yes [name], MD  SUMAtriptan (IMITREX) 100 MG tablet Take 1 tablet (100 mg total) by mouth every 2 (two) hours as needed for migraine. 10/03/12  Yes [name], MD  methocarbamol (ROBAXIN) 500 MG tablet Take 500 mg by mouth 2 (two) times daily as needed for muscle spasms.    Historical Provider, MD   Medications, allergies, past medical history, surgical history, family history, social history and problem list reviewed and updated.  HPI  19 yof with pmh recurrent sinus infx, tb yrs ago, htn, and allergic rhinitis presents with cough, congestion, cp.   Sx started two wks ago with congestion, st. Was seen here two wks ago and got augmentin. She states she missed some doses and will finish the abx tomorrow. Abx helped with the congestion.   Three days ago the congestion made  way down to lungs. Severe st past 3 days. Coughing past three days, throughout day, green sputum. Has had both right and left sided cp past few days. Described as sharp. They are separate pains, last for seconds at at time. Most of the times the cps radiate through to the back btwn shoulder blades. This also lasts for several seconds then resolves. No aggravating, relieving factors. Denies any assoc ha, weakness, numbness, dizziness, syncope.   Has felt warm and had chills past few days.   Hx recurrent sinus infxns. Had  nasal surgery 7/15 which she was told would minimize her recurrent infxs. Has been tx with augmentin for sinusitis 4 times past 6 months. Uses flonase and nasal atrovent at home. Denies hx asthma. Has hx gerd and takes nexium daily. States that sometimes this doesn't always work and has to use pepto as well.  She works in hospital in [employer]. She also mentions occasional sharp cp episodes when she is working. These resolve after she stops what she is doing. Feels like maybe she wheezes when she has these episodes. 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  Review of Systems No hemoptysis, no abd pain. No palps. See HPI.     Objective:   Physical Exam  Constitutional: She is oriented to person, place, and time. She appears well-developed and well-nourished.  Non-toxic appearance. She does not have a sickly appearance. She does not appear ill. No distress.  BP 130/82 mmHg  Pulse 69  Temp(Src) 98.1 F (36.7 C) (Oral)  Resp 18  Ht 5' 5.5" (1.664 m)  Wt 225 lb (102.059 kg)  BMI 36.86 kg/m2  SpO2 96%  LMP 12/11/2013   HENT:  Right Ear: Tympanic membrane normal.  Left Ear: Tympanic membrane normal.  Nose: Mucosal edema and rhinorrhea present. Right sinus exhibits no maxillary sinus tenderness and no frontal sinus tenderness. Left sinus exhibits no maxillary sinus tenderness and no frontal sinus tenderness.  Mouth/Throat: Uvula is midline, oropharynx is clear and moist and mucous membranes are normal. No oropharyngeal exudate, posterior oropharyngeal edema or posterior oropharyngeal erythema.  Left nare mucosal swelling.   Neck: Trachea normal. Carotid bruit is not present. No Brudzinski's sign noted.  Cardiovascular: Normal rate, regular rhythm and normal heart sounds.  Exam reveals no gallop.   No murmur heard. Pulmonary/Chest: Effort normal. She has no decreased breath sounds. She has wheezes in the right upper field, the right lower field, the left middle field  and the left lower field. She has no rhonchi. She has no rales.  Abdominal: Soft. Normal appearance and bowel sounds are normal. There is no tenderness.  Lymphadenopathy:       Head (right side): No submental, no submandibular and no tonsillar adenopathy present.       Head (left side): No submental, no submandibular and no tonsillar adenopathy present.    She has no cervical adenopathy.  Neurological: She is alert and oriented to person, place, and time.  Skin: Skin is warm and dry. No rash noted.  Psychiatric: She has a normal mood and affect. Her speech is normal and behavior is normal.   [facility] reading (PRIMARY) by  Dr. Laney Pastor. Findings: Residual right upper lobe scar. No acute consolidation or effusion. Otherwise normal.   EKG read by Dr. Laney Pastor. Findings: Normal.   Peak flow predicted: 424 Peak flow pre neb: 293 Peak flow post neb: 323  Lung sounds post breathing tx: Normal. Mild wheeze RUL that resolved.  Assessment & Plan:   8 yof with pmh recurrent sinus infx, tb yrs ago, htn, and allergic rhinitis presents with cough, congestion, cp.   Chest pain, unspecified chest pain type - Plan: DG Chest 2 View, EKG 12-Lead --ekg normal, no hx exertional cp --doubt pe/dissection as episodes last for seconds then resolve, has been doing this for 3 days -- no assoc neuro changes, sob --likely due to asthma as she thinks she wheezes with these episodes --possibly secondary to gerd as below --ER if worsens, if exertional, or does not resolve after few secs as usual   Cough  Chronic sinusitis, unspecified location Wheezing - Plan: DG Chest 2 View, albuterol (PROVENTIL) (2.5 MG/3ML) 0.083% nebulizer solution 2.5 mg, ipratropium (ATROVENT) nebulizer solution 0.5 mg, albuterol (PROVENTIL HFA;VENTOLIN HFA) 108 (90 BASE) MCG/ACT inhaler, predniSONE (DELTASONE) 20 MG tablet --wheezing on exam resolved after duoneb --unchanged cxr, no acute etiology --most likely asthma --> no hx  asthma as child, could be secondary to chronic sinusitis, gerd --see Dr L 2 wks --albuterol prn for home --prednisone burst  Gastroesophageal reflux disease, esophagitis presence not specified --poorly controlled at this time as taking nexium 40 mg qd but still needing pepto prn --f/u with Dr L 2 wks  [name], PA-C Physician Assistant-Certified [facility]  02/26/2014 10:25 AM  I have participated in the care of this patient with the Advanced Practice Provider and agree with Diagnosis and Plan as documented. Robert P. Laney Pastor, M.D.

## 2014-02-26 NOTE — Patient Instructions (Signed)
Your chest xray looked good today other than the scarring in your right upper lung which is stable. Your ekg looked normal today. Your lung sounds were much better after the breathing treatment.  Please use the albuterol as needed at home. Please take the prednisone for 5 days. Please follow up with Dr L in 2 weeks for further management of possible asthma, possible pulmonary function testing.  If your chest pain worsens go to the ED asap! If your breathing, cough, or shortness of breath aren't better in a few days please return to care.

## 2014-02-26 NOTE — Telephone Encounter (Signed)
Spoke with pt, she is here to be seen. She will mention this to Dr. Joseph Art.

## 2014-03-11 ENCOUNTER — Telehealth: Payer: Self-pay

## 2014-03-11 DIAGNOSIS — J0101 Acute recurrent maxillary sinusitis: Secondary | ICD-10-CM

## 2014-03-11 NOTE — Telephone Encounter (Signed)
Patient's sinus medication is causing a lot of drainage and stomach aches. Patient would like an antibiotic sent to [facility]. Patient phone [phone]

## 2014-03-11 NOTE — Telephone Encounter (Signed)
Cough  Chronic sinusitis, unspecified location Wheezing - Plan: DG Chest 2 View, albuterol (PROVENTIL) (2.5 MG/3ML) 0.083% nebulizer solution 2.5 mg, ipratropium (ATROVENT) nebulizer solution 0.5 mg, albuterol (PROVENTIL HFA;VENTOLIN HFA) 108 (90 BASE) MCG/ACT inhaler, predniSONE (DELTASONE) 20 MG tablet --wheezing on exam resolved after duoneb --unchanged cxr, no acute etiology --most likely asthma --> no hx asthma as child, could be secondary to chronic sinusitis, gerd --see Dr L 2 wks --albuterol prn for home --prednisone burst   Todd please advise.

## 2014-03-12 MED ORDER — AMOXICILLIN-POT CLAVULANATE 875-125 MG PO TABS: 1.0000 | ORAL_TABLET | Freq: Two times a day (BID) | ORAL | Status: DC

## 2014-03-12 NOTE — Telephone Encounter (Signed)
Spoke with Dr. Carlean Jews. In clinic today. Augmentin sent in today for the pts sinus sx. As per my note from 2 weeks ago, he would like her to follow up with him this weekend or asap to work up further why she is having these recurrent issues.

## 2014-03-16 ENCOUNTER — Ambulatory Visit (INDEPENDENT_AMBULATORY_CARE_PROVIDER_SITE_OTHER): Payer: 59 | Admitting: Emergency Medicine

## 2014-03-16 VITALS — BP 128/80 | HR 79 | Temp 98.2°F | Resp 16 | Ht 66.0 in | Wt 225.0 lb

## 2014-03-16 DIAGNOSIS — Z780 Asymptomatic menopausal state: Secondary | ICD-10-CM | POA: Diagnosis not present

## 2014-03-16 DIAGNOSIS — G4700 Insomnia, unspecified: Secondary | ICD-10-CM | POA: Diagnosis not present

## 2014-03-16 DIAGNOSIS — Z79899 Other long term (current) drug therapy: Secondary | ICD-10-CM | POA: Diagnosis not present

## 2014-03-16 DIAGNOSIS — N951 Menopausal and female climacteric states: Secondary | ICD-10-CM | POA: Diagnosis not present

## 2014-03-16 LAB — POCT URINE PREGNANCY: PREG TEST UR: NEGATIVE

## 2014-03-16 MED ORDER — ESTRADIOL 0.05 MG/24HR TD PTTW: 1.0000 | MEDICATED_PATCH | TRANSDERMAL | Status: DC

## 2014-03-16 MED ORDER — LORAZEPAM 0.5 MG PO TABS
0.5000 mg | ORAL_TABLET | Freq: Every evening | ORAL | Status: DC | PRN
Start: 2014-03-16 — End: 2014-06-01

## 2014-03-16 NOTE — Progress Notes (Signed)
[facility] [address] [place] [address] [phone]  Date:  03/16/2014   Name:  Brianna Hawkins   DOB:  [birth year]   MRN:  [id number]  PCP:  [name], MD    Chief Complaint: other   History of Present Illness:  Brianna Hawkins is a 54 y.o. very pleasant female patient who presents with the following:  Some confusion on part of patient it seems According to Dr Lenn Cal note, he wanted a pregnancy test PRIOR to starting her on estrogen replacement for menopause She says the medication he prescribed is not effective in eradicating her hot flashes. Chelle's note clarified the matter She is continuing to have soaking sweats. Denies other complaint or health concern today.   Patient Active Problem List   Diagnosis Date Noted  . Chronic sinusitis 07/06/2013  . Palpitations 02/18/2012  . BMI 34.0-34.9,adult 08/02/2011  . Chest pain 04/21/2011  . Abnormal CXR 04/21/2011  . Dyspnea 04/21/2011  . Hypertension 02/08/2011  . Vertigo, benign positional 02/08/2011  . Insomnia 02/08/2011  . Headache 02/07/2011    Past Medical History  Diagnosis Date  . Urinary urgency   . Pain, joint, shoulder region, right   . Headache(784.0)   . Hypertension   . Allergy   . Blood transfusion   . GERD (gastroesophageal reflux disease)   . Tuberculosis     7 YEARS AGO  . Blood transfusion without reported diagnosis   . Arthritis   . Substance abuse   . Heart murmur     very mild-echo 1/15-normal    Past Surgical History  Procedure Laterality Date  . Tubal ligation    . Breast lumpectomy  2012    Underarm-rt-extra tissue  . Colonoscopy    . Upper gi endoscopy    . Ethmoidectomy Left 07/10/2013    Procedure: ETHMOIDECTOMY LEFT,MAXILLARY OSTIAL ENLARGEMENT WITH REMOVAL OF DISEASE;  Surgeon: Rozetta Nunnery, MD;  Location: [facility];  Service: ENT;  Laterality: Left;  . Turbinate reduction Left 07/10/2013    Procedure: LEFT TURBINATE  REDUCTION;  Surgeon: Rozetta Nunnery, MD;  Location: [facility];  Service: ENT;  Laterality: Left;  . Nose surgery      History  Substance Use Topics  . Smoking status: Former Smoker -- 0.30 packs/day for 15 years    Types: Cigarettes    Quit date: 01/01/2001  . Smokeless tobacco: Never Used     Comment: "never inhaled"  . Alcohol Use: No    Family History  Problem Relation Age of Onset  . Throat cancer Father     was a smoker  . Colon cancer Father   . Dementia Other   . HIV Sister   . Stroke Mother   . Diabetes Paternal Grandmother   . Diabetes Maternal Grandmother     Allergies  Allergen Reactions  . Banana Itching    Of throat  . Tomato Rash    Medication list has been reviewed and updated.  Current Outpatient Prescriptions on File Prior to Visit  Medication Sig Dispense Refill  . albuterol (PROVENTIL HFA;VENTOLIN HFA) 108 (90 BASE) MCG/ACT inhaler Inhale 2 puffs into the lungs every 4 (four) hours as needed for wheezing or shortness of breath (cough, shortness of breath or wheezing.). 1 Inhaler 1  . amLODipine (NORVASC) 10 MG tablet TAKE 1 TABLET BY MOUTH ONCE DAILY.  "NEEDS OFFICE VISIT FOR ADDITIONAL REFILLS" 30 tablet 0  . amoxicillin-clavulanate (AUGMENTIN) 875-125 MG  per tablet Take 1 tablet by mouth 2 (two) times daily. 20 tablet 0  . aspirin-acetaminophen-caffeine (EXCEDRIN MIGRAINE) [phone] MG per tablet Take 1 tablet by mouth every 6 (six) hours as needed for headache.    . Cholecalciferol (VITAMIN D PO) Take 1 tablet by mouth daily.    Marland Kitchen esomeprazole (NEXIUM) 40 MG capsule Take 1 capsule (40 mg total) by mouth daily before breakfast. 30 capsule 5  . estradiol (VIVELLE-DOT) 0.025 MG/24HR Place 1 patch onto the skin 2 (two) times a week. 8 patch 12  . FERROUS SULFATE PO Take 1 tablet by mouth daily.    . fluticasone (FLONASE) 50 MCG/ACT nasal spray Place 2 sprays into both nostrils daily. 16 g 1  . hydrochlorothiazide (MICROZIDE) 12.5  MG capsule Take 1 capsule (12.5 mg total) by mouth daily. 90 capsule 3  . ipratropium (ATROVENT) 0.03 % nasal spray Place 2 sprays into both nostrils every 12 (twelve) hours. 30 mL 12  . LORazepam (ATIVAN) 0.5 MG tablet Take 1 tablet (0.5 mg total) by mouth at bedtime as needed for anxiety. 30 tablet 1  . meclizine (ANTIVERT) 25 MG tablet Take 25 mg by mouth 3 (three) times daily as needed for dizziness.    . methocarbamol (ROBAXIN) 500 MG tablet Take 500 mg by mouth 2 (two) times daily as needed for muscle spasms.    . Multiple Vitamins-Minerals (HAIR/SKIN/NAILS) TABS Take 1 tablet by mouth daily.    . naproxen (NAPROSYN) 500 MG tablet Take 1 tablet (500 mg total) by mouth 2 (two) times daily. 15 tablet 0  . predniSONE (DELTASONE) 20 MG tablet Take 2 tablets (40 mg total) by mouth daily with breakfast. 10 tablet 0  . progesterone (PROMETRIUM) 100 MG capsule Take 1 capsule (100 mg total) by mouth daily. 30 capsule 5  . SUMAtriptan (IMITREX) 100 MG tablet Take 1 tablet (100 mg total) by mouth every 2 (two) hours as needed for migraine. 10 tablet 0   No current facility-administered medications on file prior to visit.    Review of Systems:  As per HPI, otherwise negative.    Physical Examination: Filed Vitals:   03/16/14 1750  BP: 128/80  Pulse: 79  Temp: 98.2 F (36.8 C)  Resp: 16   Filed Vitals:   03/16/14 1750  Height: 5\' 6"  (1.676 m)  Weight: 225 lb (102.059 kg)   Body mass index is 36.33 kg/(m^2). Ideal Body Weight: Weight in (lb) to have BMI = 25: 154.6   GEN: WDWN, NAD, Non-toxic, Alert & Oriented x 3 HEENT: Atraumatic, Normocephalic.  Ears and Nose: No external deformity. EXTR: No clubbing/cyanosis/edema NEURO: Normal gait.  PSYCH: Normally interactive. Conversant. Not depressed or anxious appearing.  Calm demeanor.    Assessment and Plan: Menopause Increase estrogen patch Follow up with Dr Carlean Jews  Signed,  Ellison Carwin, MD   Results for orders placed or  performed in visit on 03/16/14  POCT urine pregnancy  Result Value Ref Range   Preg Test, Ur Negative

## 2014-03-16 NOTE — Patient Instructions (Signed)
Menopause Menopause is the normal time of life when menstrual periods stop completely. Menopause is complete when you have missed 12 consecutive menstrual periods. It usually occurs between the ages of 48 years and 55 years. Very rarely does a woman develop menopause before the age of 40 years. At menopause, your ovaries stop producing the female hormones estrogen and progesterone. This can cause undesirable symptoms and also affect your health. Sometimes the symptoms may occur 4-5 years before the menopause begins. There is no relationship between menopause and:  Oral contraceptives.  Number of children you had.  Race.  The age your menstrual periods started (menarche). Heavy smokers and very thin women may develop menopause earlier in life. CAUSES  The ovaries stop producing the female hormones estrogen and progesterone.  Other causes include:  Surgery to remove both ovaries.  The ovaries stop functioning for no known reason.  Tumors of the pituitary gland in the brain.  Medical disease that affects the ovaries and hormone production.  Radiation treatment to the abdomen or pelvis.  Chemotherapy that affects the ovaries. SYMPTOMS   Hot flashes.  Night sweats.  Decrease in sex drive.  Vaginal dryness and thinning of the vagina causing painful intercourse.  Dryness of the skin and developing wrinkles.  Headaches.  Tiredness.  Irritability.  Memory problems.  Weight gain.  Bladder infections.  Hair growth of the face and chest.  Infertility. More serious symptoms include:  Loss of bone (osteoporosis) causing breaks (fractures).  Depression.  Hardening and narrowing of the arteries (atherosclerosis) causing heart attacks and strokes. DIAGNOSIS   When the menstrual periods have stopped for 12 straight months.  Physical exam.  Hormone studies of the blood. TREATMENT  There are many treatment choices and nearly as many questions about them. The  decisions to treat or not to treat menopausal changes is an individual choice made with your health care provider. Your health care provider can discuss the treatments with you. Together, you can decide which treatment will work best for you. Your treatment choices may include:   Hormone therapy (estrogen and progesterone).  Non-hormonal medicines.  Treating the individual symptoms with medicine (for example antidepressants for depression).  Herbal medicines that may help specific symptoms.  Counseling by a psychiatrist or psychologist.  Group therapy.  Lifestyle changes including:  Eating healthy.  Regular exercise.  Limiting caffeine and alcohol.  Stress management and meditation.  No treatment. HOME CARE INSTRUCTIONS   Take the medicine your health care provider gives you as directed.  Get plenty of sleep and rest.  Exercise regularly.  Eat a diet that contains calcium (good for the bones) and soy products (acts like estrogen hormone).  Avoid alcoholic beverages.  Do not smoke.  If you have hot flashes, dress in layers.  Take supplements, calcium, and vitamin D to strengthen bones.  You can use over-the-counter lubricants or moisturizers for vaginal dryness.  Group therapy is sometimes very helpful.  Acupuncture may be helpful in some cases. SEEK MEDICAL CARE IF:   You are not sure you are in menopause.  You are having menopausal symptoms and need advice and treatment.  You are still having menstrual periods after age 55 years.  You have pain with intercourse.  Menopause is complete (no menstrual period for 12 months) and you develop vaginal bleeding.  You need a referral to a specialist (gynecologist, psychiatrist, or psychologist) for treatment. SEEK IMMEDIATE MEDICAL CARE IF:   You have severe depression.  You have excessive vaginal bleeding.    You fell and think you have a broken bone.  You have pain when you urinate.  You develop leg or  chest pain.  You have a fast pounding heart beat (palpitations).  You have severe headaches.  You develop vision problems.  You feel a lump in your breast.  You have abdominal pain or severe indigestion. Document Released: 03/10/2003 Document Revised: 08/20/2012 Document Reviewed: 07/17/2012 Exit[employer] Patient Information 2015 [employer]. This information is not intended to replace advice given to you by your health care provider. Make sure you discuss any questions you have with your health care provider.  

## 2014-03-16 NOTE — Addendum Note (Signed)
Addended by: Roselee Culver on: 03/16/2014 07:13 PM   Modules accepted: Orders

## 2014-03-26 ENCOUNTER — Other Ambulatory Visit: Payer: Self-pay | Admitting: Physician Assistant

## 2014-05-03 ENCOUNTER — Ambulatory Visit (INDEPENDENT_AMBULATORY_CARE_PROVIDER_SITE_OTHER): Payer: 59 | Admitting: Physician Assistant

## 2014-05-03 VITALS — BP 114/62 | HR 74 | Temp 98.2°F | Resp 16 | Ht 66.0 in | Wt 225.0 lb

## 2014-05-03 DIAGNOSIS — M62830 Muscle spasm of back: Secondary | ICD-10-CM | POA: Diagnosis not present

## 2014-05-03 DIAGNOSIS — J0101 Acute recurrent maxillary sinusitis: Secondary | ICD-10-CM | POA: Diagnosis not present

## 2014-05-03 DIAGNOSIS — R0981 Nasal congestion: Secondary | ICD-10-CM

## 2014-05-03 DIAGNOSIS — G44219 Episodic tension-type headache, not intractable: Secondary | ICD-10-CM

## 2014-05-03 DIAGNOSIS — H538 Other visual disturbances: Secondary | ICD-10-CM | POA: Diagnosis not present

## 2014-05-03 MED ORDER — AMOXICILLIN-POT CLAVULANATE 875-125 MG PO TABS: 1.0000 | ORAL_TABLET | Freq: Two times a day (BID) | ORAL | Status: DC

## 2014-05-03 MED ORDER — CYCLOBENZAPRINE HCL 5 MG PO TABS: 5.0000 mg | ORAL_TABLET | Freq: Three times a day (TID) | ORAL | Status: DC | PRN

## 2014-05-03 NOTE — Patient Instructions (Signed)
Your congestion is most likely from a bacterial infection at this time. Please take the augmentin twice daily for 10 days. I see you had sinus surgery last year, you should contact your surgeon/ENT for follow up as you continue to have sinus infections since your surgery. I think you may have some allergies, using flonase daily and taking an antihistamine like claritin daily will help. You have a spasm in your back, applying heat, getting light massage, and taking the flexeril every 8 hours as needed should help with this.  Remember the flexeril can make you sleepy. If you're not feeling better in 4-5 please come back to see [place].

## 2014-05-03 NOTE — Progress Notes (Signed)
Subjective:    Patient ID: Brianna Hawkins, female    DOB: 03/14/1960, 54 y.o.   MRN: [id number]  Chief Complaint  Patient presents with  . Headache    x 1 day  . Blurred Vision  . Back Pain    lower  . Nasal Congestion    dark green   Patient Active Problem List   Diagnosis Date Noted  . Chronic sinusitis 07/06/2013  . Palpitations 02/18/2012  . BMI 34.0-34.9,adult 08/02/2011  . Chest pain 04/21/2011  . Abnormal CXR 04/21/2011  . Dyspnea 04/21/2011  . Hypertension 02/08/2011  . Vertigo, benign positional 02/08/2011  . Insomnia 02/08/2011  . Headache(784.0) 02/07/2011   Medications, allergies, past medical history, surgical history, family history, social history and problem list reviewed and updated.  HPI  77 yof with pmh recurrent sinus issues presents with ha, blurred vision, low back pain, and nasal congestion.   Sx started approx 8 days ago with head/sinus/nasal congestion. Persistent over this time. Right sided intermittent HA over right eye intermittent past week as well. Thinks its assoc with congestion. Rated 8/10 when comes on. Lasts for several hrs at a time. Occasionally with right sided HA she will have sharp shooting pain to her right neck. This lasts for secs at time. Occurs 1-2 times day. Has had dark green dc from nose past few days. Prod cough past few days. Mostly white. Mild st. Denies abd pain, n/v, diarrhea.   Has had assoc vision changes with the congestion past few days. Feels that both eyes have been mildly blurry past few days intermittent. Thinks she may have had double vision couple times couple days ago. No eye pain. No drainage or crusting. No photophobia. Has had these eye probs in past with her sinus probs, resolves when congestion resolves.   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 Diag with sinus infx, tx with  augmentin  Review of Systems See HPI. No fevers, chills.     Objective:   Physical Exam  Constitutional: She is oriented to person, place, and time. She appears well-developed and well-nourished.  Non-toxic appearance. She does not have a sickly appearance. She does not appear ill. No distress.  BP 114/62 mmHg  Pulse 74  Temp(Src) 98.2 F (36.8 C)  Resp 16  Ht 5\' 6"  (1.676 m)  Wt 225 lb (102.059 kg)  BMI 36.33 kg/m2  SpO2 98%   HENT:  Right Ear: A middle ear effusion is present.  Left Ear: Tympanic membrane normal.  Nose: Mucosal edema and rhinorrhea present. Right sinus exhibits no maxillary sinus tenderness and no frontal sinus tenderness. Left sinus exhibits maxillary sinus tenderness. Left sinus exhibits no frontal sinus tenderness.  Mouth/Throat: Uvula is midline, oropharynx is clear and moist and mucous membranes are normal.  Nasal polyps bilaterally.  Eyes: Conjunctivae and EOM are normal. Pupils are equal, round, and reactive to light. Right eye exhibits no discharge and no exudate. Left eye exhibits no discharge and no exudate.  Neck: No Brudzinski's sign noted.  Pulmonary/Chest: Effort normal. She has no decreased breath sounds. She has no wheezes. She has no rhonchi. She has no rales.  Musculoskeletal:       Back:  Palpable spasm over circled area. No overlying skin changes. TTP.   Lymphadenopathy:       Head (right side): No submental, no submandibular and no tonsillar adenopathy present.  Head (left side): No submental, no submandibular and no tonsillar adenopathy present.    She has cervical adenopathy.       Right cervical: Superficial cervical adenopathy present.       Left cervical: Superficial cervical adenopathy present.  Neurological: She is alert and oriented to person, place, and time. She has normal strength. No cranial nerve deficit or sensory deficit. She displays a negative Romberg sign.  Reflex Scores:      Patellar reflexes are 2+ on the right  side and 2+ on the left side. Normal finger to nose. Normal rapid alternating movements.   Psychiatric: She has a normal mood and affect. Her speech is normal and behavior is normal.      Assessment & Plan:   89 yof with pmh recurrent sinus issues presents with ha, blurred vision, low back pain, and nasal congestion.   Acute recurrent maxillary sinusitis - Plan: amoxicillin-clavulanate (AUGMENTIN) 875-125 MG per tablet Head congestion Episodic tension-type headache, not intractable Blurred vision, bilateral --Day 8 congestion sx, purulent nasal dc, ttp --> likely bacterial infx, augmentin --instructed to f/u with ent surgeon as this is likely 2nd sinus infx since sinus surg last year --flonase, claritin daily as likely contributing to ongoing congestion, continue mucinex-dm --ha likely do to sinuses, doubt sah or meningitis with no sudden onset, not worst of life, no nuchal rigidity --blurry vision intermittent, none currently, likely secondary to congestion as pt has gotten this with congestion in the past --> rtc if not improvement with above tx --normal neuro exam today  Muscle spasm of back - Plan: cyclobenzaprine (FLEXERIL) 5 MG tablet --heat, massage, light rom, flexeril tid prn --rtc if no relief  [name], PA-C Physician Assistant-Certified [facility]  05/03/2014 5:33 PM

## 2014-06-01 ENCOUNTER — Ambulatory Visit (INDEPENDENT_AMBULATORY_CARE_PROVIDER_SITE_OTHER): Payer: 59 | Admitting: Internal Medicine

## 2014-06-01 VITALS — BP 128/76 | HR 81 | Temp 98.2°F | Resp 18 | Ht 67.0 in | Wt 230.0 lb

## 2014-06-01 DIAGNOSIS — G4459 Other complicated headache syndrome: Secondary | ICD-10-CM

## 2014-06-01 DIAGNOSIS — R112 Nausea with vomiting, unspecified: Secondary | ICD-10-CM

## 2014-06-01 DIAGNOSIS — G4700 Insomnia, unspecified: Secondary | ICD-10-CM

## 2014-06-01 MED ORDER — LORAZEPAM 1 MG PO TABS: 1.0000 mg | ORAL_TABLET | Freq: Every day | ORAL | Status: DC

## 2014-06-01 MED ORDER — ONDANSETRON HCL 4 MG PO TABS: 4.0000 mg | ORAL_TABLET | Freq: Three times a day (TID) | ORAL | Status: DC | PRN

## 2014-06-01 MED ORDER — KETOROLAC TROMETHAMINE 60 MG/2ML IM SOLN
60.0000 mg | Freq: Once | INTRAMUSCULAR | Status: AC
  Administered 2014-06-01: 60 mg via INTRAMUSCULAR

## 2014-06-01 MED ORDER — ONDANSETRON 4 MG PO TBDP
8.0000 mg | ORAL_TABLET | Freq: Once | ORAL | Status: AC
  Administered 2014-06-01: 8 mg via ORAL

## 2014-06-01 MED ORDER — TRAMADOL HCL 50 MG PO TABS: 100.0000 mg | ORAL_TABLET | Freq: Two times a day (BID) | ORAL | Status: DC | PRN
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
## 2014-06-02 ENCOUNTER — Telehealth: Payer: Self-pay

## 2014-06-02 NOTE — Telephone Encounter (Signed)
Patient got the migraine shot yesterday and is calling because her headache is back. Please call and advise! [phone]

## 2014-06-03 NOTE — Telephone Encounter (Signed)
Are suggestions before I call pt?

## 2014-06-04 NOTE — Telephone Encounter (Signed)
Called pt, unable to leave message.

## 2014-06-04 NOTE — Telephone Encounter (Signed)
This was Dr. Ninfa Meeker plan for the pt:  Plan--okay to use Zofran and tramadol at home over the next 36 hours for any headache recurrence but if not symptom-free by 48 hours she should return for follow-up  If she is not starting to improve, she should RTC.

## 2014-06-04 NOTE — Telephone Encounter (Signed)
Patient called back and I gave her the following message from Ezekiel Slocumb., PA-C:  This was Dr. Ninfa Meeker plan for the pt:  Plan--okay to use Zofran and tramadol at home over the next 36 hours for any headache recurrence but if not symptom-free by 48 hours she should return for follow-up  If she is not starting to improve, she should RTC.         Patient understood.

## 2014-06-21 ENCOUNTER — Telehealth: Payer: Self-pay | Admitting: Family Medicine

## 2014-06-21 NOTE — Telephone Encounter (Signed)
[employer] faxed FMLA forms on 06/19/2014 at 4:19pm. Called patient to get more information about her need for FMLA. No answer, left message on VM to return call. Blank copy of forms scanned under release ID # [id number]. Original copy of forms placed in bottom drawer at FMLA/Disabilities desk; filed behind the letter of patient's last name "J".

## 2014-06-22 NOTE — Telephone Encounter (Signed)
Patient returned call. She states that her FMLA is for a chronic condition. States that we can copy from previous FMLA paperwork. A copy from September 2015 is scanned in patient's chart under the media tab. Patient states that Dr. Joseph Art normally signs paperwork for her however he is out for the next month so another provider will have to do this.

## 2014-06-24 NOTE — Telephone Encounter (Signed)
Patient came to walk in center to check on the status of her paperwork. She states that it is due by 07/01/2014. I informed patient that the policy for completing and signing FMLA is 5-7 business days. We received the forms on Saturday 06/19/2014. The first business day started on 06/21/2014 [facility] department is closed on weekends) and we are currently at the fourth business day. Dr Joseph Art normally signs her papers however he is out of the office until July. I am placing patient's FMLA paperwork in the box at the nurse's station at 102. Forms have already been completed by my Delana Meyer) they just need to be signed. Please return to the FMLA/DISABILITIES department upon review and signature.   Thanks, [employer]

## 2014-06-25 ENCOUNTER — Telehealth: Payer: Self-pay | Admitting: Family Medicine

## 2014-06-25 NOTE — Telephone Encounter (Signed)
FMLA forms were in Dr. Pauletta Browns box in the provider lounge. Signed on his behalf. Returned to the FMLA/Disabilities tray at check out.

## 2014-06-25 NOTE — Telephone Encounter (Addendum)
Is there a status on obtaining a signature for patient's FMLA paperwork? Her forms have already been completed they just need to be signed by another provider since Dr. Joseph Art isn't here for a month. They are still currently in the box at the nurse's station at 102. It just needs to be reviewed and signed. Please reference previous phone messages for further information. Please return to FMLA/Disabilites tray at check out when this is done.  Thanks, [employer]

## 2014-06-28 NOTE — Telephone Encounter (Signed)
ppw faxed to [employer]/scanned/called and notified patient

## 2014-07-08 ENCOUNTER — Ambulatory Visit: Payer: 59

## 2014-07-08 ENCOUNTER — Ambulatory Visit (INDEPENDENT_AMBULATORY_CARE_PROVIDER_SITE_OTHER): Payer: 59 | Admitting: Family Medicine

## 2014-07-08 ENCOUNTER — Ambulatory Visit (INDEPENDENT_AMBULATORY_CARE_PROVIDER_SITE_OTHER): Payer: 59

## 2014-07-08 VITALS — BP 122/74 | HR 82 | Temp 98.3°F | Resp 17 | Ht 65.5 in | Wt 226.0 lb

## 2014-07-08 DIAGNOSIS — K5901 Slow transit constipation: Secondary | ICD-10-CM | POA: Diagnosis not present

## 2014-07-08 DIAGNOSIS — H9313 Tinnitus, bilateral: Secondary | ICD-10-CM | POA: Diagnosis not present

## 2014-07-08 DIAGNOSIS — M19019 Primary osteoarthritis, unspecified shoulder: Secondary | ICD-10-CM

## 2014-07-08 DIAGNOSIS — M25551 Pain in right hip: Secondary | ICD-10-CM | POA: Diagnosis not present

## 2014-07-08 DIAGNOSIS — R202 Paresthesia of skin: Secondary | ICD-10-CM | POA: Diagnosis not present

## 2014-07-08 DIAGNOSIS — M25511 Pain in right shoulder: Secondary | ICD-10-CM | POA: Diagnosis not present

## 2014-07-08 DIAGNOSIS — R079 Chest pain, unspecified: Secondary | ICD-10-CM

## 2014-07-08 DIAGNOSIS — R2240 Localized swelling, mass and lump, unspecified lower limb: Secondary | ICD-10-CM | POA: Diagnosis not present

## 2014-07-08 DIAGNOSIS — M129 Arthropathy, unspecified: Secondary | ICD-10-CM

## 2014-07-08 DIAGNOSIS — M1611 Unilateral primary osteoarthritis, right hip: Secondary | ICD-10-CM

## 2014-07-08 DIAGNOSIS — R1012 Left upper quadrant pain: Secondary | ICD-10-CM

## 2014-07-08 LAB — COMPREHENSIVE METABOLIC PANEL
ALBUMIN: 4.1 g/dL (ref 3.5–5.2)
ALT: 12 U/L (ref 0–35)
AST: 17 U/L (ref 0–37)
Alkaline Phosphatase: 85 U/L (ref 39–117)
BUN: 16 mg/dL (ref 6–23)
CALCIUM: 9.5 mg/dL (ref 8.4–10.5)
CHLORIDE: 106 meq/L (ref 96–112)
CO2: 28 meq/L (ref 19–32)
CREATININE: 0.61 mg/dL (ref 0.50–1.10)
Glucose, Bld: 88 mg/dL (ref 70–99)
Potassium: 4.4 mEq/L (ref 3.5–5.3)
Sodium: 141 mEq/L (ref 135–145)
Total Bilirubin: 0.4 mg/dL (ref 0.2–1.2)
Total Protein: 7.1 g/dL (ref 6.0–8.3)

## 2014-07-08 LAB — CBC
HCT: 34.5 % — ABNORMAL LOW (ref 36.0–46.0)
HEMOGLOBIN: 11.4 g/dL — AB (ref 12.0–15.0)
MCH: 29.5 pg (ref 26.0–34.0)
MCHC: 33 g/dL (ref 30.0–36.0)
MCV: 89.4 fL (ref 78.0–100.0)
MPV: 10.9 fL (ref 8.6–12.4)
Platelets: 280 10*3/uL (ref 150–400)
RBC: 3.86 MIL/uL — ABNORMAL LOW (ref 3.87–5.11)
RDW: 14 % (ref 11.5–15.5)
WBC: 5.3 10*3/uL (ref 4.0–10.5)

## 2014-07-08 MED ORDER — TRIAMCINOLONE ACETONIDE 40 MG/ML IJ SUSP
40.0000 mg | Freq: Once | INTRAMUSCULAR | Status: AC
  Administered 2014-07-08: 40 mg via INTRA_ARTICULAR

## 2014-07-08 MED ORDER — HYDROXYZINE HCL 25 MG PO TABS: ORAL_TABLET | ORAL | Status: DC

## 2014-07-08 MED ORDER — MELOXICAM 7.5 MG PO TABS: ORAL_TABLET | ORAL | Status: DC

## 2014-07-08 NOTE — Progress Notes (Signed)
Subjective:  Patient ID: Brianna Hawkins, female    DOB: 09-25-1960  Age: 54 y.o. MRN: [id number]  54 year old lady who is here with several things going on. A few days ago she had a numbness sensation in her left leg that concerned her. She told her grandkids to call for help if she fell to the floor. However along with that she has been having intermittent episodes of paresthesias in her legs with the sensation of something crawling on them. Knows of no specific injuries. She has noticed to just today a knot on her left lower leg about 4 inches above the ankle. She does not recall bumping her leg or injuring it in any fashion.  She's been having problems with her right shoulder hurting her. This given her some troubles for a long time off and on. It is hurting her more right now. It hurts with movement or there is activity, deep down in the joint.  She's been having problems with a sound of wind blowing in her ears. This happens more when she is up and stirring around. She is not really having pain with it. This is been having for a while also. It is not constant.  She has had some pain in her left chest below the left breast. It is not with any particular pattern to it.  When she stands long time she hurts in her right hip.      Objective:   Pleasant lady, alert and oriented, in no acute distress. Her TMs are normal. Eyes PERRLA. Fundi could not be well visualized. Throat was clear. Neck supple without nodes or thyromegaly. No carotid bruits. Chest is clear to all station. Heart regular without murmurs gallops or arrhythmias. Abdomen is soft with mild upper quadrant tenderness on the left. Extremities are without edema. Has a soft tissue nodule above the left ankle laterally about 4 inches from the ankle. This is about 3 or 4 cm in diameter, smooth, mildly tender. The right shoulder has good deal of pain in it against resistance of abduction. Motor strength antibodies symmetrical. Gait is  normal. Deep tender reflexes 2-3+ upper, 1+ lower.  [facility] reading (PRIMARY) by  Dr. Linna Darner Narrowing of joint space both hips Normal right shoulder Constipation  Procedure Note: Using sterile technique the right shoulder was injected with Kenalog 40 and 2% lidocaine 2 mL, using the posterior approach. Patient tolerated the procedure well. She was instructed in what to watch for, stretching of the shoulder, and what to expect.  Past, family, social reviewed.  Works Water engineer at [employer].  Constitutional: Unremarkable HEENT unremarkable Cardiovascular left chest pains as noted Respiratory: Unremarkable GI: Gets some dyspepsia. Spicy foods bother her. Bowels are fair. GU: Normal Muscular skeletal: As above Dermatologic: Unremarkable Neurologic: Unremarkable  emotional: Unremarkable Endocrine: Not diabetic       .    Assessment & Plan:   Assessment:  Right shoulder pain Right hip pain Paresthesias legs Left chest pain Dyspepsia Tinnitus Osteoarthritis  Plan: Keeps having tinnitus problems can refer to ENT If she keeps having shoulder problems can refer to or throat Keeps having hip problems can refer to or so If she keeps having paresthesia can refer to neurology Patient Instructions  Take MiraLAX daily until stools are on the loose side, then use as needed. I think that will help the chest discomfort on the left.  Take hydroxyzine one pill every 8-12 hours only when needed for symptoms of the crawling on the legs. If symptoms  continue to persist I will refer you to a neurologist. These are called paresthesias. They are often anxiety related.  Apply ice to the shoulder. 2 range of motion exercises directed  Take meloxicam one daily for hip. If you're having bad pain you can take twice daily. This should help the shoulder some also. If it upsets her stomach or cause for burning pain in the stomach discontinue use.     Monnie Anspach, MD 07/08/2014

## 2014-07-08 NOTE — Progress Notes (Deleted)
Chief Complaint:  Chief Complaint  Patient presents with  . Ear Pain    of unknown origin   . Leg Pain    of unknown origin   . Shoulder Pain    of unknown origin   . Hand Pain    of unknown origin     HPI: ARLETHIA BASSO is a 54 y.o. female who is here for  ***  Past Medical History  Diagnosis Date  . Urinary urgency   . Pain, joint, shoulder region, right   . Headache(784.0)   . Hypertension   . Allergy   . Blood transfusion   . GERD (gastroesophageal reflux disease)   . Tuberculosis     7 YEARS AGO  . Blood transfusion without reported diagnosis   . Arthritis   . Substance abuse   . Heart murmur     very mild-echo 1/15-normal   Past Surgical History  Procedure Laterality Date  . Tubal ligation    . Breast lumpectomy  2012    Underarm-rt-extra tissue  . Colonoscopy    . Upper gi endoscopy    . Ethmoidectomy Left 07/10/2013    Procedure: ETHMOIDECTOMY LEFT,MAXILLARY OSTIAL ENLARGEMENT WITH REMOVAL OF DISEASE;  Surgeon: Rozetta Nunnery, MD;  Location: [place];  Service: ENT;  Laterality: Left;  . Turbinate reduction Left 07/10/2013    Procedure: LEFT TURBINATE REDUCTION;  Surgeon: Rozetta Nunnery, MD;  Location: [place];  Service: ENT;  Laterality: Left;  . Nose surgery     History   Social History  . Marital Status: Single    Spouse Name: N/A  . Number of Children: 4  . Years of Education: N/A   Occupational History  . [employer]   Social History Main Topics  . Smoking status: Former Smoker -- 0.30 packs/day for 15 years    Types: Cigarettes    Quit date: 01/01/2001  . Smokeless tobacco: Never Used     Comment: "never inhaled"  . Alcohol Use: No  . Drug Use: No  . Sexual Activity: Yes   Other Topics Concern  . None   Social History Narrative   Single. Education: [employer].   Family History  Problem Relation Age of Onset  . Throat cancer Father     was a smoker  .  Colon cancer Father   . Dementia Other   . HIV Sister   . Stroke Mother   . Diabetes Paternal Grandmother   . Diabetes Maternal Grandmother    Allergies  Allergen Reactions  . Banana Itching    Of throat  . Tomato Rash   Prior to Admission medications   Medication Sig Start Date End Date Taking? Authorizing Provider  albuterol (PROVENTIL HFA;VENTOLIN HFA) 108 (90 BASE) MCG/ACT inhaler Inhale 2 puffs into the lungs every 4 (four) hours as needed for wheezing or shortness of breath (cough, shortness of breath or wheezing.). Patient not taking: Reported on 06/01/2014 02/26/14   Araceli Bouche, PA  amLODipine (NORVASC) 10 MG tablet TAKE 1 TABLET BY MOUTH ONCE DAILY **NEED OFFICE VISIT FOR REFILLS** 03/26/14   [name], MD  amoxicillin-clavulanate (AUGMENTIN) 875-125 MG per tablet Take 1 tablet by mouth 2 (two) times daily. Patient not taking: Reported on 06/01/2014 05/03/14   Araceli Bouche, PA  aspirin-acetaminophen-caffeine (EXCEDRIN MIGRAINE) [phone] MG per tablet Take 1 tablet by mouth every 6 (six) hours as needed for headache.    Historical Provider,  MD  Cholecalciferol (VITAMIN D PO) Take 1 tablet by mouth daily.    Historical Provider, MD  cyclobenzaprine (FLEXERIL) 5 MG tablet Take 1 tablet (5 mg total) by mouth 3 (three) times daily as needed for muscle spasms. Patient not taking: Reported on 06/01/2014 05/03/14   Araceli Bouche, PA  esomeprazole (NEXIUM) 40 MG capsule Take 1 capsule (40 mg total) by mouth daily before breakfast. 04/30/13   [name], MD  estradiol (VIVELLE-DOT) 0.025 MG/24HR Place 1 patch onto the skin 2 (two) times a week. 02/18/14   [name], MD  estradiol (VIVELLE-DOT) 0.05 MG/24HR patch Place 1 patch (0.05 mg total) onto the skin 2 (two) times a week. 03/16/14   Roselee Culver, MD  FERROUS SULFATE PO Take 1 tablet by mouth daily.    Historical Provider, MD  fluticasone (FLONASE) 50 MCG/ACT nasal spray Place 2 sprays into both nostrils daily. 04/02/13    [name], PA-C  hydrochlorothiazide (MICROZIDE) 12.5 MG capsule Take 1 capsule (12.5 mg total) by mouth daily. 06/17/13   [name], MD  LORazepam (ATIVAN) 1 MG tablet Take 1 tablet (1 mg total) by mouth at bedtime. 06/01/14   [name], MD  meclizine (ANTIVERT) 25 MG tablet Take 25 mg by mouth 3 (three) times daily as needed for dizziness.    Historical Provider, MD  Multiple Vitamins-Minerals (HAIR/SKIN/NAILS) TABS Take 1 tablet by mouth daily.    Historical Provider, MD  ondansetron (ZOFRAN) 4 MG tablet Take 1 tablet (4 mg total) by mouth every 8 (eight) hours as needed for nausea or vomiting. 06/01/14   [name], MD  progesterone (PROMETRIUM) 100 MG capsule Take 1 capsule (100 mg total) by mouth daily. 02/18/14   [name], MD  SUMAtriptan (IMITREX) 100 MG tablet Take 1 tablet (100 mg total) by mouth every 2 (two) hours as needed for migraine. 10/03/12   [name], MD  traMADol (ULTRAM) 50 MG tablet Take 2 tablets (100 mg total) by mouth every 12 (twelve) hours as needed. 06/01/14   [name], MD     ROS: The patient denies fevers, chills, night sweats, unintentional weight loss, chest pain, palpitations, wheezing, dyspnea on exertion, nausea, vomiting, abdominal pain, dysuria, hematuria, melena, numbness, weakness, or tingling. ***  All other systems have been reviewed and were otherwise negative with the exception of those mentioned in the HPI and as above.    PHYSICAL EXAM: Filed Vitals:   07/08/14 1339  BP: 122/74  Pulse: 82  Temp: 98.3 F (36.8 C)  Resp: 17   Filed Vitals:   07/08/14 1339  Height: 5' 5.5" (1.664 m)  Weight: 226 lb (102.513 kg)   Body mass index is 37.02 kg/(m^2).   General: Alert, no acute distress HEENT:  Normocephalic, atraumatic, oropharynx patent. EOMI, PERRLA Cardiovascular:  Regular rate and rhythm, no rubs murmurs or gallops.  No Carotid bruits, radial pulse intact. No pedal edema.  Respiratory: Clear to  auscultation bilaterally.  No wheezes, rales, or rhonchi.  No cyanosis, no use of accessory musculature GI: No organomegaly, abdomen is soft and non-tender, positive bowel sounds.  No masses. Skin: No rashes. Neurologic: Facial musculature symmetric. Psychiatric: Patient is appropriate throughout our interaction. Lymphatic: No cervical lymphadenopathy Musculoskeletal: Gait intact.   LABS: Results for orders placed or performed in visit on 03/16/14  POCT urine pregnancy  Result Value Ref Range   Preg Test, Ur Negative      EKG/XRAY:   Primary read interpreted by Dr.  Leeya Rusconi at [facility].   ASSESSMENT/PLAN: No diagnosis found.   Gross sideeffects, risk and benefits, and alternatives of medications d/w patient. Patient is aware that all medications have potential sideeffects and we are unable to predict every sideeffect or drug-drug interaction that may occur.  Arlyss Queen MD 07/08/2014 1:41 PM

## 2014-07-08 NOTE — Progress Notes (Deleted)
Chief Complaint: No chief complaint on file.   HPI: Brianna Hawkins is a 54 y.o. female who is here for  ***  Past Medical History  Diagnosis Date  . Urinary urgency   . Pain, joint, shoulder region, right   . Headache(784.0)   . Hypertension   . Allergy   . Blood transfusion   . GERD (gastroesophageal reflux disease)   . Tuberculosis     7 YEARS AGO  . Blood transfusion without reported diagnosis   . Arthritis   . Substance abuse   . Heart murmur     very mild-echo 1/15-normal   Past Surgical History  Procedure Laterality Date  . Tubal ligation    . Breast lumpectomy  2012    Underarm-rt-extra tissue  . Colonoscopy    . Upper gi endoscopy    . Ethmoidectomy Left 07/10/2013    Procedure: ETHMOIDECTOMY LEFT,MAXILLARY OSTIAL ENLARGEMENT WITH REMOVAL OF DISEASE;  Surgeon: Rozetta Nunnery, MD;  Location: [place];  Service: ENT;  Laterality: Left;  . Turbinate reduction Left 07/10/2013    Procedure: LEFT TURBINATE REDUCTION;  Surgeon: Rozetta Nunnery, MD;  Location: [place];  Service: ENT;  Laterality: Left;  . Nose surgery     History   Social History  . Marital Status: Single    Spouse Name: N/A  . Number of Children: 4  . Years of Education: N/A   Occupational History  . [employer]   Social History Main Topics  . Smoking status: Former Smoker -- 0.30 packs/day for 15 years    Types: Cigarettes    Quit date: 01/01/2001  . Smokeless tobacco: Never Used     Comment: "never inhaled"  . Alcohol Use: No  . Drug Use: No  . Sexual Activity: Yes   Other Topics Concern  . Not on file   Social History Narrative   Single. Education: [employer].   Family History  Problem Relation Age of Onset  . Throat cancer Father     was a smoker  . Colon cancer Father   . Dementia Other   . HIV Sister   . Stroke Mother   . Diabetes Paternal Grandmother   . Diabetes Maternal Grandmother    Allergies    Allergen Reactions  . Banana Itching    Of throat  . Tomato Rash   Prior to Admission medications   Medication Sig Start Date End Date Taking? Authorizing Provider  albuterol (PROVENTIL HFA;VENTOLIN HFA) 108 (90 BASE) MCG/ACT inhaler Inhale 2 puffs into the lungs every 4 (four) hours as needed for wheezing or shortness of breath (cough, shortness of breath or wheezing.). Patient not taking: Reported on 06/01/2014 02/26/14   Araceli Bouche, PA  amLODipine (NORVASC) 10 MG tablet TAKE 1 TABLET BY MOUTH ONCE DAILY **NEED OFFICE VISIT FOR REFILLS** 03/26/14   [name], MD  amoxicillin-clavulanate (AUGMENTIN) 875-125 MG per tablet Take 1 tablet by mouth 2 (two) times daily. Patient not taking: Reported on 06/01/2014 05/03/14   Araceli Bouche, PA  aspirin-acetaminophen-caffeine (EXCEDRIN MIGRAINE) [phone] MG per tablet Take 1 tablet by mouth every 6 (six) hours as needed for headache.    Historical Provider, MD  Cholecalciferol (VITAMIN D PO) Take 1 tablet by mouth daily.    Historical Provider, MD  cyclobenzaprine (FLEXERIL) 5 MG tablet Take 1 tablet (5 mg total) by mouth 3 (three) times daily as needed for muscle spasms. Patient not taking:  Reported on 06/01/2014 05/03/14   Araceli Bouche, PA  esomeprazole (NEXIUM) 40 MG capsule Take 1 capsule (40 mg total) by mouth daily before breakfast. 04/30/13   [name], MD  estradiol (VIVELLE-DOT) 0.025 MG/24HR Place 1 patch onto the skin 2 (two) times a week. 02/18/14   [name], MD  estradiol (VIVELLE-DOT) 0.05 MG/24HR patch Place 1 patch (0.05 mg total) onto the skin 2 (two) times a week. 03/16/14   Roselee Culver, MD  FERROUS SULFATE PO Take 1 tablet by mouth daily.    Historical Provider, MD  fluticasone (FLONASE) 50 MCG/ACT nasal spray Place 2 sprays into both nostrils daily. 04/02/13   [name], PA-C  hydrochlorothiazide (MICROZIDE) 12.5 MG capsule Take 1 capsule (12.5 mg total) by mouth daily. 06/17/13   [name], MD  LORazepam  (ATIVAN) 1 MG tablet Take 1 tablet (1 mg total) by mouth at bedtime. 06/01/14   [name], MD  meclizine (ANTIVERT) 25 MG tablet Take 25 mg by mouth 3 (three) times daily as needed for dizziness.    Historical Provider, MD  Multiple Vitamins-Minerals (HAIR/SKIN/NAILS) TABS Take 1 tablet by mouth daily.    Historical Provider, MD  ondansetron (ZOFRAN) 4 MG tablet Take 1 tablet (4 mg total) by mouth every 8 (eight) hours as needed for nausea or vomiting. 06/01/14   [name], MD  progesterone (PROMETRIUM) 100 MG capsule Take 1 capsule (100 mg total) by mouth daily. 02/18/14   [name], MD  SUMAtriptan (IMITREX) 100 MG tablet Take 1 tablet (100 mg total) by mouth every 2 (two) hours as needed for migraine. 10/03/12   [name], MD  traMADol (ULTRAM) 50 MG tablet Take 2 tablets (100 mg total) by mouth every 12 (twelve) hours as needed. 06/01/14   [name], MD     ROS: The patient denies fevers, chills, night sweats, unintentional weight loss, chest pain, palpitations, wheezing, dyspnea on exertion, nausea, vomiting, abdominal pain, dysuria, hematuria, melena, numbness, weakness, or tingling. ***  All other systems have been reviewed and were otherwise negative with the exception of those mentioned in the HPI and as above.    PHYSICAL EXAM: There were no vitals filed for this visit. There were no vitals filed for this visit. There is no weight on file to calculate BMI.   General: Alert, no acute distress HEENT:  Normocephalic, atraumatic, oropharynx patent. EOMI, PERRLA Cardiovascular:  Regular rate and rhythm, no rubs murmurs or gallops.  No Carotid bruits, radial pulse intact. No pedal edema.  Respiratory: Clear to auscultation bilaterally.  No wheezes, rales, or rhonchi.  No cyanosis, no use of accessory musculature GI: No organomegaly, abdomen is soft and non-tender, positive bowel sounds.  No masses. Skin: No rashes. Neurologic: Facial musculature  symmetric. Psychiatric: Patient is appropriate throughout our interaction. Lymphatic: No cervical lymphadenopathy Musculoskeletal: Gait intact.   LABS: Results for orders placed or performed in visit on 03/16/14  POCT urine pregnancy  Result Value Ref Range   Preg Test, Ur Negative      EKG/XRAY:   Primary read interpreted by Dr. Everlene Farrier at [facility].   ASSESSMENT/PLAN: No diagnosis found.   Gross sideeffects, risk and benefits, and alternatives of medications d/w patient. Patient is aware that all medications have potential sideeffects and we are unable to predict every sideeffect or drug-drug interaction that may occur.  Arlyss Queen MD 07/08/2014 1:37 PM

## 2014-07-08 NOTE — Patient Instructions (Addendum)
Take MiraLAX daily until stools are on the loose side, then use as needed. I think that will help the chest discomfort on the left.  Take hydroxyzine one pill every 8-12 hours only when needed for symptoms of the crawling on the legs. If symptoms continue to persist I will refer you to a neurologist. These are called paresthesias. They are often anxiety related.  Apply ice to the shoulder. 2 range of motion exercises directed  Take meloxicam one daily for hip. If you're having bad pain you can take twice daily. This should help the shoulder some also. If it upsets her stomach or cause for burning pain in the stomach discontinue use.

## 2014-07-09 ENCOUNTER — Encounter: Payer: Self-pay | Admitting: Family Medicine

## 2014-07-13 ENCOUNTER — Telehealth: Payer: Self-pay

## 2014-07-13 NOTE — Telephone Encounter (Signed)
PATIENT STATES SHE CAME INTO THE OFFICE TO SEE DR. HOPPER THURS. FOR ABDOMINAL PAIN. SHE IS STILL HAVING PAIN IN HER (L) SIDE. SHE HAS TRIED TO TAKE DUCALAX AND SHE WAS ABLE TO HAVE 1 BOWEL MOVEMENT, BUT THE PAIN IS REALLY BAD. SHE FEELS LIKE SHE NEEDS TO BE "FLUSHED OUT." SHE SAID "IT FEELS LIKE SOMETHING IS STUCK." SHE WOULD LIKE DR. HOPPER TO CALL HER IN SOMETHING TO HER PHARMACY. BEST PHONE [phone] (CELL)  PHARMACY CHOICE IS [facility].  [place]

## 2014-07-14 NOTE — Telephone Encounter (Signed)
Any suggestion on what can be called in? I spoke with pt and advised her to come in if she is not feeling better. She states she feels better today.

## 2014-08-04 ENCOUNTER — Telehealth: Payer: Self-pay

## 2014-08-04 NOTE — Telephone Encounter (Signed)
Paperwork for FMLA was faxed back from an [employer] from [place] stating the paperwork was not completed by Dr. Joseph Art. I have highlighted the question that needs to be answers please fill this out and return to the [facility] box at checkout at 102. Placing in [facility] box on 08/04/14 since Dr. Carlean Jews is out until 08/24/14.

## 2014-08-10 NOTE — Telephone Encounter (Signed)
The form is not in my box. Please let me know if you still need me to do this.

## 2014-08-12 NOTE — Telephone Encounter (Signed)
Paperwork had been placed back in Dr.L's box, I have moved it back to Mani's box on 08/12/14 so please see if you can fill it out for this patient thank you.

## 2014-08-12 NOTE — Telephone Encounter (Signed)
Called [place] requesting the FMLA. This was not supposed to go to Dr. Joseph Art. Per Doroteo Bradford she is approved through 12/2014. No additional FMLA is needed.

## 2014-08-31 ENCOUNTER — Ambulatory Visit (INDEPENDENT_AMBULATORY_CARE_PROVIDER_SITE_OTHER): Payer: 59 | Admitting: Family Medicine

## 2014-08-31 VITALS — BP 108/80 | HR 86 | Temp 97.7°F | Resp 18 | Ht 66.0 in | Wt 225.5 lb

## 2014-08-31 DIAGNOSIS — G479 Sleep disorder, unspecified: Secondary | ICD-10-CM | POA: Diagnosis not present

## 2014-08-31 DIAGNOSIS — D649 Anemia, unspecified: Secondary | ICD-10-CM | POA: Diagnosis not present

## 2014-08-31 DIAGNOSIS — R42 Dizziness and giddiness: Secondary | ICD-10-CM

## 2014-08-31 DIAGNOSIS — R079 Chest pain, unspecified: Secondary | ICD-10-CM

## 2014-08-31 DIAGNOSIS — R5383 Other fatigue: Secondary | ICD-10-CM | POA: Diagnosis not present

## 2014-08-31 LAB — POCT URINALYSIS DIPSTICK
Bilirubin, UA: NEGATIVE
GLUCOSE UA: NEGATIVE
Ketones, UA: NEGATIVE
LEUKOCYTES UA: NEGATIVE
Nitrite, UA: NEGATIVE
PROTEIN UA: NEGATIVE
UROBILINOGEN UA: 0.2
pH, UA: 5

## 2014-08-31 LAB — POCT UA - MICROSCOPIC ONLY
Casts, Ur, LPF, POC: NEGATIVE
Crystals, Ur, HPF, POC: NEGATIVE
MUCUS UA: NEGATIVE
Yeast, UA: NEGATIVE

## 2014-08-31 LAB — POCT CBC
GRANULOCYTE PERCENT: 49.1 % (ref 37–80)
HEMATOCRIT: 36.8 % — AB (ref 37.7–47.9)
HEMOGLOBIN: 11.9 g/dL — AB (ref 12.2–16.2)
Lymph, poc: 2.5 (ref 0.6–3.4)
MCH, POC: 28.8 pg (ref 27–31.2)
MCHC: 32.3 g/dL (ref 31.8–35.4)
MCV: 89.2 fL (ref 80–97)
MID (cbc): 0.3 (ref 0–0.9)
MPV: 7.8 fL (ref 0–99.8)
POC Granulocyte: 2.7 (ref 2–6.9)
POC LYMPH PERCENT: 44.7 %L (ref 10–50)
POC MID %: 6.2 %M (ref 0–12)
Platelet Count, POC: 245 10*3/uL (ref 142–424)
RBC: 4.12 M/uL (ref 4.04–5.48)
RDW, POC: 13.8 %
WBC: 5.6 10*3/uL (ref 4.6–10.2)

## 2014-08-31 LAB — POCT GLYCOSYLATED HEMOGLOBIN (HGB A1C): Hemoglobin A1C: 5.9

## 2014-08-31 MED ORDER — TRAZODONE HCL 50 MG PO TABS: 25.0000 mg | ORAL_TABLET | Freq: Every evening | ORAL | Status: DC | PRN

## 2014-08-31 MED ORDER — ZOLPIDEM TARTRATE 5 MG PO TABS: ORAL_TABLET | ORAL | Status: DC

## 2014-08-31 NOTE — Progress Notes (Signed)
Dizziness and fatigue Subjective:  Patient ID: Brianna Hawkins, female    DOB: [birth year]  Age: 54 y.o. MRN: [id number]  54 year old lady who is here complaining of dizziness and fatigue. She says that she goes to bed and lays awake most of the night, not going to sleep soundly until early morning. Then her alarm wakes her up in a few hours. She goes to bed at 10 PM and doesn't have to be up until about 8 AM. She is a double coffee at that point. She has dizziness and weakness in the day. She just stays fatigue. She's been having some chest pains at times, somewhat nonspecific. She gets short of breath. She does not smoke.  On quizzing her further at the end of the exam I asked if there is anything else. 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   Objective:   Pleasant lady in no acute distress. TMs normal. Eyes PERRLA. Throat clear. Neck supple without nodes or thyromegaly. Chest clear. Heart regular without murmurs. Abdomen soft and nontender. She has trace edema. Results for orders placed or performed in visit on 08/31/14  POCT glycosylated hemoglobin (Hb A1C)  Result Value Ref Range   Hemoglobin A1C 5.9   POCT CBC  Result Value Ref Range   WBC 5.6 4.6 - 10.2 K/uL   Lymph, poc 2.5 0.6 - 3.4   POC LYMPH PERCENT 44.7 10 - 50 %L   MID (cbc) 0.3 0 - 0.9   POC MID % 6.2 0 - 12 %M   POC Granulocyte 2.7 2 - 6.9   Granulocyte percent 49.1 37 - 80 %G   RBC 4.12 4.04 - 5.48 M/uL   Hemoglobin 11.9 (A) 12.2 - 16.2 g/dL   HCT, POC 36.8 (A) 37.7 - 47.9 %   MCV 89.2 80 - 97 fL   MCH, POC 28.8 27 - 31.2 pg   MCHC 32.3 31.8 - 35.4 g/dL   RDW, POC 13.8 %   Platelet Count, POC 245 142 - 424 K/uL   MPV 7.8 0 - 99.8 fL  POCT urinalysis dipstick  Result Value Ref Range   Color, UA yellow    Clarity, UA clear    Glucose, UA negative    Bilirubin, UA negative    Ketones, UA negative    Spec Grav, UA >=1.030    Blood, UA trace-lysed    pH, UA 5.0    Protein, UA negative    Urobilinogen, UA 0.2    Nitrite, UA negative    Leukocytes, UA Negative Negative  POCT UA - Microscopic Only  Result Value Ref Range   WBC, Ur, HPF, POC 0-1    RBC, urine, microscopic 0-2    Bacteria, U Microscopic 1+    Mucus, UA negative    Epithelial cells, urine per micros 1-5    Crystals, Ur, HPF, POC negative    Casts, Ur, LPF, POC negative    Yeast, UA negative    Normal EKG Assessment & Plan:   Assessment:  Fatigue Mild nonspecific dizziness Mild chest pains Sleep disturbance Family stress   Plan:  Ambien 5 mg. If not improved and trazodone 25 to that.  Return in one month, sooner if not improved  Patient Instructions  We will give you medication to try and help your sleep over the next 2 weeks.   If symptoms continue to persist please return.  Take Ambien 5  mg onepill at bedtime for sleep.   If after 5 or 6 days if not improved, begin taking trazodone 25 mg at bedtime along with that.  If not improved in 2 weeks please return. Either way I would like to assess that you come back in about 1 month for a follow-up.     HOPPER,DAVID, MD 08/31/2014

## 2014-08-31 NOTE — Patient Instructions (Addendum)
We will give you medication to try and help your sleep over the next 2 weeks.   If symptoms continue to persist please return.  Take Ambien 5 mg onepill at bedtime for sleep.   If after 5 or 6 days if not improved, begin taking trazodone 25 mg at bedtime along with that.  If not improved in 2 weeks please return. Either way I would like to assess that you come back in about 1 month for a follow-up.

## 2014-09-01 LAB — TSH: TSH: 0.88 u[IU]/mL (ref 0.350–4.500)

## 2014-09-01 LAB — FERRITIN: FERRITIN: 34 ng/mL (ref 10–291)

## 2014-09-02 ENCOUNTER — Encounter: Payer: Self-pay | Admitting: Family Medicine

## 2014-09-08 ENCOUNTER — Other Ambulatory Visit: Payer: Self-pay | Admitting: Family Medicine

## 2014-09-08 ENCOUNTER — Ambulatory Visit (INDEPENDENT_AMBULATORY_CARE_PROVIDER_SITE_OTHER): Payer: 59 | Admitting: Family Medicine

## 2014-09-08 VITALS — BP 120/88 | HR 68 | Temp 98.5°F | Resp 16 | Ht 66.0 in | Wt 229.0 lb

## 2014-09-08 DIAGNOSIS — R319 Hematuria, unspecified: Secondary | ICD-10-CM | POA: Diagnosis not present

## 2014-09-08 DIAGNOSIS — R519 Headache, unspecified: Secondary | ICD-10-CM

## 2014-09-08 DIAGNOSIS — R42 Dizziness and giddiness: Secondary | ICD-10-CM | POA: Diagnosis not present

## 2014-09-08 DIAGNOSIS — R51 Headache: Secondary | ICD-10-CM | POA: Diagnosis not present

## 2014-09-08 DIAGNOSIS — J0101 Acute recurrent maxillary sinusitis: Secondary | ICD-10-CM | POA: Diagnosis not present

## 2014-09-08 DIAGNOSIS — L089 Local infection of the skin and subcutaneous tissue, unspecified: Secondary | ICD-10-CM

## 2014-09-08 MED ORDER — AMOXICILLIN-POT CLAVULANATE 875-125 MG PO TABS: 1.0000 | ORAL_TABLET | Freq: Two times a day (BID) | ORAL | Status: DC

## 2014-09-08 NOTE — Patient Instructions (Signed)
Dizziness Dizziness is a common problem. It is a feeling of unsteadiness or light-headedness. You may feel like you are about to faint. Dizziness can lead to injury if you stumble or fall. A person of any age group can suffer from dizziness, but dizziness is more common in older adults. CAUSES  Dizziness can be caused by many different things, including:  Middle ear problems.  Standing for too long.  Infections.  An allergic reaction.  Aging.  An emotional response to something, such as the sight of blood.  Side effects of medicines.  Tiredness.  Problems with circulation or blood pressure.  Excessive use of alcohol or medicines, or illegal drug use.  Breathing too fast (hyperventilation).  An irregular heart rhythm (arrhythmia).  A low red blood cell count (anemia).  Pregnancy.  Vomiting, diarrhea, fever, or other illnesses that cause body fluid loss (dehydration).  Diseases or conditions such as Parkinson's disease, high blood pressure (hypertension), diabetes, and thyroid problems.  Exposure to extreme heat. DIAGNOSIS  Your health care provider will ask about your symptoms, perform a physical exam, and perform an electrocardiogram (ECG) to record the electrical activity of your heart. Your health care provider may also perform other heart or blood tests to determine the cause of your dizziness. These may include:  Transthoracic echocardiogram (TTE). During echocardiography, sound waves are used to evaluate how blood flows through your heart.  Transesophageal echocardiogram (TEE).  Cardiac monitoring. This allows your health care provider to monitor your heart rate and rhythm in real time.  Holter monitor. This is a portable device that records your heartbeat and can help diagnose heart arrhythmias. It allows your health care provider to track your heart activity for several days if needed.  Stress tests by exercise or by giving medicine that makes the heart beat  faster. TREATMENT  Treatment of dizziness depends on the cause of your symptoms and can vary greatly. HOME CARE INSTRUCTIONS   Drink enough fluids to keep your urine clear or pale yellow. This is especially important in very hot weather. In older adults, it is also important in cold weather.  Take your medicine exactly as directed if your dizziness is caused by medicines. When taking blood pressure medicines, it is especially important to get up slowly.  Rise slowly from chairs and steady yourself until you feel okay.  In the morning, first sit up on the side of the bed. When you feel okay, stand slowly while holding onto something until you know your balance is fine.  Move your legs often if you need to stand in one place for a long time. Tighten and relax your muscles in your legs while standing.  Have someone stay with you for 1-2 days if dizziness continues to be a problem. Do this until you feel you are well enough to stay alone. Have the person call your health care provider if he or she notices changes in you that are concerning.  Do not drive or use heavy machinery if you feel dizzy.  Do not drink alcohol. SEEK IMMEDIATE MEDICAL CARE IF:   Your dizziness or light-headedness gets worse.  You feel nauseous or vomit.  You have problems talking, walking, or using your arms, hands, or legs.  You feel weak.  You are not thinking clearly or you have trouble forming sentences. It may take a friend or family member to notice this.  You have chest pain, abdominal pain, shortness of breath, or sweating.  Your vision changes.  You notice   any bleeding. °· You have side effects from medicine that seems to be getting worse rather than better. °MAKE SURE YOU:  °· Understand these instructions. °· Will watch your condition. °· Will get help right away if you are not doing well or get worse. °Document Released: 06/13/2000 Document Revised: 12/23/2012 Document Reviewed: 07/07/2010 °ExitCare®  Patient Information ©2015 [employer]. This information is not intended to replace advice given to you by your health care provider. Make sure you discuss any questions you have with your health care provider. ° °Sinusitis °Sinusitis is redness, soreness, and inflammation of the paranasal sinuses. Paranasal sinuses are air pockets within the bones of your face (beneath the eyes, the middle of the forehead, or above the eyes). In healthy paranasal sinuses, mucus is able to drain out, and air is able to circulate through them by way of your nose. However, when your paranasal sinuses are inflamed, mucus and air can become trapped. This can allow bacteria and other germs to grow and cause infection. °Sinusitis can develop quickly and last only a short time (acute) or continue over a long period (chronic). Sinusitis that lasts for more than 12 weeks is considered chronic.  °CAUSES  °Causes of sinusitis include: °· Allergies. °· Structural abnormalities, such as displacement of the cartilage that separates your nostrils (deviated septum), which can decrease the air flow through your nose and sinuses and affect sinus drainage. °· Functional abnormalities, such as when the small hairs (cilia) that line your sinuses and help remove mucus do not work properly or are not present. °SIGNS AND SYMPTOMS  °Symptoms of acute and chronic sinusitis are the same. The primary symptoms are pain and pressure around the affected sinuses. Other symptoms include: °· Upper toothache. °· Earache. °· Headache. °· Bad breath. °· Decreased sense of smell and taste. °· A cough, which worsens when you are lying flat. °· Fatigue. °· Fever. °· Thick drainage from your nose, which often is green and may contain pus (purulent). °· Swelling and warmth over the affected sinuses. °DIAGNOSIS  °Your health care provider will perform a physical exam. During the exam, your health care provider may: °· Look in your nose for signs of abnormal growths in your  nostrils (nasal polyps). °· Tap over the affected sinus to check for signs of infection. °· View the inside of your sinuses (endoscopy) using an imaging device that has a light attached (endoscope). °If your health care provider suspects that you have chronic sinusitis, one or more of the following tests may be recommended: °· Allergy tests. °· Nasal culture. A sample of mucus is taken from your nose, sent to a lab, and screened for bacteria. °· Nasal cytology. A sample of mucus is taken from your nose and examined by your health care provider to determine if your sinusitis is related to an allergy. °TREATMENT  °Most cases of acute sinusitis are related to a viral infection and will resolve on their own within 10 days. Sometimes medicines are prescribed to help relieve symptoms (pain medicine, decongestants, nasal steroid sprays, or saline sprays).  °However, for sinusitis related to a bacterial infection, your health care provider will prescribe antibiotic medicines. These are medicines that will help kill the bacteria causing the infection.  °Rarely, sinusitis is caused by a fungal infection. In theses cases, your health care provider will prescribe antifungal medicine. °For some cases of chronic sinusitis, surgery is needed. Generally, these are cases in which sinusitis recurs more than 3 times per year, despite other   treatments. °HOME CARE INSTRUCTIONS  °· Drink plenty of water. Water helps thin the mucus so your sinuses can drain more easily. °· Use a humidifier. °· Inhale steam 3 to 4 times a day (for example, sit in the bathroom with the shower running). °· Apply a warm, moist washcloth to your face 3 to 4 times a day, or as directed by your health care provider. °· Use saline nasal sprays to help moisten and clean your sinuses. °· Take medicines only as directed by your health care provider. °· If you were prescribed either an antibiotic or antifungal medicine, finish it all even if you start to feel  better. °SEEK IMMEDIATE MEDICAL CARE IF: °· You have increasing pain or severe headaches. °· You have nausea, vomiting, or drowsiness. °· You have swelling around your face. °· You have vision problems. °· You have a stiff neck. °· You have difficulty breathing. °MAKE SURE YOU:  °· Understand these instructions. °· Will watch your condition. °· Will get help right away if you are not doing well or get worse. °Document Released: 12/18/2004 Document Revised: 05/04/2013 Document Reviewed: 01/02/2011 °ExitCare® Patient Information ©2015 [employer]. This information is not intended to replace advice given to you by your health care provider. Make sure you discuss any questions you have with your health care provider. ° °

## 2014-09-08 NOTE — Progress Notes (Signed)
 Chief Complaint:  Chief Complaint  Patient presents with  . Migraine    x  2 days  . Dizziness    x 2 days  . Nausea    x 2 days  . Sinusitis    greenish mucus / x 2 days  . sore on left leg    possible boil / x 1 month    HPI: Brianna Hawkins is a 54 y.o. female who reports to [facility] today complaining of this morning waking up with headache and runny nose, face feels tight and dizzy, and she is slightly nauseated. She blew her nose and had light green mucus. She is not sure if just a headcold. Doe snot feel like her normal headaches, she had last typical HA about  2 weeks ago. She gets it twice a month. She has been to a neurologist or memory loss but not HAs. She ahs some ear pressure, She ahs some nosie sensitivity. She ahs some chest pain every now and then,this is not new, it is right sided on her chest. She ahs allergies to bananas and also tomato but No other food allergies. Has a hx of chemical allergies. 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 Other paranasal sinuses are clear.   Electronically Signed  By: Lars Pinks M.D.  On: 06/12/2013 09:15  Past Medical History  Diagnosis Date  . Urinary urgency   . Pain, joint, shoulder region, right   . Headache(784.0)   . Hypertension   . Allergy   . Blood transfusion   . GERD (gastroesophageal reflux disease)   . Tuberculosis     7 YEARS AGO  . Blood transfusion without reported diagnosis   . Arthritis   . Substance abuse   . Heart murmur     very mild-echo 1/15-normal   Past Surgical History  Procedure Laterality Date  . Tubal ligation    . Breast lumpectomy  2012    Underarm-rt-extra tissue  . Colonoscopy    . Upper gi endoscopy    . Ethmoidectomy Left 07/10/2013    Procedure: ETHMOIDECTOMY LEFT,MAXILLARY OSTIAL ENLARGEMENT WITH REMOVAL OF DISEASE;  Surgeon: Rozetta Nunnery, MD;  Location: [place];  Service: ENT;  Laterality: Left;  . Turbinate reduction Left 07/10/2013    Procedure: LEFT TURBINATE REDUCTION;  Surgeon: Rozetta Nunnery, MD;  Location: [place];  Service: ENT;  Laterality: Left;  . Nose surgery     Social History   Social History  . Marital Status: Single    Spouse Name: N/A  . Number of Children: 4  . Years of Education: N/A   Occupational History  . [employer]   Social History Main Topics  . Smoking status: Former Smoker -- 0.30 packs/day for 15 years    Types: Cigarettes    Quit date: 01/01/2001  . Smokeless tobacco: Never Used     Comment: "never inhaled"  . Alcohol Use: No  . Drug Use: No  . Sexual Activity: Yes   Other Topics Concern  . None   Social History Narrative  Single. Education: [employer].   Family History  Problem Relation Age of Onset  . Throat cancer Father     was a smoker  . Colon cancer Father   . Dementia Other   . HIV Sister   . Stroke Mother   . Diabetes Paternal Grandmother   . Diabetes Maternal Grandmother    Allergies  Allergen Reactions  . Banana Itching    Of throat  . Tomato Rash   Prior to Admission medications   Medication Sig Start Date End Date Taking? Authorizing Provider  albuterol (PROVENTIL HFA;VENTOLIN HFA) 108 (90 BASE) MCG/ACT inhaler Inhale 2 puffs into the lungs every 4 (four) hours as needed for wheezing or shortness of breath (cough, shortness of breath or wheezing.). 02/26/14  Yes Todd McVeigh, PA  amLODipine (NORVASC) 10 MG tablet TAKE 1 TABLET BY MOUTH ONCE DAILY **NEED OFFICE VISIT FOR REFILLS** 03/26/14  Yes [name], MD  cyclobenzaprine (FLEXERIL) 5 MG tablet Take 1 tablet (5 mg total) by  mouth 3 (three) times daily as needed for muscle spasms. 05/03/14  Yes Todd McVeigh, PA  esomeprazole (NEXIUM) 40 MG capsule Take 1 capsule (40 mg total) by mouth daily before breakfast. 04/30/13  Yes [name], MD  estradiol (VIVELLE-DOT) 0.025 MG/24HR Place 1 patch onto the skin 2 (two) times a week. 02/18/14  Yes [name], MD  estradiol (VIVELLE-DOT) 0.05 MG/24HR patch Place 1 patch (0.05 mg total) onto the skin 2 (two) times a week. 03/16/14  Yes Roselee Culver, MD  hydrochlorothiazide (MICROZIDE) 12.5 MG capsule Take 1 capsule (12.5 mg total) by mouth daily. 06/17/13  Yes [name], MD  hydrOXYzine (ATARAX/VISTARIL) 25 MG tablet Take one half to one every 8 hours as needed for leg symptoms 07/08/14  Yes [name], MD  LORazepam (ATIVAN) 1 MG tablet Take 1 tablet (1 mg total) by mouth at bedtime. 06/01/14  Yes [name], MD  meclizine (ANTIVERT) 25 MG tablet Take 25 mg by mouth 3 (three) times daily as needed for dizziness.   Yes Historical Provider, MD  meloxicam (MOBIC) 7.5 MG tablet Take one daily for pain and inflammation in hip or shoulder. 07/08/14  Yes [name], MD  ondansetron (ZOFRAN) 4 MG tablet Take 1 tablet (4 mg total) by mouth every 8 (eight) hours as needed for nausea or vomiting. 06/01/14  Yes [name], MD  progesterone (PROMETRIUM) 100 MG capsule Take 1 capsule (100 mg total) by mouth daily. 02/18/14  Yes [name], MD  SUMAtriptan (IMITREX) 100 MG tablet Take 1 tablet (100 mg total) by mouth every 2 (two) hours as needed for migraine. 10/03/12  Yes [name], MD  traMADol (ULTRAM) 50 MG tablet Take 2 tablets (100 mg total) by mouth every 12 (twelve) hours as needed. 06/01/14  Yes [name], MD  traZODone (DESYREL) 50 MG tablet Take 0.5-1 tablets (25-50 mg total) by mouth at bedtime as needed for sleep. 08/31/14  Yes [name], MD  zolpidem (AMBIEN) 5 MG tablet Take one half to one pill at bedtime as needed for sleep  08/31/14  Yes [name], MD  fluticasone [facility]) 50 MCG/ACT nasal spray Place 2 sprays into both nostrils daily. Patient not taking: Reported on 09/08/2014 04/02/13   [name], PA-C  Multiple Vitamins-Minerals (HAIR/SKIN/NAILS) TABS Take 1 tablet by mouth daily.    Historical Provider, MD     ROS: The patient denies fevers, chills, night sweats, unintentional weight loss, chest  pain, palpitations, wheezing, dyspnea on exertion, nausea, vomiting, abdominal pain, dysuria, hematuria, melena, numbness, weakness, or tingling.   All other systems have been reviewed and were otherwise negative with the exception of those mentioned in the HPI and as above.    PHYSICAL EXAM: Filed Vitals:   09/08/14 1322  BP: 120/88  Pulse: 68  Temp: 98.5 F (36.9 C)  Resp: 16   Body mass index is 36.98 kg/(m^2).   General: Alert, no acute distress HEENT:  Normocephalic, atraumatic, oropharynx patent. EOMI, PERRLA Erythematous throat, no exudates, TM normal, + left sided sinus tenderness, + erythematous/boggy nasal mucosa Cardiovascular:  Regular rate and rhythm, no rubs murmurs or gallops.  No Carotid bruits, radial pulse intact. No pedal edema.  Respiratory: Clear to auscultation bilaterally.  No wheezes, rales, or rhonchi.  No cyanosis, no use of accessory musculature Abdominal: No organomegaly, abdomen is soft and non-tender, positive bowel sounds. No masses. Skin: + small nonindurated bug bite, blister has popped Neurologic: Facial musculature symmetric. She can only recall 1/3. CN 2-12 grossly nl Psychiatric: Patient acts appropriately throughout our interaction. Lymphatic: No cervical or submandibular lymphadenopathy Musculoskeletal: Gait intact. No edema, tenderness Orthostatic BP: standing 163/96, 62; sitting 155/97, 62; lying 159/97, 65 nondizzy Neuro exam normal, slightly dizzy with romberg hx of vrtigo , no nystagmus   LABS: Results for orders placed or performed in visit on 08/31/14    Ferritin  Result Value Ref Range   Ferritin 34 10 - 291 ng/mL  TSH  Result Value Ref Range   TSH 0.880 0.350 - 4.500 uIU/mL  POCT glycosylated hemoglobin (Hb A1C)  Result Value Ref Range   Hemoglobin A1C 5.9   POCT CBC  Result Value Ref Range   WBC 5.6 4.6 - 10.2 K/uL   Lymph, poc 2.5 0.6 - 3.4   POC LYMPH PERCENT 44.7 10 - 50 %L   MID (cbc) 0.3 0 - 0.9   POC MID % 6.2 0 - 12 %M   POC Granulocyte 2.7 2 - 6.9   Granulocyte percent 49.1 37 - 80 %G   RBC 4.12 4.04 - 5.48 M/uL   Hemoglobin 11.9 (A) 12.2 - 16.2 g/dL   HCT, POC 36.8 (A) 37.7 - 47.9 %   MCV 89.2 80 - 97 fL   MCH, POC 28.8 27 - 31.2 pg   MCHC 32.3 31.8 - 35.4 g/dL   RDW, POC 13.8 %   Platelet Count, POC 245 142 - 424 K/uL   MPV 7.8 0 - 99.8 fL  POCT urinalysis dipstick  Result Value Ref Range   Color, UA yellow    Clarity, UA clear    Glucose, UA negative    Bilirubin, UA negative    Ketones, UA negative    Spec Grav, UA >=1.030    Blood, UA trace-lysed    pH, UA 5.0    Protein, UA negative    Urobilinogen, UA 0.2    Nitrite, UA negative    Leukocytes, UA Negative Negative  POCT UA - Microscopic Only  Result Value Ref Range   WBC, Ur, HPF, POC 0-1    RBC, urine, microscopic 0-2    Bacteria, U Microscopic 1+    Mucus, UA negative    Epithelial cells, urine per micros 1-5    Crystals, Ur, HPF, POC negative    Casts, Ur, LPF, POC negative    Yeast, UA negative      EKG/XRAY:   Primary read interpreted by Dr. Marin Comment at [facility].  ASSESSMENT/PLAN: Encounter Diagnoses  Name Primary?  . Acute recurrent maxillary sinusitis Yes  . Acute nonintractable headache, unspecified headache type   . Dizziness and giddiness   . Skin infection   . Hematuria     Rx Augmentin for sinusitis , ho sinusitis and also for skin infectin Return to office for hematuria recheck when she is done with Augmentin She is menopausal and is on estrogen, but denies any vaginal dryness, deneis any prior hx of smoking.  Refer to  neurology for headaches  Gross sideeffects, risk and benefits, and alternatives of medications d/w patient. Patient is aware that all medications have potential sideeffects and we are unable to predict every sideeffect or drug-drug interaction that may occur.    DO  09/08/2014 2:31 PM

## 2014-09-09 ENCOUNTER — Telehealth: Payer: Self-pay

## 2014-09-09 NOTE — Telephone Encounter (Signed)
Letter written. Pt notified. 

## 2014-09-09 NOTE — Telephone Encounter (Signed)
PATIENT STATES SHE SAW DR. Marin Comment Wednesday FOR HEADACHES. DR. Marin Comment GAVE HER AMOXICILLIN 875/125 MG WHICH SHE IS TAKING. SHE SAID DR. LE TOLD HER SHE SHOULD START TO FEEL BETTER TODAY AND BE ABLE TO GO BACK TO WORK. PATIENT SAID SHE STILL HAS A HEADACHE AND CANNOT RETURN BACK TO WORK UNTIL Friday. SHE WOULD LIKE TO GET HER NOTE EXTENDED. SHE WILL BE OUT TODAY 09/09/14 AND RETURNING Friday 09/10/14. PLEASE CALL HER WHEN IT CAN BE PICKED UP. BEST PHONE [phone] (CELL)  [place]

## 2014-10-01 IMAGING — CT CT ANGIO CHEST
2 of 6 series · 19 of 36 positions shown · IV contrast ([ID] OMNI 350)
Comparison: CT scan of March 30, 2011.

CLINICAL DATA: Chest pain.

EXAM:
CT ANGIOGRAPHY CHEST WITH CONTRAST
TECHNIQUE: Multidetector CT imaging of the chest was performed using the
standard protocol during bolus administration of intravenous
contrast. Multiplanar CT image reconstructions and MIPs were
obtained to evaluate the vascular anatomy.
CONTRAST:  100 mL of Omnipaque 350 intravenously.

[Series 103: super d · axial · 0.62mm/px · z∈[-319,-64]mm · 18 of 286 slices shown]
[im 16/286  lung]
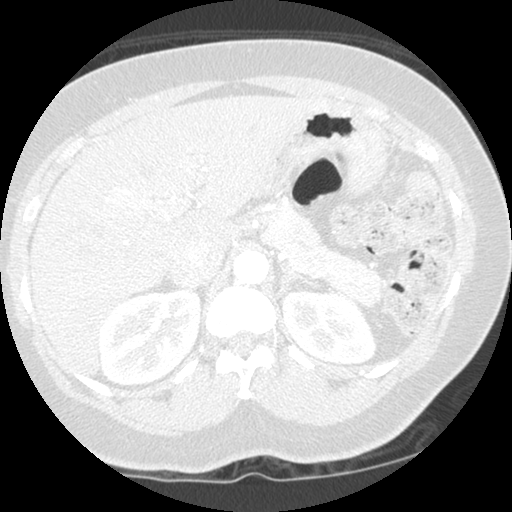
[im 31/286  mediastinal]
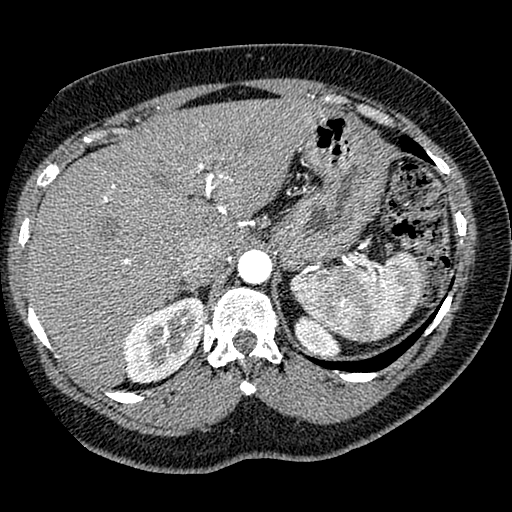
[im 46/286  lung]
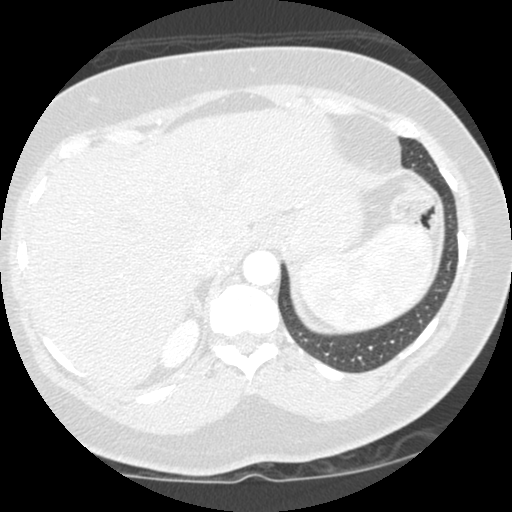
[im 61/286  mediastinal]
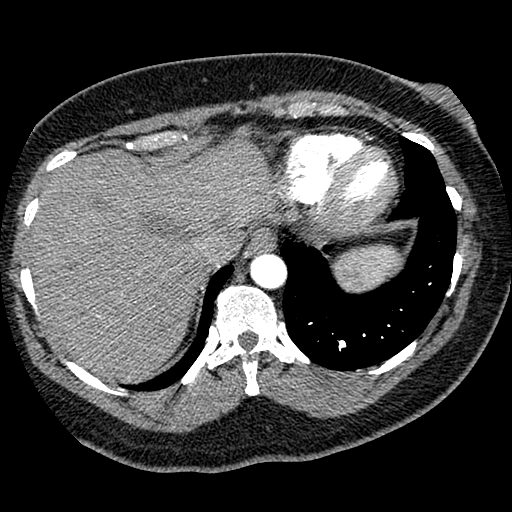
[im 76/286  lung]
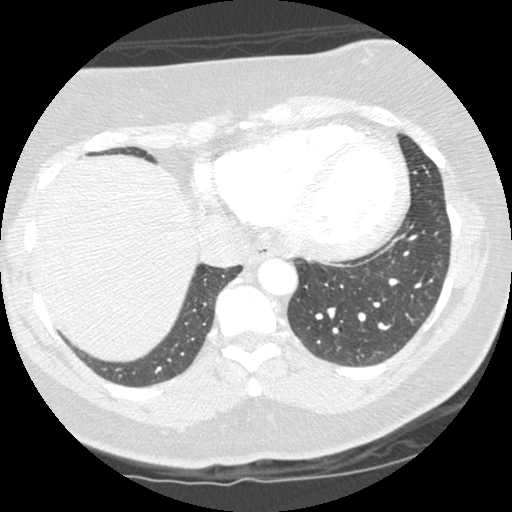
[im 91/286  mediastinal]
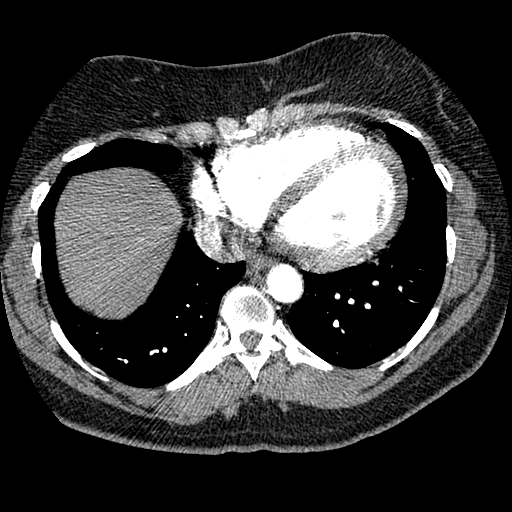
[im 106/286  lung]
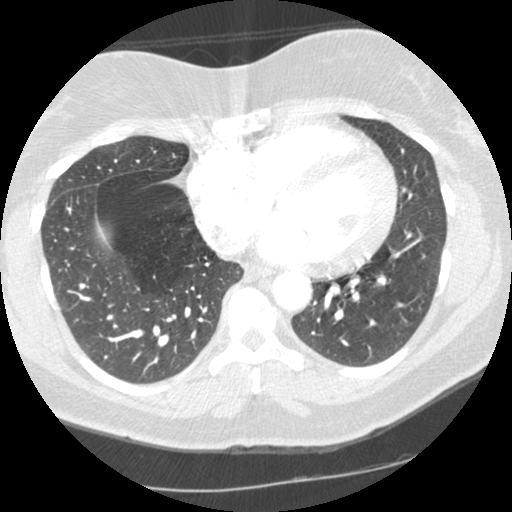
[im 121/286  mediastinal]
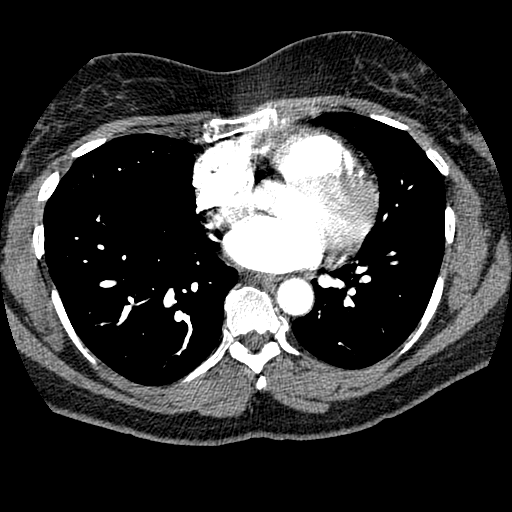
[im 136/286  lung]
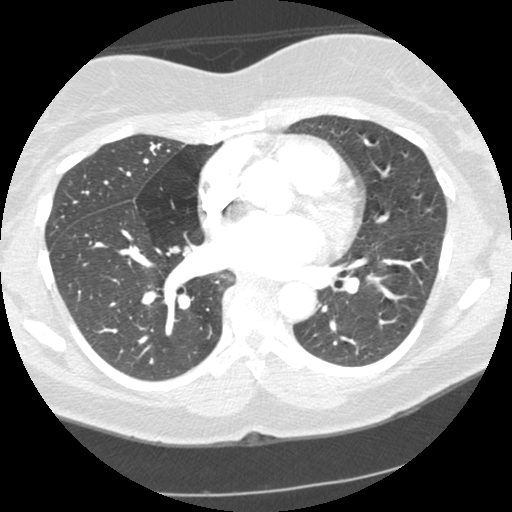
[im 151/286  mediastinal]
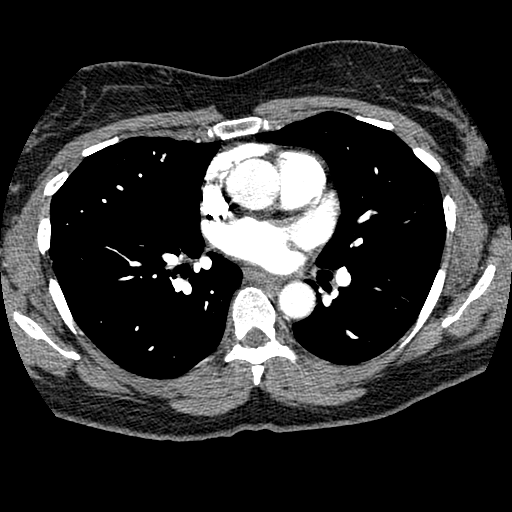
[im 166/286  lung]
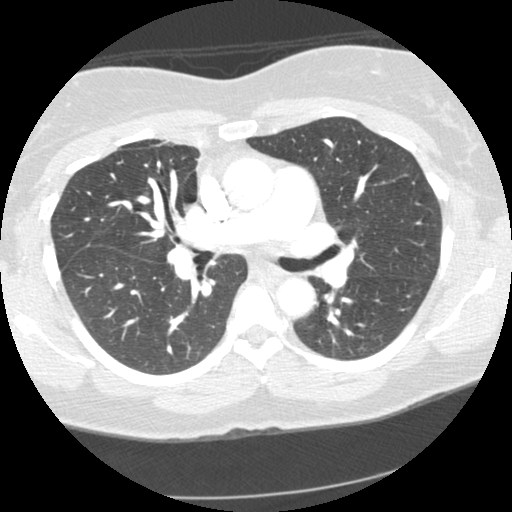
[im 181/286  mediastinal]
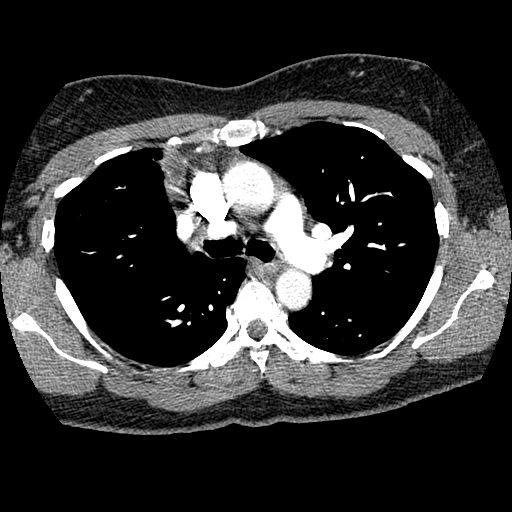
[im 196/286  lung]
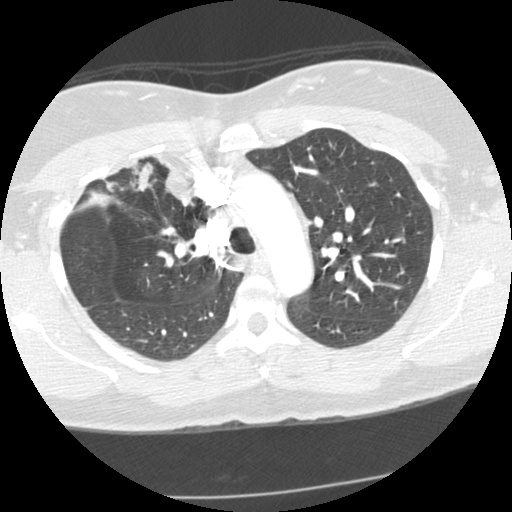
[im 211/286  mediastinal]
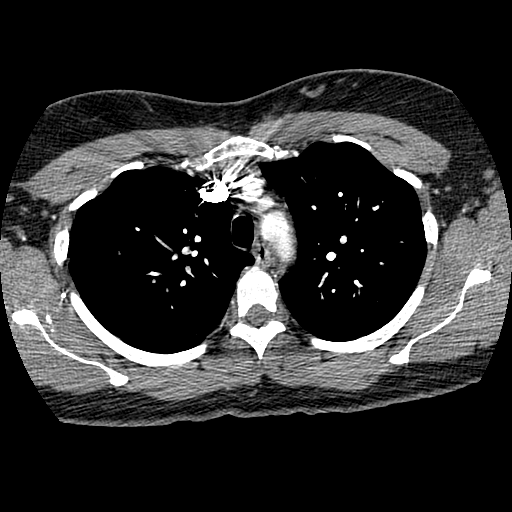
[im 226/286  lung]
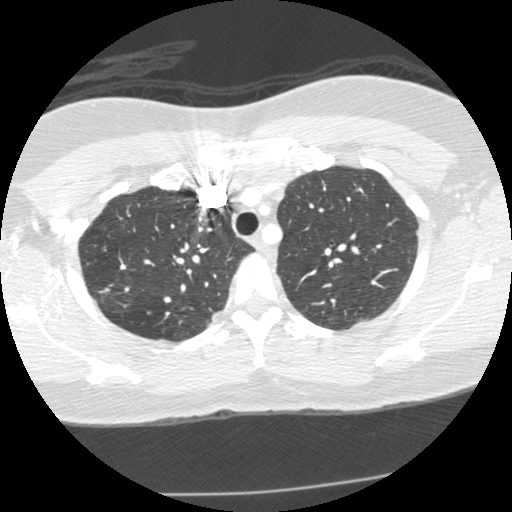
[im 241/286  mediastinal]
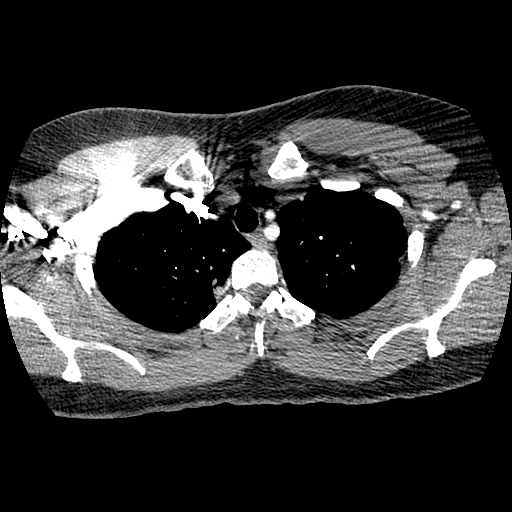
[im 256/286  lung]
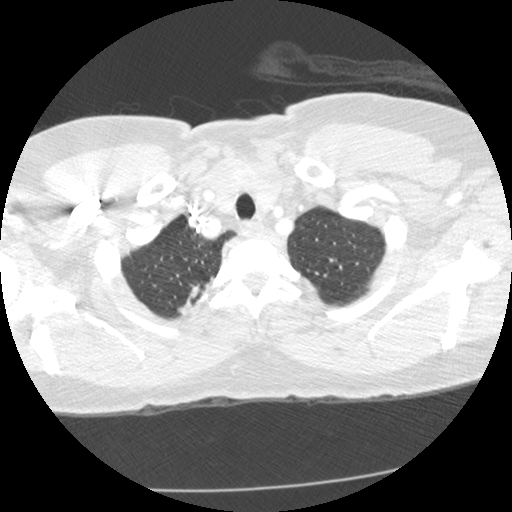
[im 271/286  mediastinal]
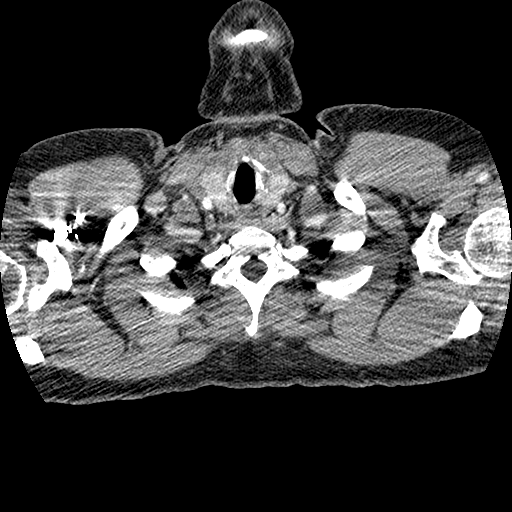

[Series 500: cor · coronal · 0.70mm/px · 1 of 107 slices shown]
[im 54/107  mediastinal]
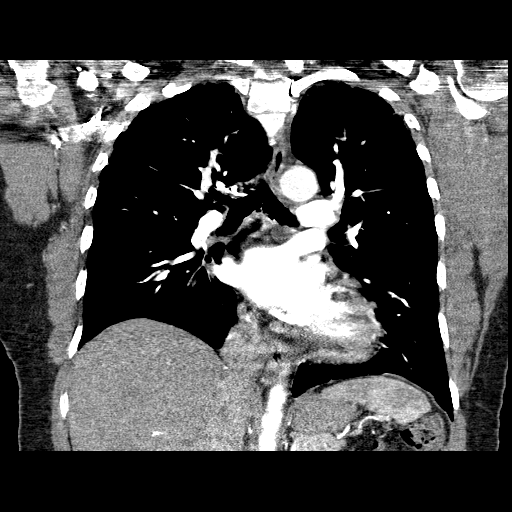

[19 of 36 positions shown; findings below may reference images not displayed]

FINDINGS: No pneumothorax or pleural effusion is noted. Left lung appears
clear. Stable density is seen anteriorly in the right upper lobe
which is unchanged compared to prior exam and most consistent with
scarring. Stable nodular density is seen laterally in the right
upper lobe also consistent with scarring no acute pulmonary disease
is noted. There is no evidence of pulmonary embolus. No evidence of
aneurysm or dissection is seen in the thoracic aorta. Visualized
portion of upper abdomen appears normal. No mediastinal mass or
adenopathy is noted.

Review of the MIP images confirms the above findings.
IMPRESSION: No evidence of pulmonary embolus.

Stable nodular densities are noted in the right lung most consistent
with scarring. No acute pulmonary disease is noted.

These results will be called to the ordering clinician or
representative by the [HOSPITAL] at the imaging location.

## 2014-10-14 ENCOUNTER — Encounter: Payer: Self-pay | Admitting: Neurology

## 2014-10-14 ENCOUNTER — Telehealth: Payer: Self-pay | Admitting: Neurology

## 2014-10-14 ENCOUNTER — Ambulatory Visit (INDEPENDENT_AMBULATORY_CARE_PROVIDER_SITE_OTHER): Payer: 59 | Admitting: Neurology

## 2014-10-14 VITALS — BP 134/88 | HR 69 | Ht 66.0 in | Wt 224.0 lb

## 2014-10-14 DIAGNOSIS — G43009 Migraine without aura, not intractable, without status migrainosus: Secondary | ICD-10-CM | POA: Diagnosis not present

## 2014-10-14 MED ORDER — RIZATRIPTAN BENZOATE 10 MG PO TBDP: 10.0000 mg | ORAL_TABLET | ORAL | Status: DC | PRN

## 2014-10-14 MED ORDER — SUMATRIPTAN SUCCINATE 6 MG/0.5ML ~~LOC~~ SOSY: 6.0000 mg | PREFILLED_SYRINGE | SUBCUTANEOUS | Status: DC | PRN

## 2014-10-14 MED ORDER — SUMATRIPTAN SUCCINATE REFILL 6 MG/0.5ML ~~LOC~~ SOCT: 6.0000 mg | SUBCUTANEOUS | Status: DC | PRN

## 2014-10-14 NOTE — Progress Notes (Signed)
PATIENT: Brianna Hawkins DOB: [birth year]  Chief Complaint  Patient presents with  . Migraine    She has been having migraines for about two years.  She has never been on a daily preventive medication.  She typically uses sumatriptan and Excedrin Migraine for treatment.  She will sparingly use Tramadol but it tends to upset her stomach.     HISTORICAL  Brianna Hawkins is a 54 year old right-handed female, seen in refer by her primary care physician Dr.  [name] in October 14 2014 for evaluation of headaches.  She had a history of pulmonary tuberculosis was treated in 2007, hypertension, history of chronic sinus, status post sinus surgery  She reported a history of headaches since 2014, lateralized retro-orbital area severe pounding headache with associated noise sensitivity, nausea or vomiting, worsening by movement, blurry vision, can last for up to 3 days,  She was not able to identify any triggers, it happened about 1-2 times each months, in between, couple times a week, she also do with similar but much mild headaches, couple times a week, she has been taking Excedrin Migraine twice a week for mild headaches, Imitrex 100 mg tablets as needed for severe headaches, it works most of the time, but sometimes she has so much nausea vomiting, she can hold down her pills.  I have personally reviewed CAT scan in 2010, no intracranial abnormality  She also complains of blurry vision, difficulty focusing, no significant weight gain   REVIEW OF SYSTEMS: Full 14 system review of systems performed and notable only for fatigue, chest pain, ringing ears, spinning sensation, blurry vision, double vision, easy bruising, feeling hot, feeling cold, memory loss, headaches, numbness, weakness, dizziness, insomnia, anxiety, not enough sleep, decreased energy, racing thoughts.  ALLERGIES: Allergies  Allergen Reactions  . Banana Itching    Of throat  . Tomato Rash    HOME  MEDICATIONS: Current Outpatient Prescriptions  Medication Sig Dispense Refill  . albuterol (PROVENTIL HFA;VENTOLIN HFA) 108 (90 BASE) MCG/ACT inhaler Inhale 2 puffs into the lungs every 4 (four) hours as needed for wheezing or shortness of breath (cough, shortness of breath or wheezing.). 1 Inhaler 1  . amLODipine (NORVASC) 10 MG tablet TAKE 1 TABLET BY MOUTH ONCE DAILY **NEED OFFICE VISIT FOR REFILLS** 30 tablet 4  . aspirin-acetaminophen-caffeine (EXCEDRIN MIGRAINE) 182-993-71 MG tablet Take by mouth as needed for headache.    . esomeprazole (NEXIUM) 40 MG capsule TAKE 1 CAPSULE BY MOUTH DAILY BEFORE BREAKFAST. 30 capsule 3  . fluticasone (FLONASE) 50 MCG/ACT nasal spray Place 2 sprays into both nostrils daily. 16 g 1  . hydrochlorothiazide (MICROZIDE) 12.5 MG capsule Take 1 capsule (12.5 mg total) by mouth daily. 90 capsule 3  . hydrOXYzine (ATARAX/VISTARIL) 25 MG tablet Take one half to one every 8 hours as needed for leg symptoms 30 tablet 1  . LORazepam (ATIVAN) 1 MG tablet Take 1 tablet (1 mg total) by mouth at bedtime. 30 tablet 1  . meclizine (ANTIVERT) 25 MG tablet Take 25 mg by mouth 3 (three) times daily as needed for dizziness.    . meloxicam (MOBIC) 7.5 MG tablet Take one daily for pain and inflammation in hip or shoulder. 30 tablet 0  . Multiple Vitamins-Minerals (HAIR/SKIN/NAILS) TABS Take 1 tablet by mouth daily.    . ondansetron (ZOFRAN) 4 MG tablet Take 1 tablet (4 mg total) by mouth every 8 (eight) hours as needed for nausea or vomiting. 20 tablet 0  . SUMAtriptan (IMITREX) 100 MG  tablet Take 1 tablet (100 mg total) by mouth every 2 (two) hours as needed for migraine. 10 tablet 0  . traMADol (ULTRAM) 50 MG tablet Take 2 tablets (100 mg total) by mouth every 12 (twelve) hours as needed. 30 tablet 0  . traZODone (DESYREL) 50 MG tablet Take 0.5-1 tablets (25-50 mg total) by mouth at bedtime as needed for sleep. 30 tablet 1  . zolpidem (AMBIEN) 5 MG tablet Take one half to one pill  at bedtime as needed for sleep 30 tablet 1   No current facility-administered medications for this visit.    PAST MEDICAL HISTORY: Past Medical History  Diagnosis Date  . Urinary urgency   . Pain, joint, shoulder region, right   . Headache(784.0)   . Hypertension   . Allergy   . Blood transfusion   . GERD (gastroesophageal reflux disease)   . Tuberculosis In 2007  . Blood transfusion without reported diagnosis   . Arthritis   . Substance abuse   . Heart murmur     very mild-echo 1/15-normal  . Vertigo   . Migraine     PAST SURGICAL HISTORY: Past Surgical History  Procedure Laterality Date  . Tubal ligation    . Breast lumpectomy  2012    Underarm-rt-extra tissue  . Colonoscopy    . Upper gi endoscopy    . Ethmoidectomy Left 07/10/2013    Procedure: ETHMOIDECTOMY LEFT,MAXILLARY OSTIAL ENLARGEMENT WITH REMOVAL OF DISEASE;  Surgeon: Rozetta Nunnery, MD;  Location: [facility];  Service: ENT;  Laterality: Left;  . Turbinate reduction Left 07/10/2013    Procedure: LEFT TURBINATE REDUCTION;  Surgeon: Rozetta Nunnery, MD;  Location: [facility];  Service: ENT;  Laterality: Left;  . Nose surgery      FAMILY HISTORY: Family History  Problem Relation Age of Onset  . Throat cancer Father     was a smoker  . Colon cancer Father   . Dementia Other   . HIV Sister   . Stroke Mother   . Diabetes Paternal Grandmother   . Diabetes Maternal Grandmother     SOCIAL HISTORY:  Social History   Social History  . Marital Status: Single    Spouse Name: N/A  . Number of Children: 4  . Years of Education: HS   Occupational History  . [employer]   Social History Main Topics  . Smoking status: Former Smoker -- 0.30 packs/day for 15 years    Types: Cigarettes    Quit date: 01/01/2001  . Smokeless tobacco: Never Used     Comment: "never inhaled"  . Alcohol Use: No  . Drug Use: No  . Sexual Activity: Yes   Other Topics  Concern  . Not on file   Social History Narrative   Single. Education: [employer].   Right-handed.   1-2 cups caffeine per day.   Lives at home with her fiancee.        PHYSICAL EXAM   Filed Vitals:   10/14/14 1004  BP: 134/88  Pulse: 69  Height: 5\' 6"  (1.676 m)  Weight: 224 lb (101.606 kg)    Not recorded      Body mass index is 36.17 kg/(m^2).  PHYSICAL EXAMNIATION:  Gen: NAD, conversant, well nourised, obese, well groomed                     Cardiovascular: Regular rate rhythm, no peripheral edema, warm, nontender. Eyes: Conjunctivae clear  without exudates or hemorrhage Neck: Supple, no carotid bruise. Pulmonary: Clear to auscultation bilaterally   NEUROLOGICAL EXAM:  MENTAL STATUS: Speech:    Speech is normal; fluent and spontaneous with normal comprehension.  Cognition:     Orientation to time, place and person     Normal recent and remote memory     Normal Attention span and concentration     Normal Language, naming, repeating,spontaneous speech     Fund of knowledge   CRANIAL NERVES: CN II: Visual fields are full to confrontation. Fundoscopic exam is normal with sharp discs and no vascular changes. Pupils are round equal and briskly reactive to light. CN III, IV, VI: extraocular movement are normal. No ptosis. CN V: Facial sensation is intact to pinprick in all 3 divisions bilaterally. Corneal responses are intact.  CN VII: Face is symmetric with normal eye closure and smile. CN VIII: Hearing is normal to rubbing fingers CN IX, X: Palate elevates symmetrically. Phonation is normal. CN XI: Head turning and shoulder shrug are intact CN XII: Tongue is midline with normal movements and no atrophy.  MOTOR: There is no pronator drift of out-stretched arms. Muscle bulk and tone are normal. Muscle strength is normal.  REFLEXES: Reflexes are 2+ and symmetric at the biceps, triceps, knees, and ankles. Plantar responses are flexor.  SENSORY: Intact to  light touch, pinprick, position sense, and vibration sense are intact in fingers and toes.  COORDINATION: Rapid alternating movements and fine finger movements are intact. There is no dysmetria on finger-to-nose and heel-knee-shin.    GAIT/STANCE: Posture is normal. Gait is steady with normal steps, base, arm swing, and turning. Heel and toe walking are normal. Tandem gait is normal.  Romberg is absent.   DIAGNOSTIC DATA (LABS, IMAGING, TESTING) - I reviewed patient records, labs, notes, testing and imaging myself where available.   ASSESSMENT AND PLAN  SCARLETT PORTLOCK is a 54 y.o. female   Chronic migraine  Imitrex subcutaneous injection, Maxalt as needed History of tuberculosis in 2007   [name], M.D. Ph.D.  [facility] [address], [place],  [address] Ph: [phone] Fax: [phone]  WI:OXBD Lauenstein, MD

## 2014-10-14 NOTE — Telephone Encounter (Signed)
I called back again and they said the situation has already been resolved.

## 2014-10-14 NOTE — Telephone Encounter (Signed)
I have spoken with Brianna Hawkins at [facility]. ph[facility].  The imitrex rx. that was sent in today was a r/f kit, for an autoinjector.  No needles included.  I escribed a new rx. that should be complete kit./fim

## 2014-10-14 NOTE — Telephone Encounter (Signed)
I called back x 2.  No one is answering the line, keeps going to a voicemail.

## 2014-10-14 NOTE — Telephone Encounter (Signed)
Manuela Schwartz with [facility] is calling regarding the Rx for SUMAtriptan Succinate Refill (IMITREX STATDOSE REFILL) 6 MG/0.5ML SOCT that was sent today for the patient. The Rx states a refill pack but the patient has never used it before so she needs a Rx for a starter pack instead.  Manuela Schwartz says the patient is there. Thank you.

## 2014-10-14 NOTE — Telephone Encounter (Signed)
Patient is calling back, has been waiting at the Pharmacy for over an hour, waiting for the needles to be called in so that she can use the starter pack SUMAtriptan Succinate Refill (IMITREX STATDOSE REFILL) 6 MG/0.5ML SOCT.

## 2014-10-28 ENCOUNTER — Other Ambulatory Visit: Payer: Self-pay | Admitting: Family Medicine

## 2014-11-04 ENCOUNTER — Ambulatory Visit (INDEPENDENT_AMBULATORY_CARE_PROVIDER_SITE_OTHER): Payer: 59

## 2014-11-04 ENCOUNTER — Ambulatory Visit (INDEPENDENT_AMBULATORY_CARE_PROVIDER_SITE_OTHER): Payer: 59 | Admitting: Family Medicine

## 2014-11-04 VITALS — BP 144/94 | HR 89 | Temp 98.5°F | Resp 16 | Ht 66.0 in | Wt 227.0 lb

## 2014-11-04 DIAGNOSIS — R9389 Abnormal findings on diagnostic imaging of other specified body structures: Secondary | ICD-10-CM

## 2014-11-04 DIAGNOSIS — R42 Dizziness and giddiness: Secondary | ICD-10-CM | POA: Diagnosis not present

## 2014-11-04 DIAGNOSIS — R938 Abnormal findings on diagnostic imaging of other specified body structures: Secondary | ICD-10-CM | POA: Diagnosis not present

## 2014-11-04 DIAGNOSIS — I10 Essential (primary) hypertension: Secondary | ICD-10-CM | POA: Diagnosis not present

## 2014-11-04 DIAGNOSIS — K0889 Other specified disorders of teeth and supporting structures: Secondary | ICD-10-CM

## 2014-11-04 DIAGNOSIS — R0789 Other chest pain: Secondary | ICD-10-CM

## 2014-11-04 LAB — POCT CBC
Granulocyte percent: 41.2 %G (ref 37–80)
HCT, POC: 36 % — AB (ref 37.7–47.9)
Hemoglobin: 12 g/dL — AB (ref 12.2–16.2)
Lymph, poc: 2.7 (ref 0.6–3.4)
MCH, POC: 29 pg (ref 27–31.2)
MCHC: 33.2 g/dL (ref 31.8–35.4)
MCV: 87.3 fL (ref 80–97)
MID (cbc): 0.4 (ref 0–0.9)
MPV: 8.5 fL (ref 0–99.8)
POC Granulocyte: 2.2 (ref 2–6.9)
POC LYMPH PERCENT: 50.7 %L — AB (ref 10–50)
POC MID %: 8.1 %M (ref 0–12)
Platelet Count, POC: 222 10*3/uL (ref 142–424)
RBC: 4.13 M/uL (ref 4.04–5.48)
RDW, POC: 13.7 %
WBC: 5.4 10*3/uL (ref 4.6–10.2)

## 2014-11-04 MED ORDER — PENICILLIN V POTASSIUM 500 MG PO TABS: 500.0000 mg | ORAL_TABLET | Freq: Three times a day (TID) | ORAL | Status: DC

## 2014-11-04 MED ORDER — HYDROCHLOROTHIAZIDE 12.5 MG PO CAPS: 12.5000 mg | ORAL_CAPSULE | Freq: Every day | ORAL | Status: DC

## 2014-11-04 MED ORDER — AMLODIPINE BESYLATE 10 MG PO TABS: 10.0000 mg | ORAL_TABLET | Freq: Every day | ORAL | Status: DC

## 2014-11-04 NOTE — Progress Notes (Addendum)
Subjective:  This chart was scribed for [name], MD by Leandra Kern, Medical Scribe. This patient was seen in Room 10 and the patient's care was started at 8:26 PM.   Patient ID: Brianna Hawkins, female    DOB: [birth year], 54 y.o.   MRN: [id number]  Chief Complaint  Patient presents with   Hypertension   Chest Pain    Pain in Ribs    HPI HPI Comments: Brianna Hawkins is a 54 y.o. female who presents to [facility] complaining of bilateral rib pain, onset 2 weeks ago. Pt reports that the pain is exacerbated with breathing and joint movements while in supine position. She also notes symptoms of fever, and congestion. Pt denies cough, or performing new exercises. She had the flu vaccine 3 weeks ago.   Pt is also complaining of elevated blood pressure. She states that her blood pressure readings usually run within normal range. Pt notes symptoms of dizziness, and pressure headache. Pt denies urinary symptoms, or nausea.   Patient also has a week of dental pain in the right lower second bicuspid after filling fell out. The pain is worsening and she plans on seeing a dentist  Pt works in [place] at [employer].    Patient Active Problem List   Diagnosis Date Noted   Chronic sinusitis 07/06/2013   Palpitations 02/18/2012   BMI 34.0-34.9,adult 08/02/2011   Chest pain 04/21/2011   Abnormal CXR 04/21/2011   Dyspnea 04/21/2011   Hypertension 02/08/2011   Vertigo, benign positional 02/08/2011   Insomnia 02/08/2011   Headache(784.0) 02/07/2011   Past Medical History  Diagnosis Date   Urinary urgency    Pain, joint, shoulder region, right    Headache(784.0)    Hypertension    Allergy    Blood transfusion    GERD (gastroesophageal reflux disease)    Tuberculosis     7 YEARS AGO   Blood transfusion without reported diagnosis    Arthritis    Substance abuse    Heart murmur     very mild-echo 1/15-normal     Vertigo    Migraine    Past Surgical History  Procedure Laterality Date   Tubal ligation     Breast lumpectomy  2012    Underarm-rt-extra tissue   Colonoscopy     Upper gi endoscopy     Ethmoidectomy Left 07/10/2013    Procedure: ETHMOIDECTOMY LEFT,MAXILLARY OSTIAL ENLARGEMENT WITH REMOVAL OF DISEASE;  Surgeon: Rozetta Nunnery, MD;  Location: [facility];  Service: ENT;  Laterality: Left;   Turbinate reduction Left 07/10/2013    Procedure: LEFT TURBINATE REDUCTION;  Surgeon: Rozetta Nunnery, MD;  Location: [facility];  Service: ENT;  Laterality: Left;   Nose surgery     Allergies  Allergen Reactions   Banana Itching    Of throat   Tomato Rash   Prior to Admission medications   Medication Sig Start Date End Date Taking? Authorizing Provider  albuterol (PROVENTIL HFA;VENTOLIN HFA) 108 (90 BASE) MCG/ACT inhaler Inhale 2 puffs into the lungs every 4 (four) hours as needed for wheezing or shortness of breath (cough, shortness of breath or wheezing.). 02/26/14  Yes Todd McVeigh, PA  amLODipine (NORVASC) 10 MG tablet TAKE 1 TABLET BY MOUTH ONCE DAILY **NEED OFFICE VISIT FOR REFILLS** 03/26/14  Yes [name], MD  aspirin-acetaminophen-caffeine [facility] MIGRAINE) [phone] MG tablet Take by mouth as needed for headache.   Yes Historical Provider, MD  esomeprazole (NEXIUM) 40 MG capsule TAKE 1 CAPSULE BY MOUTH DAILY BEFORE BREAKFAST. 09/09/14  Yes [name], MD  fluticasone [facility]) 50 MCG/ACT nasal spray Place 2 sprays into both nostrils daily. 04/02/13  Yes Ryan M Dunn, PA-C  hydrochlorothiazide (MICROZIDE) 12.5 MG capsule Take 1 capsule (12.5 mg total) by mouth daily. 06/17/13  Yes [name], MD  hydrOXYzine (ATARAX/VISTARIL) 25 MG tablet Take one half to one every 8 hours as needed for leg symptoms 07/08/14  Yes [name], MD  LORazepam (ATIVAN) 1 MG tablet Take 1 tablet (1 mg total) by mouth at bedtime. 06/01/14  Yes [name], MD  meclizine (ANTIVERT) 25 MG tablet Take 25 mg by mouth 3 (three) times daily as needed for dizziness.   Yes Historical Provider, MD  meloxicam (MOBIC) 7.5 MG tablet Take one daily for pain and inflammation in hip or shoulder. 07/08/14  Yes [name], MD  Multiple Vitamins-Minerals (HAIR/SKIN/NAILS) TABS Take 1 tablet by mouth daily.   Yes Historical Provider, MD  ondansetron (ZOFRAN) 4 MG tablet Take 1 tablet (4 mg total) by mouth every 8 (eight) hours as needed for nausea or vomiting. 06/01/14  Yes [name], MD  rizatriptan (MAXALT-MLT) 10 MG disintegrating tablet Take 1 tablet (10 mg total) by mouth as needed. May repeat in 2 hours if needed 10/14/14  Yes [name], MD  SUMAtriptan (IMITREX) 6 MG/0.5ML SOSY injection Inject 0.5 mLs (6 mg total) into the skin every 2 (two) hours as needed for migraine or headache (One injection at onset of headache/fim). F 10/14/14  Yes [name], MD  traMADol (ULTRAM) 50 MG tablet Take 2 tablets (100 mg total) by mouth every 12 (twelve) hours as needed. 06/01/14  Yes [name], MD  traZODone (DESYREL) 50 MG tablet Take 0.5-1 tablets (25-50 mg total) by mouth at bedtime as needed for sleep. 08/31/14  Yes [name], MD  zolpidem (AMBIEN) 5 MG tablet Take one half to one pill at bedtime as needed for sleep 08/31/14  Yes [name], MD   Social History   Social History   Marital Status: Single    Spouse Name: N/A   Number of Children: 4   Years of Education: HS   Occupational History   [employer]   Social History Main Topics   Smoking status: Former Smoker -- 0.30 packs/day for 15 years    Types: Cigarettes    Quit date: 01/01/2001   Smokeless tobacco: Never Used     Comment: "never inhaled"   Alcohol Use: No   Drug Use: No   Sexual Activity: Yes   Other Topics Concern   Not on file   Social History Narrative   Single. Education: [employer].   Right-handed.   1-2 cups caffeine  per day.   Lives at home with her fiancee.       Review of Systems  Constitutional: Positive for fever.  HENT: Positive for congestion.   Respiratory: Negative for cough.   Cardiovascular: Positive for chest pain (rib pain).  Gastrointestinal: Negative for nausea.  Genitourinary: Negative for dysuria, urgency and frequency.  Neurological: Positive for dizziness and headaches.      Objective:   Physical Exam  Constitutional: She is oriented to person, place, and time. She appears well-developed and well-nourished. No distress.  HENT:  Head: Normocephalic and atraumatic.  Eyes: EOM are normal. Pupils are equal, round, and reactive to light.  Neck: Neck  supple.  Cardiovascular: Normal rate.   Pulmonary/Chest: Effort normal.  Neurological: She is alert and oriented to person, place, and time. No cranial nerve deficit.  Skin: Skin is warm and dry.  Psychiatric: She has a normal mood and affect. Her behavior is normal.  Nursing note and vitals reviewed.  Blood pressure: Left arm/resting- 134/76  BP 144/94 mmHg   Pulse 89   Temp(Src) 98.5 F (36.9 C) (Oral)   Resp 16   Ht 5\' 6"  (1.676 m)   Wt 227 lb (102.967 kg)   BMI 36.66 kg/m2   SpO2 98% Results for orders placed or performed in visit on 11/04/14  POCT CBC  Result Value Ref Range   WBC 5.4 4.6 - 10.2 K/uL   Lymph, poc 2.7 0.6 - 3.4   POC LYMPH PERCENT 50.7 (A) 10 - 50 %L   MID (cbc) 0.4 0 - 0.9   POC MID % 8.1 0 - 12 %M   POC Granulocyte 2.2 2 - 6.9   Granulocyte percent 41.2 37 - 80 %G   RBC 4.13 4.04 - 5.48 M/uL   Hemoglobin 12.0 (A) 12.2 - 16.2 g/dL   HCT, POC 36.0 (A) 37.7 - 47.9 %   MCV 87.3 80 - 97 fL   MCH, POC 29.0 27 - 31.2 pg   MCHC 33.2 31.8 - 35.4 g/dL   RDW, POC 13.7 %   Platelet Count, POC 222 142 - 424 K/uL   MPV 8.5 0 - 99.8 fL   [facility] reading (PRIMARY) by  Dr. Joseph Art: CXR shows chronic scarring with elevated fissure on the right. Considerations include pulmonary fibrosis and sarcoidosis.        Assessment & Plan:   This chart was scribed in my presence and reviewed by me personally.    ICD-9-CM ICD-10-CM   1. Chest wall pain 786.52 R07.89 POCT CBC     COMPLETE METABOLIC PANEL WITH GFR     DG Chest 2 View     Sedimentation Rate     Ambulatory referral to Pulmonology     CANCELED: POCT SEDIMENTATION RATE  2. Essential hypertension 401.9 I10 amLODipine (NORVASC) 10 MG tablet     hydrochlorothiazide (MICROZIDE) 12.5 MG capsule  3. Vertigo 780.4 R42   4. Pain, dental 525.9 K08.89 penicillin v potassium (VEETID) 500 MG tablet  5. Abnormal chest x-ray 793.2 R93.8 Ambulatory referral to Pulmonology     Signed, [name], MD  By signing my name below, I, Rawaa Al Rifaie, attest that this documentation has been prepared under the direction and in the presence of [name], [place], Medical Scribe. 11/04/2014.  8:34 PM. This chart was scribed in my presence and reviewed by me personally.    ICD-9-CM ICD-10-CM   1. Chest wall pain 786.52 R07.89 POCT CBC     COMPLETE METABOLIC PANEL WITH GFR     DG Chest 2 View     Sedimentation Rate     CANCELED: POCT SEDIMENTATION RATE     Signed, [name], MD

## 2014-11-05 LAB — COMPLETE METABOLIC PANEL WITH GFR
ALT: 11 U/L (ref 6–29)
AST: 19 U/L (ref 10–35)
Albumin: 4.5 g/dL (ref 3.6–5.1)
Alkaline Phosphatase: 84 U/L (ref 33–130)
BUN: 19 mg/dL (ref 7–25)
CO2: 26 mmol/L (ref 20–31)
Calcium: 9.6 mg/dL (ref 8.6–10.4)
Chloride: 106 mmol/L (ref 98–110)
Creat: 0.69 mg/dL (ref 0.50–1.05)
GFR, Est African American: >89 mL/min (ref >=60)
GFR, Est Non African American: >89 mL/min (ref >=60)
Glucose, Bld: 85 mg/dL (ref 65–99)
Potassium: 3.9 mmol/L (ref 3.5–5.3)
Sodium: 140 mmol/L (ref 135–146)
Total Bilirubin: 0.5 mg/dL (ref 0.2–1.2)
Total Protein: 7.7 g/dL (ref 6.1–8.1)

## 2014-11-05 LAB — SEDIMENTATION RATE: Sed Rate: 25 mm/hr (ref 0–30)

## 2014-11-11 ENCOUNTER — Encounter: Payer: Self-pay | Admitting: Internal Medicine

## 2014-11-11 ENCOUNTER — Ambulatory Visit (INDEPENDENT_AMBULATORY_CARE_PROVIDER_SITE_OTHER): Payer: 59 | Admitting: Internal Medicine

## 2014-11-11 ENCOUNTER — Ambulatory Visit (INDEPENDENT_AMBULATORY_CARE_PROVIDER_SITE_OTHER): Payer: 59 | Admitting: Family Medicine

## 2014-11-11 ENCOUNTER — Encounter: Payer: Self-pay | Admitting: Family Medicine

## 2014-11-11 VITALS — BP 128/74 | HR 82 | Temp 98.1°F | Ht 65.35 in | Wt 225.0 lb

## 2014-11-11 VITALS — BP 136/82 | HR 75 | Ht 66.0 in | Wt 226.0 lb

## 2014-11-11 DIAGNOSIS — R0600 Dyspnea, unspecified: Secondary | ICD-10-CM

## 2014-11-11 DIAGNOSIS — R319 Hematuria, unspecified: Secondary | ICD-10-CM | POA: Diagnosis not present

## 2014-11-11 DIAGNOSIS — M542 Cervicalgia: Secondary | ICD-10-CM | POA: Diagnosis not present

## 2014-11-11 DIAGNOSIS — R079 Chest pain, unspecified: Secondary | ICD-10-CM

## 2014-11-11 DIAGNOSIS — R202 Paresthesia of skin: Secondary | ICD-10-CM

## 2014-11-11 DIAGNOSIS — R42 Dizziness and giddiness: Secondary | ICD-10-CM

## 2014-11-11 DIAGNOSIS — R002 Palpitations: Secondary | ICD-10-CM | POA: Diagnosis not present

## 2014-11-11 DIAGNOSIS — R938 Abnormal findings on diagnostic imaging of other specified body structures: Secondary | ICD-10-CM | POA: Diagnosis not present

## 2014-11-11 DIAGNOSIS — G43709 Chronic migraine without aura, not intractable, without status migrainosus: Secondary | ICD-10-CM

## 2014-11-11 DIAGNOSIS — R9389 Abnormal findings on diagnostic imaging of other specified body structures: Secondary | ICD-10-CM

## 2014-11-11 LAB — POCT URINALYSIS DIP (MANUAL ENTRY)
Bilirubin, UA: NEGATIVE
Blood, UA: NEGATIVE
Glucose, UA: NEGATIVE
Ketones, POC UA: NEGATIVE
Leukocytes, UA: NEGATIVE
Nitrite, UA: NEGATIVE
Protein Ur, POC: NEGATIVE
Spec Grav, UA: >=1.03
Urobilinogen, UA: 1
pH, UA: 5.5

## 2014-11-11 LAB — POC MICROSCOPIC URINALYSIS (UMFC)

## 2014-11-11 MED ORDER — FLUCONAZOLE 150 MG PO TABS: 150.0000 mg | ORAL_TABLET | Freq: Once | ORAL | Status: DC

## 2014-11-11 NOTE — Assessment & Plan Note (Signed)
Complicated by hbp  Body mass index is 36.49 kg/(m^2).  Lab Results  Component Value Date   TSH 0.880 08/31/2014     Contributing to   doe/reviewed the need and the process to achieve and maintain neg calorie balance > defer f/u primary care including intermittently monitoring thyroid status

## 2014-11-11 NOTE — Assessment & Plan Note (Signed)
-   PFT's 06/13/2011 no significant airflow obstruction, dlco 59 corrects to 121  - 11/11/2014  Walked RA x 3 laps @ 185 ft each stopped due to End of study, brisk pace, no sob or desat  But R rib pain (prev reported R ant upper chest pain with exertion but could not reproduce this one here and ekg neg at end)  Most likely ths is realted to obesity/ deconditioning with R rib pain with ex typical of IBS

## 2014-11-11 NOTE — Patient Instructions (Addendum)
Please see patient coordinator before you leave today  to schedule CT angiogram and I will call you with results

## 2014-11-11 NOTE — Progress Notes (Signed)
Subjective:     Patient ID: Brianna Hawkins, female   DOB: 1960/05/27,    MRN: [id number]  HPI   76 yobf quit smoking around 2003 with doe x around 2010 slowly worse then sinus problem since 2014 had surgery 07/2014 by Lucia Gaskins and cc chronic chest pains starting around 2013wax and wane/ multifocal prev eval in pulmonary clinic and improved on rx for IBS but worse x 09/2014 so referred back  to pulmonary clinic 11/11/2014  by Dr Joseph Art.    11/11/2014 1st [facility] office visit/ Tonianne Fine   Chief Complaint  Patient presents with  . Pulmonary Consult    Pt c/o chest wall/rib pain bilaterally with increasing pain when taking a deep breath and occasional SOB with exertion. Pt took a menstrual cramp reliever the other day and symptoms improved. Pt denies cough/wheeze/tightness.   first noted sev years ago both side of post/lat chest and also localiized just to R of sternum ant chest pains wax and wane and sometimes has one s the other R apical cp more related to exertion  Bilateral rib pain more related to supine position/ sometimes worse on R than L but usually equal bilaterally and worse with coughing also No longer taking rx for ibs which seemed to help     No obvious other patterns in day to day or daytime variabilty or assoc  chest tightness, subjective wheeze overt  hb symptoms. No unusual exp hx or h/o childhood pna/ asthma or knowledge of premature birth.  Sleeping ok without nocturnal  or early am exacerbation  of respiratory  c/o's or need for noct saba. Also denies any obvious fluctuation of symptoms with weather or environmental changes or other aggravating or alleviating factors except as outlined above   Current Medications, Allergies, Complete Past Medical History, Past Surgical History, Family History, and Social History were reviewed in [employer] [employer] record.             Review of Systems  Constitutional: Negative.  Negative for fever and unexpected  weight change.  HENT: Positive for congestion, dental problem, postnasal drip and rhinorrhea. Negative for ear pain, nosebleeds, sinus pressure, sneezing, sore throat and trouble swallowing.   Eyes: Positive for visual disturbance. Negative for redness and itching.  Respiratory: Positive for shortness of breath. Negative for cough, chest tightness and wheezing.   Cardiovascular: Negative.  Negative for palpitations and leg swelling.  Gastrointestinal: Negative.  Negative for nausea and vomiting.  Endocrine: Negative.   Genitourinary: Negative.  Negative for dysuria.  Musculoskeletal: Positive for arthralgias. Negative for joint swelling.  Skin: Negative.  Negative for rash.  Allergic/Immunologic: Positive for food allergies.  Neurological: Positive for headaches.  Hematological: Bruises/bleeds easily.  Psychiatric/Behavioral: Negative.  Negative for dysphoric mood. The patient is not nervous/anxious.        Objective:   Physical Exam  amb bf nad- extremely evasive historian with inconsistent responses to questions re multilocation cp   Wt Readings from Last 3 Encounters:  11/11/14 226 lb (102.513 kg)  11/04/14 227 lb (102.967 kg)  10/14/14 224 lb (101.606 kg)    Vital signs reviewed   HEENT: nl dentition, turbinates, and oropharynx. Nl external ear canals without cough reflex   NECK :  without JVD/Nodes/TM/ nl carotid upstrokes bilaterally   LUNGS: no acc muscle use, clear to A and P bilaterally without cough on insp or exp maneuvers   CV:  RRR  no s3 or murmur or increase in P2, no edema  ABD:  soft and nontender with nl excursion in the supine position. No bruits or organomegaly, bowel sounds nl  MS:  warm without deformities, calf tenderness, cyanosis or clubbing  SKIN: warm and dry without lesions    NEURO:  alert, approp, no deficits      I personally reviewed images and agree with radiology impression as follows:  CXR:   11/04/14 Right upper lobe presumed  scarring with volume loss within the right hemi thorax. No acute superimposed process or explanation for patient's symptoms.      Assessment:

## 2014-11-11 NOTE — Addendum Note (Signed)
Addended by: [name] on: 11/11/2014 02:34 PM   Modules accepted: Orders

## 2014-11-11 NOTE — Addendum Note (Signed)
Addended by: Constance Goltz on: 11/11/2014 02:34 PM   Modules accepted: Miquel Dunn

## 2014-11-11 NOTE — Assessment & Plan Note (Signed)
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 This suggests  obstruction of the bronchus, which can be related to scarring from  prior infection, congenital atresia or underlying tumor. At this  time, no centrally obstructing neoplasm is confidently identified,  suggesting against underlying neoplasm. Additional nodularity  throughout the right upper lobe (detailed above) is favored to be  of infectious or inflammatory etiology. However, overall  appearance is nonspecific, and attention on a short term follow-up  chest CT 2-3 months is recommended to ensure stability or  resolution of these findings.  2. Extensive air trapping in the right lower lobe anterobasal  Segment. - REC repeat CTa 11/15/14  I strongly doubt these chronic post TB changes explain any of her cp's which seem very atypical to me and after 20 minutes of extra hx taking I was convinced they are more likely to be atypical IBS symptoms after reviewing previous notes but will do CT chest first to sort out  I had an extended discussion with the patient reviewing all relevant studies completed to date and  lasting 35 minutes of a 60 minute visit    Each maintenance medication was reviewed in detail including most importantly the difference between maintenance and prns and under what circumstances the prns are to be triggered using an action plan format that is not reflected in the computer generated alphabetically organized AVS.    Please see instructions for details which were reviewed in writing and the patient given a copy highlighting the part that I personally wrote and discussed at today's ov.

## 2014-11-11 NOTE — Progress Notes (Addendum)
This chart was scribed for [name], MD by [name], medical scribe at [facility].The patient was seen in exam room 4 and the patient's care was started at 1:10 PM.  Patient ID: Brianna Hawkins MRN: [id number], DOB: 1960-09-01, 54 y.o. Date of Encounter: 11/11/2014  Primary Physician: [name], MD  Chief Complaint:  Chief Complaint  Patient presents with   Hand Pain    started Tues- on posterior side bilateral hands, radiating up to mid forearm   Neck Pain    right sided neck pain   Numbness    fingers x 1 year - comes and goes especially when asleep   Hematuria    1 week, observes blood clots in urine   Abdominal Pain    diffuse abd pains    HPI:  Brianna Hawkins is a 54 y.o. female who presents to [facility] complaining of multiple concerns.   Hand Pain She noticed both hands having pain on posterior side radiating up to her mid forearm while at work 2 days ago. Her hands fall asleep all the time at night, even if she doesn't lay on her hands. She denies any pain in her feet. Her hands feel very weak and numbness in her fingers, and she's noticed this going on for about a year now.   Neck Pain She also has right neck pain going towards her right shoulder.   Hematuria She noticed bleeding in her urine when she was going to the bathroom.   Lungs She saw Dr. Melvyn Novas for her lungs earlier today.   Past Medical History  Diagnosis Date   Urinary urgency    Pain, joint, shoulder region, right    Headache(784.0)    Hypertension    Allergy    Blood transfusion    GERD (gastroesophageal reflux disease)    Tuberculosis     7 YEARS AGO   Blood transfusion without reported diagnosis    Arthritis    Substance abuse    Heart murmur     very mild-echo 1/15-normal   Vertigo    Migraine      Home Meds: Prior to Admission medications   Medication Sig Start Date End Date Taking? Authorizing  Provider  albuterol (PROVENTIL HFA;VENTOLIN HFA) 108 (90 BASE) MCG/ACT inhaler Inhale 2 puffs into the lungs every 4 (four) hours as needed for wheezing or shortness of breath (cough, shortness of breath or wheezing.). Patient not taking: Reported on 11/11/2014 02/26/14   Araceli Bouche, PA  amLODipine (NORVASC) 10 MG tablet Take 1 tablet (10 mg total) by mouth daily. 11/04/14   [name], MD  aspirin-acetaminophen-caffeine (EXCEDRIN MIGRAINE) [phone] MG tablet Take by mouth as needed for headache.    Historical Provider, MD  esomeprazole (NEXIUM) 40 MG capsule TAKE 1 CAPSULE BY MOUTH DAILY BEFORE BREAKFAST. 09/09/14   [name], MD  fluticasone [facility]) 50 MCG/ACT nasal spray Place 2 sprays into both nostrils daily. 04/02/13   [name], PA-C  hydrochlorothiazide (MICROZIDE) 12.5 MG capsule Take 1 capsule (12.5 mg total) by mouth daily. 11/04/14   [name], MD  hydrOXYzine (ATARAX/VISTARIL) 25 MG tablet Take one half to one every 8 hours as needed for leg symptoms 07/08/14   [name], MD  LORazepam (ATIVAN) 1 MG tablet Take 1 tablet (1 mg total) by mouth at bedtime. 06/01/14   [name], MD  meclizine (ANTIVERT) 25 MG tablet Take 25 mg by mouth 3 (three)  times daily as needed for dizziness.    Historical Provider, MD  meloxicam (MOBIC) 7.5 MG tablet Take one daily for pain and inflammation in hip or shoulder. 07/08/14   [name], MD  Multiple Vitamins-Minerals (HAIR/SKIN/NAILS) TABS Take 1 tablet by mouth daily.    Historical Provider, MD  ondansetron (ZOFRAN) 4 MG tablet Take 1 tablet (4 mg total) by mouth every 8 (eight) hours as needed for nausea or vomiting. 06/01/14   [name], MD  penicillin v potassium (VEETID) 500 MG tablet Take 1 tablet (500 mg total) by mouth 3 (three) times daily. 11/04/14   [name], MD  rizatriptan (MAXALT-MLT) 10 MG disintegrating tablet Take 1 tablet (10 mg total) by mouth as needed. May repeat in 2 hours if needed 10/14/14    [name], MD  SUMAtriptan (IMITREX) 6 MG/0.5ML SOSY injection Inject 0.5 mLs (6 mg total) into the skin every 2 (two) hours as needed for migraine or headache (One injection at onset of headache/fim). F 10/14/14   [name], MD  traMADol (ULTRAM) 50 MG tablet Take 2 tablets (100 mg total) by mouth every 12 (twelve) hours as needed. 06/01/14   [name], MD  traZODone (DESYREL) 50 MG tablet Take 0.5-1 tablets (25-50 mg total) by mouth at bedtime as needed for sleep. 08/31/14   [name], MD  zolpidem (AMBIEN) 5 MG tablet Take one half to one pill at bedtime as needed for sleep 08/31/14   [name], MD    Allergies:  Allergies  Allergen Reactions   Banana Itching    Of throat   Tomato Rash    Social History   Social History   Marital Status: Single    Spouse Name: N/A   Number of Children: 4   Years of Education: HS   Occupational History   [employer]   Social History Main Topics   Smoking status: Former Smoker -- 0.30 packs/day for 15 years    Types: Cigarettes    Quit date: 01/01/2001   Smokeless tobacco: Never Used     Comment: "never inhaled"   Alcohol Use: No   Drug Use: No   Sexual Activity: Yes   Other Topics Concern   Not on file   Social History Narrative   Single. Education: [employer].   Right-handed.   1-2 cups caffeine per day.   Lives at home with her fiancee.        Review of Systems: Constitutional: negative for fever, chills, night sweats, weight changes, or fatigue  HEENT: negative for vision changes, hearing loss, congestion, rhinorrhea, ST, epistaxis, or sinus pressure Cardiovascular: negative for chest pain or palpitations Respiratory: negative for hemoptysis, wheezing, shortness of breath, or cough Abdominal: negative for nausea, vomiting, diarrhea, or constipation; positive for abd pain Dermatological: negative for rash Neurologic: negative for headache, dizziness; positive for numbness  (fingers) Musc: positive for myalgia (hands) neck pain GI: positive for hematuria All other systems reviewed and are otherwise negative with the exception to those above and in the HPI.  Physical Exam: Blood pressure 128/74, pulse 82, temperature 98.1 F (36.7 C), temperature source Oral, height 5' 5.35" (1.66 m), weight 225 lb (102.059 kg), last menstrual period 12/11/2013, SpO2 98 %., Body mass index is 37.04 kg/(m^2). General: Well developed, well nourished, in no acute distress. Head: Normocephalic, atraumatic, eyes without discharge, sclera non-icteric, nares are without discharge. Bilateral auditory canals clear, TM's are without perforation, pearly grey and translucent  with reflective cone of light bilaterally. Oral cavity moist, posterior pharynx without exudate, erythema, peritonsillar abscess, or post nasal drip.  Neck: Supple. No thyromegaly. Full ROM. No lymphadenopathy. Lungs: Clear bilaterally to auscultation without wheezes, rales, or rhonchi. Breathing is unlabored. Heart: RRR with S1 S2. No murmurs, rubs, or gallops appreciated. Abdomen: Soft, non-tender, non-distended with normoactive bowel sounds. No hepatomegaly. No rebound/guarding. No obvious abdominal masses. Msk:  Strength and tone normal for age. Extremities/Skin: Warm and dry. No clubbing or cyanosis. No edema. No rashes or suspicious lesions. Neuro: Alert and oriented X 3.  Psych:  Responds to questions appropriately with a normal affect.   Labs: Results for orders placed or performed in visit on 11/11/14  POCT urinalysis dipstick  Result Value Ref Range   Color, UA yellow yellow   Clarity, UA clear clear   Glucose, UA negative negative   Bilirubin, UA negative negative   Ketones, POC UA negative negative   Spec Grav, UA >=1.030    Blood, UA negative negative   pH, UA 5.5    Protein Ur, POC negative negative   Urobilinogen, UA 1.0    Nitrite, UA Negative Negative   Leukocytes, UA Negative Negative  POCT  Microscopic Urinalysis ([facility])  Result Value Ref Range   WBC,UR,HPF,POC None None WBC/hpf   RBC,UR,HPF,POC None None RBC/hpf   Bacteria None None, Too numerous to count   Mucus Present (A) Absent   Epithelial Cells, UR Per Microscopy Many (A) None, Too numerous to count cells/hpf   Pelvic exam:  Normal external female genitalia, normal urethra, normal vagina, normal cervix, normal bimanual with no evidence of bleeding anywhere.  ASSESSMENT AND PLAN:  54 y.o. year old female with  This chart was scribed in my presence and reviewed by me personally.    ICD-9-CM ICD-10-CM   1. Hematuria 599.70 R31.9 POCT urinalysis dipstick     POCT Microscopic Urinalysis ([facility])     Urine culture  2. Paresthesia of both hands 782.0 R20.2 Ambulatory referral to Neurology  3. Neck pain on right side 723.1 M54.2 Ambulatory referral to Neurology  4. Vertigo 780.4 R42   5. Dizziness and giddiness 780.4 R42   6. Chronic migraine without aura without status migrainosus, not intractable 346.70 G43.709     By signing my name below, I, [name], attest that this documentation has been prepared under the direction and in the presence of [name], MD. Electronically Signed: [name], Scribe. 11/11/2014 , 2:19 PM .  Signed, [name], MD 11/11/2014 2:19 PM    Note:  FMLA form completed during visit

## 2014-11-12 LAB — URINE CULTURE: Colony Count: 3000

## 2014-11-15 ENCOUNTER — Encounter (HOSPITAL_COMMUNITY): Payer: Self-pay

## 2014-11-15 ENCOUNTER — Ambulatory Visit (HOSPITAL_COMMUNITY)
Admission: RE | Admit: 2014-11-15 | Discharge: 2014-11-15 | Disposition: A | Discharge status: Home / Self Care | Payer: 59 | Source: Ambulatory Visit | Attending: Internal Medicine | Admitting: Internal Medicine

## 2014-11-15 DIAGNOSIS — R938 Abnormal findings on diagnostic imaging of other specified body structures: Secondary | ICD-10-CM | POA: Diagnosis present

## 2014-11-15 DIAGNOSIS — Z09 Encounter for follow-up examination after completed treatment for conditions other than malignant neoplasm: Secondary | ICD-10-CM | POA: Diagnosis not present

## 2014-11-15 DIAGNOSIS — M899 Disorder of bone, unspecified: Secondary | ICD-10-CM | POA: Insufficient documentation

## 2014-11-15 DIAGNOSIS — R590 Localized enlarged lymph nodes: Secondary | ICD-10-CM | POA: Insufficient documentation

## 2014-11-15 DIAGNOSIS — R9389 Abnormal findings on diagnostic imaging of other specified body structures: Secondary | ICD-10-CM

## 2014-11-15 MED ORDER — IOHEXOL 350 MG/ML SOLN
100.0000 mL | Freq: Once | INTRAVENOUS | Status: AC | PRN
  Administered 2014-11-15: 100 mL via INTRAVENOUS

## 2014-11-16 NOTE — Progress Notes (Signed)
Quick Note:  Contacted pt with results per MW Pt expressed understanding, nothing further needed ______ 

## 2014-11-18 ENCOUNTER — Ambulatory Visit (INDEPENDENT_AMBULATORY_CARE_PROVIDER_SITE_OTHER): Payer: 59 | Admitting: Physician Assistant

## 2014-11-18 VITALS — BP 122/80 | HR 64 | Temp 99.7°F | Resp 18 | Ht 66.5 in | Wt 225.0 lb

## 2014-11-18 DIAGNOSIS — R52 Pain, unspecified: Secondary | ICD-10-CM | POA: Diagnosis not present

## 2014-11-18 DIAGNOSIS — M791 Myalgia, unspecified site: Secondary | ICD-10-CM

## 2014-11-18 DIAGNOSIS — J069 Acute upper respiratory infection, unspecified: Secondary | ICD-10-CM

## 2014-11-18 DIAGNOSIS — R0981 Nasal congestion: Secondary | ICD-10-CM

## 2014-11-18 LAB — POCT INFLUENZA A/B
INFLUENZA A, POC: NEGATIVE
INFLUENZA B, POC: NEGATIVE

## 2014-11-18 MED ORDER — HYDROCOD POLST-CPM POLST ER 10-8 MG/5ML PO SUER: 5.0000 mL | Freq: Every evening | ORAL | Status: AC | PRN

## 2014-11-18 MED ORDER — IPRATROPIUM BROMIDE 0.03 % NA SOLN: 2.0000 | Freq: Two times a day (BID) | NASAL | Status: DC

## 2014-11-18 MED ORDER — BENZONATATE 100 MG PO CAPS: 100.0000 mg | ORAL_CAPSULE | Freq: Three times a day (TID) | ORAL | Status: DC | PRN

## 2014-11-18 MED ORDER — GUAIFENESIN ER 1200 MG PO TB12: 1.0000 | ORAL_TABLET | Freq: Two times a day (BID) | ORAL | Status: DC | PRN

## 2014-11-18 MED ORDER — AMOXICILLIN-POT CLAVULANATE 875-125 MG PO TABS: 1.0000 | ORAL_TABLET | Freq: Two times a day (BID) | ORAL | Status: AC

## 2014-11-18 NOTE — Patient Instructions (Signed)
Please hydrate well with over 64 oz per day.  Upper Respiratory Infection, Adult Most upper respiratory infections (URIs) are a viral infection of the air passages leading to the lungs. A URI affects the nose, throat, and upper air passages. The most common type of URI is nasopharyngitis and is typically referred to as "the common cold." URIs run their course and usually go away on their own. Most of the time, a URI does not require medical attention, but sometimes a bacterial infection in the upper airways can follow a viral infection. This is called a secondary infection. Sinus and middle ear infections are common types of secondary upper respiratory infections. Bacterial pneumonia can also complicate a URI. A URI can worsen asthma and chronic obstructive pulmonary disease (COPD). Sometimes, these complications can require emergency medical care and may be life threatening.  CAUSES Almost all URIs are caused by viruses. A virus is a type of germ and can spread from one person to another.  RISKS FACTORS You may be at risk for a URI if:   You smoke.   You have chronic heart or lung disease.  You have a weakened defense (immune) system.   You are very young or very old.   You have nasal allergies or asthma.  You work in crowded or poorly ventilated areas.  You work in health care facilities or schools. SIGNS AND SYMPTOMS  Symptoms typically develop 2-3 days after you come in contact with a cold virus. Most viral URIs last 7-10 days. However, viral URIs from the influenza virus (flu virus) can last 14-18 days and are typically more severe. Symptoms may include:   Runny or stuffy (congested) nose.   Sneezing.   Cough.   Sore throat.   Headache.   Fatigue.   Fever.   Loss of appetite.   Pain in your forehead, behind your eyes, and over your cheekbones (sinus pain).  Muscle aches.  DIAGNOSIS  Your health care provider may diagnose a URI by:  Physical  exam.  Tests to check that your symptoms are not due to another condition such as:  Strep throat.  Sinusitis.  Pneumonia.  Asthma. TREATMENT  A URI goes away on its own with time. It cannot be cured with medicines, but medicines may be prescribed or recommended to relieve symptoms. Medicines may help:  Reduce your fever.  Reduce your cough.  Relieve nasal congestion. HOME CARE INSTRUCTIONS   Take medicines only as directed by your health care provider.   Gargle warm saltwater or take cough drops to comfort your throat as directed by your health care provider.  Use a warm mist humidifier or inhale steam from a shower to increase air moisture. This may make it easier to breathe.  Drink enough fluid to keep your urine clear or pale yellow.   Eat soups and other clear broths and maintain good nutrition.   Rest as needed.   Return to work when your temperature has returned to normal or as your health care provider advises. You may need to stay home longer to avoid infecting others. You can also use a face mask and careful hand washing to prevent spread of the virus.  Increase the usage of your inhaler if you have asthma.   Do not use any tobacco products, including cigarettes, chewing tobacco, or electronic cigarettes. If you need help quitting, ask your health care provider. PREVENTION  The best way to protect yourself from getting a cold is to practice good hygiene.  Avoid oral or hand contact with people with cold symptoms.   Wash your hands often if contact occurs.  There is no clear evidence that vitamin C, vitamin E, echinacea, or exercise reduces the chance of developing a cold. However, it is always recommended to get plenty of rest, exercise, and practice good nutrition.  SEEK MEDICAL CARE IF:   You are getting worse rather than better.   Your symptoms are not controlled by medicine.   You have chills.  You have worsening shortness of breath.  You  have brown or red mucus.  You have yellow or brown nasal discharge.  You have pain in your face, especially when you bend forward.  You have a fever.  You have swollen neck glands.  You have pain while swallowing.  You have white areas in the back of your throat. SEEK IMMEDIATE MEDICAL CARE IF:   You have severe or persistent:  Headache.  Ear pain.  Sinus pain.  Chest pain.  You have chronic lung disease and any of the following:  Wheezing.  Prolonged cough.  Coughing up blood.  A change in your usual mucus.  You have a stiff neck.  You have changes in your:  Vision.  Hearing.  Thinking.  Mood. MAKE SURE YOU:   Understand these instructions.  Will watch your condition.  Will get help right away if you are not doing well or get worse.   This information is not intended to replace advice given to you by your health care provider. Make sure you discuss any questions you have with your health care provider.   Document Released: 06/13/2000 Document Revised: 05/04/2014 Document Reviewed: 03/25/2013 Elsevier Interactive Patient Education [employer].

## 2014-11-18 NOTE — Progress Notes (Signed)
[facility] [address] [place] [address] [phone]  Date:  11/18/2014   Name:  YALINA PEAGLER   DOB:  December 21, 1960   MRN:  [id number]  PCP:  [name], MD    History of Present Illness:  ANARIE CAL is a 54 y.o. female patient who presents to [facility] for cc of 2 days of sneezing, coughing, rhinorrhea, and myalgias.    2 days ago, had a lot of sneezing.  Next day runny nose, sneezing, and coughing.  She was very fatigue.  Some head pain secondary to sneezing.  Green mucus, sinus pressure.  Non-productive.  Fever was 99.6.  She has not done anything any for relief.   Sick contact with friend., back from the flu.  And coughing company.  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     Patient Active Problem List   Diagnosis Date Noted  . Chronic migraine without aura without status migrainosus, not intractable 11/11/2014  . Morbid obesity ([place]) 11/11/2014  . Chronic sinusitis 07/06/2013  . Palpitations 02/18/2012  . BMI 34.0-34.9,adult 08/02/2011  . Chest pain 04/21/2011  . Abnormal CXR 04/21/2011  . Dyspnea 04/21/2011  . Hypertension 02/08/2011  . Vertigo, benign positional 02/08/2011  . Insomnia 02/08/2011  . Headache(784.0) 02/07/2011    Past Medical History  Diagnosis Date  . Urinary urgency   . Pain, joint, shoulder region, right   . Headache(784.0)   . Hypertension   . Allergy   . Blood transfusion   . GERD (gastroesophageal reflux disease)   . Tuberculosis     7 YEARS AGO  . Blood transfusion without reported diagnosis   . Arthritis   . Substance abuse   . Heart murmur     very mild-echo 1/15-normal  . Vertigo   . Migraine     Past Surgical History  Procedure Laterality Date  . Tubal ligation    . Breast lumpectomy  2012    Underarm-rt-extra tissue  . Colonoscopy    . Upper gi endoscopy    . Ethmoidectomy Left 07/10/2013    Procedure: ETHMOIDECTOMY LEFT,MAXILLARY OSTIAL ENLARGEMENT WITH REMOVAL OF  DISEASE;  Surgeon: Rozetta Nunnery, MD;  Location: [place];  Service: ENT;  Laterality: Left;  . Turbinate reduction Left 07/10/2013    Procedure: LEFT TURBINATE REDUCTION;  Surgeon: Rozetta Nunnery, MD;  Location: [place];  Service: ENT;  Laterality: Left;  . Nose surgery      Social History  Substance Use Topics  . Smoking status: Former Smoker -- 0.30 packs/day for 15 years    Types: Cigarettes    Quit date: 01/01/2001  . Smokeless tobacco: Never Used     Comment: "never inhaled"  . Alcohol Use: No    Family History  Problem Relation Age of Onset  . Throat cancer Father     was a smoker  . Colon cancer Father   . Dementia Other   . HIV Sister   . Stroke Mother   . Diabetes Paternal Grandmother   . Diabetes Maternal Grandmother     Allergies  Allergen Reactions  . Banana Itching    Of throat  . Tomato Rash    Medication list has been reviewed and updated.  Current Outpatient Prescriptions on File Prior to Visit  Medication Sig Dispense Refill  . albuterol (PROVENTIL HFA;VENTOLIN HFA) 108 (90 BASE) MCG/ACT inhaler Inhale 2 puffs into the lungs every 4 (four) hours as  needed for wheezing or shortness of breath (cough, shortness of breath or wheezing.). 1 Inhaler 1  . amLODipine (NORVASC) 10 MG tablet Take 1 tablet (10 mg total) by mouth daily. 90 tablet 3  . aspirin-acetaminophen-caffeine (EXCEDRIN MIGRAINE) [id number] MG tablet Take by mouth as needed for headache.    . esomeprazole (NEXIUM) 40 MG capsule TAKE 1 CAPSULE BY MOUTH DAILY BEFORE BREAKFAST. 30 capsule 3  . fluconazole (DIFLUCAN) 150 MG tablet Take 1 tablet (150 mg total) by mouth once. 1 tablet 6  . fluticasone (FLONASE) 50 MCG/ACT nasal spray Place 2 sprays into both nostrils daily. 16 g 1  . hydrochlorothiazide (MICROZIDE) 12.5 MG capsule Take 1 capsule (12.5 mg total) by mouth daily. 90 capsule 3  . hydrOXYzine (ATARAX/VISTARIL) 25 MG tablet Take one half to  one every 8 hours as needed for leg symptoms 30 tablet 1  . LORazepam (ATIVAN) 1 MG tablet Take 1 tablet (1 mg total) by mouth at bedtime. 30 tablet 1  . meclizine (ANTIVERT) 25 MG tablet Take 25 mg by mouth 3 (three) times daily as needed for dizziness.    . meloxicam (MOBIC) 7.5 MG tablet Take one daily for pain and inflammation in hip or shoulder. 30 tablet 0  . Multiple Vitamins-Minerals (HAIR/SKIN/NAILS) TABS Take 1 tablet by mouth daily.    . ondansetron (ZOFRAN) 4 MG tablet Take 1 tablet (4 mg total) by mouth every 8 (eight) hours as needed for nausea or vomiting. 20 tablet 0  . rizatriptan (MAXALT-MLT) 10 MG disintegrating tablet Take 1 tablet (10 mg total) by mouth as needed. May repeat in 2 hours if needed 15 tablet 6  . SUMAtriptan (IMITREX) 6 MG/0.5ML SOSY injection Inject 0.5 mLs (6 mg total) into the skin every 2 (two) hours as needed for migraine or headache (One injection at onset of headache/fim). F 10 Syringe 6  . traMADol (ULTRAM) 50 MG tablet Take 2 tablets (100 mg total) by mouth every 12 (twelve) hours as needed. 30 tablet 0  . traZODone (DESYREL) 50 MG tablet Take 0.5-1 tablets (25-50 mg total) by mouth at bedtime as needed for sleep. 30 tablet 1  . zolpidem (AMBIEN) 5 MG tablet Take one half to one pill at bedtime as needed for sleep 30 tablet 1  . penicillin v potassium (VEETID) 500 MG tablet Take 1 tablet (500 mg total) by mouth 3 (three) times daily. (Patient not taking: Reported on 11/18/2014) 30 tablet 0   No current facility-administered medications on file prior to visit.    ROS ROS otherwise unremarkable unless listed above.  Physical Examination: BP 122/80 mmHg  Pulse 64  Temp(Src) 99.7 F (37.6 C) (Oral)  Resp 18  Ht 5' 6.5" (1.689 m)  Wt 225 lb (102.059 kg)  BMI 35.78 kg/m2  SpO2 95%  LMP 12/11/2013 Ideal Body Weight: Weight in (lb) to have BMI = 25: 156.9   Physical Exam  Constitutional: She is oriented to person, place, and time. She appears  well-developed and well-nourished. No distress.  HENT:  Head: Normocephalic and atraumatic.  Right Ear: Tympanic membrane, external ear and ear canal normal.  Left Ear: Tympanic membrane, external ear and ear canal normal.  Nose: Rhinorrhea (mucopurulent) present. No mucosal edema. Right sinus exhibits no maxillary sinus tenderness and no frontal sinus tenderness. Left sinus exhibits no maxillary sinus tenderness and no frontal sinus tenderness.  Mouth/Throat: No uvula swelling. No oropharyngeal exudate, posterior oropharyngeal edema or posterior oropharyngeal erythema.  Eyes: Conjunctivae and EOM are normal. Pupils  are equal, round, and reactive to light. Right eye exhibits no discharge. Left eye exhibits no discharge.  Cardiovascular: Normal rate and regular rhythm.  Exam reveals no gallop, no distant heart sounds and no friction rub.   No murmur heard. Pulses:      Radial pulses are 2+ on the right side, and 2+ on the left side.  Pulmonary/Chest: Effort normal and breath sounds normal. No respiratory distress. She has no decreased breath sounds. She has no wheezes. She has no rhonchi.  Lymphadenopathy:       Head (right side): No submandibular, no tonsillar, no preauricular and no posterior auricular adenopathy present.       Head (left side): No submandibular, no tonsillar, no preauricular and no posterior auricular adenopathy present.  Neurological: She is alert and oriented to person, place, and time.  Skin: She is not diaphoretic.  Psychiatric: She has a normal mood and affect. Her behavior is normal.   Results for orders placed or performed in visit on 11/18/14  POCT Influenza A/B  Result Value Ref Range   Influenza A, POC Negative Negative   Influenza B, POC Negative Negative     Assessment and Plan: JAICI CONROD is a 54 y.o. female who is here today for cough, sore throat, and myalgias. Likely viral.  Given print out of augmentin.  She will fill if she continues to have  symptoms after 7 days.  She will treat supportively at this time.  Acute upper respiratory infection - Plan: Guaifenesin (MUCINEX MAXIMUM STRENGTH) 1200 MG TB12, ipratropium (ATROVENT) 0.03 % nasal spray, benzonatate (TESSALON) 100 MG capsule, chlorpheniramine-HYDROcodone (TUSSIONEX PENNKINETIC ER) 10-8 MG/5ML SUER, amoxicillin-clavulanate (AUGMENTIN) 875-125 MG tablet  Myalgia - Plan: POCT Influenza A/B  Nasal congestion - Plan: POCT Influenza A/B  Body aches - Plan: POCT Influenza A/B     Ivar Drape, PA-C [facility] 11/18/2014 9:02 AM

## 2014-11-19 ENCOUNTER — Encounter: Payer: Self-pay | Admitting: Physician Assistant

## 2014-12-01 ENCOUNTER — Ambulatory Visit (INDEPENDENT_AMBULATORY_CARE_PROVIDER_SITE_OTHER): Payer: 59 | Admitting: Neurology

## 2014-12-01 ENCOUNTER — Encounter: Payer: Self-pay | Admitting: Neurology

## 2014-12-01 VITALS — BP 130/87 | HR 71 | Ht 66.5 in | Wt 224.0 lb

## 2014-12-01 DIAGNOSIS — R202 Paresthesia of skin: Secondary | ICD-10-CM

## 2014-12-01 NOTE — Progress Notes (Signed)
PATIENT: CYTLALY NORUM DOB: 1960/06/28  Chief Complaint  Patient presents with  . Numbness    She has developed bilateral numbness in hands about one year ago.  It has been tolerable until recently.  She is now having sharp, shooting pains in her right arm.  She has mild, right-sided neck pain.  . Migraine    Migraines are well controlled.     HISTORICAL  DONNAE DONNA is a 54 year old right-handed female, seen in refer by her primary care physician Dr.  [name] in October 14 2014 for evaluation of headaches.  She had a history of pulmonary tuberculosis was treated in 2007, hypertension, history of chronic sinus, status post sinus surgery  She reported a history of headaches since 2014, lateralized retro-orbital area severe pounding headache with associated noise sensitivity, nausea or vomiting, worsening by movement, blurry vision, can last for up to 3 days,  She was not able to identify any triggers, it happened about 1-2 times each months, in between, couple times a week, she also do with similar but much mild headaches, couple times a week, she has been taking Excedrin Migraine twice a week for mild headaches, Imitrex 100 mg tablets as needed for severe headaches, it works most of the time, but sometimes she has so much nausea vomiting, she can hold down her pills.  I have personally reviewed CAT scan in 2010, no intracranial abnormality  She also complains of blurry vision, difficulty focusing, no significant weight gain  UPDATE Dec 01 2014: Her migraines under good control now, she only has mild bilateral frontal headache, has not kept a chance to try her Imitrex shots yet. She now complains of bilateral hands pain, it started since 2015, initially it was bilateral hands paresthesia, now she has intermittent shooting pain from bilateral third and fourth fingertips to dorsum forearm, right worse than left, she denies significant weakness, she has mild right-sided  neck pain, she denies gait difficulties,   She also complains of insomnia, taking trazodone, Ambien as needed, which was helpful, but she complains of excessive drowsiness after medications  REVIEW OF SYSTEMS: Full 14 system review of systems performed and notable only for excessive sweating, ringing ears, runny nose, shortness of breath, chest pain, heat intolerance, insomnia, frequent awakening, joint pain, achy muscles, food allergy, dizziness, headaches, numbness  ALLERGIES: Allergies  Allergen Reactions  . Banana Itching    Of throat  . Tomato Rash    HOME MEDICATIONS: Current Outpatient Prescriptions  Medication Sig Dispense Refill  . albuterol (PROVENTIL HFA;VENTOLIN HFA) 108 (90 BASE) MCG/ACT inhaler Inhale 2 puffs into the lungs every 4 (four) hours as needed for wheezing or shortness of breath (cough, shortness of breath or wheezing.). 1 Inhaler 1  . amLODipine (NORVASC) 10 MG tablet Take 1 tablet (10 mg total) by mouth daily. 90 tablet 3  . aspirin-acetaminophen-caffeine (EXCEDRIN MIGRAINE) [id number] MG tablet Take by mouth as needed for headache.    . benzonatate (TESSALON) 100 MG capsule Take 1-2 capsules (100-200 mg total) by mouth 3 (three) times daily as needed for cough. 40 capsule 0  . esomeprazole (NEXIUM) 40 MG capsule TAKE 1 CAPSULE BY MOUTH DAILY BEFORE BREAKFAST. 30 capsule 3  . fluconazole (DIFLUCAN) 150 MG tablet Take 1 tablet (150 mg total) by mouth once. 1 tablet 6  . fluticasone (FLONASE) 50 MCG/ACT nasal spray Place 2 sprays into both nostrils daily. 16 g 1  . Guaifenesin (MUCINEX MAXIMUM STRENGTH) 1200 MG TB12 Take 1 tablet (  1,200 mg total) by mouth every 12 (twelve) hours as needed. 14 tablet 1  . hydrochlorothiazide (MICROZIDE) 12.5 MG capsule Take 1 capsule (12.5 mg total) by mouth daily. 90 capsule 3  . hydrOXYzine (ATARAX/VISTARIL) 25 MG tablet Take one half to one every 8 hours as needed for leg symptoms 30 tablet 1  . ipratropium (ATROVENT) 0.03 %  nasal spray Place 2 sprays into both nostrils 2 (two) times daily. 30 mL 0  . LORazepam (ATIVAN) 1 MG tablet Take 1 tablet (1 mg total) by mouth at bedtime. 30 tablet 1  . meclizine (ANTIVERT) 25 MG tablet Take 25 mg by mouth 3 (three) times daily as needed for dizziness.    . meloxicam (MOBIC) 7.5 MG tablet Take one daily for pain and inflammation in hip or shoulder. 30 tablet 0  . Multiple Vitamins-Minerals (HAIR/SKIN/NAILS) TABS Take 1 tablet by mouth daily.    . ondansetron (ZOFRAN) 4 MG tablet Take 1 tablet (4 mg total) by mouth every 8 (eight) hours as needed for nausea or vomiting. 20 tablet 0  . penicillin v potassium (VEETID) 500 MG tablet Take 1 tablet (500 mg total) by mouth 3 (three) times daily. 30 tablet 0  . rizatriptan (MAXALT-MLT) 10 MG disintegrating tablet Take 1 tablet (10 mg total) by mouth as needed. May repeat in 2 hours if needed 15 tablet 6  . SUMAtriptan (IMITREX) 6 MG/0.5ML SOSY injection Inject 0.5 mLs (6 mg total) into the skin every 2 (two) hours as needed for migraine or headache (One injection at onset of headache/fim). F 10 Syringe 6  . traMADol (ULTRAM) 50 MG tablet Take 2 tablets (100 mg total) by mouth every 12 (twelve) hours as needed. 30 tablet 0  . traZODone (DESYREL) 50 MG tablet Take 0.5-1 tablets (25-50 mg total) by mouth at bedtime as needed for sleep. 30 tablet 1  . zolpidem (AMBIEN) 5 MG tablet Take one half to one pill at bedtime as needed for sleep 30 tablet 1   No current facility-administered medications for this visit.    PAST MEDICAL HISTORY: Past Medical History  Diagnosis Date  . Urinary urgency   . Pain, joint, shoulder region, right   . Headache(784.0)   . Hypertension   . Allergy   . Blood transfusion   . GERD (gastroesophageal reflux disease)   . Tuberculosis In 2007  . Blood transfusion without reported diagnosis   . Arthritis   . Substance abuse   . Heart murmur     very mild-echo 1/15-normal  . Vertigo   . Migraine      PAST SURGICAL HISTORY: Past Surgical History  Procedure Laterality Date  . Tubal ligation    . Breast lumpectomy  2012    Underarm-rt-extra tissue  . Colonoscopy    . Upper gi endoscopy    . Ethmoidectomy Left 07/10/2013    Procedure: ETHMOIDECTOMY LEFT,MAXILLARY OSTIAL ENLARGEMENT WITH REMOVAL OF DISEASE;  Surgeon: Rozetta Nunnery, MD;  Location: [place];  Service: ENT;  Laterality: Left;  . Turbinate reduction Left 07/10/2013    Procedure: LEFT TURBINATE REDUCTION;  Surgeon: Rozetta Nunnery, MD;  Location: [place];  Service: ENT;  Laterality: Left;  . Nose surgery      FAMILY HISTORY: Family History  Problem Relation Age of Onset  . Throat cancer Father     was a smoker  . Colon cancer Father   . Dementia Other   . HIV Sister   . Stroke  Mother   . Diabetes Paternal Grandmother   . Diabetes Maternal Grandmother     SOCIAL HISTORY:  Social History   Social History  . Marital Status: Single    Spouse Name: N/A  . Number of Children: 4  . Years of Education: HS   Occupational History  . [employer]   Social History Main Topics  . Smoking status: Former Smoker -- 0.30 packs/day for 15 years    Types: Cigarettes    Quit date: 01/01/2001  . Smokeless tobacco: Never Used     Comment: "never inhaled"  . Alcohol Use: No  . Drug Use: No  . Sexual Activity: Yes   Other Topics Concern  . Not on file   Social History Narrative   Single. Education: [employer].   Right-handed.   1-2 cups caffeine per day.   Lives at home with her fiancee.        PHYSICAL EXAM   Filed Vitals:   12/01/14 0732  BP: 130/87  Pulse: 71  Height: 5' 6.5" (1.689 m)  Weight: 224 lb (101.606 kg)    Not recorded      Body mass index is 35.62 kg/(m^2).  PHYSICAL EXAMNIATION:  Gen: NAD, conversant, well nourised, obese, well groomed                     Cardiovascular: Regular rate rhythm, no peripheral edema, warm,  nontender. Eyes: Conjunctivae clear without exudates or hemorrhage Neck: Supple, no carotid bruise. Pulmonary: Clear to auscultation bilaterally   NEUROLOGICAL EXAM:  MENTAL STATUS: Speech:    Speech is normal; fluent and spontaneous with normal comprehension.  Cognition:     Orientation to time, place and person     Normal recent and remote memory     Normal Attention span and concentration     Normal Language, naming, repeating,spontaneous speech     Fund of knowledge   CRANIAL NERVES: CN II: Visual fields are full to confrontation. Fundoscopic exam is normal with sharp discs and no vascular changes. Pupils are round equal and briskly reactive to light. CN III, IV, VI: extraocular movement are normal. No ptosis. CN V: Facial sensation is intact to pinprick in all 3 divisions bilaterally. Corneal responses are intact.  CN VII: Face is symmetric with normal eye closure and smile. CN VIII: Hearing is normal to rubbing fingers CN IX, X: Palate elevates symmetrically. Phonation is normal. CN XI: Head turning and shoulder shrug are intact CN XII: Tongue is midline with normal movements and no atrophy.  MOTOR: There is no pronator drift of out-stretched arms. Muscle bulk and tone are normal. Muscle strength is normal.  REFLEXES: Reflexes are 2+ and symmetric at the biceps, triceps, knees, and ankles. Plantar responses are flexor.  SENSORY: Intact to light touch, pinprick, position sense, and vibration sense are intact in fingers and toes.  COORDINATION: Rapid alternating movements and fine finger movements are intact. There is no dysmetria on finger-to-nose and heel-knee-shin.    GAIT/STANCE: Posture is normal. Gait is steady with normal steps, base, arm swing, and turning. Heel and toe walking are normal. Tandem gait is normal.  Romberg is absent.   DIAGNOSTIC DATA (LABS, IMAGING, TESTING) - I reviewed patient records, labs, notes, testing and imaging myself where  available.   ASSESSMENT AND PLAN  JAYLI GUTTERMAN is a 54 y.o. female   Chronic migraine  Imitrex subcutaneous injection, Maxalt as needed  History of tuberculosis in 2007 Bilateral  hands paresthesia  Differentiation diagnosis including bilateral carpal tunnel syndromes, versus musculoskeletal etiology  Proceed with EMG nerve conduction study Insomnia  Continue trazodone 50 mg every day, Ambien 2.5 milligrams as needed, I also suggested melatonin  [name], M.D. Ph.D.  [facility] [address] [place], [place] [address] Ph: [phone] Fax: [phone]  SR:[id number] [place], MD

## 2014-12-01 NOTE — Patient Instructions (Signed)
Melatonin as needed for sleep

## 2014-12-07 ENCOUNTER — Ambulatory Visit: Payer: 59 | Admitting: Neurology

## 2014-12-23 ENCOUNTER — Ambulatory Visit (INDEPENDENT_AMBULATORY_CARE_PROVIDER_SITE_OTHER): Payer: 59 | Admitting: Family Medicine

## 2014-12-23 VITALS — BP 128/80 | HR 70 | Temp 98.5°F | Resp 16 | Ht 66.5 in | Wt 226.0 lb

## 2014-12-23 DIAGNOSIS — R938 Abnormal findings on diagnostic imaging of other specified body structures: Secondary | ICD-10-CM | POA: Diagnosis not present

## 2014-12-23 DIAGNOSIS — S29011A Strain of muscle and tendon of front wall of thorax, initial encounter: Secondary | ICD-10-CM

## 2014-12-23 DIAGNOSIS — R9389 Abnormal findings on diagnostic imaging of other specified body structures: Secondary | ICD-10-CM

## 2014-12-23 DIAGNOSIS — R0782 Intercostal pain: Secondary | ICD-10-CM | POA: Diagnosis not present

## 2014-12-23 DIAGNOSIS — I10 Essential (primary) hypertension: Secondary | ICD-10-CM

## 2014-12-23 LAB — POCT CBC
GRANULOCYTE PERCENT: 46.4 % (ref 37–80)
HEMATOCRIT: 33.1 % — AB (ref 37.7–47.9)
Hemoglobin: 11.1 g/dL — AB (ref 12.2–16.2)
Lymph, poc: 2.6 (ref 0.6–3.4)
MCH: 29.1 pg (ref 27–31.2)
MCHC: 33.5 g/dL (ref 31.8–35.4)
MCV: 86.9 fL (ref 80–97)
MID (CBC): 0.3 (ref 0–0.9)
MPV: 7.7 fL (ref 0–99.8)
POC GRANULOCYTE: 2.5 (ref 2–6.9)
POC LYMPH %: 48.3 % (ref 10–50)
POC MID %: 5.3 %M (ref 0–12)
Platelet Count, POC: 209 10*3/uL (ref 142–424)
RBC: 3.81 M/uL — AB (ref 4.04–5.48)
RDW, POC: 14.4 %
WBC: 5.3 10*3/uL (ref 4.6–10.2)

## 2014-12-23 NOTE — Progress Notes (Signed)
Subjective:    Patient ID: Brianna Hawkins, female    DOB: [birth year], 54 y.o.   MRN: [id number]  12/23/2014  Chest Pain   HPI This 54 y.o. female presents for evaluation of chest pain.  Onset three days ago.  L sided chest pain.  If doing something, pain just hits.  Not a sharp pain but a dull pain.  All day at work, yesterday and the day before.  Continues to tell coworkers that having chest pain.  Today picked up extra shift at work.  Not sure etiology.  Drinking stronger coffee every morning. Taking GERD medication.  Could be the coffee that is strong.  Pressure pain; does not lift up. Constant pressure and worsens with doing something. 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 Yesterday pulling two bags at one time.  Sometimes bothers patient when turning over in bed.  No radiation into neck or arm; no radiation into back.  No SOB; no nausea; no diahoresis.  Severity 8/10.  No medications for it.  Similar symptoms in the past.  Similar pain in past on R side.  Has been evaluated in past by Dr. Joseph Art and pulmonology; etiology unclear.  Has also been taking four Aleve for R shoulder pain; has no cartridge in shoulder; aches likes crazy;Aleve helping R shoulder pain.  No indigestion or heartburn.  Taking reflux medication at 4:30am or 5:00am because of abdominal pain with improvement.  No tobacco; quit smoking 15 years ago.  Pain does not worsen with inspiration or deep breathing. Previous stress testing in past; last stress test not sure.   Quit smoking 15 years ago; drinks one beer per day; family history of alcoholism. No exercise.  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  Constantly walking.   Uncle had AMI at 26.   No recent surgery or prolonged immobilization/traveling.  No calf pain; no leg swelling.  CT chest 11/15/14 negative for  pulmonary embolism or acute process.  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  Calcified mediastinal lymph node consistent with old granulomatous disease; air trapping in RLL and inferior aspect to RUL.  S/p CXR 11/04/14 for chest pain at that time; referred to pulmonology at that visit due to abnormal CXR and CT chest.  Evaluated on 11/11/14 by Dr. Melvyn Novas who noted that patient has been suffering with chronic chest pain since 2013.  Chest pain at that time R sided only.  Treated for possible TB in 2008 by [facility].     Review of Systems  Constitutional: Negative for fever, chills, diaphoresis and fatigue.  HENT: Negative for congestion, postnasal drip, rhinorrhea, sinus pressure and sore throat.   Respiratory: Negative for cough, shortness of breath, wheezing and stridor.   Cardiovascular: Positive for chest pain. Negative for palpitations and leg swelling.  Gastrointestinal: Negative for nausea, vomiting, abdominal pain and diarrhea.  Musculoskeletal: Negative for back pain.  Skin: Negative for rash.  Neurological: Negative for dizziness, light-headedness and headaches.    Past Medical History  Diagnosis Date  . Urinary urgency   . Pain, joint, shoulder region, right   . Headache(784.0)   . Hypertension   . Allergy   . Blood transfusion   . GERD (gastroesophageal reflux disease)   . Tuberculosis     7 YEARS AGO  . Blood transfusion without reported  diagnosis   . Arthritis   . Substance abuse   . Heart murmur     very mild-echo 1/15-normal  . Vertigo   . Migraine    Past Surgical History  Procedure Laterality Date  . Tubal ligation    . Breast lumpectomy  2012    Underarm-rt-extra tissue  . Colonoscopy    . Upper gi endoscopy    . Ethmoidectomy Left 07/10/2013    Procedure: ETHMOIDECTOMY LEFT,MAXILLARY OSTIAL ENLARGEMENT WITH REMOVAL OF DISEASE;  Surgeon: Rozetta Nunnery, MD;  Location: [facility];  Service: ENT;  Laterality:  Left;  . Turbinate reduction Left 07/10/2013    Procedure: LEFT TURBINATE REDUCTION;  Surgeon: Rozetta Nunnery, MD;  Location: [facility];  Service: ENT;  Laterality: Left;  . Nose surgery     Allergies  Allergen Reactions  . Banana Itching    Of throat  . Tomato Rash    Social History   Social History  . Marital Status: Single    Spouse Name: N/A  . Number of Children: 4  . Years of Education: HS   Occupational History  . [employer]   Social History Main Topics  . Smoking status: Former Smoker -- 0.30 packs/day for 15 years    Types: Cigarettes    Quit date: 01/01/2001  . Smokeless tobacco: Never Used     Comment: "never inhaled"  . Alcohol Use: 0.6 oz/week    0 Standard drinks or equivalent, 1 Glasses of wine per week  . Drug Use: No  . Sexual Activity: Yes   Other Topics Concern  . Not on file   Social History Narrative   Single. Education: [employer].   Right-handed.   1-2 cups caffeine per day.   Lives at home with her fiancee.      Family History  Problem Relation Age of Onset  . Throat cancer Father     was a smoker  . Colon cancer Father   . Alcohol abuse Father   . Cancer Father 68    colon cancer  . Dementia Other   . HIV Sister   . Alcohol abuse Mother   . Diabetes Mother   . Diabetes Paternal Grandmother   . Diabetes Maternal Grandmother        Objective:    BP 128/80 mmHg  Pulse 70  Temp(Src) 98.5 F (36.9 C) (Oral)  Resp 16  Ht 5' 6.5" (1.689 m)  Wt 226 lb (102.513 kg)  BMI 35.94 kg/m2  SpO2 97%  LMP 12/11/2013 Physical Exam  Constitutional: She is oriented to person, place, and time. She appears well-developed and well-nourished. No distress.  HENT:  Head: Normocephalic and atraumatic.  Right Ear: External ear normal.  Left Ear: External ear normal.  Nose: Nose normal.  Mouth/Throat: Oropharynx is clear and moist.  Eyes: Conjunctivae and EOM are normal. Pupils are equal, round, and  reactive to light.  Neck: Normal range of motion. Neck supple. Carotid bruit is not present. No thyromegaly present.  Cardiovascular: Normal rate, regular rhythm, normal heart sounds and intact distal pulses.  Exam reveals no gallop and no friction rub.   No murmur heard. Pulmonary/Chest: Effort normal and breath sounds normal. She has no wheezes. She has no rales. She exhibits tenderness.  +TTP L anterior chest wall with palpation which reproduces chest pain.  Abdominal: Soft. Bowel sounds are normal. She exhibits no distension and no mass. There is no tenderness. There is  no rebound and no guarding.  Musculoskeletal:       Right shoulder: She exhibits pain. She exhibits normal range of motion, no tenderness and no bony tenderness.       Left shoulder: Normal. She exhibits normal range of motion, no tenderness, no bony tenderness, no pain and no spasm.       Cervical back: She exhibits pain. She exhibits normal range of motion, no tenderness and no bony tenderness.       Arms: Cervical spine: non-tender midline; non-tender paraspinal regions B; full ROM cervical spine without limitation.  Motor 5/5 BUE.  Grip 5/5.  +pain in L anterior chest wall reproduced with range of motion on neck.   Lymphadenopathy:    She has no cervical adenopathy.  Neurological: She is alert and oriented to person, place, and time. No cranial nerve deficit.  Skin: Skin is warm and dry. No rash noted. She is not diaphoretic. No erythema. No pallor.  Psychiatric: She has a normal mood and affect. Her behavior is normal.    EKG: NSR; no acute ST changes.      Assessment & Plan:   1. Intercostal pain   2. Abnormal CXR   3. Essential hypertension   4. Chest wall muscle strain, initial encounter    -New. -Recommend rest, stretches, Aleve bid.  -Light duty at work; avoid excessive lifting/pulling/pushing.    Orders Placed This Encounter  Procedures  . Comprehensive metabolic panel  . POCT CBC  . EKG 12-Lead    No orders of the defined types were placed in this encounter.    Return if symptoms worsen or fail to improve.    Brianna Hawkins, M.D. [facility] [address] [place], [place]  [address] [phone] phone [phone] fax

## 2014-12-23 NOTE — Patient Instructions (Signed)

## 2014-12-24 LAB — COMPREHENSIVE METABOLIC PANEL
ALBUMIN: 4.1 g/dL (ref 3.6–5.1)
ALT: 11 U/L (ref 6–29)
AST: 18 U/L (ref 10–35)
Alkaline Phosphatase: 80 U/L (ref 33–130)
BILIRUBIN TOTAL: 0.5 mg/dL (ref 0.2–1.2)
BUN: 22 mg/dL (ref 7–25)
CO2: 27 mmol/L (ref 20–31)
CREATININE: 0.69 mg/dL (ref 0.50–1.05)
Calcium: 9.3 mg/dL (ref 8.6–10.4)
Chloride: 106 mmol/L (ref 98–110)
GLUCOSE: 81 mg/dL (ref 65–99)
Potassium: 4 mmol/L (ref 3.5–5.3)
SODIUM: 140 mmol/L (ref 135–146)
Total Protein: 6.8 g/dL (ref 6.1–8.1)

## 2014-12-29 ENCOUNTER — Ambulatory Visit (INDEPENDENT_AMBULATORY_CARE_PROVIDER_SITE_OTHER): Payer: Self-pay | Admitting: Neurology

## 2014-12-29 ENCOUNTER — Ambulatory Visit (INDEPENDENT_AMBULATORY_CARE_PROVIDER_SITE_OTHER): Payer: 59 | Admitting: Neurology

## 2014-12-29 DIAGNOSIS — R202 Paresthesia of skin: Secondary | ICD-10-CM

## 2014-12-29 DIAGNOSIS — Z0289 Encounter for other administrative examinations: Secondary | ICD-10-CM

## 2014-12-29 NOTE — Progress Notes (Signed)
Electrodiagnostic study is normal, there is no evidence of right upper extremity neuropathy or right cervical radiculopathy.

## 2014-12-29 NOTE — Procedures (Signed)
   NCS (NERVE CONDUCTION STUDY) WITH EMG (ELECTROMYOGRAPHY) REPORT   STUDY DATE: December 29 2014 PATIENT NAME: MORGANA BURROLA DOB: 1960/01/25 MRN: [id number]    TECHNOLOGIST: Laretta Alstrom ELECTROMYOGRAPHER: [name] M.D.  CLINICAL INFORMATION:   54 years old right-handed female presenting with intermittent bilateral hands numbness tingling and achy pain at nighttime  FINDINGS: NERVE CONDUCTION STUDY: Bilateral median, ulnar sensory and motor responses were normal  NEEDLE ELECTROMYOGRAPHY: Selected needle examination was performed at right upper extremity muscles and right cervical paraspinal muscles.  Needle examination of right pronator teres, flexor carpi ulnaris, biceps, deltoid, triceps was normal  There was no spontaneous activity at right cervical paraspinal muscles, right C5-C6 and C7.   IMPRESSION:  This is a normal study, there is no electrodiagnostic evidence of right upper extremity neuropathy or right cervical radiculopathy   INTERPRETING PHYSICIAN:   [name] M.D. Ph.D. [facility] [address] [place], [place] [address] [phone]

## 2015-01-07 MED FILL — ESOMEPRAZOLE MAG DR 40 MG C: 40 | 30 days supply | Qty: 30 | Fill #2

## 2015-01-11 ENCOUNTER — Telehealth: Payer: Self-pay

## 2015-01-18 ENCOUNTER — Telehealth: Payer: Self-pay | Admitting: *Deleted

## 2015-01-18 NOTE — Telephone Encounter (Signed)
Pt states that her chest pain/soreness is not as bad and did get better when she took a few days off from work

## 2015-01-18 NOTE — Telephone Encounter (Signed)
-----   Message from Wardell Honour, MD sent at 01/11/2015 11:06 AM EST ----- Call --- 1.  Sugar/glucose is normal.  2. Liver and kidney functions are normal.  3. Electrolytes (sodium and potassium levels) are normal.  How is chest pain/soreness?  Did it get better when she had a few days off of work?

## 2015-01-19 NOTE — Telephone Encounter (Signed)
Noted  

## 2015-02-07 MED FILL — HYDROCODON-APAP 5-325: 5-325 | 3 days supply | Qty: 15 | Fill #0

## 2015-02-20 ENCOUNTER — Ambulatory Visit (INDEPENDENT_AMBULATORY_CARE_PROVIDER_SITE_OTHER): Payer: 59

## 2015-02-20 ENCOUNTER — Ambulatory Visit (INDEPENDENT_AMBULATORY_CARE_PROVIDER_SITE_OTHER): Payer: 59 | Admitting: Family Medicine

## 2015-02-20 VITALS — BP 138/92 | HR 70 | Temp 98.6°F | Resp 18 | Ht 66.5 in | Wt 226.8 lb

## 2015-02-20 DIAGNOSIS — J0101 Acute recurrent maxillary sinusitis: Secondary | ICD-10-CM

## 2015-02-20 DIAGNOSIS — M542 Cervicalgia: Secondary | ICD-10-CM

## 2015-02-20 DIAGNOSIS — M25521 Pain in right elbow: Secondary | ICD-10-CM

## 2015-02-20 DIAGNOSIS — G43709 Chronic migraine without aura, not intractable, without status migrainosus: Secondary | ICD-10-CM | POA: Diagnosis not present

## 2015-02-20 DIAGNOSIS — I10 Essential (primary) hypertension: Secondary | ICD-10-CM | POA: Diagnosis not present

## 2015-02-20 DIAGNOSIS — Z1231 Encounter for screening mammogram for malignant neoplasm of breast: Secondary | ICD-10-CM

## 2015-02-20 DIAGNOSIS — M25511 Pain in right shoulder: Secondary | ICD-10-CM | POA: Diagnosis not present

## 2015-02-20 DIAGNOSIS — H6992 Unspecified Eustachian tube disorder, left ear: Secondary | ICD-10-CM

## 2015-02-20 DIAGNOSIS — K219 Gastro-esophageal reflux disease without esophagitis: Secondary | ICD-10-CM

## 2015-02-20 DIAGNOSIS — M50322 Other cervical disc degeneration at C5-C6 level: Secondary | ICD-10-CM | POA: Diagnosis not present

## 2015-02-20 MED ORDER — PREDNISONE 20 MG PO TABS: ORAL_TABLET | ORAL | Status: DC

## 2015-02-20 MED ORDER — AMLODIPINE BESYLATE 10 MG PO TABS: 10.0000 mg | ORAL_TABLET | Freq: Every day | ORAL | Status: DC

## 2015-02-20 MED ORDER — ESOMEPRAZOLE MAGNESIUM 40 MG PO CPDR: 40.0000 mg | DELAYED_RELEASE_CAPSULE | Freq: Every day | ORAL | Status: DC

## 2015-02-20 MED ORDER — HYDROCHLOROTHIAZIDE 12.5 MG PO CAPS: 12.5000 mg | ORAL_CAPSULE | Freq: Every day | ORAL | Status: DC

## 2015-02-20 MED ORDER — METHOCARBAMOL 500 MG PO TABS: 500.0000 mg | ORAL_TABLET | Freq: Every day | ORAL | Status: DC

## 2015-02-20 MED ORDER — AMOXICILLIN 500 MG PO TABS: 1000.0000 mg | ORAL_TABLET | Freq: Two times a day (BID) | ORAL | Status: DC

## 2015-02-20 MED ORDER — HYDROCODONE-ACETAMINOPHEN 5-325 MG PO TABS: 1.0000 | ORAL_TABLET | Freq: Four times a day (QID) | ORAL | Status: DC | PRN

## 2015-02-20 NOTE — Progress Notes (Signed)
Subjective:    Patient ID: Brianna Hawkins, female    DOB: 1960-10-13, 55 y.o.   MRN: [id number]  02/20/2015  Arm Pain; Ear Pain; and medication check   HPI This 55 y.o. female presents for the following:    1.  R shoulder/arm pain:  Taking Meloxicam for arm pain.  Keeps up all night.  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  Evaluated in July 2017 by Linna Darner; pain has worsened; now pain in R deltoid region and R elbow.  +associated neck pain.  S/p shoulder injection in 07/2014 without improvement.  No n/t/burning.  Pain at rest.    2. L ear pain:   If walks to couch to bedroom, the wind blowing into ear with pain. No tinnitus.  Normal hearing.  +nasal congestion.  +rhinorrhea.  Onset of pain one week ago.  Congestion started one week ago.  Bloody drainage.  Taking nasal spray Ipratropium.     3. HTN:  Patient reports good compliance with medication, good tolerance to medication, and good symptom control.    4.  Anemia:  Chronic; no longer has menses; s/p colonoscopy; chronic issue.  5.  Migraines:  Previous headache specialist; Imitrex injections very helpful.  One migraine per month.     6.  Insomnia: takes Tylenol PM; Trazodone qhs; Ambien 5mg  qhs.   7. GERD: Patient reports good compliance with medication, good tolerance to medication, and good symptom control.     Review of Systems  Constitutional: Negative for fever, chills, diaphoresis and fatigue.  Eyes: Negative for visual disturbance.  Respiratory: Negative for cough and shortness of breath.   Cardiovascular: Negative for chest pain, palpitations and leg swelling.  Gastrointestinal: Negative for nausea, vomiting, abdominal pain, diarrhea and constipation.  Endocrine: Negative for cold intolerance, heat intolerance, polydipsia, polyphagia and polyuria.  Neurological: Negative for dizziness, tremors, seizures, syncope, facial asymmetry, speech difficulty, weakness, light-headedness, numbness and headaches.     Past Medical History  Diagnosis Date  . Urinary urgency   . Pain, joint, shoulder region, right   . Headache(784.0)   . Hypertension   . Allergy   . Blood transfusion   . GERD (gastroesophageal reflux disease)   . Tuberculosis     7 YEARS AGO  . Blood transfusion without reported diagnosis   . Arthritis   . Substance abuse   . Heart murmur     very mild-echo 1/15-normal  . Vertigo   . Migraine    Past Surgical History  Procedure Laterality Date  . Tubal ligation    . Breast lumpectomy  2012    Underarm-rt-extra tissue  . Colonoscopy    . Upper gi endoscopy    . Ethmoidectomy Left 07/10/2013    Procedure: ETHMOIDECTOMY LEFT,MAXILLARY OSTIAL ENLARGEMENT WITH REMOVAL OF DISEASE;  Surgeon: Rozetta Nunnery, MD;  Location: [facility];  Service: ENT;  Laterality: Left;  . Turbinate reduction Left 07/10/2013    Procedure: LEFT TURBINATE REDUCTION;  Surgeon: Rozetta Nunnery, MD;  Location: [facility];  Service: ENT;  Laterality: Left;  . Nose surgery     Allergies  Allergen Reactions  . Banana Itching    Of throat  . Tomato Rash    Social History   Social History  . Marital Status: Single    Spouse Name: N/A  . Number of Children: 4  . Years of Education: HS   Occupational History  . [employer]  Social History Main Topics  . Smoking status: Former Smoker -- 0.30 packs/day for 15 years    Types: Cigarettes    Quit date: 01/01/2001  . Smokeless tobacco: Never Used     Comment: "never inhaled"  . Alcohol Use: 0.6 oz/week    0 Standard drinks or equivalent, 1 Glasses of wine per week  . Drug Use: No  . Sexual Activity: Yes   Other Topics Concern  . Not on file   Social History Narrative   Single. Education: [employer].   Right-handed.   1-2 cups caffeine per day.   Lives at home with her fiancee.      Family History  Problem Relation Age of Onset  . Throat cancer Father     was a smoker  .  Colon cancer Father   . Alcohol abuse Father   . Cancer Father 77    colon cancer  . Dementia Other   . HIV Sister   . Alcohol abuse Mother   . Diabetes Mother   . Diabetes Paternal Grandmother   . Diabetes Maternal Grandmother        Objective:    BP 138/92 mmHg  Pulse 70  Temp(Src) 98.6 F (37 C) (Oral)  Resp 18  Ht 5' 6.5" (1.689 m)  Wt 226 lb 12.8 oz (102.876 kg)  BMI 36.06 kg/m2  SpO2 99%  LMP 12/11/2013 Physical Exam  Constitutional: She is oriented to person, place, and time. She appears well-developed and well-nourished. No distress.  HENT:  Head: Normocephalic and atraumatic.  Right Ear: External ear normal.  Left Ear: External ear normal.  Nose: Nose normal.  Mouth/Throat: Oropharynx is clear and moist.  Eyes: Conjunctivae and EOM are normal. Pupils are equal, round, and reactive to light.  Neck: Normal range of motion. Neck supple. Carotid bruit is not present. No thyromegaly present.  Cardiovascular: Normal rate, regular rhythm, normal heart sounds and intact distal pulses.  Exam reveals no gallop and no friction rub.   No murmur heard. Pulmonary/Chest: Effort normal and breath sounds normal. She has no wheezes. She has no rales.  Abdominal: Soft. Bowel sounds are normal. She exhibits no distension and no mass. There is no tenderness. There is no rebound and no guarding.  Lymphadenopathy:    She has no cervical adenopathy.  Neurological: She is alert and oriented to person, place, and time. No cranial nerve deficit.  Skin: Skin is warm and dry. No rash noted. She is not diaphoretic. No erythema. No pallor.  Psychiatric: She has a normal mood and affect. Her behavior is normal.        Assessment & Plan:   1. Neck pain   2. Right elbow pain   3. Essential hypertension   4. Gastroesophageal reflux disease without esophagitis   5. Chronic migraine without aura without status migrainosus, not intractable   6. Pain in joint of right shoulder   7.  Eustachian tube disorder, left   8. Acute recurrent maxillary sinusitis   9. Encounter for screening mammogram for breast cancer     Orders Placed This Encounter  Procedures  . DG Cervical Spine Complete    Standing Status: Future     Number of Occurrences: 1     Standing Expiration Date: 02/20/2016    Order Specific Question:  Reason for Exam (SYMPTOM  OR DIAGNOSIS REQUIRED)    Answer:  neck pain R with radiation into R arm proximal    Order Specific Question:  Is the  patient pregnant?    Answer:  No    Order Specific Question:  Preferred imaging location?    Answer:  External  . DG Elbow Complete Right    Standing Status: Future     Number of Occurrences: 1     Standing Expiration Date: 02/20/2016    Order Specific Question:  Reason for Exam (SYMPTOM  OR DIAGNOSIS REQUIRED)    Answer:  R elbow pain medial    Order Specific Question:  Is the patient pregnant?    Answer:  No    Order Specific Question:  Preferred imaging location?    Answer:  External  . MM Digital Screening    ANNUAL/ PF: 02/09/2009 [place] [facility] / NO IMPLANTS/ NO HX BR CA/ NO PROBLEMS/ NO NEEDS/2D INS:  [facility]  [facility]/PT    Standing Status: Future     Number of Occurrences: 1     Standing Expiration Date: 04/19/2016    Order Specific Question:  Reason for Exam (SYMPTOM  OR DIAGNOSIS REQUIRED)    Answer:  screening mammogram    Order Specific Question:  Is the patient pregnant?    Answer:  No    Order Specific Question:  Preferred imaging location?    Answer:  [facility]  . Ambulatory referral to Orthopedic Surgery    Referral Priority:  Routine    Referral Type:  Surgical    Referral Reason:  Specialty Services Required    Requested Specialty:  Orthopedic Surgery    Number of Visits Requested:  1   Meds ordered this encounter  Medications  . DISCONTD: hydrochlorothiazide (MICROZIDE) 12.5 MG capsule    Sig: Take 1 capsule (12.5 mg total) by mouth daily.    Dispense:  90 capsule    Refill:  3  . DISCONTD:  amLODipine (NORVASC) 10 MG tablet    Sig: Take 1 tablet (10 mg total) by mouth daily.    Dispense:  90 tablet    Refill:  3  . DISCONTD: esomeprazole (NEXIUM) 40 MG capsule    Sig: Take 1 capsule (40 mg total) by mouth daily.    Dispense:  90 capsule    Refill:  3  . amoxicillin (AMOXIL) 500 MG tablet    Sig: Take 2 tablets (1,000 mg total) by mouth 2 (two) times daily.    Dispense:  40 tablet    Refill:  0  . predniSONE (DELTASONE) 20 MG tablet    Sig: Three tablets daily x 2 days then two tablets daily x 5 days then one tablet daily x 5 days    Dispense:  21 tablet    Refill:  0  . methocarbamol (ROBAXIN) 500 MG tablet    Sig: Take 1 tablet (500 mg total) by mouth at bedtime.    Dispense:  30 tablet    Refill:  0  . HYDROcodone-acetaminophen (NORCO/VICODIN) 5-325 MG tablet    Sig: Take 1 tablet by mouth every 6 (six) hours as needed for moderate pain.    Dispense:  30 tablet    Refill:  0  . amLODipine (NORVASC) 10 MG tablet    Sig: Take 1 tablet (10 mg total) by mouth daily.    Dispense:  90 tablet    Refill:  3  . esomeprazole (NEXIUM) 40 MG capsule    Sig: Take 1 capsule (40 mg total) by mouth daily.    Dispense:  90 capsule    Refill:  3  . hydrochlorothiazide (MICROZIDE) 12.5 MG capsule  Sig: Take 1 capsule (12.5 mg total) by mouth daily.    Dispense:  90 capsule    Refill:  3    No Follow-up on file.    Emanuella Nickle Elayne Guerin, M.D. [facility] [address] [place], [place]  [address] [phone] phone [phone] fax

## 2015-02-20 NOTE — Patient Instructions (Signed)

## 2015-02-21 MED FILL — AMLODIPINE BESYLATE 10 MG T: 10 | 90 days supply | Qty: 90 | Fill #0

## 2015-02-21 MED FILL — HYDROCHLOROTHIAZIDE 12.5 MG: 12.5 | 90 days supply | Qty: 90 | Fill #0

## 2015-02-21 MED FILL — SUMATRIPTAN 6 MG/0.5 ML REF: 6 | 30 days supply | Qty: 3 | Fill #0

## 2015-03-03 ENCOUNTER — Ambulatory Visit
Admission: RE | Admit: 2015-03-03 | Discharge: 2015-03-03 | Disposition: A | Discharge status: Home / Self Care | Payer: 59 | Source: Ambulatory Visit | Attending: Family Medicine | Admitting: Family Medicine

## 2015-03-03 ENCOUNTER — Encounter: Payer: Self-pay | Admitting: Neurology

## 2015-03-03 ENCOUNTER — Ambulatory Visit (INDEPENDENT_AMBULATORY_CARE_PROVIDER_SITE_OTHER): Payer: 59 | Admitting: Neurology

## 2015-03-03 VITALS — BP 132/92 | HR 73 | Ht 66.5 in | Wt 230.0 lb

## 2015-03-03 DIAGNOSIS — M25511 Pain in right shoulder: Secondary | ICD-10-CM | POA: Insufficient documentation

## 2015-03-03 DIAGNOSIS — Z1231 Encounter for screening mammogram for malignant neoplasm of breast: Secondary | ICD-10-CM | POA: Diagnosis not present

## 2015-03-03 DIAGNOSIS — G4700 Insomnia, unspecified: Secondary | ICD-10-CM

## 2015-03-03 DIAGNOSIS — G43709 Chronic migraine without aura, not intractable, without status migrainosus: Secondary | ICD-10-CM | POA: Diagnosis not present

## 2015-03-03 DIAGNOSIS — G8929 Other chronic pain: Secondary | ICD-10-CM | POA: Insufficient documentation

## 2015-03-03 MED ORDER — SUMATRIPTAN SUCCINATE 6 MG/0.5ML ~~LOC~~ SOSY: 6.0000 mg | PREFILLED_SYRINGE | SUBCUTANEOUS | Status: DC | PRN

## 2015-03-03 NOTE — Progress Notes (Signed)
PATIENT: Brianna Hawkins DOB: March 10, 1960  Chief Complaint  Patient presents with  . Numbness    She continues to have right hand numbness, right arm pain and neck pain.  She would like to discuss the next step in her treatment.  She has a pending appointment with ortho on 03/07/15.  . Migraine    Feels her migraines are well controlled.     HISTORICAL  Brianna Hawkins is a 55 year old right-handed female, seen in refer by her primary care physician Dr.  [name] in October 14 2014 for evaluation of headaches.  She had a history of pulmonary tuberculosis was treated in 2007, hypertension, history of chronic sinus, status post sinus surgery  She reported a history of headaches since 2014, lateralized retro-orbital area severe pounding headache with associated noise sensitivity, nausea or vomiting, worsening by movement, blurry vision, can last for up to 3 days,  She was not able to identify any triggers, it happened about 1-2 times each months, in between, couple times a week, she also do with similar but much mild headaches, couple times a week, she has been taking Excedrin Migraine twice a week for mild headaches, Imitrex 100 mg tablets as needed for severe headaches, it works most of the time, but sometimes she has so much nausea vomiting, she can hold down her pills.  I have personally reviewed CAT scan in 2010, no intracranial abnormality  She also complains of blurry vision, difficulty focusing, no significant weight gain  UPDATE Dec 01 2014: Her migraines under good control now, she only has mild bilateral frontal headache, has not had a chance to try her Imitrex shots yet. She now complains of bilateral hands pain, it started since 2015, initially it was bilateral hands paresthesia, now she has intermittent shooting pain from bilateral third and fourth fingertips to dorsum forearm, right worse than left, she denies significant weakness, she has mild right-sided neck  pain, she denies gait difficulties,   She also complains of insomnia, taking trazodone, Ambien as needed, which was helpful, but she complains of excessive drowsiness after medications  UPDATE March 2nd 2017: She complains of worsening right shoulder pain, was treated with prednisone tapering, with mild improvement, she has orthopedic appointment pending, she still noticed right hand paresthesia and pain. Her migraine has much improved, she has migraine headaches about once a month, she use Imitrex injection as needed, no significant side effect, EMG nerve conduction study was normal in December 2016, in specific, there is no evidence of right upper extremity neuropathy or right cervical radiculopathy  REVIEW OF SYSTEMS: Full 14 system review of systems performed and notable only for joint pain, ringing ears, double vision, blurry vision, leg swelling, back pain, achy muscles, neck pain, dizziness, headaches, numbness, confusion, hyperactivity  ALLERGIES: Allergies  Allergen Reactions  . Banana Itching    Of throat  . Tomato Rash    HOME MEDICATIONS: Current Outpatient Prescriptions  Medication Sig Dispense Refill  . albuterol (PROVENTIL HFA;VENTOLIN HFA) 108 (90 BASE) MCG/ACT inhaler Inhale 2 puffs into the lungs every 4 (four) hours as needed for wheezing or shortness of breath (cough, shortness of breath or wheezing.). 1 Inhaler 1  . amLODipine (NORVASC) 10 MG tablet Take 1 tablet (10 mg total) by mouth daily. 90 tablet 3  . amoxicillin (AMOXIL) 500 MG tablet Take 2 tablets (1,000 mg total) by mouth 2 (two) times daily. 40 tablet 0  . aspirin-acetaminophen-caffeine (EXCEDRIN MIGRAINE) 250-250-65 MG tablet Take by mouth as  needed for headache.    . esomeprazole (NEXIUM) 40 MG capsule Take 1 capsule (40 mg total) by mouth daily. 90 capsule 3  . fluticasone (FLONASE) 50 MCG/ACT nasal spray Place 2 sprays into both nostrils daily. 16 g 1  . hydrochlorothiazide (MICROZIDE) 12.5 MG capsule  Take 1 capsule (12.5 mg total) by mouth daily. 90 capsule 3  . HYDROcodone-acetaminophen (NORCO/VICODIN) 5-325 MG tablet Take 1 tablet by mouth every 6 (six) hours as needed for moderate pain. 30 tablet 0  . ipratropium (ATROVENT) 0.03 % nasal spray Place 2 sprays into both nostrils 2 (two) times daily. 30 mL 0  . LORazepam (ATIVAN) 1 MG tablet Take 1 tablet (1 mg total) by mouth at bedtime. 30 tablet 1  . meclizine (ANTIVERT) 25 MG tablet Take 25 mg by mouth 3 (three) times daily as needed for dizziness.    . meloxicam (MOBIC) 7.5 MG tablet Take one daily for pain and inflammation in hip or shoulder. (Patient not taking: Reported on 02/20/2015) 30 tablet 0  . methocarbamol (ROBAXIN) 500 MG tablet Take 1 tablet (500 mg total) by mouth at bedtime. 30 tablet 0  . Multiple Vitamins-Minerals (HAIR/SKIN/NAILS) TABS Take 1 tablet by mouth daily.    . ondansetron (ZOFRAN) 4 MG tablet Take 1 tablet (4 mg total) by mouth every 8 (eight) hours as needed for nausea or vomiting. 20 tablet 0  . predniSONE (DELTASONE) 20 MG tablet Three tablets daily x 2 days then two tablets daily x 5 days then one tablet daily x 5 days 21 tablet 0  . rizatriptan (MAXALT-MLT) 10 MG disintegrating tablet Take 1 tablet (10 mg total) by mouth as needed. May repeat in 2 hours if needed 15 tablet 6  . SUMAtriptan (IMITREX) 6 MG/0.5ML SOSY injection Inject 0.5 mLs (6 mg total) into the skin every 2 (two) hours as needed for migraine or headache (One injection at onset of headache/fim). F 10 Syringe 6  . traMADol (ULTRAM) 50 MG tablet Take 2 tablets (100 mg total) by mouth every 12 (twelve) hours as needed. 30 tablet 0  . traZODone (DESYREL) 50 MG tablet Take 0.5-1 tablets (25-50 mg total) by mouth at bedtime as needed for sleep. 30 tablet 1  . zolpidem (AMBIEN) 5 MG tablet Take one half to one pill at bedtime as needed for sleep 30 tablet 1   No current facility-administered medications for this visit.    PAST MEDICAL HISTORY: Past  Medical History  Diagnosis Date  . Urinary urgency   . Pain, joint, shoulder region, right   . Headache(784.0)   . Hypertension   . Allergy   . Blood transfusion   . GERD (gastroesophageal reflux disease)   . Tuberculosis In 2007  . Blood transfusion without reported diagnosis   . Arthritis   . Substance abuse   . Heart murmur     very mild-echo 1/15-normal  . Vertigo   . Migraine     PAST SURGICAL HISTORY: Past Surgical History  Procedure Laterality Date  . Tubal ligation    . Breast lumpectomy  2012    Underarm-rt-extra tissue  . Colonoscopy    . Upper gi endoscopy    . Ethmoidectomy Left 07/10/2013    Procedure: ETHMOIDECTOMY LEFT,MAXILLARY OSTIAL ENLARGEMENT WITH REMOVAL OF DISEASE;  Surgeon: Rozetta Nunnery, MD;  Location: [place];  Service: ENT;  Laterality: Left;  . Turbinate reduction Left 07/10/2013    Procedure: LEFT TURBINATE REDUCTION;  Surgeon: Rozetta Nunnery, MD;  Location: [place];  Service: ENT;  Laterality: Left;  . Nose surgery      FAMILY HISTORY: Family History  Problem Relation Age of Onset  . Throat cancer Father     was a smoker  . Colon cancer Father   . Alcohol abuse Father   . Cancer Father 32    colon cancer  . Dementia Other   . HIV Sister   . Alcohol abuse Mother   . Diabetes Mother   . Diabetes Paternal Grandmother   . Diabetes Maternal Grandmother     SOCIAL HISTORY:  Social History   Social History  . Marital Status: Single    Spouse Name: N/A  . Number of Children: 4  . Years of Education: HS   Occupational History  . [employer]   Social History Main Topics  . Smoking status: Former Smoker -- 0.30 packs/day for 15 years    Types: Cigarettes    Quit date: 01/01/2001  . Smokeless tobacco: Never Used     Comment: "never inhaled"  . Alcohol Use: 0.6 oz/week    0 Standard drinks or equivalent, 1 Glasses of wine per week  . Drug Use: No  . Sexual Activity:  Yes   Other Topics Concern  . Not on file   Social History Narrative   Single. Education: [employer].   Right-handed.   1-2 cups caffeine per day.   Lives at home with her fiancee.        PHYSICAL EXAM   There were no vitals filed for this visit.  Not recorded      There is no weight on file to calculate BMI.  PHYSICAL EXAMNIATION:  Gen: NAD, conversant, well nourised, obese, well groomed                     Cardiovascular: Regular rate rhythm, no peripheral edema, warm, nontender. Eyes: Conjunctivae clear without exudates or hemorrhage Neck: Supple, no carotid bruise. Pulmonary: Clear to auscultation bilaterally  Musculoskeletal: Right shoulder tenderness with movement, and deep palpitation  NEUROLOGICAL EXAM:  MENTAL STATUS: Speech:    Speech is normal; fluent and spontaneous with normal comprehension.  Cognition:     Orientation to time, place and person     Normal recent and remote memory     Normal Attention span and concentration     Normal Language, naming, repeating,spontaneous speech     Fund of knowledge   CRANIAL NERVES: CN II: Visual fields are full to confrontation. Fundoscopic exam is normal with sharp discs and no vascular changes. Pupils are round equal and briskly reactive to light. CN III, IV, VI: extraocular movement are normal. No ptosis. CN V: Facial sensation is intact to pinprick in all 3 divisions bilaterally. Corneal responses are intact.  CN VII: Face is symmetric with normal eye closure and smile. CN VIII: Hearing is normal to rubbing fingers CN IX, X: Palate elevates symmetrically. Phonation is normal. CN XI: Head turning and shoulder shrug are intact CN XII: Tongue is midline with normal movements and no atrophy.  MOTOR: There is no pronator drift of out-stretched arms. Muscle bulk and tone are normal. Muscle strength is normal.  REFLEXES: Reflexes are 2+ and symmetric at the biceps, triceps, knees, and ankles. Plantar responses  are flexor.  SENSORY: Intact to light touch, pinprick, position sense, and vibration sense are intact in fingers and toes.  COORDINATION: Rapid alternating movements and fine finger movements are intact.  There is no dysmetria on finger-to-nose and heel-knee-shin.    GAIT/STANCE: Posture is normal. Gait is steady with normal steps, base, arm swing, and turning. Heel and toe walking are normal. Tandem gait is normal.  Romberg is absent.   DIAGNOSTIC DATA (LABS, IMAGING, TESTING) - I reviewed patient records, labs, notes, testing and imaging myself where available.   ASSESSMENT AND PLAN  Brianna Hawkins is a 55 y.o. female   Chronic migraine  Imitrex subcutaneous injection for severe prolonged headaches  Maxalt as needed for moderate migraine  Right shoulder pain, right hand paresthesia,  Most consistent with right shoulder pathology, continue orthopedic evaluation  I also suggested hot compression  Insomnia  Continue trazodone 50 mg every day, Ambien 2.5 milligrams as needed, I also suggested melatonin  [name], M.D. Ph.D.  [facility] [address], [place], [place] [address] Ph: [phone] Fax: [phone]  AT:[id number] [place], MD

## 2015-03-07 DIAGNOSIS — M76821 Posterior tibial tendinitis, right leg: Secondary | ICD-10-CM | POA: Diagnosis not present

## 2015-03-07 DIAGNOSIS — M7541 Impingement syndrome of right shoulder: Secondary | ICD-10-CM | POA: Diagnosis not present

## 2015-03-08 ENCOUNTER — Telehealth: Payer: Self-pay

## 2015-03-08 NOTE — Telephone Encounter (Signed)
Patient brought in FMLA forms to be completed by Dr L- I have filled out what I can from her old forms and highlighted the areas that need to be completed. Please fill out and return to the FMLA/Disability box at the 102 check out desk with in 5-7 business days. I will place in your box on 03/08/15. Thank you!

## 2015-03-11 NOTE — Telephone Encounter (Signed)
Paperwork scanned and faxed on 03/11/15

## 2015-04-04 MED FILL — ESOMEPRAZOLE MAG DR 40 MG C: 40 | 90 days supply | Qty: 90 | Fill #0

## 2015-04-05 ENCOUNTER — Ambulatory Visit (INDEPENDENT_AMBULATORY_CARE_PROVIDER_SITE_OTHER): Payer: 59 | Admitting: Family Medicine

## 2015-04-05 ENCOUNTER — Ambulatory Visit (INDEPENDENT_AMBULATORY_CARE_PROVIDER_SITE_OTHER): Payer: 59

## 2015-04-05 VITALS — BP 138/82 | HR 69 | Temp 97.9°F | Resp 16 | Ht 66.25 in | Wt 227.6 lb

## 2015-04-05 DIAGNOSIS — M25511 Pain in right shoulder: Secondary | ICD-10-CM | POA: Diagnosis not present

## 2015-04-05 DIAGNOSIS — K5900 Constipation, unspecified: Secondary | ICD-10-CM

## 2015-04-05 DIAGNOSIS — J320 Chronic maxillary sinusitis: Secondary | ICD-10-CM

## 2015-04-05 DIAGNOSIS — R109 Unspecified abdominal pain: Secondary | ICD-10-CM | POA: Diagnosis not present

## 2015-04-05 DIAGNOSIS — J019 Acute sinusitis, unspecified: Secondary | ICD-10-CM | POA: Diagnosis not present

## 2015-04-05 DIAGNOSIS — J3489 Other specified disorders of nose and nasal sinuses: Secondary | ICD-10-CM | POA: Diagnosis not present

## 2015-04-05 DIAGNOSIS — R10A0 Flank pain, unspecified side: Secondary | ICD-10-CM

## 2015-04-05 LAB — POC MICROSCOPIC URINALYSIS (UMFC): MUCUS RE: ABSENT

## 2015-04-05 LAB — POCT CBC
Granulocyte percent: 44.3 %G (ref 37–80)
HEMATOCRIT: 35.2 % — AB (ref 37.7–47.9)
HEMOGLOBIN: 12.3 g/dL (ref 12.2–16.2)
LYMPH, POC: 2.3 (ref 0.6–3.4)
MCH, POC: 30.9 pg (ref 27–31.2)
MCHC: 35.1 g/dL (ref 31.8–35.4)
MCV: 88.1 fL (ref 80–97)
MID (cbc): 0.5 (ref 0–0.9)
MPV: 7.8 fL (ref 0–99.8)
POC GRANULOCYTE: 2.2 (ref 2–6.9)
POC LYMPH PERCENT: 46 %L (ref 10–50)
POC MID %: 9.7 % (ref 0–12)
Platelet Count, POC: 224 10*3/uL (ref 142–424)
RBC: 4 M/uL — AB (ref 4.04–5.48)
RDW, POC: 13.9 %
WBC: 5 10*3/uL (ref 4.6–10.2)

## 2015-04-05 LAB — BASIC METABOLIC PANEL
BUN: 15 mg/dL (ref 7–25)
CALCIUM: 9.5 mg/dL (ref 8.6–10.4)
CO2: 27 mmol/L (ref 20–31)
CREATININE: 0.59 mg/dL (ref 0.50–1.05)
Chloride: 106 mmol/L (ref 98–110)
GLUCOSE: 82 mg/dL (ref 65–99)
Potassium: 4.1 mmol/L (ref 3.5–5.3)
Sodium: 141 mmol/L (ref 135–146)

## 2015-04-05 LAB — POCT URINALYSIS DIP (MANUAL ENTRY)
BILIRUBIN UA: NEGATIVE
Bilirubin, UA: NEGATIVE
Blood, UA: NEGATIVE
GLUCOSE UA: NEGATIVE
LEUKOCYTES UA: NEGATIVE
Nitrite, UA: NEGATIVE
Protein Ur, POC: NEGATIVE
Spec Grav, UA: 1.02
Urobilinogen, UA: 1
pH, UA: 6.5

## 2015-04-05 MED ORDER — AMOXICILLIN-POT CLAVULANATE 875-125 MG PO TABS: 1.0000 | ORAL_TABLET | Freq: Two times a day (BID) | ORAL | Status: DC

## 2015-04-05 MED ORDER — FLUTICASONE PROPIONATE 50 MCG/ACT NA SUSP: 2.0000 | Freq: Every day | NASAL | Status: DC

## 2015-04-05 MED FILL — AMOX TR-K CLV 875-125 MG TA: 875-125 | 10 days supply | Qty: 20 | Fill #0

## 2015-04-05 MED FILL — FLUTICASONE PROP 50 MCG SPR: 50 | 60 days supply | Qty: 16 | Fill #0

## 2015-04-05 NOTE — Progress Notes (Signed)
Patient ID: Brianna Hawkins, female    DOB: 1960/10/07  Age: 55 y.o. MRN: [id number]  Chief Complaint  Patient presents with  . Headache  . Flank Pain    left side    Subjective:   Patient is here for several things today. She has been having pain in her left maxillary region. This is been going on for 2 weeks She blows thick green mucus from her nose and then sewn it forces upper comes out of her eye on the left. She hurts in the left maxillary sinus. 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 She does not smoke. She continues to have problems with pain in the right shoulder. Apparently last time she was injected there, though I cannot find the note in the chart regarding the injection. The injection did not help at all. The third problem is left flank pain for the last 2 weeks. She does have a history of kidney stones. Mild radiation around to the abdomen.  Current allergies, medications, problem list, past/family and social histories reviewed.  Objective:  BP 138/82 mmHg  Pulse 69  Temp(Src) 97.9 F (36.6 C) (Oral)  Resp 16  Ht 5' 6.25" (1.683 m)  Wt 227 lb 9.6 oz (103.239 kg)  BMI 36.45 kg/m2  SpO2 97%  LMP 12/11/2013  No major distress. Tender left maxillary sinus. Eyes look okay. Nose congested. Throat clear. Neck supple without nodes. Chest clear. Heart regular without murmurs. Left flank is mildly tender far laterally. Left upper quadrant of abdomen mildly tender far laterally.  Assessment & Plan:   Assessment: 1. Flank pain   2. Left maxillary sinusitis   3. Acute sinusitis, unspecified   4. Constipation, unspecified constipation type   5. Right shoulder pain       Plan: See instructions  Orders Placed This Encounter  Procedures  . 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    Answer:  No    Order Specific Question:  Preferred imaging location?    Answer:  External  . DG Abd 1 View    Order Specific Question:  Reason for Exam (SYMPTOM  OR DIAGNOSIS REQUIRED)    Answer:  left flank pain , history of kidney stones    Order Specific Question:  Is the patient pregnant?    Answer:  No    Order Specific Question:  Preferred imaging location?    Answer:  External  . Basic metabolic panel  . Ambulatory referral to Orthopedic Surgery    Referral Priority:  Routine    Referral Type:  Surgical    Referral Reason:  Specialty Services Required    Requested Specialty:  Orthopedic Surgery    Number of Visits Requested:  1  . POCT urinalysis dipstick  . POCT Microscopic Urinalysis ([facility])  . POCT CBC    Meds ordered this encounter  Medications  . amoxicillin-clavulanate (AUGMENTIN) 875-125 MG tablet    Sig: Take 1 tablet by mouth 2 (two) times daily.    Dispense:  20 tablet    Refill:  0  . fluticasone (FLONASE) 50 MCG/ACT nasal spray    Sig: Place 2 sprays into both nostrils daily.    Dispense:  16 g    Refill:  1   Sinus x-ray reveals left maxillary sinusitis  Abdominal x-ray does not reveal any obvious stones. There is excessive stool burden.  Radiology readings are still pending  Results for orders placed or performed in visit on 04/05/15  POCT urinalysis dipstick  Result Value Ref Range   Color, UA yellow yellow   Clarity, UA clear clear   Glucose, UA negative negative   Bilirubin, UA negative negative   Ketones, POC UA negative negative   Spec Grav, UA 1.020    Blood, UA negative negative   pH, UA 6.5    Protein Ur, POC negative negative   Urobilinogen, UA 1.0    Nitrite, UA Negative Negative   Leukocytes, UA Negative Negative  POCT Microscopic Urinalysis ([facility])  Result Value Ref Range   WBC,UR,HPF,POC None None WBC/hpf   RBC,UR,HPF,POC None None RBC/hpf   Bacteria Few (A) None, Too numerous to count   Mucus  Absent Absent   Epithelial Cells, UR Per Microscopy Few (A) None, Too numerous to count cells/hpf  POCT CBC  Result Value Ref Range   WBC 5.0 4.6 - 10.2 K/uL   Lymph, poc 2.3 0.6 - 3.4   POC LYMPH PERCENT 46.0 10 - 50 %L   MID (cbc) 0.5 0 - 0.9   POC MID % 9.7 0 - 12 %M   POC Granulocyte 2.2 2 - 6.9   Granulocyte percent 44.3 37 - 80 %G   RBC 4.00 (A) 4.04 - 5.48 M/uL   Hemoglobin 12.3 12.2 - 16.2 g/dL   HCT, POC 35.2 (A) 37.7 - 47.9 %   MCV 88.1 80 - 97 fL   MCH, POC 30.9 27 - 31.2 pg   MCHC 35.1 31.8 - 35.4 g/dL   RDW, POC 13.9 %   Platelet Count, POC 224 142 - 424 K/uL   MPV 7.8 0 - 99.8 fL   I suspect her symptoms are primarily from constipation causing the left flank pain and left quadrant pain. We need to treat her for sinusitis. Refer to orthopedics for the shoulder.      Patient Instructions   Referral is being made to orthopedics. If you do not hear regarding this within the next week, call back and talk to the referral's desk to be certain.  Take the Augmentin (amoxicillin/clavulanate) 875 mg one twice daily for the full 10 days. If you continue to have sinus symptoms after that we will need to refer you to an inner nose and throat doctor again.  Use the fluticasone nose spray 2 sprays in each nostril twice daily for 45 days, then decrease to once daily  Take the MiraLAX daily until the stools are loose, then back off and use it a couple of times a week or as needed. If the flank pain persists please return  Drink plenty of fluids  For the shoulder you can take Aleve 2 pills twice daily at breakfast and supper for a couple of weeks and see if that is helping some.  Return as necessary    IF you received an x-ray today, you will receive an invoice from [facility]. Please contact [facility] at [phone] with questions or concerns regarding your invoice.   IF you received labwork today, you will receive an invoice from [employer]. Please contact [facility] at [phone] with questions or concerns regarding your invoice.   Our billing staff will not be able to assist you with questions regarding bills from these companies.  You  will be contacted with the lab results as soon as they are available. The fastest way to get your results is to activate your My Chart account. Instructions are located on the last page of this paperwork. If you have not heard from [place] regarding the results in 2 weeks, please contact this office.         Return if symptoms worsen or fail to improve.   Leisl Spurrier, MD 04/05/2015

## 2015-04-05 NOTE — Patient Instructions (Addendum)
Referral is being made to orthopedics. If you do not hear regarding this within the next week, call back and talk to the referral's desk to be certain.  Take the Augmentin (amoxicillin/clavulanate) 875 mg one twice daily for the full 10 days. If you continue to have sinus symptoms after that we will need to refer you to an inner nose and throat doctor again.  Use the fluticasone nose spray 2 sprays in each nostril twice daily for 45 days, then decrease to once daily  Take the MiraLAX daily until the stools are loose, then back off and use it a couple of times a week or as needed. If the flank pain persists please return  Drink plenty of fluids  For the shoulder you can take Aleve 2 pills twice daily at breakfast and supper for a couple of weeks and see if that is helping some.  Return as necessary    IF you received an x-ray today, you will receive an invoice from [facility]. Please contact [facility] at [phone] with questions or concerns regarding your invoice.   IF you received labwork today, you will receive an invoice from [employer]. Please contact [facility] at [phone] with questions or concerns regarding your invoice.   Our billing staff will not be able to assist you with questions regarding bills from these companies.  You will be contacted with the lab results as soon as they are available. The fastest way to get your results is to activate your My Chart account. Instructions are located on the last page of this paperwork. If you have not heard from [place] regarding the results in 2 weeks, please contact this office.

## 2015-04-14 DIAGNOSIS — M5412 Radiculopathy, cervical region: Secondary | ICD-10-CM | POA: Diagnosis not present

## 2015-04-14 DIAGNOSIS — M50322 Other cervical disc degeneration at C5-C6 level: Secondary | ICD-10-CM | POA: Diagnosis not present

## 2015-04-14 DIAGNOSIS — M7501 Adhesive capsulitis of right shoulder: Secondary | ICD-10-CM | POA: Diagnosis not present

## 2015-04-14 DIAGNOSIS — M50321 Other cervical disc degeneration at C4-C5 level: Secondary | ICD-10-CM | POA: Diagnosis not present

## 2015-04-14 DIAGNOSIS — M792 Neuralgia and neuritis, unspecified: Secondary | ICD-10-CM | POA: Diagnosis not present

## 2015-04-14 DIAGNOSIS — M542 Cervicalgia: Secondary | ICD-10-CM | POA: Diagnosis not present

## 2015-04-14 DIAGNOSIS — M25511 Pain in right shoulder: Secondary | ICD-10-CM | POA: Diagnosis not present

## 2015-04-14 MED FILL — predniSONE 10 MG TABS: 10 | 12 days supply | Qty: 42 | Fill #0

## 2015-04-28 DIAGNOSIS — M2242 Chondromalacia patellae, left knee: Secondary | ICD-10-CM | POA: Diagnosis not present

## 2015-04-28 DIAGNOSIS — M25562 Pain in left knee: Secondary | ICD-10-CM | POA: Diagnosis not present

## 2015-04-28 DIAGNOSIS — M1712 Unilateral primary osteoarthritis, left knee: Secondary | ICD-10-CM | POA: Diagnosis not present

## 2015-04-28 DIAGNOSIS — M7122 Synovial cyst of popliteal space [Baker], left knee: Secondary | ICD-10-CM | POA: Diagnosis not present

## 2015-05-12 DIAGNOSIS — M5412 Radiculopathy, cervical region: Secondary | ICD-10-CM | POA: Diagnosis not present

## 2015-05-16 DIAGNOSIS — M5412 Radiculopathy, cervical region: Secondary | ICD-10-CM | POA: Diagnosis not present

## 2015-05-16 DIAGNOSIS — M2242 Chondromalacia patellae, left knee: Secondary | ICD-10-CM | POA: Diagnosis not present

## 2015-06-01 MED FILL — AMOXICILLIN 875 MG TABLET: 875 | 10 days supply | Qty: 20 | Fill #0

## 2015-06-13 ENCOUNTER — Telehealth: Payer: Self-pay

## 2015-06-13 NOTE — Telephone Encounter (Signed)
Patient needs FMLA forms signed by Dr L. I have completed the form based off her last FMLA forms. I will place them in your box on 06/13/15 if you could please return them to the FMLA/Disability box at the 102 check out desk within 5-7 business days. Thank you!

## 2015-06-14 NOTE — Telephone Encounter (Signed)
Forms completed and faxed to [employer] on 06/14/15

## 2015-06-17 DIAGNOSIS — Z0271 Encounter for disability determination: Secondary | ICD-10-CM

## 2015-07-11 ENCOUNTER — Ambulatory Visit (INDEPENDENT_AMBULATORY_CARE_PROVIDER_SITE_OTHER): Payer: 59 | Admitting: Family Medicine

## 2015-07-11 VITALS — BP 126/84 | HR 75 | Temp 98.8°F | Resp 16 | Ht 66.0 in | Wt 226.0 lb

## 2015-07-11 DIAGNOSIS — R350 Frequency of micturition: Secondary | ICD-10-CM | POA: Diagnosis not present

## 2015-07-11 DIAGNOSIS — H52221 Regular astigmatism, right eye: Secondary | ICD-10-CM | POA: Diagnosis not present

## 2015-07-11 DIAGNOSIS — G479 Sleep disorder, unspecified: Secondary | ICD-10-CM

## 2015-07-11 DIAGNOSIS — H524 Presbyopia: Secondary | ICD-10-CM | POA: Diagnosis not present

## 2015-07-11 DIAGNOSIS — R51 Headache: Secondary | ICD-10-CM | POA: Diagnosis not present

## 2015-07-11 DIAGNOSIS — N3281 Overactive bladder: Secondary | ICD-10-CM

## 2015-07-11 DIAGNOSIS — R519 Headache, unspecified: Secondary | ICD-10-CM

## 2015-07-11 DIAGNOSIS — G43809 Other migraine, not intractable, without status migrainosus: Secondary | ICD-10-CM

## 2015-07-11 DIAGNOSIS — J019 Acute sinusitis, unspecified: Secondary | ICD-10-CM

## 2015-07-11 DIAGNOSIS — H5201 Hypermetropia, right eye: Secondary | ICD-10-CM | POA: Diagnosis not present

## 2015-07-11 LAB — POC MICROSCOPIC URINALYSIS (UMFC)

## 2015-07-11 LAB — POCT URINALYSIS DIP (MANUAL ENTRY)
Blood, UA: NEGATIVE
GLUCOSE UA: NEGATIVE
LEUKOCYTES UA: NEGATIVE
NITRITE UA: NEGATIVE
PH UA: 5.5
PROTEIN UA: NEGATIVE
Spec Grav, UA: >=1.03
Urobilinogen, UA: 1

## 2015-07-11 LAB — GLUCOSE, POCT (MANUAL RESULT ENTRY): POC GLUCOSE: 105 mg/dL — AB (ref 70–99)

## 2015-07-11 MED ORDER — BUTALBITAL-APAP-CAFFEINE 50-325-40 MG PO TABS: 1.0000 | ORAL_TABLET | Freq: Four times a day (QID) | ORAL | Status: AC | PRN

## 2015-07-11 MED ORDER — TRAZODONE HCL 50 MG PO TABS: 25.0000 mg | ORAL_TABLET | Freq: Every evening | ORAL | Status: DC | PRN

## 2015-07-11 MED ORDER — AMOXICILLIN-POT CLAVULANATE 875-125 MG PO TABS: 1.0000 | ORAL_TABLET | Freq: Two times a day (BID) | ORAL | Status: DC

## 2015-07-11 MED ORDER — OXYBUTYNIN CHLORIDE ER 5 MG PO TB24: ORAL_TABLET | ORAL | Status: DC

## 2015-07-11 MED ORDER — ZOLPIDEM TARTRATE 5 MG PO TABS: ORAL_TABLET | ORAL | Status: DC

## 2015-07-11 MED FILL — OXYBUTYNIN CL ER 5 MG TAB: 5 | 15 days supply | Qty: 30 | Fill #0

## 2015-07-11 MED FILL — AMOX TR-K CLV 875-125 MG TA: 875-125 | 10 days supply | Qty: 20 | Fill #0

## 2015-07-11 MED FILL — ZOLPIDEM TARTRATE 5 MG TAB: 5 | 30 days supply | Qty: 30 | Fill #0

## 2015-07-11 MED FILL — SUMATRIPTAN 6 MG/0.5 ML REF: 6 | 30 days supply | Qty: 3 | Fill #1

## 2015-07-11 MED FILL — BUTALB-ACETAMIN-CAFF 50-325: 50-325-40 | 4 days supply | Qty: 30 | Fill #0

## 2015-07-11 MED FILL — traZODone HCL 50 MG TABS: 50 | 30 days supply | Qty: 30 | Fill #0

## 2015-07-11 NOTE — Progress Notes (Signed)
Patient ID: JIANNAH MAPES, female    DOB: 04/19/1960  Age: 55 y.o. MRN: [id number]  Chief Complaint  Patient presents with  . Headache    left sided, since Friday, getting worse    Subjective:   Patient is here with several things going on. She has a history of migraines. She's been having a bad left-sided headache since Friday. She has not used her Imitrex. She took some Excedrin. She has not been dizzy though she does have a history of dizziness in the past. She had a long list of medications which I tried to clean up. She has high blood pressure, which she takes her medicine faithfully. She has been having a lot of urinary frequency. She has been tried on medication for this in the past. Is been bothering her especially a lot the last couple of weeks. Current allergies, medications, problem list, past/family and social histories reviewed.  Objective:  BP 126/84 mmHg  Pulse 75  Temp(Src) 98.8 F (37.1 C)  Resp 16  Ht 5\' 6"  (1.676 m)  Wt 226 lb (102.513 kg)  BMI 36.49 kg/m2  SpO2 97%  LMP 12/11/2013  No major acute distress. TMs normal. Eyes PERRLA. Throat clear. Neck supple without significant nodes. Chest is clear to auscultation. Heart regular without murmur. No ankle edema. No CVA tenderness. Abdomen soft and nontender. No flank tenderness.  Assessment & Plan:   Assessment: 1. Sleep disturbance   2. Acute sinusitis, unspecified   3. Frequent headaches   4. Other migraine without status migrainosus, not intractable   5. Urinary frequency   6. Overactive bladder       Plan: Check urinalysis to make sure that a UTIs not causing her headaches also. Results for orders placed or performed in visit on 07/11/15  POCT Microscopic Urinalysis ([facility])  Result Value Ref Range   WBC,UR,HPF,POC Few (A) None WBC/hpf   RBC,UR,HPF,POC None None RBC/hpf   Bacteria Few (A) None, Too numerous to count   Mucus Present (A) Absent   Epithelial Cells, UR Per Microscopy Many (A) None,  Too numerous to count cells/hpf  POCT glucose (manual entry)  Result Value Ref Range   POC Glucose 105 (A) 70 - 99 mg/dl  POCT urinalysis dipstick  Result Value Ref Range   Color, UA yellow yellow   Clarity, UA clear clear   Glucose, UA negative negative   Bilirubin, UA small (A) negative   Ketones, POC UA trace (5) (A) negative   Spec Grav, UA >=1.030    Blood, UA negative negative   pH, UA 5.5    Protein Ur, POC negative negative   Urobilinogen, UA 1.0    Nitrite, UA Negative Negative   Leukocytes, UA Negative Negative    Orders Placed This Encounter  Procedures  . POCT Microscopic Urinalysis ([facility])  . POCT glucose (manual entry)  . POCT urinalysis dipstick    Meds ordered this encounter  Medications  . zolpidem (AMBIEN) 5 MG tablet    Sig: Take one half to one pill at bedtime as needed for sleep    Dispense:  30 tablet    Refill:  1  . traZODone (DESYREL) 50 MG tablet    Sig: Take 0.5-1 tablets (25-50 mg total) by mouth at bedtime as needed for sleep.    Dispense:  30 tablet    Refill:  1  . amoxicillin-clavulanate (AUGMENTIN) 875-125 MG tablet    Sig: Take 1 tablet by mouth 2 (two) times daily.  Dispense:  20 tablet    Refill:  0  . butalbital-acetaminophen-caffeine (FIORICET) 50-325-40 MG tablet    Sig: Take 1-2 tablets by mouth every 6 (six) hours as needed for headache.    Dispense:  30 tablet    Refill:  0  . oxybutynin (DITROPAN XL) 5 MG 24 hr tablet    Sig: Take 1-2 tablets at bedtime to decrease urinary frequency    Dispense:  30 tablet    Refill:  2         Patient Instructions   Take the generic Fioricet (butalbital/acetominophen/caffeine) 1-2 pills every 6 hours only if needed for headache  Take Augmentin (amoxicillin/clavulanate) one twice daily for sinusitis  Use the trazodone 50 mg 1 at bedtime for sleep. If necessary, in addition to the trazodone, take zolpidem (Ambien) 1 at bedtime  Continue your other medications  Take Ditropan  XL 5 mg 1 at bedtime for urinary frequency. If problems persist can increase to 2 at bedtime.  Drink sufficient amounts of water  Return as needed    IF you received an x-ray today, you will receive an invoice from [facility]. Please contact [facility] at [phone] with questions or concerns regarding your invoice.   IF you received labwork today, you will receive an invoice from [employer]. Please contact [facility] at [phone] with questions or concerns regarding your invoice.   Our billing staff will not be able to assist you with questions regarding bills from these companies.  You will be contacted with the lab results as soon as they are available. The fastest way to get your results is to activate your My Chart account. Instructions are located on the last page of this paperwork. If you have not heard from [place] regarding the results in 2 weeks, please contact this office.          Return if symptoms worsen or fail to improve.   HOPPER,DAVID, MD 07/11/2015

## 2015-07-11 NOTE — Patient Instructions (Addendum)
Take the generic Fioricet (butalbital/acetominophen/caffeine) 1-2 pills every 6 hours only if needed for headache  Take Augmentin (amoxicillin/clavulanate) one twice daily for sinusitis  Use the trazodone 50 mg 1 at bedtime for sleep. If necessary, in addition to the trazodone, take zolpidem (Ambien) 1 at bedtime  Continue your other medications  Take Ditropan XL 5 mg 1 at bedtime for urinary frequency. If problems persist can increase to 2 at bedtime.  Drink sufficient amounts of water  Return as needed    IF you received an x-ray today, you will receive an invoice from [facility]. Please contact [facility] at [phone] with questions or concerns regarding your invoice.   IF you received labwork today, you will receive an invoice from [employer]. Please contact [facility] at [phone] with questions or concerns regarding your invoice.   Our billing staff will not be able to assist you with questions regarding bills from these companies.  You will be contacted with the lab results as soon as they are available. The fastest way to get your results is to activate your My Chart account. Instructions are located on the last page of this paperwork. If you have not heard from [place] regarding the results in 2 weeks, please contact this office.

## 2015-07-15 MED FILL — ESOMEPRAZOLE MAG DR 40 MG C: 40 | 90 days supply | Qty: 90 | Fill #1

## 2015-08-05 ENCOUNTER — Other Ambulatory Visit: Payer: Self-pay | Admitting: Physician Assistant

## 2015-08-05 MED FILL — HYDROCHLOROTHIAZIDE 12.5 MG: 12.5 | 90 days supply | Qty: 90 | Fill #1

## 2015-08-05 MED FILL — AMLODIPINE BESYLATE 10 MG T: 10 | 90 days supply | Qty: 90 | Fill #1

## 2015-08-08 ENCOUNTER — Other Ambulatory Visit: Payer: Self-pay | Admitting: Physician Assistant

## 2015-08-09 NOTE — Telephone Encounter (Signed)
I'm okay with her having the medication despite having been some time since we have seen her. Her last renal panel was normal. Please advise she use this sparingly. Philis Fendt, MS, PA-C 6:44 PM, 08/09/2015

## 2015-08-10 MED FILL — IBUPROFEN 800 MG TABLET: 800 | 20 days supply | Qty: 60 | Fill #0

## 2015-08-16 ENCOUNTER — Encounter (HOSPITAL_COMMUNITY): Payer: Self-pay | Admitting: Emergency Medicine

## 2015-08-16 ENCOUNTER — Emergency Department (HOSPITAL_COMMUNITY)
Admission: EM | Admit: 2015-08-16 | Discharge: 2015-08-16 | Disposition: A | Discharge status: Home / Self Care | Payer: 59 | Attending: Emergency Medicine | Admitting: Emergency Medicine

## 2015-08-16 DIAGNOSIS — Z79899 Other long term (current) drug therapy: Secondary | ICD-10-CM | POA: Insufficient documentation

## 2015-08-16 DIAGNOSIS — Z87891 Personal history of nicotine dependence: Secondary | ICD-10-CM | POA: Diagnosis not present

## 2015-08-16 DIAGNOSIS — I10 Essential (primary) hypertension: Secondary | ICD-10-CM | POA: Insufficient documentation

## 2015-08-16 DIAGNOSIS — M62838 Other muscle spasm: Secondary | ICD-10-CM | POA: Insufficient documentation

## 2015-08-16 DIAGNOSIS — Z791 Long term (current) use of non-steroidal anti-inflammatories (NSAID): Secondary | ICD-10-CM | POA: Diagnosis not present

## 2015-08-16 DIAGNOSIS — M542 Cervicalgia: Secondary | ICD-10-CM | POA: Diagnosis present

## 2015-08-16 DIAGNOSIS — M25512 Pain in left shoulder: Secondary | ICD-10-CM | POA: Insufficient documentation

## 2015-08-16 MED ORDER — NAPROXEN 500 MG PO TABS: 500.0000 mg | ORAL_TABLET | Freq: Two times a day (BID) | ORAL | 0 refills | Status: DC

## 2015-08-16 MED ORDER — CYCLOBENZAPRINE HCL 10 MG PO TABS: 10.0000 mg | ORAL_TABLET | Freq: Two times a day (BID) | ORAL | 0 refills | Status: DC | PRN

## 2015-08-16 NOTE — ED Provider Notes (Signed)
[place] DEPT Provider Note   CSN: [id number] Arrival date & time: 08/16/15  1954     History   Chief Complaint Chief Complaint  Patient presents with  . Neck Pain    HPI Brianna Hawkins is a 55 y.o. female.  HPI  Brianna Hawkins is a 55 y.o. female with PMH significant for HTN, migraine who presents with 1.5 week history of constant, unchanging, worsening left shoulder pain that radiates to the left neck and left deltoid.  It does not extend past the elbow.  No numbness or weakness.  No swelling or color change.  No neck pain or headache.  No injury/trauma.  Does repetitive motions and lifting at work.  Has been taking 800 mg ibuprofen.  Past Medical History:  Diagnosis Date  . Allergy   . Arthritis   . Blood transfusion   . Blood transfusion without reported diagnosis   . GERD (gastroesophageal reflux disease)   . Headache(784.0)   . Heart murmur    very mild-echo 1/15-normal  . Hypertension   . Migraine   . Pain, joint, shoulder region, right   . Substance abuse   . Tuberculosis    7 YEARS AGO  . Urinary urgency   . Vertigo     Patient Active Problem List   Diagnosis Date Noted  . Right shoulder pain 03/03/2015  . Paresthesia 12/01/2014  . Chronic migraine without aura without status migrainosus, not intractable 11/11/2014  . Morbid obesity ([place]) 11/11/2014  . Chronic sinusitis 07/06/2013  . Palpitations 02/18/2012  . BMI 34.0-34.9,adult 08/02/2011  . Chest pain 04/21/2011  . Abnormal CXR 04/21/2011  . Dyspnea 04/21/2011  . Hypertension 02/08/2011  . Vertigo, benign positional 02/08/2011  . Insomnia 02/08/2011  . Headache(784.0) 02/07/2011    Past Surgical History:  Procedure Laterality Date  . BREAST LUMPECTOMY  2012   Underarm-rt-extra tissue  . COLONOSCOPY    . ETHMOIDECTOMY Left 07/10/2013   Procedure: ETHMOIDECTOMY LEFT,MAXILLARY OSTIAL ENLARGEMENT WITH REMOVAL OF DISEASE;  Surgeon: Rozetta Nunnery, MD;  Location: [place];  Service: ENT;  Laterality: Left;  . NOSE SURGERY    . TUBAL LIGATION    . TURBINATE REDUCTION Left 07/10/2013   Procedure: LEFT TURBINATE REDUCTION;  Surgeon: Rozetta Nunnery, MD;  Location: [place];  Service: ENT;  Laterality: Left;  . UPPER GI ENDOSCOPY      OB History    No data available       Home Medications    Prior to Admission medications   Medication Sig Start Date End Date Taking? Authorizing Provider  albuterol (PROVENTIL HFA;VENTOLIN HFA) 108 (90 BASE) MCG/ACT inhaler Inhale 2 puffs into the lungs every 4 (four) hours as needed for wheezing or shortness of breath (cough, shortness of breath or wheezing.). 02/26/14   Araceli Bouche, PA  amLODipine (NORVASC) 10 MG tablet Take 1 tablet (10 mg total) by mouth daily. 02/20/15   Wardell Honour, MD  amoxicillin-clavulanate (AUGMENTIN) 875-125 MG tablet Take 1 tablet by mouth 2 (two) times daily. 07/11/15   [name], MD  butalbital-acetaminophen-caffeine [facility]) [phone] MG tablet Take 1-2 tablets by mouth every 6 (six) hours as needed for headache. 07/11/15 07/10/16  [name], MD  cyclobenzaprine (FLEXERIL) 10 MG tablet Take 1 tablet (10 mg total) by mouth 2 (two) times daily as needed for muscle spasms. 08/16/15   Gloriann Loan, PA-C  esomeprazole (NEXIUM) 40 MG capsule Take 1 capsule (40 mg total) by  mouth daily. 02/20/15   Wardell Honour, MD  fluticasone (FLONASE) 50 MCG/ACT nasal spray Place 2 sprays into both nostrils daily. 04/05/15   [name], MD  hydrochlorothiazide (MICROZIDE) 12.5 MG capsule Take 1 capsule (12.5 mg total) by mouth daily. 02/20/15   Wardell Honour, MD  ibuprofen (ADVIL,MOTRIN) 800 MG tablet TAKE 1 TABLET BY MOUTH EVERY 8 HOURS AS NEEDED 08/09/15   Tereasa Coop, PA-C  Multiple Vitamins-Minerals (HAIR/SKIN/NAILS) TABS Take 1 tablet by mouth daily.    Historical Provider, MD  naproxen (NAPROSYN) 500 MG tablet Take 1 tablet (500 mg total) by mouth 2 (two) times  daily. 08/16/15   Gloriann Loan, PA-C  oxybutynin (DITROPAN XL) 5 MG 24 hr tablet Take 1-2 tablets at bedtime to decrease urinary frequency 07/11/15   [name], MD  rizatriptan (MAXALT-MLT) 10 MG disintegrating tablet Take 1 tablet (10 mg total) by mouth as needed. May repeat in 2 hours if needed 10/14/14   [name], MD  SUMAtriptan (IMITREX) 6 MG/0.5ML SOSY injection Inject 0.5 mLs (6 mg total) into the skin every 2 (two) hours as needed for migraine or headache (One injection at onset of headache/fim). F 03/03/15   [name], MD  traZODone (DESYREL) 50 MG tablet Take 0.5-1 tablets (25-50 mg total) by mouth at bedtime as needed for sleep. 07/11/15   [name], MD  zolpidem Lorrin Mais) 5 MG tablet Take one half to one pill at bedtime as needed for sleep 07/11/15   [name], MD    Family History Family History  Problem Relation Age of Onset  . Throat cancer Father     was a smoker  . Colon cancer Father   . Alcohol abuse Father   . Cancer Father 21    colon cancer  . Dementia Other   . HIV Sister   . Alcohol abuse Mother   . Diabetes Mother   . Diabetes Paternal Grandmother   . Diabetes Maternal Grandmother     Social History Social History  Substance Use Topics  . Smoking status: Former Smoker    Packs/day: 0.30    Years: 15.00    Types: Cigarettes    Quit date: 01/01/2001  . Smokeless tobacco: Never Used     Comment: "never inhaled"  . Alcohol use 0.6 oz/week    1 Glasses of wine per week     Comment: rare     Allergies   Banana; Watermelon [citrullus vulgaris]; and Tomato   Review of Systems Review of Systems All other systems negative unless otherwise stated in HPI   Physical Exam Updated Vital Signs BP 135/89 (BP Location: Right Arm)   Pulse 78   Temp 98.7 F (37.1 C) (Oral)   Resp 16   Ht 5\' 6"  (1.676 m)   Wt 103.9 kg   LMP 12/11/2013   SpO2 96%   BMI 36.96 kg/m   Physical Exam  Constitutional: She is oriented to person, place, and time. She  appears well-developed and well-nourished.  Non-toxic appearance. She does not have a sickly appearance. She does not appear ill.  HENT:  Head: Normocephalic and atraumatic.  Mouth/Throat: Oropharynx is clear and moist.  Eyes: Conjunctivae are normal. Pupils are equal, round, and reactive to light.  Neck: Normal range of motion. Neck supple.  No cervical midline tenderness.  Left trapezius with mild spasms and TTP.  FAROM of cervical spine.   Cardiovascular: Normal rate and regular rhythm.   Pulses:  Radial pulses are 2+ on the right side, and 2+ on the left side.  Pulmonary/Chest: Effort normal and breath sounds normal. No accessory muscle usage or stridor. No respiratory distress. She has no wheezes. She has no rhonchi. She has no rales.  Abdominal: Soft. Bowel sounds are normal. She exhibits no distension. There is no tenderness.  Musculoskeletal: Normal range of motion.  Left shoulder: no swelling, erythema, or ecchymosis.  FAROM.  Pain with internal rotation.  Positive empty can test.  Lymphadenopathy:    She has no cervical adenopathy.  Neurological: She is alert and oriented to person, place, and time.  Normal strength and sensation throughout BUE.  Skin: Skin is warm and dry.  Psychiatric: She has a normal mood and affect. Her behavior is normal.     ED Treatments / Results  Labs (all labs ordered are listed, but only abnormal results are displayed) Labs Reviewed - No data to display  EKG  EKG Interpretation None       Radiology No results found.  Procedures Procedures (including critical care time)  Medications Ordered in ED Medications - No data to display   Initial Impression / Assessment and Plan / ED Course  I have reviewed the triage vital signs and the nursing notes.  Pertinent labs & imaging results that were available during my care of the patient were reviewed by me and considered in my medical decision making (see chart for details).  Clinical  Course   No indication for imaging.  Exam c/w rotator cuff pathology or impingment.  Pt advised to follow up with orthopedics.  conservative therapy recommended and discussed. Patient will be discharged home & is agreeable with above plan. Returns precautions discussed. Pt appears safe for discharge.    Final Clinical Impressions(s) / ED Diagnoses   Final diagnoses:  Left shoulder pain  Muscle spasms of neck    New Prescriptions New Prescriptions   CYCLOBENZAPRINE (FLEXERIL) 10 MG TABLET    Take 1 tablet (10 mg total) by mouth 2 (two) times daily as needed for muscle spasms.   NAPROXEN (NAPROSYN) 500 MG TABLET    Take 1 tablet (500 mg total) by mouth 2 (two) times daily.     Gloriann Loan, PA-C 08/16/15 [id number]    Fatima Blank, MD 08/17/15 [phone]

## 2015-08-16 NOTE — ED Triage Notes (Signed)
Pt states for the past 2.5 weeks the left side of her neck has been hurting and the pain radiates down her left arm down to her elbow  Pt states she called her PCP and was given ibuprofen 800mg  that has not been helping  Pt denies injury

## 2015-09-06 DIAGNOSIS — M542 Cervicalgia: Secondary | ICD-10-CM | POA: Diagnosis not present

## 2015-09-06 DIAGNOSIS — M79622 Pain in left upper arm: Secondary | ICD-10-CM | POA: Diagnosis not present

## 2015-09-06 MED FILL — METHYLPREDNISOLONE 4 MG TAB: 4 | 6 days supply | Qty: 21 | Fill #0

## 2015-09-09 ENCOUNTER — Other Ambulatory Visit (HOSPITAL_COMMUNITY): Payer: Self-pay | Admitting: Orthopedic Surgery

## 2015-09-09 DIAGNOSIS — M542 Cervicalgia: Secondary | ICD-10-CM

## 2015-09-15 ENCOUNTER — Ambulatory Visit (HOSPITAL_COMMUNITY)
Admission: RE | Admit: 2015-09-15 | Discharge: 2015-09-15 | Disposition: A | Discharge status: Home / Self Care | Payer: 59 | Source: Ambulatory Visit | Attending: Orthopedic Surgery | Admitting: Orthopedic Surgery

## 2015-09-15 DIAGNOSIS — M4802 Spinal stenosis, cervical region: Secondary | ICD-10-CM | POA: Insufficient documentation

## 2015-09-15 DIAGNOSIS — M79602 Pain in left arm: Secondary | ICD-10-CM | POA: Insufficient documentation

## 2015-09-15 DIAGNOSIS — M47812 Spondylosis without myelopathy or radiculopathy, cervical region: Secondary | ICD-10-CM | POA: Diagnosis not present

## 2015-09-15 DIAGNOSIS — M542 Cervicalgia: Secondary | ICD-10-CM

## 2015-09-19 ENCOUNTER — Ambulatory Visit (HOSPITAL_COMMUNITY): Payer: 59

## 2015-09-23 DIAGNOSIS — M542 Cervicalgia: Secondary | ICD-10-CM | POA: Diagnosis not present

## 2015-09-23 DIAGNOSIS — M79622 Pain in left upper arm: Secondary | ICD-10-CM | POA: Diagnosis not present

## 2015-09-23 MED FILL — traMADol HCL 50 MG TABS: 50 | 20 days supply | Qty: 60 | Fill #0

## 2015-10-03 ENCOUNTER — Ambulatory Visit (INDEPENDENT_AMBULATORY_CARE_PROVIDER_SITE_OTHER): Payer: 59 | Admitting: Urgent Care

## 2015-10-03 ENCOUNTER — Ambulatory Visit (INDEPENDENT_AMBULATORY_CARE_PROVIDER_SITE_OTHER): Payer: 59 | Admitting: Physical Medicine and Rehabilitation

## 2015-10-03 VITALS — BP 126/76 | HR 69 | Temp 98.7°F | Resp 17 | Ht 66.5 in | Wt 231.0 lb

## 2015-10-03 DIAGNOSIS — N951 Menopausal and female climacteric states: Secondary | ICD-10-CM | POA: Diagnosis not present

## 2015-10-03 DIAGNOSIS — Z780 Asymptomatic menopausal state: Secondary | ICD-10-CM

## 2015-10-03 DIAGNOSIS — M5412 Radiculopathy, cervical region: Secondary | ICD-10-CM

## 2015-10-03 DIAGNOSIS — M4802 Spinal stenosis, cervical region: Secondary | ICD-10-CM

## 2015-10-03 MED ORDER — CONJ ESTROG-MEDROXYPROGEST ACE 0.3-1.5 MG PO TABS: 1.0000 | ORAL_TABLET | Freq: Every day | ORAL | 5 refills | Status: DC

## 2015-10-03 MED FILL — PREMPRO 0.3 MG-1.5 MG TAB: 0.3-1.5 | 28 days supply | Qty: 28 | Fill #0

## 2015-10-03 NOTE — Patient Instructions (Addendum)
Menopause Menopause is the normal time of life when menstrual periods stop completely. Menopause is complete when you have missed 12 consecutive menstrual periods. It usually occurs between the ages of 48 years and 55 years. Very rarely does a woman develop menopause before the age of 40 years. At menopause, your ovaries stop producing the female hormones estrogen and progesterone. This can cause undesirable symptoms and also affect your health. Sometimes the symptoms may occur 4-5 years before the menopause begins. There is no relationship between menopause and:  Oral contraceptives.  Number of children you had.  Race.  The age your menstrual periods started (menarche). Heavy smokers and very thin women may develop menopause earlier in life. CAUSES  The ovaries stop producing the female hormones estrogen and progesterone.  Other causes include:  Surgery to remove both ovaries.  The ovaries stop functioning for no known reason.  Tumors of the pituitary gland in the brain.  Medical disease that affects the ovaries and hormone production.  Radiation treatment to the abdomen or pelvis.  Chemotherapy that affects the ovaries. SYMPTOMS   Hot flashes.  Night sweats.  Decrease in sex drive.  Vaginal dryness and thinning of the vagina causing painful intercourse.  Dryness of the skin and developing wrinkles.  Headaches.  Tiredness.  Irritability.  Memory problems.  Weight gain.  Bladder infections.  Hair growth of the face and chest.  Infertility. More serious symptoms include:  Loss of bone (osteoporosis) causing breaks (fractures).  Depression.  Hardening and narrowing of the arteries (atherosclerosis) causing heart attacks and strokes. DIAGNOSIS   When the menstrual periods have stopped for 12 straight months.  Physical exam.  Hormone studies of the blood. TREATMENT  There are many treatment choices and nearly as many questions about them. The  decisions to treat or not to treat menopausal changes is an individual choice made with your health care provider. Your health care provider can discuss the treatments with you. Together, you can decide which treatment will work best for you. Your treatment choices may include:   Hormone therapy (estrogen and progesterone).  Non-hormonal medicines.  Treating the individual symptoms with medicine (for example antidepressants for depression).  Herbal medicines that may help specific symptoms.  Counseling by a psychiatrist or psychologist.  Group therapy.  Lifestyle changes including:  Eating healthy.  Regular exercise.  Limiting caffeine and alcohol.  Stress management and meditation.  No treatment. HOME CARE INSTRUCTIONS   Take the medicine your health care provider gives you as directed.  Get plenty of sleep and rest.  Exercise regularly.  Eat a diet that contains calcium (good for the bones) and soy products (acts like estrogen hormone).  Avoid alcoholic beverages.  Do not smoke.  If you have hot flashes, dress in layers.  Take supplements, calcium, and vitamin D to strengthen bones.  You can use over-the-counter lubricants or moisturizers for vaginal dryness.  Group therapy is sometimes very helpful.  Acupuncture may be helpful in some cases. SEEK MEDICAL CARE IF:   You are not sure you are in menopause.  You are having menopausal symptoms and need advice and treatment.  You are still having menstrual periods after age 55 years.  You have pain with intercourse.  Menopause is complete (no menstrual period for 12 months) and you develop vaginal bleeding.  You need a referral to a specialist (gynecologist, psychiatrist, or psychologist) for treatment. SEEK IMMEDIATE MEDICAL CARE IF:   You have severe depression.  You have excessive vaginal bleeding.    You fell and think you have a broken bone.  You have pain when you urinate.  You develop leg or  chest pain.  You have a fast pounding heart beat (palpitations).  You have severe headaches.  You develop vision problems.  You feel a lump in your breast.  You have abdominal pain or severe indigestion.   This information is not intended to replace advice given to you by your health care provider. Make sure you discuss any questions you have with your health care provider.   Document Released: 03/10/2003 Document Revised: 08/20/2012 Document Reviewed: 07/17/2012 Elsevier Interactive Patient Education 2016 [employer].     IF you received an x-ray today, you will receive an invoice from [facility]. Please contact [facility] at [phone] with questions or concerns regarding your invoice.   IF you received labwork today, you will receive an invoice from [employer]. Please contact [facility] at [phone] with questions or concerns regarding your invoice.   Our billing staff will not be able to assist you with questions regarding bills from these companies.  You will be contacted with the lab results as soon as they are available. The fastest way to get your results is to activate your My Chart account. Instructions are located on the last page of this paperwork. If you have not heard from [place] regarding the results in 2 weeks, please contact this office.

## 2015-10-03 NOTE — Progress Notes (Addendum)
Patient ID: Brianna Hawkins, female   DOB: 1960/07/06, 55 y.o.   MRN: [id number]   By signing my name below, I, Brianna Hawkins, attest that this documentation has been prepared under the direction and in the presence of Brianna Eagles, PA-C Electronically Signed: Ladene Artist, ED Scribe 10/03/2015 at 1:30 PM.  MRN: [id number] DOB: 1960-07-09  Subjective:   ERINNE GLADISH is a 55 y.o. female presenting for chief complaint of hot flashes. Pt is post-menopausal; her LNMP was 12/2013. She states that she was started on a pill and a patch to treat hot flashes which she stopped 1 year ago due to hair loss. Pt works at [employer] and has noticed increased mood swings and irritability while at work. She denies depression and vaginal bleeding. Pt does not currently have a GYN. She has not had a pap smear done this year but states that they have generally been normal. Pt is a nonsmoker.   [place] @CMEDP @ Also is allergic to banana; watermelon [citrullus vulgaris]; and tomato.  [place]  has a past medical history of Allergy; Arthritis; Blood transfusion; Blood transfusion without reported diagnosis; GERD (gastroesophageal reflux disease); Headache(784.0); Heart murmur; Hypertension; Migraine; Pain, joint, shoulder region, right; Substance abuse; Tuberculosis; Urinary urgency; and Vertigo. Also  has a past surgical history that includes Tubal ligation; Breast lumpectomy (2012); Colonoscopy; Upper gi endoscopy; Ethmoidectomy (Left, 07/10/2013); Turbinate reduction (Left, 07/10/2013); and Nose surgery.  Objective:   Vitals: BP 126/76 (BP Location: Right Arm, Patient Position: Sitting, Cuff Size: Large)    Pulse 69    Temp 98.7 F (37.1 C) (Oral)    Resp 17    Ht 5' 6.5" (1.689 m)    Wt 231 lb (104.8 kg)    LMP 12/11/2013    SpO2 97%    BMI 36.73 kg/m   Physical Exam  Constitutional: She is oriented to person, place, and time. She appears well-developed and well-nourished.  Cardiovascular: Normal rate, regular  rhythm and intact distal pulses.  Exam reveals no gallop and no friction rub.   No murmur heard. Pulmonary/Chest: No respiratory distress. She has no wheezes. She has no rales.  Abdominal: Soft. Bowel sounds are normal. She exhibits no distension and no mass. There is no tenderness. There is no guarding.  Neurological: She is alert and oriented to person, place, and time.   Assessment and Plan :   1. Menopause 2. Hot flashes due to menopause - Labs pending, referral to gynecology pending. Patient agreed to start Prempro. Discussed potential for adverse effects including risks of taking HRT, Patient verbalized understanding. RTC in 4 weeks if no improvement.  Brianna Eagles, PA-C [facility] [phone] 10/03/2015 1:29 PM

## 2015-10-04 LAB — FOLLICLE STIMULATING HORMONE: FSH: 96.7 m[IU]/mL

## 2015-10-06 ENCOUNTER — Ambulatory Visit (INDEPENDENT_AMBULATORY_CARE_PROVIDER_SITE_OTHER): Payer: 59 | Admitting: Orthopedic Surgery

## 2015-10-08 LAB — ESTRONE: Estrone: 32 pg/mL

## 2015-10-10 ENCOUNTER — Encounter: Payer: Self-pay | Admitting: Urgent Care

## 2015-10-13 ENCOUNTER — Encounter (INDEPENDENT_AMBULATORY_CARE_PROVIDER_SITE_OTHER): Payer: 59 | Admitting: Physical Medicine and Rehabilitation

## 2015-10-13 DIAGNOSIS — M5412 Radiculopathy, cervical region: Secondary | ICD-10-CM

## 2015-10-13 MED FILL — diazePAM 5 MG TABS: 5 | 2 days supply | Qty: 2 | Fill #0

## 2015-10-24 MED FILL — ESOMEPRAZOLE MAG DR 40 MG C: 40 | 90 days supply | Qty: 90 | Fill #2

## 2015-12-21 ENCOUNTER — Encounter (HOSPITAL_COMMUNITY): Payer: Self-pay | Admitting: Neurology

## 2015-12-21 ENCOUNTER — Emergency Department (HOSPITAL_COMMUNITY): Payer: Self-pay

## 2015-12-21 ENCOUNTER — Emergency Department (HOSPITAL_COMMUNITY)
Admission: EM | Admit: 2015-12-21 | Discharge: 2015-12-21 | Disposition: A | Discharge status: Home / Self Care | Payer: Self-pay | Attending: Emergency Medicine | Admitting: Emergency Medicine

## 2015-12-21 DIAGNOSIS — Z87891 Personal history of nicotine dependence: Secondary | ICD-10-CM | POA: Insufficient documentation

## 2015-12-21 DIAGNOSIS — R0789 Other chest pain: Secondary | ICD-10-CM

## 2015-12-21 DIAGNOSIS — K219 Gastro-esophageal reflux disease without esophagitis: Secondary | ICD-10-CM | POA: Diagnosis not present

## 2015-12-21 DIAGNOSIS — R0602 Shortness of breath: Secondary | ICD-10-CM

## 2015-12-21 DIAGNOSIS — Z79899 Other long term (current) drug therapy: Secondary | ICD-10-CM | POA: Insufficient documentation

## 2015-12-21 DIAGNOSIS — I10 Essential (primary) hypertension: Secondary | ICD-10-CM | POA: Insufficient documentation

## 2015-12-21 LAB — I-STAT TROPONIN, ED
TROPONIN I, POC: 0 ng/mL (ref 0.00–0.08)
Troponin i, poc: 0 ng/mL (ref 0.00–0.08)

## 2015-12-21 LAB — BASIC METABOLIC PANEL
ANION GAP: 7 (ref 5–15)
BUN: 13 mg/dL (ref 6–20)
CALCIUM: 9.6 mg/dL (ref 8.9–10.3)
CO2: 26 mmol/L (ref 22–32)
Chloride: 107 mmol/L (ref 101–111)
Creatinine, Ser: 0.78 mg/dL (ref 0.44–1.00)
GLUCOSE: 125 mg/dL — AB (ref 65–99)
POTASSIUM: 3.8 mmol/L (ref 3.5–5.1)
SODIUM: 140 mmol/L (ref 135–145)

## 2015-12-21 LAB — CBC
HEMATOCRIT: 37 % (ref 36.0–46.0)
HEMOGLOBIN: 12.2 g/dL (ref 12.0–15.0)
MCH: 29.1 pg (ref 26.0–34.0)
MCHC: 33 g/dL (ref 30.0–36.0)
MCV: 88.3 fL (ref 78.0–100.0)
Platelets: 272 10*3/uL (ref 150–400)
RBC: 4.19 MIL/uL (ref 3.87–5.11)
RDW: 13.7 % (ref 11.5–15.5)
WBC: 5.4 10*3/uL (ref 4.0–10.5)

## 2015-12-21 LAB — D-DIMER, QUANTITATIVE (NOT AT ARMC)

## 2015-12-21 MED ORDER — ASPIRIN 81 MG PO CHEW
324.0000 mg | CHEWABLE_TABLET | Freq: Once | ORAL | Status: AC
  Administered 2015-12-21: 324 mg via ORAL
  Filled 2015-12-21: qty 4

## 2015-12-21 MED ORDER — GI COCKTAIL ~~LOC~~
30.0000 mL | Freq: Once | ORAL | Status: AC
  Administered 2015-12-21: 30 mL via ORAL
  Filled 2015-12-21: qty 30

## 2015-12-21 NOTE — Discharge Instructions (Signed)
Your labs, xray, and EKG are all reassuring, your chest pain could be a variety of things including gas pain, indigestion, muscle pain, and other nonemergent issues. Alternate between tylenol and motrin as directed as needed for pain. May consider using tums/maalox or over the counter zantac as needed to help with symptoms. Avoid spicy and fried foods, acidic foods, and don't lay flat within 30 minutes of eating. Stay well hydrated. You may consider using heat to the areas of pain, no more than 20 minutes every hour. Follow up with your regular doctor in 1 week for recheck of symptoms. Return to the ER for changes or worsening symptoms.  SEEK IMMEDIATE MEDICAL ATTENTION IF: You develop a fever.  Your chest pains become severe or intolerable.  You develop new, unexplained symptoms (problems).  You develop shortness of breath, nausea, vomiting, sweating or feel light headed.  You develop a new cough or you cough up blood. You develop new leg swelling

## 2015-12-21 NOTE — ED Triage Notes (Signed)
Pt reports left sided CP x 1 month that is worse at night and was worse last night and couldn't hardly sleep. Denies cardiac hx. Denies n/v, but reports SOB.

## 2015-12-21 NOTE — ED Provider Notes (Signed)
[facility] DEPT Provider Note   CSN: [id number] Arrival date & time: 12/21/15  [id number]     History   Chief Complaint Chief Complaint  Patient presents with  . Chest Pain    HPI Brianna Hawkins is a 55 y.o. female with a PMHx of GERD, heart murmur, HTN, migraines, substance abuse, vertigo, and other medical conditions listed below, who presents to the ED with complaints of constant chest pain 1 month. She describes the pain is 8/10 constant heaviness in the left side of her chest, nonradiating, worse with movement, and unrelieved with ibuprofen. Reports it seems to be worse at night. She also reports intermittent shortness of breath. Additionally she states that she has had some right lower extremity swelling for the last 3-4 months that seems to be intermittent mostly when she is on her feet that improves with elevation. Positive family history of DVT in her grandmother. Patient is a nonsmoker with no family history of cardiac disease.  She denies lightheadedness, diaphoresis, claudication, orthopnea, fevers, chills, coughing, wheezing, recent travel/surg/immobilization, estrogen use, personal hx of DVT/PE, abd pain, N/V/D/C, hematuria, dysuria, myalgias, arthralgias, numbness, tingling, weakness, or any other complaints at this time.    The history is provided by the patient and medical records. No language interpreter was used.  Chest Pain   This is a new problem. The current episode started more than 1 week ago. The problem occurs constantly. The problem has not changed since onset.The pain is associated with movement. The pain is present in the lateral region. The pain is at a severity of 8/10. The pain is moderate. The quality of the pain is described as heavy. The pain does not radiate. Duration of episode(s) is 1 month. Exacerbated by: movement. Associated symptoms include lower extremity edema (RLE, intermittent, dependent) and shortness of breath. Pertinent negatives include no  abdominal pain, no claudication, no cough, no diaphoresis, no fever, no nausea, no numbness, no orthopnea, no vomiting and no weakness. Treatments tried: ibuprofen. The treatment provided no relief.  Her past medical history is significant for hypertension.  Pertinent negatives for past medical history include no DVT and no PE.  Pertinent negatives for family medical history include: no CAD. Family history comments: +DVT in grandmother    Past Medical History:  Diagnosis Date  . Allergy   . Arthritis   . Blood transfusion   . Blood transfusion without reported diagnosis   . GERD (gastroesophageal reflux disease)   . Headache(784.0)   . Heart murmur    very mild-echo 1/15-normal  . Hypertension   . Migraine   . Pain, joint, shoulder region, right   . Substance abuse   . Tuberculosis    7 YEARS AGO  . Urinary urgency   . Vertigo     Patient Active Problem List   Diagnosis Date Noted  . Right shoulder pain 03/03/2015  . Paresthesia 12/01/2014  . Chronic migraine without aura without status migrainosus, not intractable 11/11/2014  . Morbid obesity ([place]) 11/11/2014  . Chronic sinusitis 07/06/2013  . Palpitations 02/18/2012  . BMI 34.0-34.9,adult 08/02/2011  . Chest pain 04/21/2011  . Abnormal CXR 04/21/2011  . Dyspnea 04/21/2011  . Hypertension 02/08/2011  . Vertigo, benign positional 02/08/2011  . Insomnia 02/08/2011  . Headache(784.0) 02/07/2011    Past Surgical History:  Procedure Laterality Date  . BREAST LUMPECTOMY  2012   Underarm-rt-extra tissue  . COLONOSCOPY    . ETHMOIDECTOMY Left 07/10/2013   Procedure: ETHMOIDECTOMY LEFT,MAXILLARY OSTIAL ENLARGEMENT WITH REMOVAL OF  DISEASE;  Surgeon: Rozetta Nunnery, MD;  Location: [facility];  Service: ENT;  Laterality: Left;  . NOSE SURGERY    . TUBAL LIGATION    . TURBINATE REDUCTION Left 07/10/2013   Procedure: LEFT TURBINATE REDUCTION;  Surgeon: Rozetta Nunnery, MD;  Location: [facility];  Service: ENT;  Laterality: Left;  . UPPER GI ENDOSCOPY      OB History    No data available       Home Medications    Prior to Admission medications   Medication Sig Start Date End Date Taking? Authorizing Provider  albuterol (PROVENTIL HFA;VENTOLIN HFA) 108 (90 BASE) MCG/ACT inhaler Inhale 2 puffs into the lungs every 4 (four) hours as needed for wheezing or shortness of breath (cough, shortness of breath or wheezing.). 02/26/14   Araceli Bouche, PA  amLODipine (NORVASC) 10 MG tablet Take 1 tablet (10 mg total) by mouth daily. 02/20/15   Wardell Honour, MD  amoxicillin-clavulanate (AUGMENTIN) 875-125 MG tablet Take 1 tablet by mouth 2 (two) times daily. 07/11/15   [name], MD  butalbital-acetaminophen-caffeine [facility]) [phone] MG tablet Take 1-2 tablets by mouth every 6 (six) hours as needed for headache. 07/11/15 07/10/16  [name], MD  cyclobenzaprine (FLEXERIL) 10 MG tablet Take 1 tablet (10 mg total) by mouth 2 (two) times daily as needed for muscle spasms. 08/16/15   Gloriann Loan, PA-C  esomeprazole (NEXIUM) 40 MG capsule Take 1 capsule (40 mg total) by mouth daily. 02/20/15   Wardell Honour, MD  estrogen, conjugated,-medroxyprogesterone (PREMPRO) 0.3-1.5 MG tablet Take 1 tablet by mouth daily. 10/03/15   [name], PA-C  fluticasone (FLONASE) 50 MCG/ACT nasal spray Place 2 sprays into both nostrils daily. 04/05/15   [name], MD  hydrochlorothiazide (MICROZIDE) 12.5 MG capsule Take 1 capsule (12.5 mg total) by mouth daily. 02/20/15   Wardell Honour, MD  ibuprofen (ADVIL,MOTRIN) 800 MG tablet TAKE 1 TABLET BY MOUTH EVERY 8 HOURS AS NEEDED 08/09/15   Tereasa Coop, PA-C  Multiple Vitamins-Minerals (HAIR/SKIN/NAILS) TABS Take 1 tablet by mouth daily.    Historical Provider, MD  naproxen (NAPROSYN) 500 MG tablet Take 1 tablet (500 mg total) by mouth 2 (two) times daily. 08/16/15   Gloriann Loan, PA-C  oxybutynin (DITROPAN XL) 5 MG 24 hr tablet Take 1-2 tablets at bedtime to  decrease urinary frequency 07/11/15   [name], MD  rizatriptan (MAXALT-MLT) 10 MG disintegrating tablet Take 1 tablet (10 mg total) by mouth as needed. May repeat in 2 hours if needed 10/14/14   [name], MD  SUMAtriptan (IMITREX) 6 MG/0.5ML SOSY injection Inject 0.5 mLs (6 mg total) into the skin every 2 (two) hours as needed for migraine or headache (One injection at onset of headache/fim). F 03/03/15   [name], MD  traZODone (DESYREL) 50 MG tablet Take 0.5-1 tablets (25-50 mg total) by mouth at bedtime as needed for sleep. 07/11/15   [name], MD  zolpidem Lorrin Mais) 5 MG tablet Take one half to one pill at bedtime as needed for sleep 07/11/15   [name], MD    Family History Family History  Problem Relation Age of Onset  . Throat cancer Father     was a smoker  . Colon cancer Father   . Alcohol abuse Father   . Cancer Father 80    colon cancer  . Dementia Other   . HIV Sister   . Alcohol  abuse Mother   . Diabetes Mother   . Diabetes Paternal Grandmother   . Diabetes Maternal Grandmother     Social History Social History  Substance Use Topics  . Smoking status: Former Smoker    Packs/day: 0.30    Years: 15.00    Types: Cigarettes    Quit date: 01/01/2001  . Smokeless tobacco: Never Used     Comment: "never inhaled"  . Alcohol use 0.6 oz/week    1 Glasses of wine per week     Comment: rare     Allergies   Banana; Watermelon [citrullus vulgaris]; and Tomato   Review of Systems Review of Systems  Constitutional: Negative for chills, diaphoresis and fever.  Respiratory: Positive for shortness of breath. Negative for cough and wheezing.   Cardiovascular: Positive for chest pain and leg swelling (RLE intermittently, dependent). Negative for orthopnea and claudication.  Gastrointestinal: Negative for abdominal pain, constipation, diarrhea, nausea and vomiting.  Genitourinary: Negative for dysuria and hematuria.  Musculoskeletal: Negative for arthralgias and  myalgias.  Skin: Negative for color change.  Allergic/Immunologic: Negative for immunocompromised state.  Neurological: Negative for weakness, light-headedness and numbness.  Psychiatric/Behavioral: Negative for confusion.   10 Systems reviewed and are negative for acute change except as noted in the HPI.   Physical Exam Updated Vital Signs BP 130/81 (BP Location: Right Arm)   Pulse 66   Temp 98.5 F (36.9 C) (Oral)   Resp 22   Ht 5\' 5"  (1.651 m)   Wt 109.3 kg   LMP 12/11/2013   SpO2 100%   BMI 40.10 kg/m   Physical Exam  Constitutional: She is oriented to person, place, and time. Vital signs are normal. She appears well-developed and well-nourished.  Non-toxic appearance. No distress.  Afebrile, nontoxic, NAD  HENT:  Head: Normocephalic and atraumatic.  Mouth/Throat: Oropharynx is clear and moist and mucous membranes are normal.  Eyes: Conjunctivae and EOM are normal. Right eye exhibits no discharge. Left eye exhibits no discharge.  Neck: Normal range of motion. Neck supple.  Cardiovascular: Normal rate, regular rhythm, normal heart sounds and intact distal pulses.  Exam reveals no gallop and no friction rub.   No murmur heard. RRR, nl s1/s2, no m/r/g, distal pulses intact, no pedal edema   Pulmonary/Chest: Effort normal and breath sounds normal. No respiratory distress. She has no decreased breath sounds. She has no wheezes. She has no rhonchi. She has no rales. She exhibits tenderness. She exhibits no crepitus, no deformity and no retraction.    CTAB in all lung fields, no w/r/r, no hypoxia or increased WOB, speaking in full sentences, SpO2 100% on RA Chest wall with mild TTP over L breast area, without crepitus, deformities, or retractions   Abdominal: Soft. Normal appearance and bowel sounds are normal. She exhibits no distension. There is no tenderness. There is no rigidity, no rebound, no guarding, no CVA tenderness, no tenderness at McBurney's point and negative Murphy's  sign.  Musculoskeletal: Normal range of motion.  MAE x4 Strength and sensation grossly intact in all extremities Distal pulses intact Gait steady No pedal edema, neg homan's bilaterally   Neurological: She is alert and oriented to person, place, and time. She has normal strength. No sensory deficit.  Skin: Skin is warm, dry and intact. No rash noted.  Psychiatric: She has a normal mood and affect.  Nursing note and vitals reviewed.    ED Treatments / Results  Labs (all labs ordered are listed, but only abnormal results are displayed)  Labs Reviewed  BASIC METABOLIC PANEL - Abnormal; Notable for the following:       Result Value   Glucose, Bld 125 (*)    All other components within normal limits  CBC  D-DIMER, QUANTITATIVE (NOT AT [facility])  I-STAT TROPOININ, ED  I-STAT TROPOININ, ED    EKG  EKG Interpretation  Date/Time:  Wednesday December 21 2015 13:45:13 EST Ventricular Rate:  61 PR Interval:    QRS Duration: 76 QT Interval:  415 QTC Calculation: 418 R Axis:   66 Text Interpretation:  Sinus rhythm Borderline prolonged PR interval Consider left atrial enlargement No significant change since last tracing Confirmed by [facility] MD, PEDRO ([id number]) on 12/21/2015 1:47:36 PM       Radiology Dg Chest 2 View  Result Date: 12/21/2015 CLINICAL DATA:  Dull chest pain for 1 month, initial encounter EXAM: CHEST  2 VIEW COMPARISON:  11/04/2014 FINDINGS: Cardiac shadow is stable. Stable linear scarring in the right upper lobe is noted. Some nodularity is noted laterally in the upper lobe similar to that seen on prior exams and stable. No new focal infiltrate or sizable effusion is seen. No acute bony abnormality is noted. IMPRESSION: Stable changes in the right upper lobe when compared with the prior exam. No new focal abnormality is seen. Electronically Signed   By: Inez Catalina M.D.   On: 12/21/2015 10:30   Echo [id number]: Normal systolic function. EF 63%, mildly dilated left  atrial cavity, no significant valvular abnormality   Procedures Procedures (including critical care time)  Medications Ordered in ED Medications  gi cocktail (Maalox,Lidocaine,Donnatal) (30 mLs Oral Given 12/21/15 1253)  aspirin chewable tablet 324 mg (324 mg Oral Given 12/21/15 1253)     Initial Impression / Assessment and Plan / ED Course  I have reviewed the triage vital signs and the nursing notes.  Pertinent labs & imaging results that were available during my care of the patient were reviewed by me and considered in my medical decision making (see chart for details).  Clinical Course     55 y.o. female here with chest pain and SOB 1 month. Has also noticed RLE swelling intermittently x1 month which is dependent, and improves with elevation. +FHx of DVT in her grandmother. On exam, no hypoxia or tachycardia, clear lung exam, chest pain reproducible over L breast, no LE swelling. EKG with some T wave inversions laterally but poor quality EKG overall-- will repeat. Labs otherwise unremarkable, and CXR neg for acute findings. Given PERC+ due to age, and FHx of DVT, and reports of intermittent LE swelling, will get D-dimer. Will repeat trop at 3hr mark. Will give GI cocktail and ASA, and reassess shortly  4:16 PM D-dimer neg. Second trop neg. Repeat EKG unremarkable and with no TWI, similar to prior EKGs. Pt feeling better after GI cocktail, some component of GERD/indigestion could be playing a part. discussed tums/maalox for symptoms, OTC zantac, tylenol/motrin, heat use, and f/up with PCP in 1wk for recheck and ongoing management. Doubt need for further emergent work up now. Diet/lifestyle modifications discussed for GERD. I explained the diagnosis and have given explicit precautions to return to the ER including for any other new or worsening symptoms. The patient understands and accepts the medical plan as it's been dictated and I have answered their questions. Discharge instructions  concerning home care and prescriptions have been given. The patient is STABLE and is discharged to home in good condition.    Final Clinical Impressions(s) / ED Diagnoses  Final diagnoses:  Atypical chest pain  SOB (shortness of breath)  Gastroesophageal reflux disease, esophagitis presence not specified    New Prescriptions New Prescriptions   No medications on file     Zacarias Pontes, PA-C 12/21/15 [place], MD 12/21/15 [phone]

## 2016-02-24 ENCOUNTER — Encounter: Payer: Self-pay | Admitting: Internal Medicine

## 2016-02-24 ENCOUNTER — Ambulatory Visit (INDEPENDENT_AMBULATORY_CARE_PROVIDER_SITE_OTHER): Payer: Self-pay | Admitting: Internal Medicine

## 2016-02-24 VITALS — BP 128/84 | HR 74 | Resp 12 | Ht 65.0 in | Wt 244.0 lb

## 2016-02-24 DIAGNOSIS — K029 Dental caries, unspecified: Secondary | ICD-10-CM

## 2016-02-24 DIAGNOSIS — M722 Plantar fascial fibromatosis: Secondary | ICD-10-CM

## 2016-02-24 DIAGNOSIS — J329 Chronic sinusitis, unspecified: Secondary | ICD-10-CM

## 2016-02-24 DIAGNOSIS — M542 Cervicalgia: Secondary | ICD-10-CM

## 2016-02-24 DIAGNOSIS — M7050 Other bursitis of knee, unspecified knee: Secondary | ICD-10-CM

## 2016-02-24 DIAGNOSIS — Z9109 Other allergy status, other than to drugs and biological substances: Secondary | ICD-10-CM

## 2016-02-24 DIAGNOSIS — I10 Essential (primary) hypertension: Secondary | ICD-10-CM

## 2016-02-24 MED ORDER — HYDROCHLOROTHIAZIDE 12.5 MG PO CAPS: 12.5000 mg | ORAL_CAPSULE | Freq: Every day | ORAL | 3 refills | Status: DC

## 2016-02-24 MED ORDER — FLUTICASONE PROPIONATE 50 MCG/ACT NA SUSP: 2.0000 | Freq: Every day | NASAL | 11 refills | Status: DC

## 2016-02-24 MED ORDER — AMLODIPINE BESYLATE 10 MG PO TABS: 10.0000 mg | ORAL_TABLET | Freq: Every day | ORAL | 11 refills | Status: DC

## 2016-02-24 MED ORDER — RIZATRIPTAN BENZOATE 10 MG PO TBDP: 10.0000 mg | ORAL_TABLET | ORAL | 6 refills | Status: DC | PRN

## 2016-02-24 MED ORDER — ESOMEPRAZOLE MAGNESIUM 40 MG PO CPDR: 40.0000 mg | DELAYED_RELEASE_CAPSULE | Freq: Every day | ORAL | 11 refills | Status: DC

## 2016-02-24 NOTE — Patient Instructions (Addendum)
Can google "advance directives, [place]"  And bring up form from Secretary of State. Print and fill out Or can go to "5 wishes"  Which is also in Spanish and fill out--this costs $5--perhaps easier to use. Designate a Medical Power of Attorney to speak for you if you are unable to speak for yourself when ill or injured  Drink a glass of water before every meal Drink 6-8 glasses of water daily Eat three meals daily Eat a protein and healthy fat with every meal (eggs,fish, chicken, turkey and limit red meats) Eat 5 servings of vegetables daily, mix the colors Eat 2 servings of fruit daily with skin, if skin is edible Use smaller plates Put food/utensils down as you chew and swallow each bite Eat at a table with friends/family at least once daily, no TV Do not eat in front of the TV 

## 2016-02-24 NOTE — Progress Notes (Signed)
Subjective:    Patient ID: Brianna Hawkins, female    DOB: [birth year], 56 y.o.   MRN: [id number]  HPI   Here to establish.  1.  Pain in arms and legs for 6 months.  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  Slinging heavy bags of linens.  Feels this is the cause.   Went to Dr. Marlou Sa?  Ortho in September of 2017.  Underwent MRI of cspine and found to have spondylosis at all levels with most significant findings at C5-C6 level with moderate bilateral foraminal stenosis.   Describes ?corticosteroid epidural injection that did not really seem to help, in fact, feels she is worse.   Has pain in both shoulders radiating down to elbows--"feels like hanging on by bone"  Throbbing, mainly in the shoulder.  Does get numbness and tingling in her hands at night--wakes her from sleep. Has never utilized splints. Only right leg bothers her.  Points to pes anserine bursa area--bothers her the most with driving or being on feet for a long period of time.  Throbbing pain and leg swells when hurting.    2.  Left foot:  Feels like walking on nails or walking on the bone.  Has gained about 20 lbs since stopped working.   No walking on hard surfaces or going barefoot.   3.  Depressed from being out of work and sits at home eating junk food.  Is looking for work.  4.  Essential Hypertension:  Diagnosed in 2015.  Controlled with amlodipine 10 mg and HCTZ 25 mg daily.  Current Meds  Medication Sig  . amLODipine (NORVASC) 10 MG tablet Take 1 tablet (10 mg total) by mouth daily.  Marland Kitchen esomeprazole (NEXIUM) 40 MG capsule Take 1 capsule (40 mg total) by mouth daily.  . fluticasone (FLONASE) 50 MCG/ACT nasal spray Place 2 sprays into both nostrils daily.  . hydrochlorothiazide (MICROZIDE) 12.5 MG capsule Take 1 capsule (12.5 mg total) by mouth daily.  Marland Kitchen ibuprofen (ADVIL,MOTRIN) 800 MG tablet TAKE 1 TABLET BY MOUTH EVERY 8 HOURS AS NEEDED  . rizatriptan (MAXALT-MLT) 10 MG disintegrating  tablet Take 1 tablet (10 mg total) by mouth as needed. May repeat in 2 hours if needed  . zolpidem (AMBIEN) 5 MG tablet Take one half to one pill at bedtime as needed for sleep    Allergies  Allergen Reactions  . Banana Itching    Of throat  . Watermelon [Citrullus Vulgaris]   . Tomato Rash       Review of Systems     Objective:   Physical Exam  NAD, obese HEENT: PERRL, EOMI, throat without injection.  Many missing teeth with dental decay, Neck:  Supple, No adenopathy, no thyromegaly Chest:  CTA CV:  RRR with normal S1 and S2, No S3, S4 or murmur, No carotid bruits, carotid, radial pulses normal and equal LE:  Tender over left pes anserine bursa and right plantar heel. Back/neck:  Tender over left trap Neuro:  A & O x 3, CN II-XII grossly intact Motor, 5/5 throughout, negative tinels over both wrists/volar median nerve        Assessment & Plan:  1.  C spine DDD/DJD with radiculopathy/Left plantar fasciitis, pes anserine bursitis:  Referral to [place] PT clinic.  2.  Dental Decay:  Dental referral.  3.  Essential Hypertension:  Controlled.  Amlodipine and HCTZ refilled.  4.  Migraine Headaches:  Needed Imitrex refilled.  Wrote  for Maxalt 10 mg, may repeat in 2 hours if migraine not resolved.  To obtain at [place].    5.  Chronic Allergies/Sinusitis :  Brings this up at last minute:  Chronic nasal drainage and sinus pressure:  Flonase and Neti pot.

## 2016-02-27 ENCOUNTER — Telehealth: Payer: Self-pay | Admitting: Internal Medicine

## 2016-02-27 NOTE — Telephone Encounter (Signed)
Patient needs refill for esomeprazole(NEXIUM) 40 mg capsule.  Wants to know if doctor could give patient a written Rx so that she can pick up the medicine from [facility].

## 2016-02-29 ENCOUNTER — Other Ambulatory Visit: Payer: Self-pay | Admitting: Licensed Clinical Social Worker

## 2016-03-06 NOTE — Telephone Encounter (Signed)
Left message for patient. All Rx's were sent to [facility] on the day of her appointment. Cost will be cheaper going throught the health department.

## 2016-03-14 ENCOUNTER — Other Ambulatory Visit: Payer: Self-pay

## 2016-03-14 ENCOUNTER — Encounter: Payer: Self-pay | Admitting: Internal Medicine

## 2016-03-14 ENCOUNTER — Ambulatory Visit (INDEPENDENT_AMBULATORY_CARE_PROVIDER_SITE_OTHER): Payer: Self-pay | Admitting: Internal Medicine

## 2016-03-14 VITALS — BP 122/88 | HR 80 | Temp 98.2°F | Resp 12 | Ht 66.0 in | Wt 238.0 lb

## 2016-03-14 DIAGNOSIS — J0100 Acute maxillary sinusitis, unspecified: Secondary | ICD-10-CM

## 2016-03-14 DIAGNOSIS — H1032 Unspecified acute conjunctivitis, left eye: Secondary | ICD-10-CM

## 2016-03-14 DIAGNOSIS — I10 Essential (primary) hypertension: Secondary | ICD-10-CM

## 2016-03-14 MED ORDER — ESOMEPRAZOLE MAGNESIUM 40 MG PO CPDR
40.0000 mg | DELAYED_RELEASE_CAPSULE | Freq: Every day | ORAL | 11 refills | Status: DC
Start: 2016-03-14 — End: 2016-05-15

## 2016-03-14 MED ORDER — AZITHROMYCIN 250 MG PO TABS: ORAL_TABLET | ORAL | 0 refills | Status: DC

## 2016-03-14 NOTE — Patient Instructions (Signed)
Continue saline nasal wash/spray and Flonase.  Call if no improvement with eye in 48 hours or no improvement with sinuses beginning of next week.

## 2016-03-14 NOTE — Progress Notes (Signed)
   Subjective:    Patient ID: Brianna Hawkins, female    DOB: 1960-09-15, 56 y.o.   MRN: [id number]  HPI   Woke up this morning at 4 a.m. With watery left eye.  Rubbed the eye, though denies itching.  When she woke up later this morning, had thick green discharge in her eye matting the eyelashes and eye hurt.  Looking in mirror, noted redness of the eye.   Having sinus congestion for weeks with green nasal discharge.  Has been using Neti pot and Flonase 2 sprays each nostril once daily since last seen end of February without improvement.   Also, with an itchy, sore throat starting this morning. No fever.    2.  Obesity:  Has made changes with lifestyle already and has lost 6 lbs since end of February.  Current Meds  Medication Sig  . amLODipine (NORVASC) 10 MG tablet Take 1 tablet (10 mg total) by mouth daily.  . butalbital-acetaminophen-caffeine (FIORICET) 50-325-40 MG tablet Take 1-2 tablets by mouth every 6 (six) hours as needed for headache.  . cyclobenzaprine (FLEXERIL) 10 MG tablet Take 1 tablet (10 mg total) by mouth 2 (two) times daily as needed for muscle spasms.  Marland Kitchen esomeprazole (NEXIUM) 40 MG capsule Take 1 capsule (40 mg total) by mouth daily.  . fluticasone (FLONASE) 50 MCG/ACT nasal spray Place 2 sprays into both nostrils daily.  . hydrochlorothiazide (MICROZIDE) 12.5 MG capsule Take 1 capsule (12.5 mg total) by mouth daily.  Marland Kitchen ibuprofen (ADVIL,MOTRIN) 800 MG tablet TAKE 1 TABLET BY MOUTH EVERY 8 HOURS AS NEEDED  . Multiple Vitamins-Minerals (HAIR/SKIN/NAILS) TABS Take 1 tablet by mouth daily.  . rizatriptan (MAXALT-MLT) 10 MG disintegrating tablet Take 1 tablet (10 mg total) by mouth as needed. May repeat in 2 hours if needed  . zolpidem (AMBIEN) 5 MG tablet Take one half to one pill at bedtime as needed for sleep   Allergies  Allergen Reactions  . Banana Itching    Of throat  . Watermelon [Citrullus Vulgaris]   . Tomato Rash      Review of Systems     Objective:    Physical Exam  NAD HEENT:  PERRL EOMI, left conjunctiva injected with watering.  TMs pearly gray, mild posterior pharyngeal injection without exudate,  NT over sinuses both frontal and maxillary, left nostril with redness and green discharge/crusting Neck:  Supple, no adenopathy Chest:  CTA CV:  RRR without murmur or rub, radial pulses normal and equal         Assessment & Plan:  Left maxillary sinusitis likely with left conjunctivitis:  Azithromycin 500 mg daily for 7 days.   Continue nasal/sinus saline rinses and Flonase. Call if no improvement or worsen.

## 2016-03-20 ENCOUNTER — Telehealth: Payer: Self-pay | Admitting: Internal Medicine

## 2016-03-20 MED ORDER — AMOXICILLIN-POT CLAVULANATE 875-125 MG PO TABS: 1.0000 | ORAL_TABLET | Freq: Two times a day (BID) | ORAL | 0 refills | Status: DC

## 2016-03-20 NOTE — Telephone Encounter (Signed)
Spoke with patient. States she was given Zithromax taking 2 for 7 days and she just finished her last dose today. States every morning she is still waking up with green discharge coming from her eye. Patient states she did not get any eye drops. Patient would like a Rx for eye drops if possible. To Dr. Amil Amen for further direction.

## 2016-03-20 NOTE — Telephone Encounter (Signed)
Patient was in office last for pink eye?Marland Kitchen  Patient states that eye is better, but blurry and stilll feels scratchy.  During the day is ok, but at night mucus runs in eye while sleep.  Wake up in the morning eye filled with green mucus.  Patient wants to know if doctor can call in another Rx for her.  She does not have money to come into office for visit.

## 2016-03-20 NOTE — Telephone Encounter (Signed)
Left message for patient to call back with more information.

## 2016-03-21 NOTE — Telephone Encounter (Signed)
Spoke with patient.

## 2016-05-15 ENCOUNTER — Encounter: Payer: Self-pay | Admitting: Internal Medicine

## 2016-05-15 ENCOUNTER — Ambulatory Visit (INDEPENDENT_AMBULATORY_CARE_PROVIDER_SITE_OTHER): Payer: Self-pay | Admitting: Internal Medicine

## 2016-05-15 VITALS — BP 130/90 | HR 90 | Resp 14 | Ht 64.5 in | Wt 249.0 lb

## 2016-05-15 DIAGNOSIS — M7989 Other specified soft tissue disorders: Secondary | ICD-10-CM

## 2016-05-15 DIAGNOSIS — R079 Chest pain, unspecified: Secondary | ICD-10-CM

## 2016-05-15 DIAGNOSIS — R0609 Other forms of dyspnea: Secondary | ICD-10-CM

## 2016-05-15 MED ORDER — ASPIRIN EC 81 MG PO TBEC: 81.0000 mg | DELAYED_RELEASE_TABLET | Freq: Every day | ORAL | Status: DC

## 2016-05-15 MED ORDER — ESOMEPRAZOLE MAGNESIUM 40 MG PO CPDR: 40.0000 mg | DELAYED_RELEASE_CAPSULE | Freq: Every day | ORAL | 11 refills | Status: DC

## 2016-05-15 MED FILL — ESOMEPRAZOLE MAG DR 40 MG C: 40 | 30 days supply | Qty: 30 | Fill #0

## 2016-05-15 NOTE — Progress Notes (Signed)
Subjective:    Patient ID: Brianna Hawkins, female    DOB: [birth year], 56 y.o.   MRN: [id number]  HPI   Feet and lower legs swelling:  Has never had this before.  Does have a bad right knee.  Feels her feet are equally involved.  Started 7 days ago.  Has noted pitting in her ankles. Was doing a lot of walking at [facility] the day the swelling was more prominent.  Was shopping for a uniform for her new job.   Did not really elevate legs in a way that was restful for her legs.  No recliner at home Describes a very sodium laden diet in past couple of weeks. Does feel shortness of breath with doing just about anything.  Has not worsened with time (3 years). Does have left sided chest pain every now and then. Has this when she really has significant shortness of breath and has to stop to sit down. Describes a sense that someone is sitting on her chest.   Lasts about 5 minutes.  No radiation.  Has the chest discomfort maybe 3 times weekly for about 1 year.  Has not increased in severity or frequency.   Shortness of breath may last 10 minutes after sitting. 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 No family history of heart disease. Had ED visit for CP in 12/2015:  EKG without findings of ischemia, CIE's negative for injury, Echo showed EF of 63% and mildly dilated LA CXR showed no cardiac abnormality  Patient gives history of being told she has asthma by Dr. Melvyn Novas a couple of years ago.  Looking at her chart from 10/2014, I do not see this as a diagnosis, in fact he describes no obstruction on 2013 PFTs.  She states he gave her a sample of Symbicort and she gave hers to her "old man", but that would have been in 2016.  She feels she needs a refill of the Symbicort. Looking at her chart, appears she has complained of these symptoms intermittently for years. 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  Current Meds  Medication Sig  . amLODipine (NORVASC) 10 MG tablet Take 1 tablet (10 mg total) by mouth daily.  . butalbital-acetaminophen-caffeine (FIORICET) 50-325-40 MG tablet Take 1-2 tablets by mouth every 6 (six) hours as needed for headache.  . fluticasone (FLONASE) 50 MCG/ACT nasal spray Place 2 sprays into both nostrils daily.  . hydrochlorothiazide (MICROZIDE) 12.5 MG capsule Take 1 capsule (12.5 mg total) by mouth daily.  Marland Kitchen ibuprofen (ADVIL,MOTRIN) 800 MG tablet TAKE 1 TABLET BY MOUTH EVERY 8 HOURS AS NEEDED  . Multiple Vitamins-Minerals (HAIR/SKIN/NAILS) TABS Take 1 tablet by mouth daily.  . rizatriptan (MAXALT-MLT) 10 MG disintegrating tablet Take 1 tablet (10 mg total) by mouth as needed. May repeat in 2 hours if needed        Review of Systems     Objective:   Physical Exam  NAD HEENT:  PERRL, EOMI, TMs pearly gray, throat without injection. Neck:  Supple, No adenopathy, no thyromegaly, No JVD Chest:  CTA CV:  RRR with normal S1 and S2, No S3, S4 or murmur.  Radial and DP pulses normal and equal.  No carotid bruits LE:  Quite obese.  Broken superficial veins quite prominent.  Mild pitting edema of feet and more so ankles.   EKG with NSR and no  findings to suggest ischemia or previous injury.     Assessment & Plan:  1.  Ankle swelling without evidence of CHF:  Check CBC, and BMP.  To take HCTZ 25 mg daily for next 3 days.  Get her legs elevated in reclined position when sitting. Avoid prepared meals/sodium  2.  Chest pain and dyspnea:  This is not progressive, but does have risk factors for heart disease including Hypertension, obesity, looking at old records, also prediabetes.  No family history of heart disease.  Referral to Cardiology for consideration for ischemic cardiac cause of her symptoms.

## 2016-05-15 NOTE — Patient Instructions (Addendum)
Take hydrochlorothiazide 2 tabs in the morning for 3 days. Avoid prepared and other salty meals Get your legs up and recline when you sit

## 2016-05-16 LAB — CBC WITH DIFFERENTIAL/PLATELET
BASOS ABS: 0 10*3/uL (ref 0.0–0.2)
Basos: 0 %
EOS (ABSOLUTE): 0.1 10*3/uL (ref 0.0–0.4)
Eos: 2 %
HEMOGLOBIN: 12.3 g/dL (ref 11.1–15.9)
Hematocrit: 36.5 % (ref 34.0–46.6)
IMMATURE GRANS (ABS): 0 10*3/uL (ref 0.0–0.1)
IMMATURE GRANULOCYTES: 0 %
LYMPHS: 41 %
Lymphocytes Absolute: 2.8 10*3/uL (ref 0.7–3.1)
MCH: 29.5 pg (ref 26.6–33.0)
MCHC: 33.7 g/dL (ref 31.5–35.7)
MCV: 88 fL (ref 79–97)
MONOCYTES: 7 %
Monocytes Absolute: 0.4 10*3/uL (ref 0.1–0.9)
NEUTROS ABS: 3.4 10*3/uL (ref 1.4–7.0)
NEUTROS PCT: 50 %
PLATELETS: 323 10*3/uL (ref 150–379)
RBC: 4.17 x10E6/uL (ref 3.77–5.28)
RDW: 14.8 % (ref 12.3–15.4)
WBC: 6.7 10*3/uL (ref 3.4–10.8)

## 2016-05-16 LAB — BASIC METABOLIC PANEL

## 2016-05-18 ENCOUNTER — Telehealth: Payer: Self-pay

## 2016-05-18 NOTE — Telephone Encounter (Signed)
Records rcvd in office from [facility] are already in [place]. Placed on your desk for further assistance. Not sure who pt is going to est with.

## 2016-05-22 ENCOUNTER — Ambulatory Visit: Payer: Self-pay | Admitting: Internal Medicine

## 2016-06-01 ENCOUNTER — Encounter: Payer: Self-pay | Admitting: Internal Medicine

## 2016-06-01 ENCOUNTER — Ambulatory Visit (INDEPENDENT_AMBULATORY_CARE_PROVIDER_SITE_OTHER): Payer: Self-pay | Admitting: Internal Medicine

## 2016-06-01 VITALS — BP 122/72 | HR 80 | Resp 12 | Ht 65.0 in | Wt 243.0 lb

## 2016-06-01 DIAGNOSIS — I10 Essential (primary) hypertension: Secondary | ICD-10-CM

## 2016-06-01 DIAGNOSIS — R519 Headache, unspecified: Secondary | ICD-10-CM

## 2016-06-01 DIAGNOSIS — M7050 Other bursitis of knee, unspecified knee: Secondary | ICD-10-CM

## 2016-06-01 DIAGNOSIS — M7989 Other specified soft tissue disorders: Secondary | ICD-10-CM

## 2016-06-01 DIAGNOSIS — R51 Headache: Secondary | ICD-10-CM

## 2016-06-01 DIAGNOSIS — M722 Plantar fascial fibromatosis: Secondary | ICD-10-CM

## 2016-06-01 DIAGNOSIS — R079 Chest pain, unspecified: Secondary | ICD-10-CM

## 2016-06-01 MED ORDER — DICLOFENAC SODIUM 75 MG PO TBEC: 75.0000 mg | DELAYED_RELEASE_TABLET | Freq: Two times a day (BID) | ORAL | 2 refills | Status: DC

## 2016-06-01 MED ORDER — HYDROCHLOROTHIAZIDE 25 MG PO TABS: 25.0000 mg | ORAL_TABLET | Freq: Every day | ORAL | 3 refills | Status: DC

## 2016-06-01 NOTE — Progress Notes (Signed)
   Subjective:    Patient ID: Brianna Hawkins, female    DOB: 20-Jun-1960, 56 y.o.   MRN: [id number]  HPI   1.  Leg Swelling:  Improved.  Taking HCTZ.  Has been elevating legs and reclining.  Is walking most of the time when on her feet.  Has lost 6 lbs since last visit.  2.  Chest Pain:  Better.  Has not heard from cardiology yet.  3.  Left heel hurts and pain in both knees:  Did get heel cups or cushions for shoes and doing exercises we discussed.  For her knees, she takes Aleve 4 tabs twice daily on an empty stomach.    Still lasts a short time.   Has never taken diclofenac.    Current Meds  Medication Sig  . amLODipine (NORVASC) 10 MG tablet Take 1 tablet (10 mg total) by mouth daily.  Marland Kitchen aspirin EC 81 MG tablet Take 1 tablet (81 mg total) by mouth daily.  . butalbital-acetaminophen-caffeine (FIORICET) 50-325-40 MG tablet Take 1-2 tablets by mouth every 6 (six) hours as needed for headache.  . esomeprazole (NEXIUM) 40 MG capsule Take 1 capsule (40 mg total) by mouth daily.  . fluticasone (FLONASE) 50 MCG/ACT nasal spray Place 2 sprays into both nostrils daily.  . hydrochlorothiazide (MICROZIDE) 12.5 MG capsule Take 1 capsule (12.5 mg total) by mouth daily. (Patient taking differently: Take 12.5 mg by mouth 2 (two) times daily. )  . ibuprofen (ADVIL,MOTRIN) 800 MG tablet TAKE 1 TABLET BY MOUTH EVERY 8 HOURS AS NEEDED  . Multiple Vitamins-Minerals (HAIR/SKIN/NAILS) TABS Take 1 tablet by mouth daily.  . rizatriptan (MAXALT-MLT) 10 MG disintegrating tablet Take 1 tablet (10 mg total) by mouth as needed. May repeat in 2 hours if needed    Allergies  Allergen Reactions  . Banana Itching    Of throat  . Watermelon [Citrullus Vulgaris]   . Tomato Rash     Review of Systems     Objective:   Physical Exam  Morbidly obese NAD Lungs:  CTA CV:  RRR without murmur or rub, radial pulses normal and equal MS:  Legs obese, tender bilaterally at pes anserine site.  Point tenderness over  plantar aspect of left calcaneus as well. Still with mild edema to pretibial areas.        Assessment & Plan:  1.  Bilateral Pes anserine bursitis and left plantar fasciitis, the latter not responding well to conservative treatment.  Referral to [facility] PT.  Weight likely a large contributor to fasciitis. Stop Aleve as utilizing too much and not controlling pain.  No other NSAID other than Diclofenac for now.    2.  LE edema:  Increase HCTZ to 25 mg in the morning.  BMP today.  Continue to work on [employer] loss--encouraged pool exercise.  3.  Chest Pain:  Improving with time, so less likely cardiac.  Await Cardiology referral through [facility] card.  4.  Essential Hypertension:  Controlled.  5.  Headache:  Brings up at end of visit:  Tension like--not her usual migraine.  Perhaps Diclofenac with help.

## 2016-06-02 LAB — BASIC METABOLIC PANEL
BUN / CREAT RATIO: 31 — AB (ref 9–23)
BUN: 21 mg/dL (ref 6–24)
CALCIUM: 9.5 mg/dL (ref 8.7–10.2)
CHLORIDE: 102 mmol/L (ref 96–106)
CO2: 24 mmol/L (ref 18–29)
Creatinine, Ser: 0.68 mg/dL (ref 0.57–1.00)
GFR calc non Af Amer: 100 mL/min/{1.73_m2} (ref >59)
GFR, EST AFRICAN AMERICAN: 115 mL/min/{1.73_m2} (ref >59)
GLUCOSE: 82 mg/dL (ref 65–99)
POTASSIUM: 3.7 mmol/L (ref 3.5–5.2)
Sodium: 144 mmol/L (ref 134–144)

## 2016-06-04 ENCOUNTER — Telehealth: Payer: Self-pay | Admitting: Internal Medicine

## 2016-06-04 NOTE — Telephone Encounter (Signed)
Patient has sinus infection, left side of her face is painful, stopped up and blowing green mucus could not go to work today.  Patient contact number [phone].  If can send in Rx send to [facility] on [address].

## 2016-06-04 NOTE — Telephone Encounter (Signed)
Spoke with patient states her symptoms started on Friday night with a scratchy throat and now has left side face pain and discharge coming from her nose. Per Dr. Amil Amen try Coricidin HBP and a netty pot and see if that helps. If she is not better by the end of the week then call the office. Patient verbalized understanding.

## 2016-06-18 MED FILL — ESOMEPRAZOLE MAG DR 40 MG C: 40 | 30 days supply | Qty: 30 | Fill #1

## 2016-07-23 MED FILL — ESOMEPRAZOLE MAG DR 40 MG C: 40 | 30 days supply | Qty: 30 | Fill #2

## 2016-08-01 IMAGING — CR DG ABDOMEN 1V
1 series · 1 of 1 positions shown · non-contrast
Comparison: CT abdomen and pelvis 02/14/2009. Abdominal x-ray
09/14/2008.

CLINICAL DATA: Two week history of left flank pain. Personal
history of urinary tract calculi.

EXAM:
ABDOMEN - 1 VIEW

[AP]
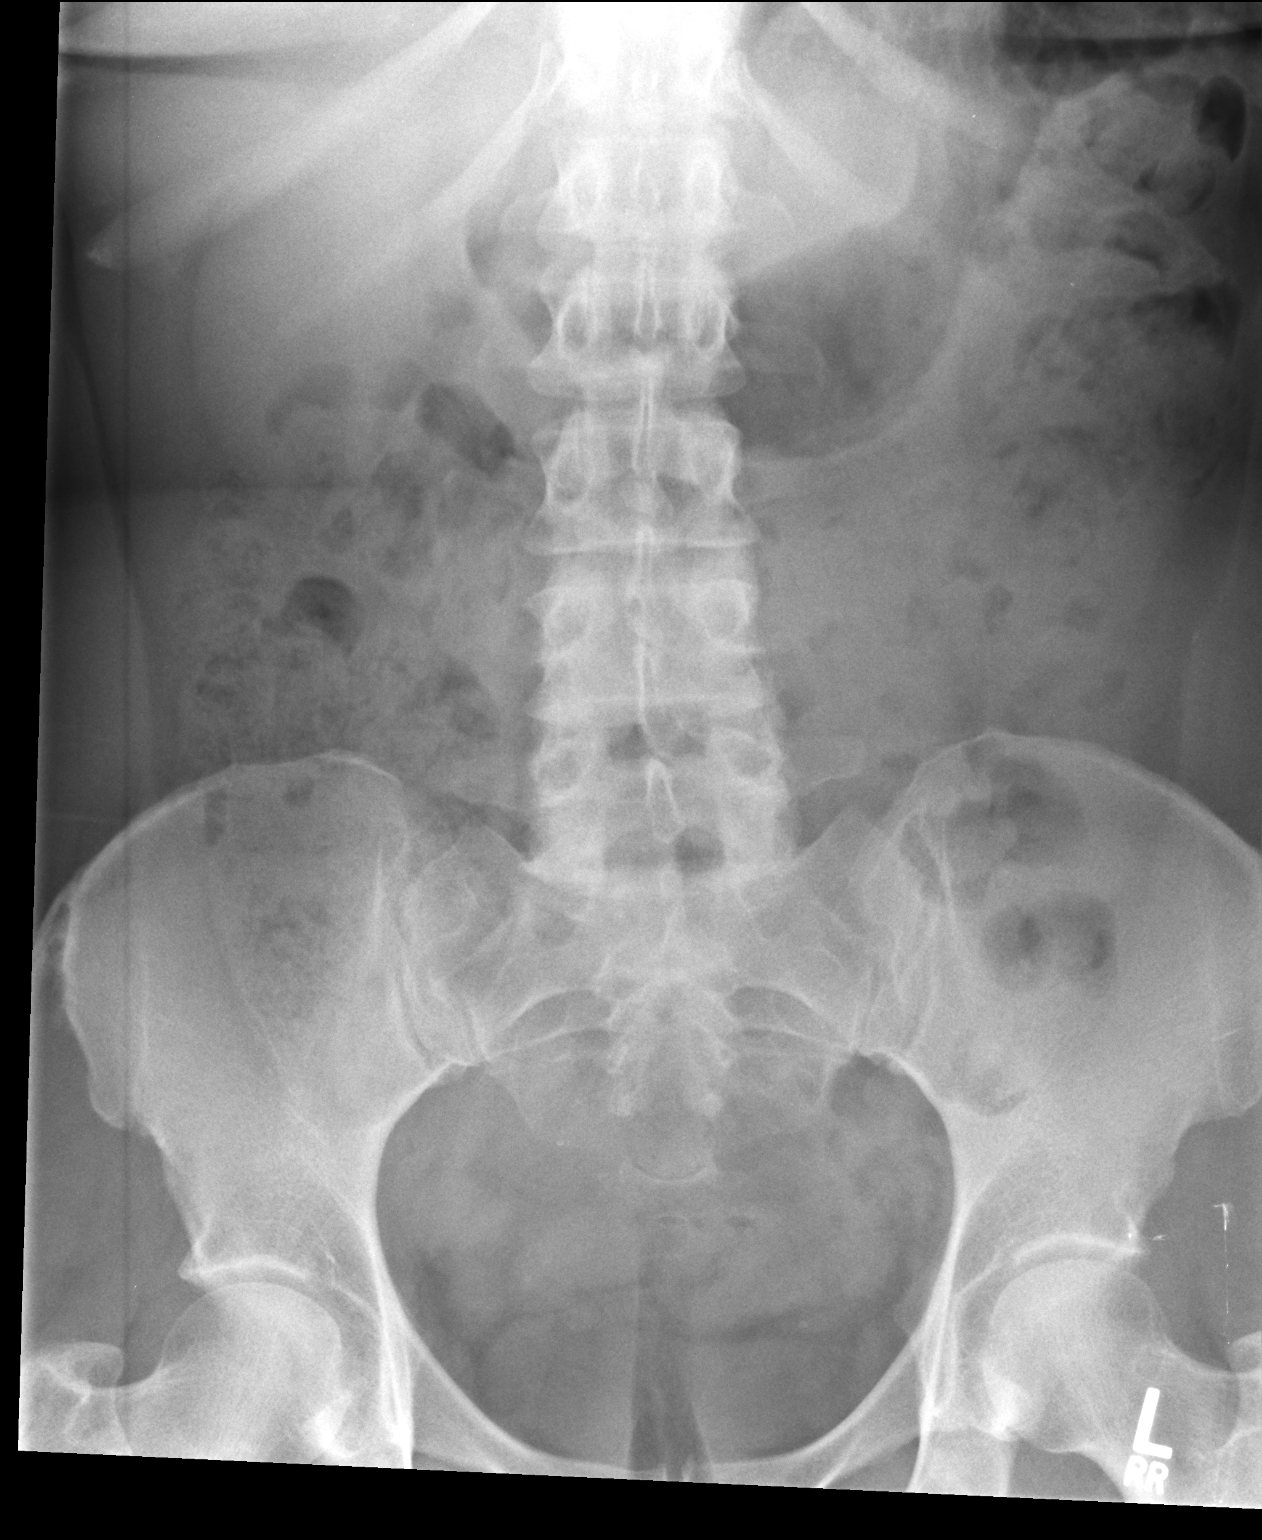

[1 of 1 positions shown; findings below may reference images not displayed]

FINDINGS: Bowel gas pattern unremarkable without evidence of obstruction or
significant ileus. Moderate stool burden in the colon. No visible
opaque urinary tract calculi. Phleboliths low in the left side of
the pelvis, unchanged. Regional skeleton intact.
IMPRESSION: No acute abdominal abnormality. No visible opaque urinary tract
calculi.

## 2016-08-01 IMAGING — CR DG SINUSES 1-2V
1 series · 1 of 1 positions shown · non-contrast
Comparison: Sinus CT scan 06/12/2013.

CLINICAL DATA: Left maxillary sinus pain for 2 weeks. Initial
encounter.

EXAM:
PARANASAL SINUSES - 1-2 VIEW

[waters]
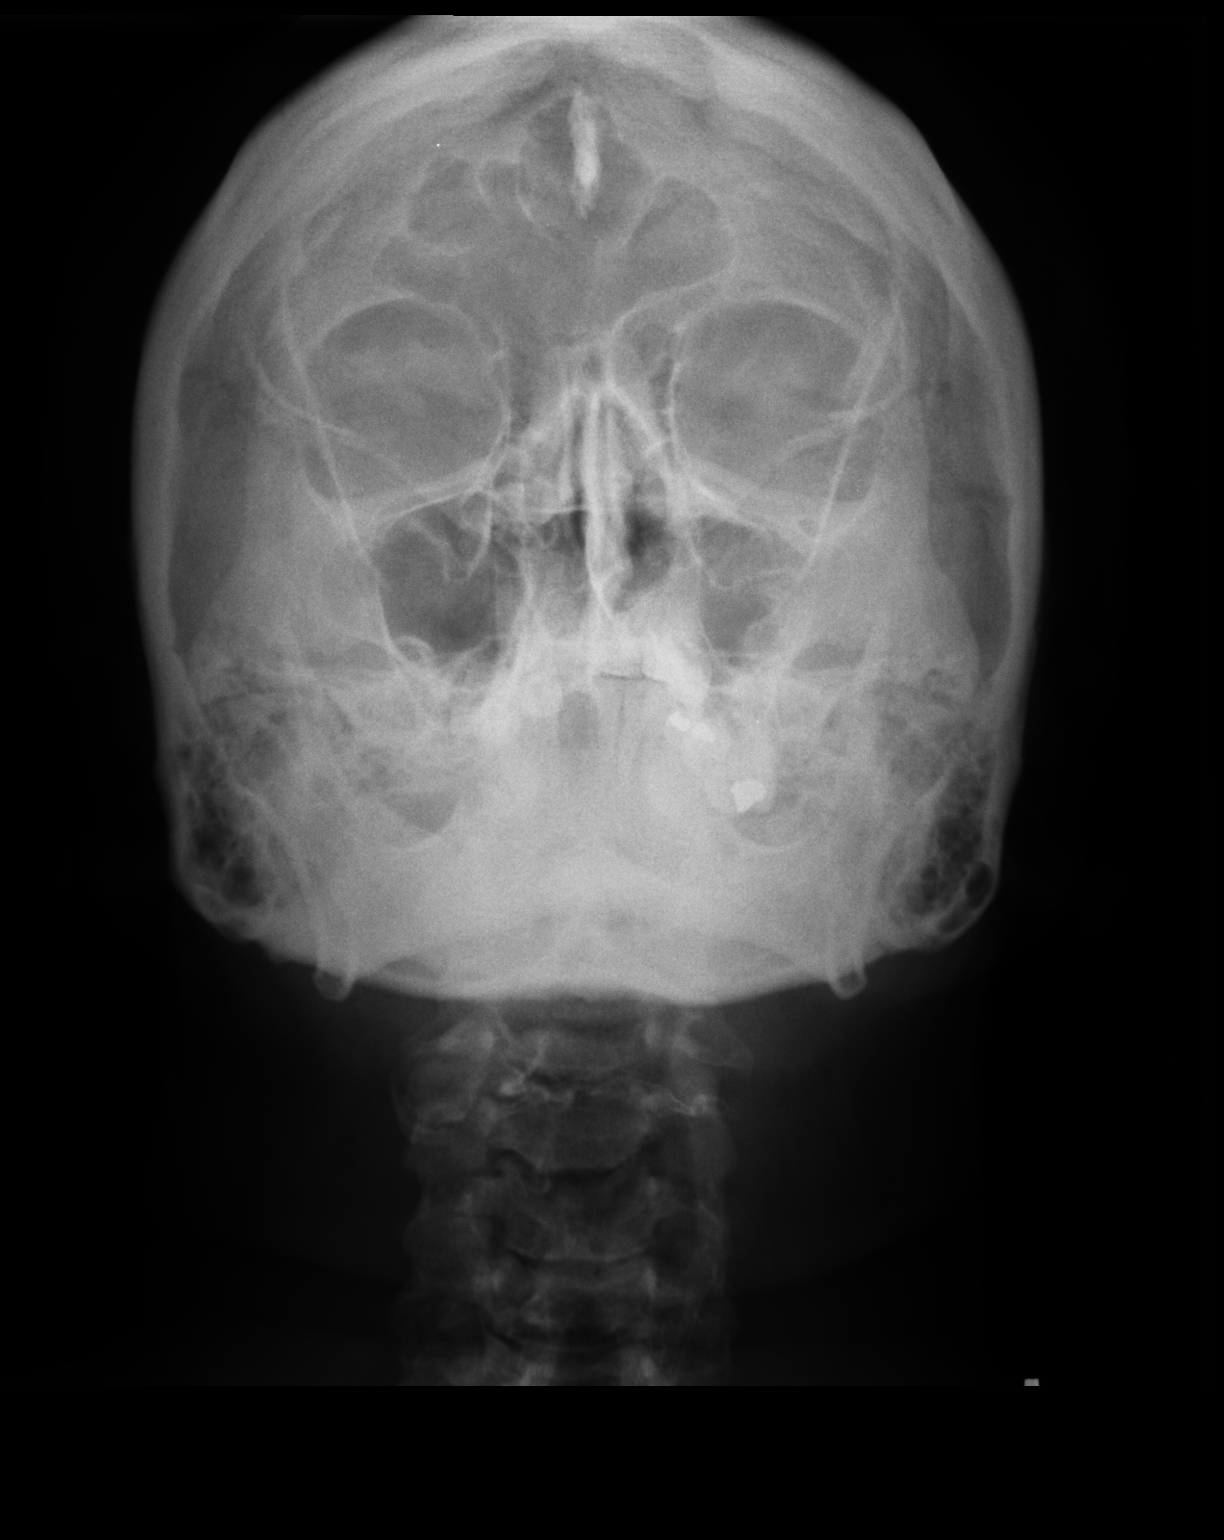

[1 of 1 positions shown; findings below may reference images not displayed]

FINDINGS: The left maxillary sinus appears completely opacified. Paranasal
sinuses otherwise appear clear. Mastoid air cells appear clear. No
focal bony abnormality is identified.
IMPRESSION: The left maxillary sinus appears completely opacified compatible
with the presence of a large mucocele as seen on the comparison
exam.

## 2016-08-28 MED FILL — ESOMEPRAZOLE MAG DR 40 MG C: 40 | 30 days supply | Qty: 30 | Fill #3

## 2016-09-04 ENCOUNTER — Encounter: Payer: Self-pay | Admitting: Internal Medicine

## 2016-09-04 ENCOUNTER — Ambulatory Visit (INDEPENDENT_AMBULATORY_CARE_PROVIDER_SITE_OTHER): Payer: Self-pay | Admitting: Internal Medicine

## 2016-09-04 VITALS — BP 142/98 | HR 88 | Resp 12 | Ht 65.0 in | Wt 240.0 lb

## 2016-09-04 DIAGNOSIS — R609 Edema, unspecified: Secondary | ICD-10-CM

## 2016-09-04 DIAGNOSIS — M722 Plantar fascial fibromatosis: Secondary | ICD-10-CM | POA: Insufficient documentation

## 2016-09-04 HISTORY — DX: Plantar fascial fibromatosis: M72.2
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
## 2016-10-04 MED FILL — ESOMEPRAZOLE MAG DR 40 MG C: 40 | 30 days supply | Qty: 30 | Fill #4

## 2016-10-28 ENCOUNTER — Encounter: Payer: Self-pay | Admitting: Internal Medicine

## 2016-10-30 NOTE — Progress Notes (Signed)
Referral has been sent to physical therapy. Left message with physical therapy to find out if patient has been seen

## 2016-11-08 MED FILL — ESOMEPRAZOLE MAG DR 40 MG C: 40 | 30 days supply | Qty: 30 | Fill #5

## 2016-12-04 ENCOUNTER — Ambulatory Visit: Payer: Self-pay | Admitting: Internal Medicine

## 2016-12-04 ENCOUNTER — Encounter: Payer: Self-pay | Admitting: Internal Medicine

## 2016-12-04 VITALS — BP 112/70 | HR 78 | Resp 14 | Ht 65.0 in | Wt 250.0 lb

## 2016-12-04 DIAGNOSIS — F32A Depression, unspecified: Secondary | ICD-10-CM

## 2016-12-04 DIAGNOSIS — M542 Cervicalgia: Secondary | ICD-10-CM

## 2016-12-04 DIAGNOSIS — I10 Essential (primary) hypertension: Secondary | ICD-10-CM

## 2016-12-04 DIAGNOSIS — F329 Major depressive disorder, single episode, unspecified: Secondary | ICD-10-CM

## 2016-12-04 DIAGNOSIS — R609 Edema, unspecified: Secondary | ICD-10-CM

## 2016-12-04 DIAGNOSIS — M722 Plantar fascial fibromatosis: Secondary | ICD-10-CM

## 2016-12-04 DIAGNOSIS — H547 Unspecified visual loss: Secondary | ICD-10-CM
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
## 2016-12-04 NOTE — Patient Instructions (Addendum)
Drink a glass of water before every meal Drink 6-8 glasses of water daily Eat three meals daily Eat a protein and healthy fat with every meal (eggs,fish, chicken, [place] and limit red meats) Eat 5 servings of vegetables daily, mix the colors Eat 2 servings of fruit daily with skin, if skin is edible Use smaller plates Put food/utensils down as you chew and swallow each bite Eat at a table with friends/family at least once daily, no TV Do not eat in front of the TV  Perform strengthening exercises for neck against head rest of car seat 10 x 10 counts twice daily Heating pad to neck and upper back while reading before bedtime and in the morning. Perform stretches:  Ear to shoulder, side of chin to same side chest.  Chin straight down to mid chest. Other side of chin to same side chest and other ear to same side shoulder. Do not let head fall back over shoulders. 10 repetitions for 10 counts in each position. Go to sleep. Sleep on side with pillow between knees and hips at 90 degrees.  Your special pillow to support your neck  Call when you are ready for PT for neck and feet again.

## 2016-12-18 ENCOUNTER — Ambulatory Visit: Payer: Self-pay | Admitting: Licensed Clinical Social Worker

## 2016-12-18 DIAGNOSIS — F439 Reaction to severe stress, unspecified: Secondary | ICD-10-CM

## 2016-12-18 MED FILL — ESOMEPRAZOLE MAG DR 40 MG C: 40 | 30 days supply | Qty: 30 | Fill #6
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
## 2017-01-08 ENCOUNTER — Other Ambulatory Visit: Payer: Self-pay | Admitting: Licensed Clinical Social Worker

## 2017-01-09 DIAGNOSIS — Z0271 Encounter for disability determination: Secondary | ICD-10-CM

## 2017-01-15 ENCOUNTER — Other Ambulatory Visit: Payer: Self-pay | Admitting: Licensed Clinical Social Worker

## 2017-03-05 ENCOUNTER — Ambulatory Visit: Payer: Medicaid Other | Admitting: Internal Medicine

## 2017-03-05 VITALS — BP 110/78 | HR 70 | Resp 14 | Ht 65.0 in | Wt 248.0 lb

## 2017-03-05 DIAGNOSIS — I10 Essential (primary) hypertension: Secondary | ICD-10-CM

## 2017-03-05 DIAGNOSIS — M545 Low back pain: Secondary | ICD-10-CM

## 2017-03-05 DIAGNOSIS — J0100 Acute maxillary sinusitis, unspecified: Secondary | ICD-10-CM

## 2017-03-05 DIAGNOSIS — M542 Cervicalgia: Secondary | ICD-10-CM

## 2017-03-05 DIAGNOSIS — M25561 Pain in right knee: Secondary | ICD-10-CM

## 2017-03-05 DIAGNOSIS — M722 Plantar fascial fibromatosis: Secondary | ICD-10-CM

## 2017-03-05 DIAGNOSIS — M25562 Pain in left knee: Secondary | ICD-10-CM

## 2017-03-05 DIAGNOSIS — G8929 Other chronic pain: Secondary | ICD-10-CM

## 2017-03-05 MED ORDER — AZITHROMYCIN 250 MG PO TABS: ORAL_TABLET | ORAL | 0 refills | Status: DC

## 2017-03-05 MED FILL — ESOMEPRAZOLE MAG DR 40 MG C: 40 | 30 days supply | Qty: 30 | Fill #7

## 2017-03-05 NOTE — Progress Notes (Signed)
Subjective:    Patient ID: Brianna Hawkins, female    DOB: [birth year], 57 y.o.   MRN: [id number]  HPI   1.  Stressors:  Her mother died last week.  Dealing with a brother she does not get along with.  Difficulties with her "old man"  Continues.  She states she just has not had time to continue with counseling. She discusses difficulties with her elderly female partner, but not clear she is interested in taking the time to clarify in her mind what she would like to do. He is apparently unwilling to allow any one else see his illness with breathing, so she is unable to ask for any help with his care.    2.  Neck pain and plantar fasciitis and knee pain:  Did utilize strengthening exercises in car for her neck.  Knee and plantar fasciitis pain are still problematic and ready for PT referral.   3. Sinus congestion, cough, posterior pharyngeal drainage for a couple of weeks.  Coughing up thick green drainage.  No fevers.  No dyspnea.  Current Meds  Medication Sig  . aspirin EC 81 MG tablet Take 1 tablet (81 mg total) by mouth daily.  . diclofenac (VOLTAREN) 75 MG EC tablet Take 1 tablet (75 mg total) by mouth 2 (two) times daily.  Marland Kitchen esomeprazole (NEXIUM) 40 MG capsule Take 1 capsule (40 mg total) by mouth daily.  . fluticasone (FLONASE) 50 MCG/ACT nasal spray Place 2 sprays into both nostrils daily.  . hydrochlorothiazide (HYDRODIURIL) 25 MG tablet Take 1 tablet (25 mg total) by mouth daily.  . Multiple Vitamins-Minerals (HAIR/SKIN/NAILS) TABS Take 1 tablet by mouth daily.  . rizatriptan (MAXALT-MLT) 10 MG disintegrating tablet Take 1 tablet (10 mg total) by mouth as needed. May repeat in 2 hours if needed  . [DISCONTINUED] amLODipine (NORVASC) 10 MG tablet Take 1 tablet (10 mg total) by mouth daily.   Allergies  Allergen Reactions  . Banana Itching    Of throat  . Watermelon [Citrullus Vulgaris]   . Tomato Rash      Review of Systems     Objective:   Physical Exam  NAD HEENT:  PERRL, EOMI, TMs pearly gray, tender over left maxillary sinus area. Throat without injection Neck:  Supple, No adenopathy Chest:  CTA CV:  RRR without murmur or rub, radial pulses normal and equal MS:  Tender across both traps and cervical paraspinous musculature to nuchal ridge.   Low back:  Tender right paraspinous musculature and more so areas of point tenderness in right low buttock and over greater trochanter. Tender over right patella, but no swelling, effusion.  Really nontender over joint lines.  Tender at pes anserine area as well. Tender over plantar heel bilaterally Neuro:  Motor 5/5 throughout, DTRs 2+/4 throughout.  Gait normal        Assessment & Plan:  1.  Mom and sister with "weak hearts"  That were missed.  She is uncertain as to what the diagnoses are.  Brings this up as leaving today.  Discussed she needs to get more information and let me know at next visit.  2.  Neck, back, knees and plantar fasciitis:  PT referral.  Financial assistance with [facility] and if obtains--xray of right knee.  She will let me know if she has the assistance for the xray.  3.  Sinusitis:  Brings this up at end of visit as well.  Zpack and supportive care.  4.  Home situation:  Again, encouraged her to speak with Macie Burows, LCSW as she seems to feel her life is not her own, but so far has not moved toward sorting out her goals to improve her situation.  Am concerned for her partner's young daughter, who is also limited by his control/illness.

## 2017-03-07 ENCOUNTER — Other Ambulatory Visit: Payer: Self-pay

## 2017-03-07 DIAGNOSIS — I10 Essential (primary) hypertension: Secondary | ICD-10-CM

## 2017-03-07 MED ORDER — AMLODIPINE BESYLATE 10 MG PO TABS: 10.0000 mg | ORAL_TABLET | Freq: Every day | ORAL | 11 refills | Status: DC

## 2017-03-07 MED ORDER — AMLODIPINE BESYLATE 10 MG PO TABS: 10.0000 mg | ORAL_TABLET | Freq: Every day | ORAL | 1 refills | Status: DC

## 2017-03-07 MED FILL — AMLODIPINE BESYLATE 10 MG T: 10 | 30 days supply | Qty: 30 | Fill #0

## 2017-03-10 ENCOUNTER — Encounter: Payer: Self-pay | Admitting: Internal Medicine

## 2017-04-09 MED FILL — AMLODIPINE BESYLATE 10 MG T: 10 | 30 days supply | Qty: 30 | Fill #1

## 2017-04-18 IMAGING — DX DG CHEST 2V
2 series · 2 of 2 positions shown · non-contrast
Comparison: 11/04/2014

CLINICAL DATA: Dull chest pain for 1 month, initial encounter

EXAM:
CHEST  2 VIEW

[chest pa]
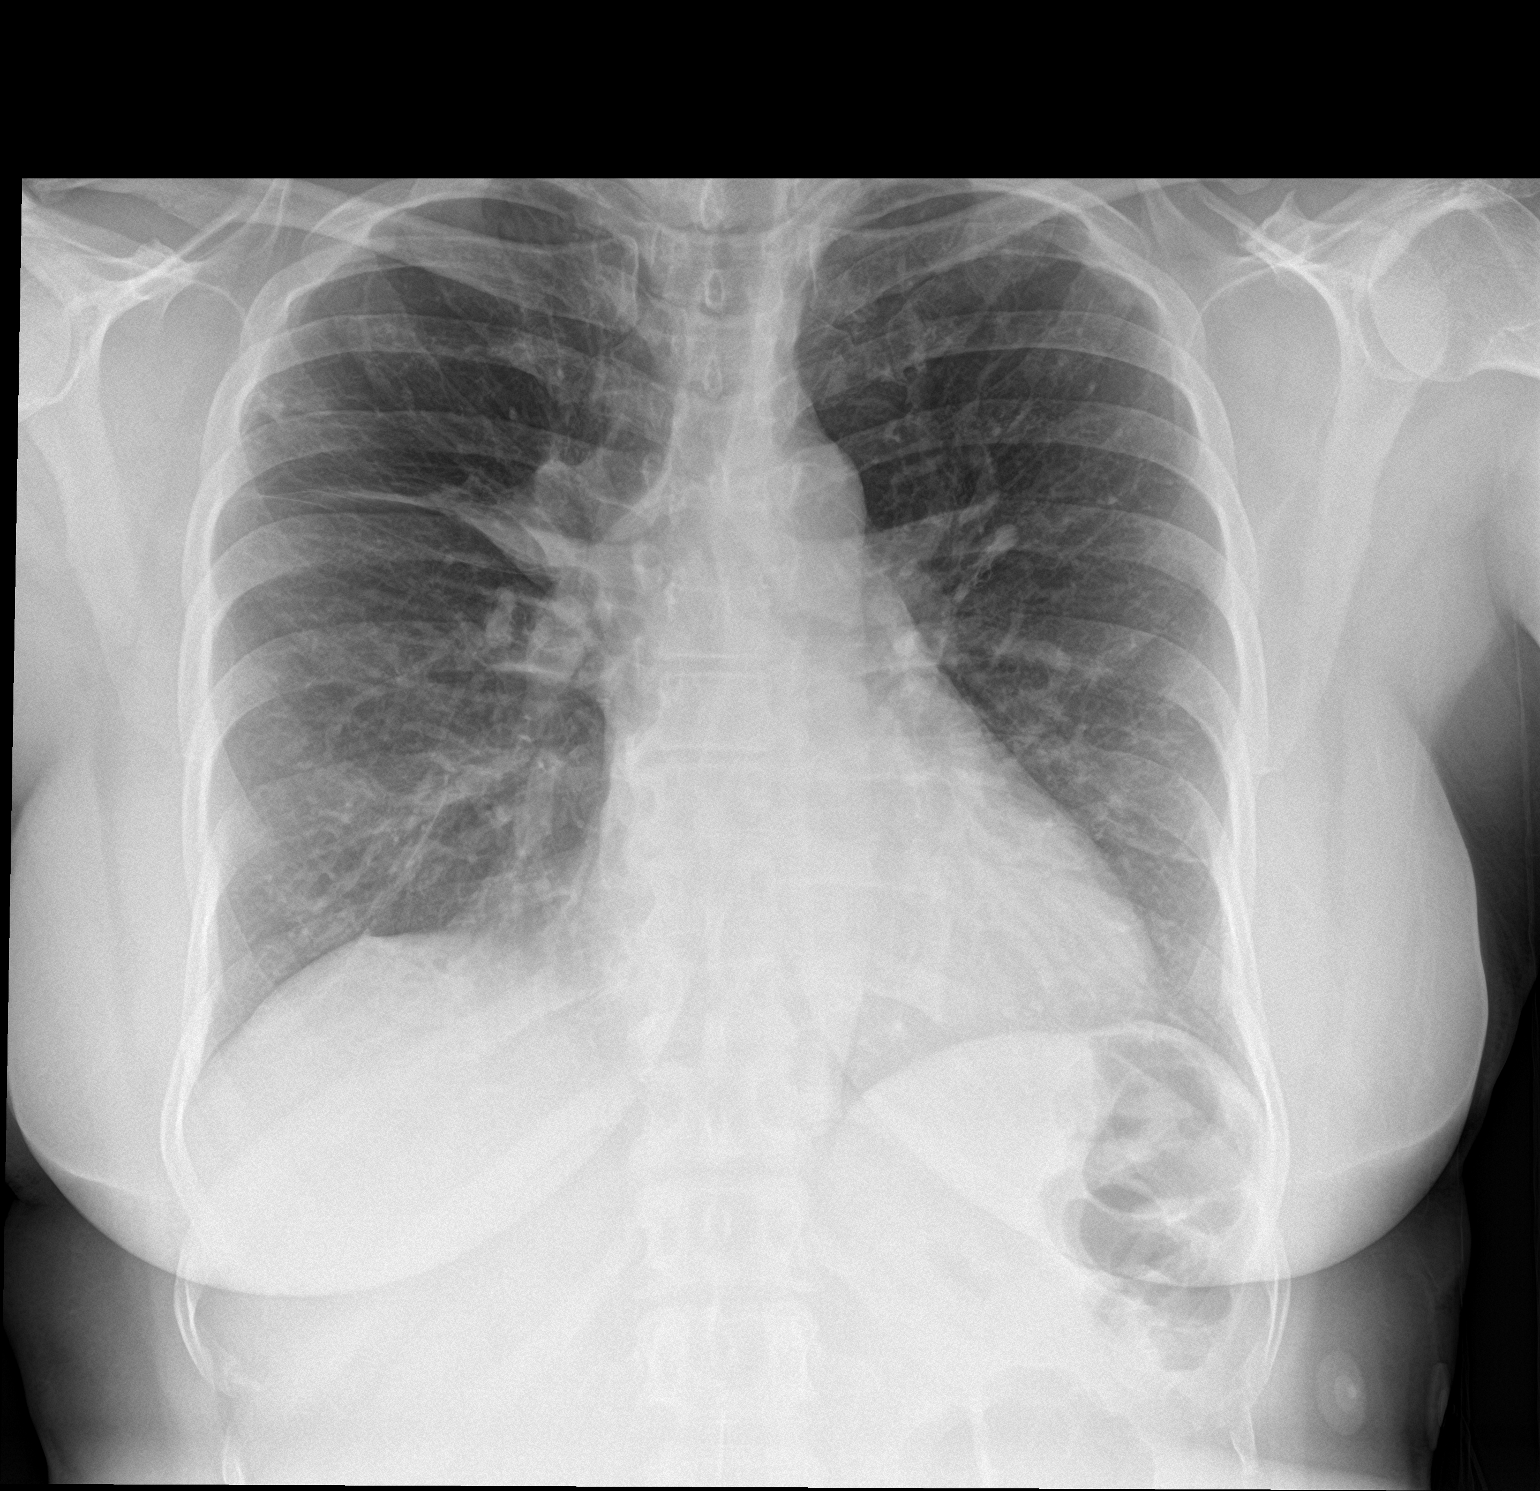

[chest lat]
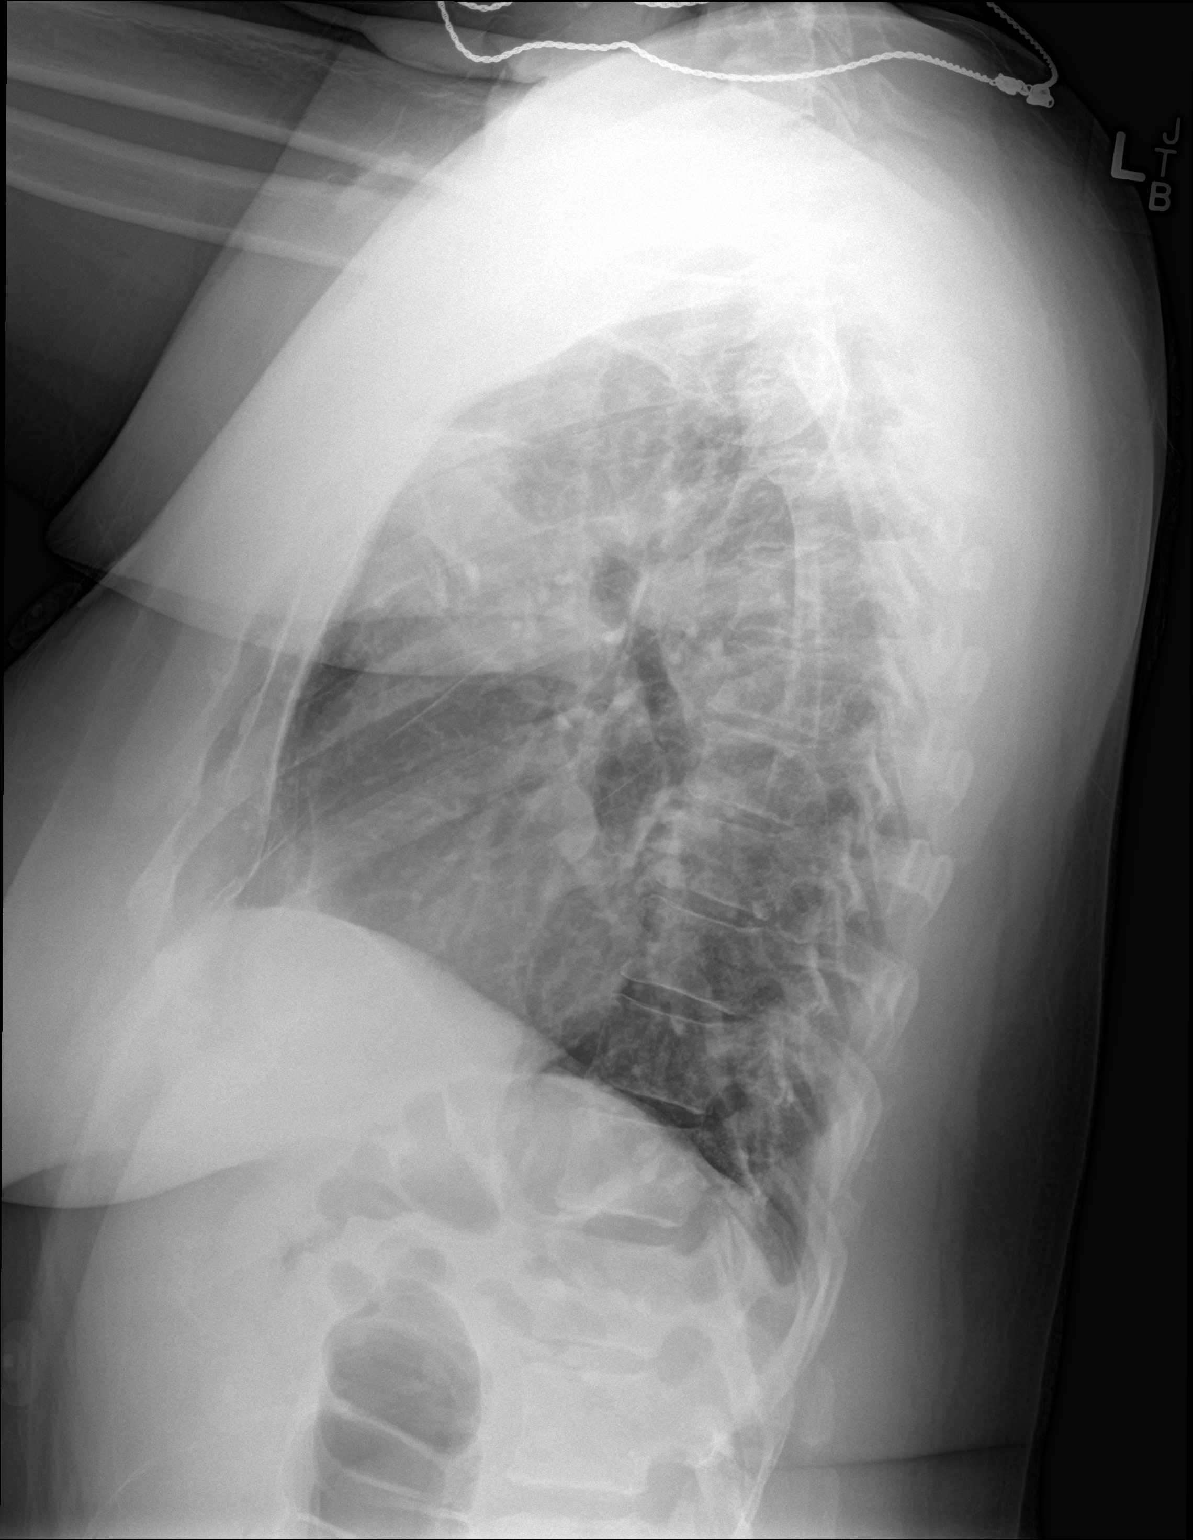

[2 of 2 positions shown; findings below may reference images not displayed]

FINDINGS: Cardiac shadow is stable. Stable linear scarring in the right upper
lobe is noted. Some nodularity is noted laterally in the upper lobe
similar to that seen on prior exams and stable. No new focal
infiltrate or sizable effusion is seen. No acute bony abnormality is
noted.
IMPRESSION: Stable changes in the right upper lobe when compared with the prior
exam.

No new focal abnormality is seen.

## 2017-05-07 ENCOUNTER — Ambulatory Visit: Payer: Medicaid Other | Admitting: Internal Medicine

## 2017-05-24 ENCOUNTER — Other Ambulatory Visit: Payer: Self-pay

## 2017-05-24 ENCOUNTER — Telehealth: Payer: Self-pay | Admitting: Internal Medicine

## 2017-05-24 DIAGNOSIS — I10 Essential (primary) hypertension: Secondary | ICD-10-CM

## 2017-05-24 MED ORDER — AMLODIPINE BESYLATE 10 MG PO TABS: 10.0000 mg | ORAL_TABLET | Freq: Every day | ORAL | 1 refills | Status: DC

## 2017-05-24 MED ORDER — HYDROCHLOROTHIAZIDE 25 MG PO TABS: 25.0000 mg | ORAL_TABLET | Freq: Every day | ORAL | 1 refills | Status: DC

## 2017-05-24 MED FILL — AMLODIPINE BESYLATE 10 MG T: 10 | 30 days supply | Qty: 30 | Fill #0

## 2017-05-24 NOTE — Telephone Encounter (Signed)
Rx's sent to pharmacy.  

## 2017-05-24 NOTE — Telephone Encounter (Signed)
Patient called stating needs a Rx on amlodipine 10 mg and will like this prescription be sent to [facility], [place]; and also needs Rx on hydrochlorothiazide 25 mg and will like that one be sent to [place], [place] - [address]  Please advise.

## 2017-05-31 ENCOUNTER — Ambulatory Visit: Payer: Medicaid Other | Admitting: Internal Medicine

## 2017-06-11 ENCOUNTER — Other Ambulatory Visit: Payer: Self-pay | Admitting: Internal Medicine

## 2017-06-12 MED FILL — ESOMEPRAZOLE MAG DR 40 MG C: 40 | 30 days supply | Qty: 30 | Fill #0

## 2017-07-01 ENCOUNTER — Encounter: Payer: Self-pay | Admitting: Internal Medicine

## 2017-07-01 ENCOUNTER — Ambulatory Visit: Payer: Medicaid Other | Admitting: Internal Medicine

## 2017-07-01 VITALS — BP 110/78 | HR 78 | Resp 12 | Ht 65.0 in | Wt 250.0 lb

## 2017-07-01 DIAGNOSIS — R079 Chest pain, unspecified: Secondary | ICD-10-CM

## 2017-07-01 NOTE — Progress Notes (Signed)
Subjective:    Patient ID: Brianna Hawkins, female    DOB: 1960-10-09, 57 y.o.   MRN: [id number]  HPI   Concerned with family history of heart disease.  Sister, died at age 90 yo, with heart disease, though patient states she was diagnosed with a specific heart disease.  She states it is something that the first born may develop and it is inherited.  She does believe her sister's daughter or her significant other may know the actual diagnosis.  States her sister also had a stroke a week before she died in August 28, 2016.  Her sister did have high blood pressure that was likely not well controlled.  She did not eat in a healthy way--with lots of grease and salt.   She also had diabetes. She was also an alcoholic. She is unaware if she had high cholesterol.  Mother, was 31 when she died.  She died of aortic valve stenosis.  She did not have rheumatic fever as a child per patient.  Patient states her mother had melanoma and breast cancer.  Mother also had DVTs in 2014 for which she took Warfarin.  Melanoma was diagnosed in 2011 and states it was "eating away her skin,"  For some time, but at time of death, her skin appeared healed.  Diagnosed with the breast cancer in 2017 and underwent surgery for that.  She did not receive any adjuvant treatment.  Finally, patient shares,  Three weeks ago, had a very sharp pain in her chest that she has never had before.  Points to high left chest.  Lasted about 5 minutes.  She was at rest, sitting on sofa.   She had not done anything to exert herself that day.  Had been sitting for some time watching TV.  No radiation to her shoulder, jaw, or back.  No dyspnea associated with this.   Has had swelling of ankles at times maybe for about 2 months.   Sleeps on 4 pillows for her GERD.  No problem otherwise sleeping flat. She states she does wake sometimes dyspneic and has to sit up on edge of bed to catch her breath.  Happens maybe once weekly. She is taking an  aspirin 81 mg daily. Other risk factors include mild hypercholesterolemia, history of prediabetes, obesity, distant minimal history of smoking 4.5 pack year history, hypertension currently well controlled. She did have an echo in 2012 with EF of 63%, mildly dilated LA, and no valvular abnormality. ECG 05/2016 without ischemic or other concerning changes.   Review of Systems     Objective:   Physical Exam NAD HEENT:  PERRL, EOMI,  Neck:  Supple, no adenopathy, mild thyromegay Chest:  CTA and resonant to percussion.  Tender over left anterior chest in same area of pain, but states does not reproduce the same pain. CV: RRR with normal S1 and S2, No S3, or S4.  No definite murmur.  No rub. No JVD.  No carotid bruits.  Carotid, radial, DP and PT pulses normal and equal. Abd:  S, NT, No HSM or mass + BS LE:  No edema    Assessment & Plan:  1.  Chest pain with symptoms of possible CHF:  ECG essentially without change from 05/2016, though baseline interference.  No findings of CHF on exam today Risk factors of possible family history with sister, distant and limited smoking history, obesity, prediabetes, controlled hypertension. Has not had recurrence in 3 weeks. CXR, CBC, CMP, TSH, FLP, A1C in  morning tomorrow fasting. Continue ASA and bp control Cardiology referral

## 2017-07-02 ENCOUNTER — Other Ambulatory Visit: Payer: Self-pay

## 2017-07-02 DIAGNOSIS — Z79899 Other long term (current) drug therapy: Secondary | ICD-10-CM

## 2017-07-02 DIAGNOSIS — Z13220 Encounter for screening for lipoid disorders: Secondary | ICD-10-CM

## 2017-07-02 DIAGNOSIS — R739 Hyperglycemia, unspecified: Secondary | ICD-10-CM

## 2017-07-03 LAB — COMPREHENSIVE METABOLIC PANEL
ALK PHOS: 90 IU/L (ref 39–117)
ALT: 13 IU/L (ref 0–32)
AST: 19 IU/L (ref 0–40)
Albumin/Globulin Ratio: 1.5 (ref 1.2–2.2)
Albumin: 3.7 g/dL (ref 3.5–5.5)
BUN / CREAT RATIO: 23 (ref 9–23)
BUN: 16 mg/dL (ref 6–24)
Bilirubin Total: 0.4 mg/dL (ref 0.0–1.2)
CO2: 24 mmol/L (ref 20–29)
CREATININE: 0.69 mg/dL (ref 0.57–1.00)
Calcium: 9.1 mg/dL (ref 8.7–10.2)
Chloride: 110 mmol/L — ABNORMAL HIGH (ref 96–106)
GFR calc Af Amer: 114 mL/min/{1.73_m2} (ref >59)
GFR, EST NON AFRICAN AMERICAN: 99 mL/min/{1.73_m2} (ref >59)
GLOBULIN, TOTAL: 2.4 g/dL (ref 1.5–4.5)
GLUCOSE: 131 mg/dL — AB (ref 65–99)
Potassium: 4.3 mmol/L (ref 3.5–5.2)
SODIUM: 146 mmol/L — AB (ref 134–144)
Total Protein: 6.1 g/dL (ref 6.0–8.5)

## 2017-07-03 LAB — CBC WITH DIFFERENTIAL/PLATELET
BASOS ABS: 0 10*3/uL (ref 0.0–0.2)
Basos: 1 %
EOS (ABSOLUTE): 0.1 10*3/uL (ref 0.0–0.4)
Eos: 2 %
Hematocrit: 36.6 % (ref 34.0–46.6)
Hemoglobin: 11.9 g/dL (ref 11.1–15.9)
Immature Grans (Abs): 0 10*3/uL (ref 0.0–0.1)
Immature Granulocytes: 0 %
LYMPHS ABS: 2.1 10*3/uL (ref 0.7–3.1)
Lymphs: 41 %
MCH: 29.2 pg (ref 26.6–33.0)
MCHC: 32.5 g/dL (ref 31.5–35.7)
MCV: 90 fL (ref 79–97)
MONOS ABS: 0.4 10*3/uL (ref 0.1–0.9)
Monocytes: 8 %
Neutrophils Absolute: 2.5 10*3/uL (ref 1.4–7.0)
Neutrophils: 48 %
PLATELETS: 277 10*3/uL (ref 150–450)
RBC: 4.07 x10E6/uL (ref 3.77–5.28)
RDW: 14.5 % (ref 12.3–15.4)
WBC: 5 10*3/uL (ref 3.4–10.8)

## 2017-07-03 LAB — LIPID PANEL W/O CHOL/HDL RATIO
Cholesterol, Total: 201 mg/dL — ABNORMAL HIGH (ref 100–199)
HDL: 44 mg/dL (ref >39)
LDL CALC: 141 mg/dL — AB (ref 0–99)
TRIGLYCERIDES: 80 mg/dL (ref 0–149)
VLDL Cholesterol Cal: 16 mg/dL (ref 5–40)

## 2017-07-03 LAB — HGB A1C W/O EAG: HEMOGLOBIN A1C: 6.5 % — AB (ref 4.8–5.6)

## 2017-07-03 LAB — TSH: TSH: 1.15 u[IU]/mL (ref 0.450–4.500)

## 2017-07-03 MED ORDER — ATORVASTATIN CALCIUM 20 MG PO TABS
ORAL_TABLET | ORAL | 11 refills | Status: DC
Start: 2017-07-03 — End: 2017-09-24

## 2017-07-03 MED ORDER — METFORMIN HCL ER 500 MG PO TB24: ORAL_TABLET | ORAL | 11 refills | Status: DC

## 2017-07-03 NOTE — Addendum Note (Signed)
Addended by: Marcelino Duster on: 07/03/2017 09:01 AM   Modules accepted: Orders

## 2017-07-03 NOTE — Progress Notes (Signed)
Adding meds for new onset DM and hypercholesterolemia

## 2017-08-07 ENCOUNTER — Encounter: Payer: Self-pay | Admitting: Internal Medicine

## 2017-08-07 ENCOUNTER — Ambulatory Visit: Payer: Medicaid Other | Admitting: Internal Medicine

## 2017-08-07 VITALS — BP 122/72 | HR 88 | Resp 14 | Ht 65.0 in | Wt 250.0 lb

## 2017-08-07 DIAGNOSIS — R079 Chest pain, unspecified: Secondary | ICD-10-CM

## 2017-08-07 DIAGNOSIS — E119 Type 2 diabetes mellitus without complications: Secondary | ICD-10-CM

## 2017-08-07 LAB — GLUCOSE, POCT (MANUAL RESULT ENTRY): POC GLUCOSE: 112 mg/dL — AB (ref 70–99)

## 2017-08-07 MED ORDER — GLUCOSE BLOOD VI STRP: ORAL_STRIP | 11 refills | Status: AC

## 2017-08-07 MED ORDER — AGAMATRIX PRESTO W/DEVICE KIT: PACK | 0 refills | Status: AC

## 2017-08-07 MED ORDER — NITROGLYCERIN 0.4 MG SL SUBL: SUBLINGUAL_TABLET | SUBLINGUAL | 3 refills | Status: AC

## 2017-08-07 MED ORDER — AGAMATRIX ULTRA-THIN LANCETS MISC: 11 refills | Status: AC

## 2017-08-07 NOTE — Progress Notes (Signed)
   Subjective:    Patient ID: Brianna Hawkins, female    DOB: July 18, 1960, 56 y.o.   MRN: [id number]  HPI    New onset diabetes:  Had eye check 2-3 months ago.  She was prescribed bifocals.  Still with some blurriness.  She is not wearing glasses regularly. Tolerating Metformin ER 500 mg twice daily well.  Has been taking for just over one month.   She has been checking sugars.  She did not bring in her glucometer and did not write them down.   She cannot remember the numbers she was achieving.   Sounds like getting below 100 frequently.  Has not checked in past 3 days.  Family history of heart disease.  Has not really had an episode of chest pain as she did before last visit, but states she can "feel her heart"  Sometimes a little pressure in left chest--gets at night when lying in bed.  Has been occurring every night.  Difficult historian with this.  Current Meds  Medication Sig  . amLODipine (NORVASC) 10 MG tablet Take 1 tablet (10 mg total) by mouth daily.  Marland Kitchen aspirin EC 81 MG tablet Take 1 tablet (81 mg total) by mouth daily.  Marland Kitchen esomeprazole (NEXIUM) 40 MG capsule TAKE 1 CAPSULE (40 MG TOTAL) BY MOUTH DAILY.  . fluticasone (FLONASE) 50 MCG/ACT nasal spray Place 2 sprays into both nostrils daily.  . metFORMIN (GLUCOPHAGE-XR) 500 MG 24 hr tablet 1 tab by mouth twice daily with meals  . Multiple Vitamins-Minerals (HAIR/SKIN/NAILS) TABS Take 1 tablet by mouth daily.  . rizatriptan (MAXALT-MLT) 10 MG disintegrating tablet Take 1 tablet (10 mg total) by mouth as needed. May repeat in 2 hours if needed  . [DISCONTINUED] atorvastatin (LIPITOR) 20 MG tablet 1 tab by mouth with evening meal daily  . [DISCONTINUED] hydrochlorothiazide (HYDRODIURIL) 25 MG tablet Take 1 tablet (25 mg total) by mouth daily.    Allergies  Allergen Reactions  . Banana Itching    Of throat  . Watermelon [Citrullus Vulgaris]   . Tomato Rash   Review of Systems     Objective:   Physical Exam NAD Lungs:   CTA CV:  RRR without murmur or rub.  Radial and DP pulses normal and equal. Abd:  S, NT, No HSM or mass, + BS LE:  No edema.       Assessment & Plan:  1.  DM:  New diagnosis.  Tolerating Metformin.  Order for glucometer and related supplies.  Discussed checking sugars before meals twice daily.   To bring diary in in 3 months with followup.  2.  Chest pain:  Awaiting cardiology referral.  NTG SL as needed.  BP controlled

## 2017-08-12 MED FILL — ESOMEPRAZOLE MAG DR 40 MG C: 40 | 30 days supply | Qty: 30 | Fill #1

## 2017-08-27 ENCOUNTER — Other Ambulatory Visit: Payer: Self-pay | Admitting: Internal Medicine

## 2017-08-27 ENCOUNTER — Ambulatory Visit: Payer: 59 | Admitting: Family Medicine

## 2017-08-28 ENCOUNTER — Telehealth: Payer: Self-pay | Admitting: *Deleted

## 2017-08-28 ENCOUNTER — Encounter

## 2017-08-28 ENCOUNTER — Encounter: Payer: Self-pay | Admitting: Physician Assistant

## 2017-08-28 ENCOUNTER — Ambulatory Visit (INDEPENDENT_AMBULATORY_CARE_PROVIDER_SITE_OTHER): Payer: Self-pay | Admitting: Physician Assistant

## 2017-08-28 VITALS — BP 130/84 | HR 81 | Ht 65.0 in | Wt 245.5 lb

## 2017-08-28 DIAGNOSIS — R0602 Shortness of breath: Secondary | ICD-10-CM

## 2017-08-28 DIAGNOSIS — E119 Type 2 diabetes mellitus without complications: Secondary | ICD-10-CM

## 2017-08-28 DIAGNOSIS — R0789 Other chest pain: Secondary | ICD-10-CM

## 2017-08-28 DIAGNOSIS — M79605 Pain in left leg: Secondary | ICD-10-CM

## 2017-08-28 DIAGNOSIS — E785 Hyperlipidemia, unspecified: Secondary | ICD-10-CM

## 2017-08-28 DIAGNOSIS — M79604 Pain in right leg: Secondary | ICD-10-CM

## 2017-08-28 DIAGNOSIS — I10 Essential (primary) hypertension: Secondary | ICD-10-CM

## 2017-08-28 LAB — BASIC METABOLIC PANEL
BUN/Creatinine Ratio: 22 (ref 9–23)
BUN: 16 mg/dL (ref 6–24)
CO2: 23 mmol/L (ref 20–29)
Calcium: 9.4 mg/dL (ref 8.7–10.2)
Chloride: 104 mmol/L (ref 96–106)
Creatinine, Ser: 0.72 mg/dL (ref 0.57–1.00)
GFR, EST AFRICAN AMERICAN: 110 mL/min/{1.73_m2} (ref >59)
GFR, EST NON AFRICAN AMERICAN: 95 mL/min/{1.73_m2} (ref >59)
Glucose: 99 mg/dL (ref 65–99)
POTASSIUM: 4 mmol/L (ref 3.5–5.2)
SODIUM: 145 mmol/L — AB (ref 134–144)

## 2017-08-28 LAB — PRO B NATRIURETIC PEPTIDE: NT-Pro BNP: 32 pg/mL (ref 0–249)

## 2017-08-28 MED ORDER — METOPROLOL TARTRATE 50 MG PO TABS: 50.0000 mg | ORAL_TABLET | Freq: Once | ORAL | 0 refills | Status: DC

## 2017-08-28 NOTE — Telephone Encounter (Signed)
-----   Message from Liliane Shi, [place] sent at 08/28/2017  5:28 PM EDT ----- All values are normal or within acceptable limits.   Medication changes / Follow up labs / Other changes or recommendations:    - Continue current medications and follow up as planned.  Richardson Dopp, PA-C 08/28/2017 5:28 PM

## 2017-08-28 NOTE — Telephone Encounter (Signed)
I have left a message for pt to call back to go over lab results.

## 2017-08-28 NOTE — Patient Instructions (Addendum)
Medication Instructions:  1. Your physician recommends that you continue on your current medications as directed. Please refer to the Current Medication list given to you today.   Labwork: TODAY BMET, PRO BNP  Testing/Procedures: 1. SCHEDULE A CORONARY CTA W/FFR TO BE DONE AT [facility]  2. Your physician has requested that you have an echocardiogram. Echocardiography is a painless test that uses sound waves to create images of your heart. It provides your doctor with information about the size and shape of your heart and how well your heart's chambers and valves are working. This procedure takes approximately one hour. There are no restrictions for this procedure.    Follow-Up: [facility], PAC 4 WEEKS AFTER ALL TESTING HAS BEEN COMPLETED  Any Other Special Instructions Will Be Listed Below (If Applicable).  If you need a refill on your cardiac medications before your next appointment, please call your pharmacy.   CT INSTRUCTIONS:  Please arrive at the [facility] main entrance of [facility] at xx:xx AM (30-45 minutes prior to test start time)  [facility] [place], [place] [address] [phone]  Proceed to the [facility] Radiology Department (First Floor).  Please follow these instructions carefully (unless otherwise directed):  On the Night Before the Test: . Drink plenty of water. . Do not consume any caffeinated/decaffeinated beverages or chocolate 12 hours prior to your test. . Do not take any antihistamines 12 hours prior to your test. . If you take Metformin do not take 24 hours prior to test.  . If the patient has contrast allergy: ? Patient will need a prescription for Prednisone and very clear instructions (as follows): 1. Prednisone 50 mg - take 13 hours prior to test 2. Take another Prednisone 50 mg 7 hours prior to test 3. Take another Prednisone 50 mg 1 hour prior to test 4. Take Benadryl 50 mg 1 hour prior  to test . Patient must complete all four doses of above prophylactic medications. . Patient will need a ride after test due to Benadryl.  On the Day of the Test: . Drink plenty of water. Do not drink any water within one hour of the test. . Do not eat any food 4 hours prior to the test. . You may take your regular medications prior to the test. . IF NOT ON A BETA BLOCKER - Take 50 mg of lopressor (metoprolol) one hour before the test. . HOLD HCTZ morning of the test.  After the Test: . Drink plenty of water. . After receiving IV contrast, you may experience a mild flushed feeling. This is normal. . On occasion, you may experience a mild rash up to 24 hours after the test. This is not dangerous. If this occurs, you can take Benadryl 25 mg and increase your fluid intake. . If you experience trouble breathing, this can be serious. If it is severe call 911 IMMEDIATELY. If it is mild, please call our office. . If you take any of these medications: Glipizide/Metformin, Avandament, Glucavance, please do not take 48 hours after completing test.

## 2017-08-28 NOTE — Progress Notes (Signed)
Cardiology Office Note:    Date:  08/28/2017   ID:  Brianna Hawkins, DOB 1960/05/16, MRN [id number]  PCP:  Brianna Hook, MD  Cardiologist:  New (evaluated by Dr. Peter Martinique in 2014)   Referring MD: Brianna Hook, MD   Chief Complaint  Patient presents with  . Chest Pain    History of Present Illness:    Brianna Hawkins is a 57 y.o. female with hypertension, diabetes, hyperlipidemia, GERD, remote smoking hx and possible FHx of CAD who is being seen today for the evaluation of chest pain at the request of Brianna Hook, MD.   [name] is here alone today.  She is concerned due to her family hx of CAD.  She has a sister that died at age 20 due to "heart disease."  She is not sure of the diagnosis.  The patient had an episode of chest pain ~1 month ago while at rest.  She had a few minutes of L sided sharp chest pain. She had no radiation but was short of breath and diaphoretic.  She has had some brief "stick pains" since then.  She has not had exertional chest pain.  She does note fatigue and dyspnea on exertion.  She gets short of breath with moderate activities. She denies paroxysmal nocturnal dyspnea.  She has noted lower extremity swelling.  She sleeps on 2-3 pillows due to acid reflux.  She has noted a weight gain of 20 lbs since last year.  She denies syncope.     PAD Screen 08/28/2017  Previous PAD dx? No  Previous surgical procedure? No  Pain with walking? Yes  Subsides with rest? Yes  Feet/toe relief with dangling? Yes  Painful, non-healing ulcers? No  Extremities discolored? No    Prior CV studies:   The following studies were reviewed today:  Event monitor 03/2012 Normal sinus rhythm  Echocardiogram 01/2010 Reportedly normal-EF 63%, mild LAE  Past Medical History:  Diagnosis Date  . Allergy   . Arthritis   . Blood transfusion    with child birth  . Blood transfusion without reported diagnosis   . DM2 (diabetes mellitus, type 2) ([place])   .  GERD (gastroesophageal reflux disease)   . Headache(784.0)    migraine HAs  . Heart murmur    very mild-echo 1/15-normal  . HLD (hyperlipidemia)   . Hypertension   . Migraine   . Pain, joint, shoulder region, right   . Plantar fasciitis of left foot 09/04/2016  . Substance abuse ([place])   . 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  Marland Kitchen Urinary urgency   . Vertigo     Past Surgical History:  Procedure Laterality Date  . BREAST BIOPSY  2012   Underarm-rt-extra tissue  . COLONOSCOPY  age 37 yo   Screen  . ETHMOIDECTOMY Left 07/10/2013   Procedure: ETHMOIDECTOMY LEFT,MAXILLARY OSTIAL ENLARGEMENT WITH REMOVAL OF DISEASE;  Surgeon: Rozetta Nunnery, MD;  Location: [facility];  Service: ENT;  Laterality: Left;  . NOSE SURGERY  2015  . TUBAL LIGATION  1980s  . TURBINATE REDUCTION Left 07/10/2013   Procedure: LEFT TURBINATE REDUCTION;  Surgeon: Rozetta Nunnery, MD;  Location: [facility];  Service: ENT;  Laterality: Left;  . UPPER GI ENDOSCOPY      Current Medications: Current Meds  Medication Sig  . AGAMATRIX ULTRA-THIN LANCETS MISC Check sugars twice daily before meals  . amLODipine (NORVASC) 10 MG tablet Take 1 tablet (10 mg total) by mouth  daily.  . aspirin EC 81 MG tablet Take 1 tablet (81 mg total) by mouth daily.  Marland Kitchen atorvastatin (LIPITOR) 20 MG tablet 1 tab by mouth with evening meal daily  . Blood Glucose Monitoring Suppl (AGAMATRIX PRESTO) w/Device KIT Check sugars twice daily before meals  . esomeprazole (NEXIUM) 40 MG capsule TAKE 1 CAPSULE (40 MG TOTAL) BY MOUTH DAILY.  . fluticasone (FLONASE) 50 MCG/ACT nasal spray Place 2 sprays into both nostrils daily.  Marland Kitchen glucose blood (AGAMATRIX PRESTO TEST) test strip Check blood glucose twice daily before meals  . hydrochlorothiazide (HYDRODIURIL) 25 MG tablet TAKE 1 TABLET BY MOUTH ONCE DAILY  . metFORMIN (GLUCOPHAGE-XR) 500 MG 24 hr tablet 1 tab by mouth twice daily with meals  . Multiple Vitamins-Minerals  (HAIR/SKIN/NAILS) TABS Take 1 tablet by mouth daily.  . nitroGLYCERIN (NITROSTAT) 0.4 MG SL tablet 1 tab under tongue for chest pressure, may repeat every 5 minutes if not relieved for total of 3 doses.  . rizatriptan (MAXALT-MLT) 10 MG disintegrating tablet Take 1 tablet (10 mg total) by mouth as needed. May repeat in 2 hours if needed     Allergies:   Banana; Watermelon [citrullus vulgaris]; and Tomato   Social History   Socioeconomic History  . Marital status: Single    Spouse name: Not on file  . Number of children: 4  . Years of education: 47  . Highest education level: Not on file  Occupational History  . 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 Financial resource strain: Not on file  . Food insecurity:    Worry: Not on file    Inability: Not on file  . Transportation needs:    Medical: Not on file    Non-medical: Not on file  Tobacco Use  . Smoking status: Former Smoker    Packs/day: 0.30    Years: 15.00    Pack years: 4.50    Types: Cigarettes    Last attempt to quit: 01/01/1998    Years since quitting: 19.6  . Smokeless tobacco: Never Used  . Tobacco comment: "never inhaled"  Substance and Sexual Activity  . Alcohol use: Yes    Alcohol/week: 1.0 standard drinks    Types: 1 Glasses of wine per week    Comment: rare  . Drug use: No    Comment: Last crack cocaine 1998  . Sexual activity: Yes    Birth control/protection: Surgical  Lifestyle  . Physical activity:    Days per week: Not on file    Minutes per session: Not on file  . Stress: Not on file  Relationships  . Social connections:    Talks on phone: Not on file    Gets together: Not on file    Attends religious service: Not on file    Active member of club or organization: Not on file    Attends meetings of clubs or organizations: Not on file    Relationship status: Not on file  Other Topics Concern  . Not on file  Social History Narrative    Single. Education: [employer].   Right-handed.   1-2 cups caffeine per day.   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     Family Hx: The patient's family history includes Alcohol abuse in her father and mother; Cancer in her mother; Cancer (age of onset: 72) in her father; Colon cancer in her father; Dementia  in her other; Diabetes in her maternal grandmother, mother, and paternal grandmother; HIV in her sister; Heart disease in her mother; Heart disease (age of onset: 62) in her sister; Stroke in her sister; Throat cancer in her father. There is no history of Sudden Cardiac Death.  ROS:   Please see the history of present illness.    Review of Systems  Constitution: Positive for diaphoresis, malaise/fatigue and weight gain.  Eyes: Positive for visual disturbance.  Cardiovascular: Positive for chest pain, dyspnea on exertion and leg swelling.  Respiratory: Positive for shortness of breath, snoring and wheezing.   Hematologic/Lymphatic: Bruises/bleeds easily.  Musculoskeletal: Positive for joint pain.  Neurological: Positive for dizziness and headaches.   All other systems reviewed and are negative.   EKGs/Labs/Other Test Reviewed:    EKG:  EKG is not ordered today.   The ekg from 07/01/17 was personally reviewed and demonstrates -normal sinus rhythm, heart rate 72, normal axis, no ST-T wave changes, QTC 444  Recent Labs: 07/02/2017: ALT 13; BUN 16; Creatinine, Ser 0.69; Hemoglobin 11.9; Platelets 277; Potassium 4.3; Sodium 146; TSH 1.150   Recent Lipid Panel Lab Results  Component Value Date/Time   CHOL 201 (H) 07/02/2017 09:44 AM   TRIG 80 07/02/2017 09:44 AM   HDL 44 07/02/2017 09:44 AM   CHOLHDL 3.5 10/02/2012 09:34 AM   LDLCALC 141 (H) 07/02/2017 09:44 AM   ASCVD (Atherosclerotic Cardiovascular Disease) Risk Algorithm including Known ASCVD from AHA/ACC from [email or web address]  on 08/28/2017 ** All calculations should be rechecked by clinician prior to  use **  RESULT SUMMARY:   High-intensity statin recommended because of known diabetes and 10-year risk ?7.5%.   If LDL <71m/dl (1.81 mmol/L), additional factors like lifestyle and risk-benefits can be considered before starting statins  To view statin dosages by intensity, see Evidence section.  14.1%  Risk of cardiovascular event (coronary or stroke death or non-fatal MI or stroke) in next 10 years.   INPUTS: History of ASCVD -> 0 = No LDL Cholesterol ?1957mdL (4.92 mmol/L) -> 0 = No Age -> 56 years Diabetes -> 1 = Yes Sex -> 0 = Female Race -> 2 = African American Total Cholesterol -> 201 mg/dL HDL Cholesterol -> 44 mg/dL Systolic Blood Pressure -> 130 mm Hg Treatment for Hypertension -> 1 = Yes Smoker -> 0 = No   Physical Exam:    VS:  BP 130/84   Pulse 81   Ht _0  (1.651 m)   Wt 245 lb 8 oz (111.4 kg)   LMP 12/11/2013   SpO2 97%   BMI 40.85 kg/m     Wt Readings from Last 3 Encounters:  08/28/17 245 lb 8 oz (111.4 kg)  08/07/17 250 lb (113.4 kg)  07/01/17 250 lb (113.4 kg)     Physical Exam  Constitutional: She is oriented to person, place, and time. She appears well-developed and well-nourished. No distress.  HENT:  Head: Normocephalic and atraumatic.  Eyes: No scleral icterus.  Neck: No JVD present. No thyromegaly present.  Cardiovascular: Normal rate and regular rhythm.  No murmur heard. DP/PT difficult to palpate bilaterally  Pulmonary/Chest: Effort normal.  Abdominal: Soft. She exhibits no distension. There is no hepatomegaly.  Musculoskeletal: She exhibits no edema.  Lymphadenopathy:    She has no cervical adenopathy.  Neurological: She is alert and oriented to person, place, and time.  Skin: Skin is warm and dry.  Psychiatric: She has a normal mood and affect.    ASSESSMENT & PLAN:  Other chest pain She presents with complaints of chest discomfort that is fairly atypical for ischemia.  Recent ECG does not demonstrate any ischemic  changes.  However, she has multiple cardiac risk factors including possible family history, diabetes, hypertension, hyperlipidemia and prior smoking.  She also has a history of prior drug abuse.  10-year ASCVD risk is 14.1%.  She does have symptoms of fatigue as well as dyspnea on exertion.  She feels that these are new for her over the past 6 to 12 months.  We discussed options for further testing to evaluate her symptoms including exercise tolerance test versus stress echocardiogram versus nuclear stress test versus coronary CTA.  Given her significant risk factors, I have recommended proceeding with coronary CTA to assess for obstructive coronary disease.  Given her shortness of breath, I have also recommended proceeding with an echocardiogram to assess her LV function and diastolic dysfunction.  I discussed her case today with Dr. Caryl Comes (attending MD) who agreed.  -Arrange coronary CTA +/- FFR  -Arrange echocardiogram  Essential hypertension  The patient's blood pressure is controlled on her current regimen.  Continue current therapy.  She does note a history of snoring.  I have asked her to discuss with her family.  If she has witnessed apneic episodes, we will need to consider proceeding with sleep testing.  Type 2 diabetes mellitus without complication, without long-term current use of insulin ([place]) Continue follow-up with primary care.  Recent A1c 6.5.  Hyperlipidemia, unspecified hyperlipidemia type As noted, 10-year ASCVD risk 14.1%.  Hyperlipidemia is managed by primary care.  Consider adjusting atorvastatin to high dose therapy.  Pain in both lower extremities She has symptoms that are somewhat suggestive of claudication.  At follow-up, we will discuss whether or not to proceed with ABIs.  Shortness of breath  -Obtain BMET, BNP today  -Change HCTZ to Lasix if BNP significantly elevated  -Obtain echocardiogram   Dispo:  Return in about 4 weeks (around 09/25/2017) for Follow up after  testing, w/ Richardson Dopp, PA-C.   Medication Adjustments/Labs and Tests Ordered: Current medicines are reviewed at length with the patient today.  Concerns regarding medicines are outlined above.  Orders/Tests:  Orders Placed This Encounter  Procedures  . CT CORONARY MORPH W/CTA COR W/SCORE W/CA W/CM &/OR WO/CM  . CT CORONARY FRACTIONAL FLOW RESERVE DATA PREP  . CT CORONARY FRACTIONAL FLOW RESERVE FLUID ANALYSIS  . Basic metabolic panel  . Pro b natriuretic peptide (BNP)  . ECHOCARDIOGRAM COMPLETE   Medication changes: Meds ordered this encounter  Medications  . metoprolol tartrate (LOPRESSOR) 50 MG tablet    Sig: Take 1 tablet (50 mg total) by mouth once for 1 dose. THIS IS TO BE TAKEN 1 HOUR BEFORE CORONARY CTA    Dispense:  1 tablet    Refill:  0   Signed, Richardson Dopp, PA-C  08/28/2017 10:41 AM    [facility] [place], [place], [place]  [address] Phone: [phone]; Fax: [phone]

## 2017-08-30 NOTE — Telephone Encounter (Signed)
Left message x 2 to go over lab results.  

## 2017-08-30 NOTE — Telephone Encounter (Signed)
-----   Message from Liliane Shi, [place] sent at 08/28/2017  5:28 PM EDT ----- All values are normal or within acceptable limits.   Medication changes / Follow up labs / Other changes or recommendations:    - Continue current medications and follow up as planned.  Richardson Dopp, PA-C 08/28/2017 5:28 PM

## 2017-09-03 NOTE — Telephone Encounter (Signed)
-----   Message from Liliane Shi, [place] sent at 08/28/2017  5:28 PM EDT ----- All values are normal or within acceptable limits.   Medication changes / Follow up labs / Other changes or recommendations:    - Continue current medications and follow up as planned.  Richardson Dopp, PA-C 08/28/2017 5:28 PM

## 2017-09-03 NOTE — Telephone Encounter (Signed)
Results have been sent to [place]

## 2017-09-10 ENCOUNTER — Ambulatory Visit (HOSPITAL_COMMUNITY): Payer: Self-pay | Attending: Cardiology

## 2017-09-10 ENCOUNTER — Other Ambulatory Visit: Payer: Self-pay

## 2017-09-10 ENCOUNTER — Encounter: Payer: Self-pay | Admitting: Physician Assistant

## 2017-09-10 DIAGNOSIS — R0602 Shortness of breath: Secondary | ICD-10-CM | POA: Insufficient documentation

## 2017-09-10 DIAGNOSIS — I10 Essential (primary) hypertension: Secondary | ICD-10-CM | POA: Insufficient documentation

## 2017-09-10 DIAGNOSIS — E119 Type 2 diabetes mellitus without complications: Secondary | ICD-10-CM | POA: Insufficient documentation

## 2017-09-10 DIAGNOSIS — R011 Cardiac murmur, unspecified: Secondary | ICD-10-CM | POA: Insufficient documentation

## 2017-09-10 DIAGNOSIS — E785 Hyperlipidemia, unspecified: Secondary | ICD-10-CM | POA: Insufficient documentation

## 2017-09-11 ENCOUNTER — Telehealth: Payer: Self-pay | Admitting: *Deleted

## 2017-09-11 NOTE — Telephone Encounter (Signed)
Pt has been notified of echo results by phone with verbal understanding. Pt thanked me for the call. I will forward a copy of results to PCP Dr. Mack Hook.

## 2017-09-11 NOTE — Telephone Encounter (Signed)
-----   Message from Liliane Shi, [place] sent at 09/10/2017  5:23 PM EDT ----- This study demonstrates:  Normal heart function (ejection fraction) and no significant valvular abnormalities. Medication changes / Follow up studies / Other recommendations:    - Continue current medications and follow up as planned.  Please send results to the PCP:  Mack Hook, MD  Richardson Dopp, PA-C 09/10/2017 5:22 PM

## 2017-09-20 ENCOUNTER — Ambulatory Visit (HOSPITAL_COMMUNITY): Payer: Self-pay

## 2017-09-20 ENCOUNTER — Ambulatory Visit (HOSPITAL_COMMUNITY)
Admission: RE | Admit: 2017-09-20 | Discharge: 2017-09-20 | Disposition: A | Discharge status: Home / Self Care | Payer: Self-pay | Source: Ambulatory Visit | Attending: Physician Assistant | Admitting: Physician Assistant

## 2017-09-20 DIAGNOSIS — R0602 Shortness of breath: Secondary | ICD-10-CM | POA: Insufficient documentation

## 2017-09-20 DIAGNOSIS — R0789 Other chest pain: Secondary | ICD-10-CM | POA: Insufficient documentation

## 2017-09-20 DIAGNOSIS — R079 Chest pain, unspecified: Secondary | ICD-10-CM

## 2017-09-20 MED ORDER — IOPAMIDOL (ISOVUE-370) INJECTION 76%
100.0000 mL | Freq: Once | INTRAVENOUS | Status: AC | PRN
  Administered 2017-09-20: 80 mL via INTRAVENOUS

## 2017-09-20 MED ORDER — NITROGLYCERIN 0.4 MG SL SUBL
SUBLINGUAL_TABLET | SUBLINGUAL | Status: AC
  Administered 2017-09-20: 0.8 mg
  Filled 2017-09-20: qty 2

## 2017-09-23 ENCOUNTER — Encounter: Payer: Self-pay | Admitting: Physician Assistant

## 2017-09-24 ENCOUNTER — Encounter: Payer: Self-pay | Admitting: Physician Assistant

## 2017-09-24 ENCOUNTER — Ambulatory Visit (INDEPENDENT_AMBULATORY_CARE_PROVIDER_SITE_OTHER): Payer: Self-pay | Admitting: Physician Assistant

## 2017-09-24 VITALS — BP 110/72 | HR 88 | Ht 65.0 in | Wt 246.8 lb

## 2017-09-24 DIAGNOSIS — I10 Essential (primary) hypertension: Secondary | ICD-10-CM

## 2017-09-24 DIAGNOSIS — E785 Hyperlipidemia, unspecified: Secondary | ICD-10-CM

## 2017-09-24 DIAGNOSIS — I2510 Atherosclerotic heart disease of native coronary artery without angina pectoris: Secondary | ICD-10-CM

## 2017-09-24 MED ORDER — ATORVASTATIN CALCIUM 40 MG PO TABS: 40.0000 mg | ORAL_TABLET | Freq: Every day | ORAL | 3 refills | Status: DC

## 2017-09-24 NOTE — Patient Instructions (Signed)
Medication Instructions:  1. INCREASE LIPITOR TO 40 MG DAILY; NEW RX HAS BEEN SENT IN   Labwork: FASTING LIPID AND LIVER PANEL TO BE DONE 11/08/17; NOTHING TO EAT AFTER MIDNIGHT THE NIGHT BEFORE LAB WORK. MORNING MEDICATIONS WITH WATER IS FINE . LAB OPENS 7:30 AM   Testing/Procedures: NONE ORDERED TODAY  Follow-Up: [employer], PAC IN 1 YEAR   Any Other Special Instructions Will Be Listed Below (If Applicable).     If you need a refill on your cardiac medications before your next appointment, please call your pharmacy.

## 2017-09-24 NOTE — Progress Notes (Signed)
Cardiology Office Note:    Date:  09/24/2017   ID:  Brianna Hawkins, DOB [birth year], MRN [id number]  PCP:  Mack Hook, MD  Cardiologist:  Mertie Moores, MD / Richardson Dopp, PA-C  Electrophysiologist:  None   Referring MD: Mack Hook, MD   Chief Complaint  Patient presents with  . Follow-up    review recent tests     History of Present Illness:    Brianna Hawkins is a 57 y.o. female with hypertension, diabetes, hyperlipidemia, GERD, remote smoking hx and possible FHx of CAD.  She was evaluated for chest pain and shortness of breath last month.  A BNP was normal.  An echocardiogram demonstrated normal LV function and normal diastolic function.  Coronary CTA demonstrated no obstructive CAD.  She did have a calcium score of 1 placing her in the 80th percentile for her age and gender.  This suggest high risk for future cardiac events.  Brianna Hawkins returns for follow-up.  She is here alone.  She denies chest pain.  She continues to note shortness of breath with activities.  This is unchanged.  She denies syncope, PND or lower extremity swelling.  We reviewed the findings of her echocardiogram and coronary CTA in detail today.  Prior CV studies:   The following studies were reviewed today:  Coronary CTA 09/20/17 IMPRESSION: 1. Coronary artery calcium score 1 Agatston unit, placing the patient in the 80th percentile for age and gender. Given relatively young age, this suggests high risk for future cardiac events. 2.  No obstructive coronary disease.  Echo 09/10/17 EF 55-60, no RWMA, normal diastolic function, trace MR/TR  Event monitor 03/2012 Normal sinus rhythm  Echocardiogram 01/2010 Reportedly normal-EF 63%, mild LAE  Past Medical History:  Diagnosis Date  . Allergy   . Arthritis   . Blood transfusion    with child birth  . Blood transfusion without reported diagnosis   . Coronary artery calcification seen on CT scan    Coronary CTA 9/19: Ca score = 1  (80th percentile); calcified plaque distal LM; no obstructive CAD.  Marland Kitchen DM2 (diabetes mellitus, type 2) ([place])   . GERD (gastroesophageal reflux disease)   . Headache(784.0)    migraine HAs  . Heart murmur    very mild-echo 1/15-normal  . History of echocardiogram    Echo 9/19: EF 55-60, no RWMA, normal diastolic function, trace MR/TR  . HLD (hyperlipidemia)   . Hypertension   . Migraine   . Pain, joint, shoulder region, right   . Plantar fasciitis of left foot 09/04/2016  . Substance abuse ([place])   . 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  Marland Kitchen Urinary urgency   . Vertigo    Surgical Hx: The patient  has a past surgical history that includes Colonoscopy (age 38 yo); Upper gi endoscopy; Ethmoidectomy (Left, 07/10/2013); Turbinate reduction (Left, 07/10/2013); Nose surgery (2015); Tubal ligation (1980s); and Breast biopsy (2012).   Current Medications: Current Meds  Medication Sig  . AGAMATRIX ULTRA-THIN LANCETS MISC Check sugars twice daily before meals  . amLODipine (NORVASC) 10 MG tablet Take 1 tablet (10 mg total) by mouth daily.  Marland Kitchen aspirin EC 81 MG tablet Take 1 tablet (81 mg total) by mouth daily.  . Blood Glucose Monitoring Suppl (AGAMATRIX PRESTO) w/Device KIT Check sugars twice daily before meals  . esomeprazole (NEXIUM) 40 MG capsule TAKE 1 CAPSULE (40 MG TOTAL) BY MOUTH DAILY.  . fluticasone (FLONASE) 50 MCG/ACT nasal spray Place 2 sprays into both nostrils daily.  Marland Kitchen  glucose blood (AGAMATRIX PRESTO TEST) test strip Check blood glucose twice daily before meals  . hydrochlorothiazide (HYDRODIURIL) 25 MG tablet TAKE 1 TABLET BY MOUTH ONCE DAILY  . metFORMIN (GLUCOPHAGE-XR) 500 MG 24 hr tablet 1 tab by mouth twice daily with meals  . metoprolol tartrate (LOPRESSOR) 50 MG tablet Take 1 tablet (50 mg total) by mouth once for 1 dose. THIS IS TO BE TAKEN 1 HOUR BEFORE CORONARY CTA  . Multiple Vitamins-Minerals (HAIR/SKIN/NAILS) TABS Take 1 tablet by mouth daily.  . nitroGLYCERIN (NITROSTAT) 0.4  MG SL tablet 1 tab under tongue for chest pressure, may repeat every 5 minutes if not relieved for total of 3 doses.  . rizatriptan (MAXALT-MLT) 10 MG disintegrating tablet Take 1 tablet (10 mg total) by mouth as needed. May repeat in 2 hours if needed  . [DISCONTINUED] atorvastatin (LIPITOR) 20 MG tablet 1 tab by mouth with evening meal daily     Allergies:   Banana; Watermelon [citrullus vulgaris]; and Tomato   Social History   Tobacco Use  . Smoking status: Former Smoker    Packs/day: 0.30    Years: 15.00    Pack years: 4.50    Types: Cigarettes    Last attempt to quit: 01/01/1998    Years since quitting: 19.7  . Smokeless tobacco: Never Used  . Tobacco comment: "never inhaled"  Substance Use Topics  . Alcohol use: Yes    Alcohol/week: 1.0 standard drinks    Types: 1 Glasses of wine per week    Comment: rare  . Drug use: No    Comment: Last crack cocaine 1998     Family Hx: The patient's family history includes Alcohol abuse in her father and mother; Cancer in her mother; Cancer (age of onset: 87) in her father; Colon cancer in her father; Dementia in her other; Diabetes in her maternal grandmother, mother, and paternal grandmother; HIV in her sister; Heart disease in her mother; Heart disease (age of onset: 1) in her sister; Stroke in her sister; Throat cancer in her father. There is no history of Sudden Cardiac Death.  ROS:   Please see the history of present illness.    Review of Systems  Constitution: Positive for chills, diaphoresis and malaise/fatigue.  Eyes: Positive for visual disturbance.  Cardiovascular: Positive for dyspnea on exertion.  Respiratory: Positive for shortness of breath.   Hematologic/Lymphatic: Bruises/bleeds easily.  Musculoskeletal: Positive for myalgias.  Gastrointestinal: Positive for constipation.  Neurological: Positive for dizziness and headaches.  Psychiatric/Behavioral: Positive for depression. The patient is nervous/anxious.    All other  systems reviewed and are negative.   EKGs/Labs/Other Test Reviewed:    EKG:  EKG is not ordered today.    Recent Labs: 07/02/2017: ALT 13; Hemoglobin 11.9; Platelets 277; TSH 1.150 08/28/2017: BUN 16; Creatinine, Ser 0.72; NT-Pro BNP 32; Potassium 4.0; Sodium 145   Recent Lipid Panel Lab Results  Component Value Date/Time   CHOL 201 (H) 07/02/2017 09:44 AM   TRIG 80 07/02/2017 09:44 AM   HDL 44 07/02/2017 09:44 AM   CHOLHDL 3.5 10/02/2012 09:34 AM   LDLCALC 141 (H) 07/02/2017 09:44 AM    Physical Exam:    VS:  BP 110/72   Pulse 88   Ht _0  (1.651 m)   Wt 246 lb 12.8 oz (111.9 kg)   LMP 12/11/2013   SpO2 96%   BMI 41.07 kg/m     Wt Readings from Last 3 Encounters:  09/24/17 246 lb 12.8 oz (111.9 kg)  08/28/17 245 lb 8 oz (111.4 kg)  08/07/17 250 lb (113.4 kg)     Physical Exam  Constitutional: She is oriented to person, place, and time. She appears well-developed and well-nourished. No distress.  HENT:  Head: Normocephalic and atraumatic.  Neck: No thyromegaly present.  Cardiovascular: Normal rate and regular rhythm.  No murmur heard. Pulmonary/Chest: Effort normal. She has no rales.  Abdominal: Soft.  Musculoskeletal: She exhibits no edema.  Neurological: She is alert and oriented to person, place, and time.  Skin: Skin is warm and dry.    ASSESSMENT & PLAN:    Coronary artery calcification seen on CT scan Recent coronary CTA demonstrated nonobstructive CAD.  She does have calcium score of 1 which places her in the 80th percentile for age and gender.  We discussed the importance of aggressive risk factor modification.  Recent hemoglobin A1c was 6.5.  Her blood pressure is well controlled.  She does not smoke.  She would like to start an exercise program.  I have recommended that we increase her statin to moderate intensity.  She can continue an aspirin a day.  Follow-up here in 1 year, or sooner if needed.  -Continue aspirin 81 mg daily  -Increase atorvastatin  to 40 mg daily  -She may start an exercise program (we discussed gradually increasing activity)  -Follow up in Cardiology in 1 year.  I will have her see either me or Dr. Acie Fredrickson.  Essential hypertension The patient's blood pressure is controlled on her current regimen.  Continue current therapy.   Hyperlipidemia, unspecified hyperlipidemia type Increase atorvastatin to 40 mg daily.  Obtain lipids and LFTs in 8 to 12 weeks.   Dispo:  Return in about 1 year (around 09/25/2018) for Routine Follow Up, w/ Richardson Dopp, PA-C.   Medication Adjustments/Labs and Tests Ordered: Current medicines are reviewed at length with the patient today.  Concerns regarding medicines are outlined above.  Tests Ordered: Orders Placed This Encounter  Procedures  . Lipid Profile  . Hepatic function panel   Medication Changes: Meds ordered this encounter  Medications  . atorvastatin (LIPITOR) 40 MG tablet    Sig: Take 1 tablet (40 mg total) by mouth daily.    Dispense:  90 tablet    Refill:  3    DOSE INCREASE    Signed, Richardson Dopp, PA-C  09/24/2017 12:13 PM    [facility] [address], [place], [place]  [address] Phone: [phone]; Fax: [phone]

## 2017-10-18 MED FILL — ESOMEPRAZOLE MAG DR 40 MG C: 40 | 30 days supply | Qty: 30 | Fill #2

## 2017-11-07 ENCOUNTER — Ambulatory Visit: Payer: Medicaid Other | Admitting: Internal Medicine

## 2017-11-07 ENCOUNTER — Encounter: Payer: Self-pay | Admitting: Internal Medicine

## 2017-11-07 VITALS — BP 102/80 | HR 80 | Resp 12 | Ht 65.0 in | Wt 240.0 lb

## 2017-11-07 DIAGNOSIS — E7800 Pure hypercholesterolemia, unspecified: Secondary | ICD-10-CM | POA: Insufficient documentation

## 2017-11-07 DIAGNOSIS — Z79899 Other long term (current) drug therapy: Secondary | ICD-10-CM

## 2017-11-07 DIAGNOSIS — E785 Hyperlipidemia, unspecified: Secondary | ICD-10-CM | POA: Insufficient documentation

## 2017-11-07 DIAGNOSIS — Z23 Encounter for immunization: Secondary | ICD-10-CM | POA: Diagnosis not present

## 2017-11-07 DIAGNOSIS — E119 Type 2 diabetes mellitus without complications: Secondary | ICD-10-CM

## 2017-11-07 DIAGNOSIS — E1169 Type 2 diabetes mellitus with other specified complication: Secondary | ICD-10-CM | POA: Insufficient documentation

## 2017-11-07 DIAGNOSIS — I10 Essential (primary) hypertension: Secondary | ICD-10-CM

## 2017-11-07 LAB — GLUCOSE, POCT (MANUAL RESULT ENTRY): POC GLUCOSE: 119 mg/dL — AB (ref 70–99)

## 2017-11-07 MED ORDER — AMLODIPINE BESYLATE 10 MG PO TABS: 10.0000 mg | ORAL_TABLET | Freq: Every day | ORAL | 3 refills | Status: DC

## 2017-11-07 NOTE — Progress Notes (Signed)
Subjective:    Patient ID: Brianna Hawkins, female    DOB: [birth year], 57 y.o.   MRN: [id number]  HPI   1.  Chest pain:  Underwent evaluation by Richardson Dopp, PA-C end of September with normal echo, BNP.  Coronary CTA with no obstructive CAD, but calcium score of 1 placed her in 80th percentile for age and gender or high risk for future cardiac event. Increased Atorvastatin to 40 mg daily and has follow up with cardiology for fasting labs tomorrow. She did have another episode of chest discomfort on 10/28/17.  Took NTG sublingual and took about 3 minutes for CP to resolve.  She was at rest when occurred. She is continuing on low dose ASA daily She did get a treadmill and is intermittently walking.  When she uses, walks about 2 miles.    2.  DM:  Sugars running below 125 generally before meals.  This is any time of day.  Has not had influenza or pneumococcal vaccines yet.  3.  Hypertension:  Controlled  4.  Sharp pain in lower legs bilaterally.  Feels like it is in her muscles.  Does not matter if walking or at rest.  Just there seconds and gone.  Started 3 weeks.  She started her walking about 1.5 months ago.  Feels she is staying hydrated.  Does not stretch out before or after.    Current Meds  Medication Sig  . AGAMATRIX ULTRA-THIN LANCETS MISC Check sugars twice daily before meals  . amLODipine (NORVASC) 10 MG tablet Take 1 tablet (10 mg total) by mouth daily.  Marland Kitchen aspirin EC 81 MG tablet Take 1 tablet (81 mg total) by mouth daily.  Marland Kitchen atorvastatin (LIPITOR) 40 MG tablet Take 1 tablet (40 mg total) by mouth daily.  . Blood Glucose Monitoring Suppl (AGAMATRIX PRESTO) w/Device KIT Check sugars twice daily before meals  . Cholecalciferol (VITAMIN D3) 50 MCG (2000 UT) TABS Take by mouth daily.  Marland Kitchen esomeprazole (NEXIUM) 40 MG capsule TAKE 1 CAPSULE (40 MG TOTAL) BY MOUTH DAILY.  . fluticasone (FLONASE) 50 MCG/ACT nasal spray Place 2 sprays into both nostrils daily.  Marland Kitchen glucose blood  (AGAMATRIX PRESTO TEST) test strip Check blood glucose twice daily before meals  . hydrochlorothiazide (HYDRODIURIL) 25 MG tablet TAKE 1 TABLET BY MOUTH ONCE DAILY  . metFORMIN (GLUCOPHAGE-XR) 500 MG 24 hr tablet 1 tab by mouth twice daily with meals  . Multiple Vitamins-Minerals (HAIR/SKIN/NAILS) TABS Take 1 tablet by mouth daily.  . nitroGLYCERIN (NITROSTAT) 0.4 MG SL tablet 1 tab under tongue for chest pressure, may repeat every 5 minutes if not relieved for total of 3 doses.  . rizatriptan (MAXALT-MLT) 10 MG disintegrating tablet Take 1 tablet (10 mg total) by mouth as needed. May repeat in 2 hours if needed  . [DISCONTINUED] amLODipine (NORVASC) 10 MG tablet Take 1 tablet (10 mg total) by mouth daily.   Allergies  Allergen Reactions  . Banana Itching    Of throat  . Watermelon [Citrullus Vulgaris]   . Tomato Rash    Review of Systems     Objective:   Physical Exam  Lungs:  CTA CV:  RRR without murmur or rub.  Radial and DP pulses normal and equal LE:  No edema.  NT on palpation of musculature.      Assessment & Plan:  1.  CP:  No obstructive coronary artery lesions, but at high risk for future cardiac events.  Working on improving risk factors.  2.  Obesity:  Small goals regarding diet and physical activity discussed.  Encouraged using a calendar and write her daily or weekly goals down.  Encouraged making daily effort at lower level for physical activity and gradually increase--again to write down her goals and get started.  3.  DM:  Sounds likely with improved control.  6.5% A1C in July.  A1C today.  Tolerating Metformin ER Flu vaccine at [facility] given  4.  Hypertension;  Controlled.  5.  Hypercholesterolemia:  Fasting today and need to draw blood for other labs, so will check FLP and patient will call to cancel labs with cardiology.  CMP today as well.  6.  HM:  Tdap today

## 2017-11-08 ENCOUNTER — Other Ambulatory Visit: Payer: Self-pay

## 2017-11-08 LAB — COMPREHENSIVE METABOLIC PANEL
A/G RATIO: 1.6 (ref 1.2–2.2)
ALBUMIN: 3.8 g/dL (ref 3.5–5.5)
ALT: 10 IU/L (ref 0–32)
AST: 16 IU/L (ref 0–40)
Alkaline Phosphatase: 85 IU/L (ref 39–117)
BUN / CREAT RATIO: 27 — AB (ref 9–23)
BUN: 17 mg/dL (ref 6–24)
Bilirubin Total: 0.5 mg/dL (ref 0.0–1.2)
CALCIUM: 9.2 mg/dL (ref 8.7–10.2)
CO2: 25 mmol/L (ref 20–29)
Chloride: 103 mmol/L (ref 96–106)
Creatinine, Ser: 0.62 mg/dL (ref 0.57–1.00)
GFR calc Af Amer: 118 mL/min/{1.73_m2} (ref >59)
GFR, EST NON AFRICAN AMERICAN: 103 mL/min/{1.73_m2} (ref >59)
GLOBULIN, TOTAL: 2.4 g/dL (ref 1.5–4.5)
Glucose: 91 mg/dL (ref 65–99)
Potassium: 3.9 mmol/L (ref 3.5–5.2)
Sodium: 142 mmol/L (ref 134–144)
Total Protein: 6.2 g/dL (ref 6.0–8.5)

## 2017-11-08 LAB — LIPID PANEL W/O CHOL/HDL RATIO
CHOLESTEROL TOTAL: 141 mg/dL (ref 100–199)
HDL: 39 mg/dL — AB (ref >39)
LDL CALC: 83 mg/dL (ref 0–99)
TRIGLYCERIDES: 97 mg/dL (ref 0–149)
VLDL CHOLESTEROL CAL: 19 mg/dL (ref 5–40)

## 2017-11-08 LAB — HGB A1C W/O EAG: Hgb A1c MFr Bld: 6.1 % — ABNORMAL HIGH (ref 4.8–5.6)

## 2017-11-18 MED FILL — ESOMEPRAZOLE MAG DR 40 MG C: 40 | 30 days supply | Qty: 30 | Fill #3

## 2017-12-16 ENCOUNTER — Other Ambulatory Visit: Payer: Self-pay | Admitting: Internal Medicine

## 2018-01-21 ENCOUNTER — Other Ambulatory Visit: Payer: Self-pay | Admitting: Internal Medicine

## 2018-01-23 MED FILL — ESOMEPRAZOLE MAG DR 40 MG C: 40 | 30 days supply | Qty: 30 | Fill #4

## 2018-03-10 ENCOUNTER — Encounter: Payer: Medicaid Other | Admitting: Internal Medicine

## 2018-06-16 ENCOUNTER — Other Ambulatory Visit: Payer: Self-pay | Admitting: Internal Medicine

## 2018-06-16 DIAGNOSIS — Z1231 Encounter for screening mammogram for malignant neoplasm of breast: Secondary | ICD-10-CM

## 2018-07-10 ENCOUNTER — Other Ambulatory Visit: Payer: Self-pay | Admitting: Family Medicine

## 2018-07-10 ENCOUNTER — Other Ambulatory Visit: Payer: Self-pay

## 2018-07-10 ENCOUNTER — Ambulatory Visit
Admission: RE | Admit: 2018-07-10 | Discharge: 2018-07-10 | Disposition: A | Discharge status: Home / Self Care | Payer: Medicaid Other | Source: Ambulatory Visit | Attending: Internal Medicine | Admitting: Internal Medicine

## 2018-07-10 DIAGNOSIS — Z1231 Encounter for screening mammogram for malignant neoplasm of breast: Secondary | ICD-10-CM

## 2018-07-11 ENCOUNTER — Other Ambulatory Visit: Payer: Self-pay | Admitting: Family Medicine

## 2018-07-11 DIAGNOSIS — N63 Unspecified lump in unspecified breast: Secondary | ICD-10-CM

## 2018-07-18 ENCOUNTER — Ambulatory Visit
Admission: RE | Admit: 2018-07-18 | Discharge: 2018-07-18 | Disposition: A | Discharge status: Home / Self Care | Payer: Medicaid Other | Source: Ambulatory Visit | Attending: Family Medicine | Admitting: Family Medicine

## 2018-07-18 ENCOUNTER — Other Ambulatory Visit: Payer: Self-pay

## 2018-07-18 DIAGNOSIS — N63 Unspecified lump in unspecified breast: Secondary | ICD-10-CM

## 2018-07-21 ENCOUNTER — Encounter: Payer: Self-pay | Admitting: Family Medicine

## 2018-07-21 ENCOUNTER — Other Ambulatory Visit: Payer: Self-pay

## 2018-07-21 ENCOUNTER — Ambulatory Visit (INDEPENDENT_AMBULATORY_CARE_PROVIDER_SITE_OTHER): Payer: Medicaid Other | Admitting: Family Medicine

## 2018-07-21 DIAGNOSIS — Z0000 Encounter for general adult medical examination without abnormal findings: Secondary | ICD-10-CM | POA: Diagnosis not present

## 2018-07-21 DIAGNOSIS — E119 Type 2 diabetes mellitus without complications: Secondary | ICD-10-CM

## 2018-07-21 DIAGNOSIS — R0609 Other forms of dyspnea: Secondary | ICD-10-CM

## 2018-07-21 LAB — POCT GLYCOSYLATED HEMOGLOBIN (HGB A1C): HbA1c, POC (controlled diabetic range): 6.4 % (ref 0.0–7.0)

## 2018-07-21 MED ORDER — ALBUTEROL SULFATE HFA 108 (90 BASE) MCG/ACT IN AERS: 2.0000 | INHALATION_SPRAY | Freq: Four times a day (QID) | RESPIRATORY_TRACT | 0 refills | Status: DC | PRN

## 2018-07-21 NOTE — Assessment & Plan Note (Addendum)
Well-controlled.  A1c 6.4.  On metformin 500 mg. On lipitor 40mg .  BP meds do not include ACE/ARB, therefore can consider starting this at follow up. Plan to repeat BMP, CBC, Lipid Panel, A1c in November.

## 2018-07-21 NOTE — Assessment & Plan Note (Addendum)
New patient packet reviewed with patient.  Reviewed medical history, surgical history, family history, social history, medications with patient.

## 2018-07-21 NOTE — Progress Notes (Signed)
Subjective: Chief Complaint  Patient presents with  . New Patient (Initial Visit)     HPI: Brianna Hawkins is a 58 y.o. presenting to clinic today to discuss the following:  1 Establish Care Patient presents to establish care.  Notes that she has a history of T2DM, HTN, HLD, GERD.  She is post-menopausal, last LMP 10 yrs ago.  She notes a history of chronic DOE that she has used an albuterol inhaler for previously and she is requesting a refill for this.  She denies chest pain, SOB at rest, fevers, chills, cough.  Health Maintenance: Needs Pap, Eye Exam     ROS noted in HPI.   Past Medical, Surgical, Social, and Family History Reviewed & Updated per EMR.   Pertinent Historical Findings include:   Social History   Tobacco Use  Smoking Status Former Smoker  . Packs/day: 0.30  . Years: 15.00  . Pack years: 4.50  . Types: Cigarettes  . Quit date: 01/01/1998  . Years since quitting: 20.5  Smokeless Tobacco Never Used  Tobacco Comment   "never inhaled"      Objective: LMP 12/11/2013  Vitals and nursing notes reviewed  Physical Exam:  General: 58 y.o. female in NAD HEENT: NCAT Neck: supple, no thyromegaly, no Cervical LAD Cardio: RRR no m/r/g Lungs: CTAB, no wheezing, no rhonchi, no crackles, no IWOB on RA Abdomen: Soft, non-tender to palpation, non-distended, positive bowel sounds Skin: warm and dry Extremities: Trace BLE edema   Results for orders placed or performed in visit on 07/21/18 (from the past 72 hour(s))  HgB A1c     Status: None   Collection Time: 07/21/18  3:43 PM  Result Value Ref Range   Hemoglobin A1C     HbA1c POC (<> result, manual entry)     HbA1c, POC (prediabetic range)     HbA1c, POC (controlled diabetic range) 6.4 0.0 - 7.0 %    Assessment/Plan:  Encounter for medical examination to establish care New patient packet reviewed with patient.  Reviewed medical history, surgical history, family history, social history, medications  with patient.    DM2 (diabetes mellitus, type 2) ([place]) Well-controlled.  A1c 6.4.  On metformin 500 mg. On lipitor 40mg .  BP meds do not include ACE/ARB, therefore can consider starting this at follow up. Plan to repeat BMP, CBC, Lipid Panel, A1c in November.  Dyspnea Given chronic in nature, no emergency at present.  Advised patient to follow up at her convenience to discuss this.  Appropriate return precautions given, patient voiced understanding.     PATIENT EDUCATION PROVIDED: See AVS    Diagnosis and plan along with any newly prescribed medication(s) were discussed in detail with this patient today. The patient verbalized understanding and agreed with the plan. Patient advised if symptoms worsen return to clinic or ER.   Health Maintainance: Reviewed with patient that she should have eye exam and fax records to our office.  Plan for urine microalbumin and foot exam at visit in November. Advised to make appointment for Pap smear.   Orders Placed This Encounter  Procedures  . HgB A1c    Meds ordered this encounter  Medications  . albuterol (VENTOLIN HFA) 108 (90 Base) MCG/ACT inhaler    Sig: Inhale 2 puffs into the lungs every 6 (six) hours as needed for wheezing or shortness of breath.    Dispense:  8 g    Refill:  [place], DO 07/21/2018, 6:33  PM PGY-2 [place]

## 2018-07-21 NOTE — Patient Instructions (Signed)
Thank you for coming to see me today. It was great to meet you!  Make an appointment to come back for a Pap.  Let's plan to see you again in November for follow up labs and to check on your cholesterol, blood pressure and diabetes.   Don't forget to go to your eye doctor and have them fax me the records.  If you have any questions or concerns, please do not hesitate to call the office at [phone].  Best,   Arizona Constable, DO

## 2018-07-21 NOTE — Assessment & Plan Note (Addendum)
Given chronic in nature, no emergency at present.  Advised patient to follow up at her convenience to discuss this.  Appropriate return precautions given, patient voiced understanding.

## 2018-07-27 ENCOUNTER — Other Ambulatory Visit: Payer: Self-pay | Admitting: Internal Medicine

## 2018-08-01 ENCOUNTER — Other Ambulatory Visit: Payer: Self-pay | Admitting: *Deleted

## 2018-08-01 MED ORDER — METFORMIN HCL ER 500 MG PO TB24: ORAL_TABLET | ORAL | 11 refills | Status: DC

## 2018-08-11 ENCOUNTER — Other Ambulatory Visit (HOSPITAL_COMMUNITY)
Admission: RE | Admit: 2018-08-11 | Discharge: 2018-08-11 | Disposition: A | Discharge status: Home / Self Care | Payer: Medicaid Other | Source: Ambulatory Visit | Attending: Family Medicine | Admitting: Family Medicine

## 2018-08-11 ENCOUNTER — Ambulatory Visit: Payer: Medicaid Other | Admitting: Family Medicine

## 2018-08-11 ENCOUNTER — Encounter: Payer: Self-pay | Admitting: Family Medicine

## 2018-08-11 ENCOUNTER — Other Ambulatory Visit: Payer: Self-pay

## 2018-08-11 VITALS — BP 110/70 | HR 77 | Wt 264.0 lb

## 2018-08-11 DIAGNOSIS — Z124 Encounter for screening for malignant neoplasm of cervix: Secondary | ICD-10-CM

## 2018-08-11 DIAGNOSIS — I872 Venous insufficiency (chronic) (peripheral): Secondary | ICD-10-CM

## 2018-08-11 NOTE — Assessment & Plan Note (Signed)
No wet prep needed.  No STD testing per patient.  Pap performed today.  Will inform of results.

## 2018-08-11 NOTE — Progress Notes (Signed)
Subjective: Chief Complaint  Patient presents with  . Gynecologic Exam     HPI: Brianna Hawkins is a 58 y.o. presenting to clinic today to discuss the following:  1 Pap Declines STD testing.  LMP 10 years ago.  Does not believe she has had a previous abnormal Pap.  No discharge concerns.  No vaginal complaints or urinary complaints.  2 Leg swelling and tenderness Patient presents requesting referral to vascular vein specialist in [place].  She states that she was seen by a vascular doctor for a free varicose vein consultation today and was told "we see blood coming to your foot but not going back up, you should see a vascular doctor."  She called vascular vein specialist in [place], but was advised that she needs a referral from her PCP.  She states that she has swelling in her feet and ankles that worsens throughout the day.  She also noticed some "spider veins" on the insides of her legs, near her knees.  She also notes some tenderness to palpation in this area.  She does endorse some chronic dyspnea on exertion, especially when going up the stairs.  She also notes some pain in her legs when going up the stairs.  She denies symptoms of claudication.  She denies chest pain.  She states that these symptoms have been occurring for at least 3 years.   ROS noted in HPI.   Past Medical, Surgical, Social, and Family History Reviewed & Updated per EMR.   Pertinent Historical Findings include:   Social History   Tobacco Use  Smoking Status Former Smoker  . Packs/day: 0.30  . Years: 15.00  . Pack years: 4.50  . Types: Cigarettes  . Quit date: 01/01/1998  . Years since quitting: 20.6  Smokeless Tobacco Never Used  Tobacco Comment   "never inhaled"      Objective: BP 110/70   Pulse 77   Wt 264 lb (119.7 kg)   LMP 12/11/2013   SpO2 99%   BMI 43.93 kg/m  Vitals and nursing notes reviewed  Physical Exam:  General: 58 y.o. female in NAD Cardio: RRR no m/r/g Lungs:  CTAB, no wheezing, no rhonchi, no crackles, no IWOB on RA Skin: warm and dry Extremities: Trace BLE edema, adipose deposits with telangiectasia on medial skin inferior to knee bilaterally, mild TTP at that site, 2+ dorsalis pedis pulses GU: Pelvic exam performed with patient supine.  Chaperone in room.  Bilateral labia without abnormalities, no inguinal LAD palpated.  Cervix exhibits no discharge, no cervix abnormalities.  No vaginal lesions.  Vaginal discharge scant physiologic.    No results found for this or any previous visit (from the past 72 hour(s)).  Assessment/Plan:  Screening for malignant neoplasm of cervix No wet prep needed.  No STD testing per patient.  Pap performed today.  Will inform of results.  Venous insufficiency of both lower extremities Given report of likely venous dopplers with decreased flow, worsening BLE edema at end of day, and visible telangiestasias, will refer at patient's request to [facility].  She has their number and will call for appointment.  Has chronic SOB, but has been occurring 3 years and Echo in 09/2017 was WNL, therefore doubt CHF.  2+ dorsalis pedis pulses bilaterally, no evidence of arterial insufficiency.     PATIENT EDUCATION PROVIDED: See AVS    Diagnosis and plan along with any newly prescribed medication(s) were discussed in detail with this patient today. The patient verbalized understanding  and agreed with the plan. Patient advised if symptoms worsen return to clinic or ER.   Health Maintainance: Patient has an appointment with eye doctor coming up, advised her to have them fax [place] records.  Will obtain urine microalbumin, A1c, foot exam, hep C, HIV at next visit.   Orders Placed This Encounter  Procedures  . Ambulatory referral to Vascular Surgery    Referral Priority:   Routine    Referral Type:   Surgical    Referral Reason:   Specialty Services Required    Requested Specialty:   Vascular Surgery    Number of Visits  Requested:   1    No orders of the defined types were placed in this encounter.    Ari[place] Constable, DO 08/11/2018, 4:27 PM PGY-2 [place]

## 2018-08-11 NOTE — Patient Instructions (Signed)
Thank you for coming to see me today. It was a pleasure. Today we talked about:   Your pap: we did this and will tell you results.  I placed a referral to the vascular specialist.  You can call them and schedule an appointment.  If you have any questions or concerns, please do not hesitate to call the office at [phone].  Best,   Arizona Constable, DO

## 2018-08-11 NOTE — Assessment & Plan Note (Signed)
Given report of likely venous dopplers with decreased flow, worsening BLE edema at end of day, and visible telangiestasias, will refer at patient's request to [facility].  She has their number and will call for appointment.  Has chronic SOB, but has been occurring 3 years and Echo in 09/2017 was WNL, therefore doubt CHF.  2+ dorsalis pedis pulses bilaterally, no evidence of arterial insufficiency.

## 2018-08-13 ENCOUNTER — Encounter: Payer: Self-pay | Admitting: Family Medicine

## 2018-08-13 LAB — CYTOLOGY - PAP
Diagnosis: NEGATIVE
HPV: NOT DETECTED

## 2018-08-22 ENCOUNTER — Other Ambulatory Visit: Payer: Self-pay | Admitting: Vascular Surgery

## 2018-08-22 DIAGNOSIS — I872 Venous insufficiency (chronic) (peripheral): Secondary | ICD-10-CM

## 2018-08-22 DIAGNOSIS — I2510 Atherosclerotic heart disease of native coronary artery without angina pectoris: Secondary | ICD-10-CM

## 2018-09-09 ENCOUNTER — Ambulatory Visit (INDEPENDENT_AMBULATORY_CARE_PROVIDER_SITE_OTHER): Payer: Medicaid Other | Admitting: Vascular Surgery

## 2018-09-09 ENCOUNTER — Ambulatory Visit (HOSPITAL_COMMUNITY)
Admission: RE | Admit: 2018-09-09 | Discharge: 2018-09-09 | Disposition: A | Discharge status: Home / Self Care | Payer: Medicaid Other | Source: Ambulatory Visit | Attending: Vascular Surgery | Admitting: Vascular Surgery

## 2018-09-09 ENCOUNTER — Other Ambulatory Visit: Payer: Self-pay

## 2018-09-09 ENCOUNTER — Encounter: Payer: Self-pay | Admitting: Vascular Surgery

## 2018-09-09 VITALS — BP 121/89 | HR 76 | Temp 97.9°F | Resp 20 | Ht 65.0 in | Wt 264.0 lb

## 2018-09-09 DIAGNOSIS — I872 Venous insufficiency (chronic) (peripheral): Secondary | ICD-10-CM | POA: Diagnosis not present

## 2018-09-09 DIAGNOSIS — I2510 Atherosclerotic heart disease of native coronary artery without angina pectoris: Secondary | ICD-10-CM | POA: Insufficient documentation

## 2018-09-09 NOTE — Progress Notes (Signed)
V[facility] and [facility]in Specialist of [place]  Patient name: Brianna Hawkins MRN: [id number] DOB: 1960/02/22 Sex: female  REASON FOR CONSULT: Evaluation bilateral lower extremity discomfort right greater than left  HPI: Brianna Hawkins is a 58 y.o. female, who is here today for evaluation.  She reports discomfort mainly in her right medial calf.  She notes occasional swelling associated with this as well.  She has less discomfort in her left leg.  She reports that when she elevates her legs and wears support hose but this is improved.  She has no history of DVT.  She has no history of skin changes or venous ulceration.  Past Medical History:  Diagnosis Date  . Allergy   . Arthritis   . Blood transfusion    with child birth  . Blood transfusion without reported diagnosis   . Coronary artery calcification seen on CT scan    Coronary CTA 9/19: Ca score = 1 (80th percentile); calcified plaque distal LM; no obstructive CAD.  Marland Kitchen DM2 (diabetes mellitus, type 2) ([place])   . GERD (gastroesophageal reflux disease)   . Headache(784.0)    migraine HAs  . Heart murmur    very mild-echo 1/15-normal  . History of echocardiogram    Echo 9/19: EF 55-60, no RWMA, normal diastolic function, trace MR/TR  . HLD (hyperlipidemia)   . Hypertension   . Migraine   . Pain, joint, shoulder region, right   . Plantar fasciitis of left foot 09/04/2016  . Substance abuse ([place])   . 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  Marland Kitchen Urinary urgency   . Vertigo     Family History  Problem Relation Age of Onset  . Throat cancer Father        was a smoker  . Colon cancer Father   . Alcohol abuse Father   . Cancer Father 14       colon cancer  . HIV Sister   . Alcohol abuse Mother   . Diabetes Mother   . Heart disease Mother   . Cancer Mother   . Diabetes Paternal Grandmother   . Dementia Paternal Grandmother   . Hypertension Paternal Grandmother   . Breast cancer Paternal Grandmother    . Skin cancer Paternal Grandmother   . Diabetes Maternal Grandmother   . Heart disease Sister 10       died age 45 "Heart was weak"  . Stroke Sister   . Sudden Cardiac Death Neg Hx     SOCIAL HISTORY: Social History   Socioeconomic History  . Marital status: Single    Spouse name: Not on file  . Number of children: 4  . Years of education: 14  . Highest education level: Not on file  Occupational History  . 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 Financial resource strain: Not on file  . Food insecurity    Worry: Not on file    Inability: Not on file  . Transportation needs    Medical: Not on file    Non-medical: Not on file  Tobacco Use  . Smoking status: Former Smoker    Packs/day: 0.30    Years: 15.00    Pack years: 4.50    Types: Cigarettes    Quit date: 01/01/1998    Years since quitting: 20.7  . Smokeless tobacco: Never Used  . Tobacco comment: "never inhaled"  Substance and Sexual Activity  . Alcohol  use: Yes    Alcohol/week: 1.0 standard drinks    Types: 1 Glasses of wine per week    Comment: 1-2x per month   . Drug use: No    Comment: Last crack cocaine 1998  . Sexual activity: Not Currently    Birth control/protection: Surgical  Lifestyle  . Physical activity    Days per week: 0 days    Minutes per session: 0 min  . Stress: Not on file  Relationships  . Social Herbalist on phone: Not on file    Gets together: Not on file    Attends religious service: Not on file    Active member of club or organization: Not on file    Attends meetings of clubs or organizations: Not on file    Relationship status: Not on file  . Intimate partner violence    Fear of current or ex partner: Not on file    Emotionally abused: Not on file    Physically abused: Not on file    Forced sexual activity: Not on file  Other Topics Concern  . Not on file  Social History Narrative   Single. Education: [employer].   Right-handed.   1-2 cups caffeine per day.   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    No Known Allergies  Current Outpatient Medications  Medication Sig Dispense Refill  . AGAMATRIX ULTRA-THIN LANCETS MISC Check sugars twice daily before meals 100 each 11  . albuterol (VENTOLIN HFA) 108 (90 Base) MCG/ACT inhaler Inhale 2 puffs into the lungs every 6 (six) hours as needed for wheezing or shortness of breath. 8 g 0  . amLODipine (NORVASC) 10 MG tablet Take 1 tablet (10 mg total) by mouth daily. 90 tablet 3  . aspirin EC 81 MG tablet Take 1 tablet (81 mg total) by mouth daily.    . Blood Glucose Monitoring Suppl (AGAMATRIX PRESTO) w/Device KIT Check sugars twice daily before meals 1 kit 0  . Cholecalciferol (VITAMIN D3) 50 MCG (2000 UT) TABS Take by mouth daily.    Marland Kitchen esomeprazole (NEXIUM) 40 MG capsule TAKE 1 CAPSULE (40 MG TOTAL) BY MOUTH DAILY. 30 capsule 9  . fluticasone (FLONASE) 50 MCG/ACT nasal spray Place 2 sprays into both nostrils daily. 16 g 11  . glucose blood (AGAMATRIX PRESTO TEST) test strip Check blood glucose twice daily before meals 100 each 11  . hydrochlorothiazide (HYDRODIURIL) 25 MG tablet TAKE 1 TABLET BY MOUTH ONCE DAILY 90 tablet 0  . metFORMIN (GLUCOPHAGE-XR) 500 MG 24 hr tablet 1 tab by mouth twice daily with meals 60 tablet 11  . Multiple Vitamins-Minerals (HAIR/SKIN/NAILS) TABS Take 1 tablet by mouth daily.    . nitroGLYCERIN (NITROSTAT) 0.4 MG SL tablet 1 tab under tongue for chest pressure, may repeat every 5 minutes if not relieved for total of 3 doses. 24 tablet 3  . rizatriptan (MAXALT-MLT) 10 MG disintegrating tablet Take 1 tablet (10 mg total) by mouth as needed. May repeat in 2 hours if needed 15 tablet 6  . atorvastatin (LIPITOR) 40 MG tablet Take 1 tablet (40 mg total) by mouth daily. 90 tablet 3   No current facility-administered medications for this visit.     REVIEW OF SYSTEMS:  [X] denotes positive  finding, [ ] denotes negative finding Cardiac  Comments:  Chest pain or chest pressure:    Shortness of breath upon exertion: x   Short of breath when  lying flat:    Irregular heart rhythm:        Vascular    Pain in calf, thigh, or hip brought on by ambulation: x   Pain in feet at night that wakes you up from your sleep:  x   Blood clot in your veins:    Leg swelling:  x       Pulmonary    Oxygen at home:    Productive cough:     Wheezing:         Neurologic    Sudden weakness in arms or legs:  x   Sudden numbness in arms or legs:  x   Sudden onset of difficulty speaking or slurred speech:    Temporary loss of vision in one eye:     Problems with dizziness:  x       Gastrointestinal    Blood in stool:     Vomited blood:         Genitourinary    Burning when urinating:     Blood in urine:        Psychiatric    Major depression:         Hematologic    Bleeding problems:    Problems with blood clotting too easily:        Skin    Rashes or ulcers: x       Constitutional    Fever or chills:      PHYSICAL EXAM: Vitals:   09/09/18 1454  BP: 121/89  Pulse: 76  Resp: 20  Temp: 97.9 F (36.6 C)  SpO2: 96%  Weight: 119.7 kg  Height: 5' 5" (1.651 m)    GENERAL: The patient is a well-nourished female, in no acute distress. The vital signs are documented above. CARDIOVASCULAR: 2+ radial and 2+ dorsalis pedis pulses bilaterally.  No evidence of varicosities.  She does have some raised telangiectasia in the right popliteal space. PULMONARY: There is good air exchange  ABDOMEN: Soft and non-tender  MUSCULOSKELETAL: There are no major deformities or cyanosis. NEUROLOGIC: No focal weakness or paresthesias are detected. SKIN: There are no ulcers or rashes noted. PSYCHIATRIC: The patient has a normal affect.  DATA:  Venous duplex shows no evidence of DVT.  She does have reflux at the groin and also in the saphenous vein in the calf.  There is some dilatation of her  vein  MEDICAL ISSUES: Had long discussion with the patient regarding her venous pathology.  She is obese with a BMI of 44.  Splane the importance of weight loss.  Also explained the importance of keeping her legs elevated above her heart when she can when she is not active.  Also recommended 20 to 30 mm knee-high compression garments and stressed the importance of this.  I do not feel that she has any indication for ablation of her saphenous vein currently and feel that this would give her minimal relief and explained this to her.  She was reassured with this discussion will see [place] again on an as-needed basis   Rosetta Posner, MD [facility] Vascular and Vein Specialists of [facility] Tel [phone] Pager [phone]

## 2018-09-17 ENCOUNTER — Encounter: Payer: Self-pay | Admitting: Vascular Surgery

## 2018-09-24 ENCOUNTER — Encounter: Payer: Self-pay | Admitting: Physician Assistant

## 2018-09-24 ENCOUNTER — Other Ambulatory Visit: Payer: Self-pay

## 2018-09-24 ENCOUNTER — Ambulatory Visit: Payer: Medicaid Other | Admitting: Physician Assistant

## 2018-09-24 VITALS — BP 116/70 | HR 74 | Ht 65.0 in | Wt 265.0 lb

## 2018-09-24 DIAGNOSIS — I2510 Atherosclerotic heart disease of native coronary artery without angina pectoris: Secondary | ICD-10-CM

## 2018-09-24 DIAGNOSIS — I10 Essential (primary) hypertension: Secondary | ICD-10-CM

## 2018-09-24 DIAGNOSIS — R0602 Shortness of breath: Secondary | ICD-10-CM | POA: Diagnosis not present

## 2018-09-24 DIAGNOSIS — E119 Type 2 diabetes mellitus without complications: Secondary | ICD-10-CM

## 2018-09-24 DIAGNOSIS — E785 Hyperlipidemia, unspecified: Secondary | ICD-10-CM | POA: Diagnosis not present

## 2018-09-24 NOTE — Patient Instructions (Signed)
Medication Instructions:  none If you need a refill on your cardiac medications before your next appointment, please call your pharmacy.   Lab work: Schedule in November CMET Fasting Lipid Panel If you have labs (blood work) drawn today and your tests are completely normal, you will receive your results only by: Marland Kitchen MyChart Message (if you have MyChart) OR . A paper copy in the mail If you have any lab test that is abnormal or we need to change your treatment, we will call you to review the results.  Testing/Procedures: none  Follow-Up: 1 year with Brianna Dopp, PA At [facility], you and your health needs are our priority.  As part of our continuing mission to provide you with exceptional heart care, we have created designated Provider Care Teams.  These Care Teams include your primary Cardiologist (physician) and Advanced Practice Providers (APPs -  Physician Assistants and Nurse Practitioners) who all work together to provide you with the care you need, when you need it. .   Any Other Special Instructions Will Be Listed Below (If Applicable).

## 2018-09-24 NOTE — Progress Notes (Signed)
Cardiology Office Note:    Date:  09/24/2018   ID:  Brianna Hawkins, DOB [birth year], MRN [id number]  PCP:  Brianna Dunker, DO  Cardiologist:  Brianna Moores, MD / Brianna Dopp, PA-C  Electrophysiologist:  None   Referring MD: Brianna Hawkins, *   Chief Complaint  Patient presents with  . Follow-up    Coronary Calcification    History of Present Illness:    Brianna Hawkins is a 58 y.o. female with:  Coronary calcification  CTA 9/19: Ca score 1; no obstructive CAD   Hypertension  Diabetes mellitus  Hyperlipidemia  GERD  Remote smoking history  Possible family history of CAD  Venous insufficiency  Eval by Dr. Donnetta Hawkins in 09/2018 >> no indication for vein ablation  [name] was last seen in September 2019.   She returns for follow-up.  She is here alone.  She has chronic shortness of breath with exertion.  This is been stable for the past few years.  Her primary care provider recently placed her on albuterol, which helps.  She sleeps on an incline chronically.  She has lower extremity swelling related to venous insufficiency.  She has not had chest pain, syncope.    Prior CV studies:   The following studies were reviewed today:  Coronary CTA 09/20/17 IMPRESSION: 1. Coronary artery calcium score 1 Agatston unit, placing the patient in the 80th percentile for age and gender. Given relatively young age, this suggests high risk for future cardiac events. 2. No obstructive coronary disease.  Echo 09/10/17 EF 55-60, no RWMA, normal diastolic function, trace MR/TR  Event monitor 03/2012 Normal sinus rhythm  Echocardiogram 01/2010 Reportedly normal-EF 63%, mild LAE   Past Medical History:  Diagnosis Date  . Allergy   . Arthritis   . Blood transfusion    with child birth  . Blood transfusion without reported diagnosis   . Coronary artery calcification seen on CT scan    Coronary CTA 9/19: Ca score = 1 (80th percentile); calcified plaque  distal LM; no obstructive CAD.  Marland Kitchen DM2 (diabetes mellitus, type 2) ([place])   . GERD (gastroesophageal reflux disease)   . Headache(784.0)    migraine HAs  . Heart murmur    very mild-echo 1/15-normal  . History of echocardiogram    Echo 9/19: EF 55-60, no RWMA, normal diastolic function, trace MR/TR  . HLD (hyperlipidemia)   . Hypertension   . Migraine   . Pain, joint, shoulder region, right   . Plantar fasciitis of left foot 09/04/2016  . Substance abuse ([place])   . 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  Marland Kitchen Urinary urgency   . Vertigo    Surgical Hx: The patient  has a past surgical history that includes Colonoscopy (age 69 yo); Upper gi endoscopy; Ethmoidectomy (Left, 07/10/2013); Turbinate reduction (Left, 07/10/2013); Nose surgery (2015); Tubal ligation (1980s); and Breast biopsy (2012).   Current Medications: Current Meds  Medication Sig  . AGAMATRIX ULTRA-THIN LANCETS MISC Check sugars twice daily before meals  . albuterol (VENTOLIN HFA) 108 (90 Base) MCG/ACT inhaler Inhale 2 puffs into the lungs every 6 (six) hours as needed for wheezing or shortness of breath.  Marland Kitchen amLODipine (NORVASC) 10 MG tablet Take 1 tablet (10 mg total) by mouth daily.  Marland Kitchen aspirin EC 81 MG tablet Take 1 tablet (81 mg total) by mouth daily.  Marland Kitchen atorvastatin (LIPITOR) 40 MG tablet Take 1 tablet (40 mg total) by mouth daily.  . Blood Glucose Monitoring Suppl (AGAMATRIX  PRESTO) w/Device KIT Check sugars twice daily before meals  . Cholecalciferol (VITAMIN D3) 50 MCG (2000 UT) TABS Take by mouth daily.  Marland Kitchen esomeprazole (NEXIUM) 40 MG capsule TAKE 1 CAPSULE (40 MG TOTAL) BY MOUTH DAILY.  . fluticasone (FLONASE) 50 MCG/ACT nasal spray Place 2 sprays into both nostrils daily. (Patient taking differently: Place 2 sprays into both nostrils daily as needed for allergies. )  . glucose blood (AGAMATRIX PRESTO TEST) test strip Check blood glucose twice daily before meals  . hydrochlorothiazide (HYDRODIURIL) 25 MG tablet TAKE 1 TABLET  BY MOUTH ONCE DAILY  . metFORMIN (GLUCOPHAGE-XR) 500 MG 24 hr tablet 1 tab by mouth twice daily with meals  . Multiple Vitamins-Minerals (HAIR/SKIN/NAILS) TABS Take 1 tablet by mouth daily.  . nitroGLYCERIN (NITROSTAT) 0.4 MG SL tablet 1 tab under tongue for chest pressure, may repeat every 5 minutes if not relieved for total of 3 doses.  . rizatriptan (MAXALT) 10 MG tablet Take 10 mg by mouth as needed for migraine. May repeat in 2 hours if needed     Allergies:   Patient has no known allergies.   Social History   Tobacco Use  . Smoking status: Former Smoker    Packs/day: 0.30    Years: 15.00    Pack years: 4.50    Types: Cigarettes    Quit date: 01/01/1998    Years since quitting: 20.7  . Smokeless tobacco: Never Used  . Tobacco comment: "never inhaled"  Substance Use Topics  . Alcohol use: Yes    Alcohol/week: 1.0 standard drinks    Types: 1 Glasses of wine per week    Comment: 1-2x per month   . Drug use: No    Comment: Last crack cocaine 1998     Family Hx: The patient's family history includes Alcohol abuse in her father and mother; Breast cancer in her paternal grandmother; Cancer in her mother; Cancer (age of onset: 6) in her father; Colon cancer in her father; Dementia in her paternal grandmother; Diabetes in her maternal grandmother, mother, and paternal grandmother; HIV in her sister; Heart disease in her mother; Heart disease (age of onset: 1) in her sister; Hypertension in her paternal grandmother; Skin cancer in her paternal grandmother; Stroke in her sister; Throat cancer in her father. There is no history of Sudden Cardiac Death.  ROS:   Please see the history of present illness.    ROS All other systems reviewed and are negative.   EKGs/Labs/Other Test Reviewed:    EKG:  EKG is  ordered today.  The ekg ordered today demonstrates normal sinus rhythm, heart rate 74, normal axis, nonspecific ST-T wave changes, QTC 421  Recent Labs: 11/07/2017: ALT 10; BUN 17;  Creatinine, Ser 0.62; Potassium 3.9; Sodium 142   Recent Lipid Panel Lab Results  Component Value Date/Time   CHOL 141 11/07/2017 12:14 PM   TRIG 97 11/07/2017 12:14 PM   HDL 39 (L) 11/07/2017 12:14 PM   CHOLHDL 3.5 10/02/2012 09:34 AM   LDLCALC 83 11/07/2017 12:14 PM    Physical Exam:    VS:  BP 116/70   Pulse 74   Ht 5' 5"  (1.651 m)   Wt 265 lb (120.2 kg)   LMP 12/11/2013   SpO2 98%   BMI 44.10 kg/m     Wt Readings from Last 3 Encounters:  09/24/18 265 lb (120.2 kg)  09/09/18 264 lb (119.7 kg)  08/11/18 264 lb (119.7 kg)     Physical Exam  Constitutional: She  is oriented to person, place, and time. She appears well-developed and well-nourished. No distress.  HENT:  Head: Normocephalic and atraumatic.  Eyes: No scleral icterus.  Neck: Neck supple. No JVD present. No thyromegaly present.  Cardiovascular: Normal rate, regular rhythm, S1 normal, S2 normal and normal heart sounds.  No murmur heard. Pulmonary/Chest: Effort normal and breath sounds normal. She has no rales.  Abdominal: Soft. There is no hepatomegaly.  Musculoskeletal:        General: No edema.  Lymphadenopathy:    She has no cervical adenopathy.  Neurological: She is alert and oriented to person, place, and time.  Skin: Skin is warm and dry.  Psychiatric: She has a normal mood and affect.    ASSESSMENT & PLAN:    1. Coronary artery calcification seen on CT scan Calcium score 1 on CT September 2019.  Continue aspirin, high intensity statin.  Continue aggressive risk factor modification.  2. Essential hypertension The patient's blood pressure is controlled on her current regimen.  Continue current therapy.   3. Hyperlipidemia, unspecified hyperlipidemia type LDL optimal on most recent lab work.  Continue current Rx.  Arrange follow-up fasting CMP, lipids in November 2020.  4. Shortness of breath She has chronic shortness of breath with exertion.  She has noted improvement on rescue inhalers.   Follow-up with primary care.  No further cardiac work-up indicated.  5. Type 2 diabetes mellitus without complication, without long-term current use of insulin (HCC) Recent A1c 6.4.  Continue follow-up with primary care.    Dispo:  Return in about 1 year (around 09/24/2019) for Routine Follow Up, w/ Brianna Dopp, PA-C.   Medication Adjustments/Labs and Tests Ordered: Current medicines are reviewed at length with the patient today.  Concerns regarding medicines are outlined above.  Tests Ordered: Orders Placed This Encounter  Procedures  . Comp Met (CMET)  . Lipid Profile  . EKG 12-Lead   Medication Changes: No orders of the defined types were placed in this encounter.   Signed, Brianna Dopp, PA-C  09/24/2018 5:50 PM    [facility] [address], [place], [place]  [address] Phone: [phone]; Fax: [phone]

## 2018-11-09 ENCOUNTER — Other Ambulatory Visit: Payer: Self-pay | Admitting: Internal Medicine

## 2018-11-09 DIAGNOSIS — I10 Essential (primary) hypertension: Secondary | ICD-10-CM

## 2018-11-11 ENCOUNTER — Other Ambulatory Visit: Payer: Medicaid Other

## 2018-11-11 ENCOUNTER — Other Ambulatory Visit: Payer: Self-pay | Admitting: Internal Medicine

## 2018-11-11 ENCOUNTER — Other Ambulatory Visit: Payer: Self-pay

## 2018-11-11 DIAGNOSIS — I10 Essential (primary) hypertension: Secondary | ICD-10-CM

## 2018-11-11 DIAGNOSIS — R0602 Shortness of breath: Secondary | ICD-10-CM

## 2018-11-11 DIAGNOSIS — E119 Type 2 diabetes mellitus without complications: Secondary | ICD-10-CM

## 2018-11-11 DIAGNOSIS — E785 Hyperlipidemia, unspecified: Secondary | ICD-10-CM

## 2018-11-11 DIAGNOSIS — I2510 Atherosclerotic heart disease of native coronary artery without angina pectoris: Secondary | ICD-10-CM

## 2018-11-12 ENCOUNTER — Encounter: Payer: Self-pay | Admitting: Orthopedic Surgery

## 2018-11-12 ENCOUNTER — Encounter: Payer: Self-pay | Admitting: Family Medicine

## 2018-11-12 ENCOUNTER — Other Ambulatory Visit: Payer: Self-pay

## 2018-11-12 ENCOUNTER — Ambulatory Visit (INDEPENDENT_AMBULATORY_CARE_PROVIDER_SITE_OTHER): Payer: Medicaid Other

## 2018-11-12 ENCOUNTER — Ambulatory Visit: Payer: Medicaid Other | Admitting: Orthopedic Surgery

## 2018-11-12 ENCOUNTER — Ambulatory Visit (INDEPENDENT_AMBULATORY_CARE_PROVIDER_SITE_OTHER): Payer: Medicaid Other | Admitting: Family Medicine

## 2018-11-12 ENCOUNTER — Telehealth: Payer: Self-pay | Admitting: Radiology

## 2018-11-12 VITALS — BP 122/82 | HR 79 | Wt 267.2 lb

## 2018-11-12 DIAGNOSIS — M25571 Pain in right ankle and joints of right foot: Secondary | ICD-10-CM | POA: Diagnosis not present

## 2018-11-12 DIAGNOSIS — E119 Type 2 diabetes mellitus without complications: Secondary | ICD-10-CM

## 2018-11-12 DIAGNOSIS — M76821 Posterior tibial tendinitis, right leg: Secondary | ICD-10-CM

## 2018-11-12 DIAGNOSIS — M25561 Pain in right knee: Secondary | ICD-10-CM | POA: Diagnosis not present

## 2018-11-12 DIAGNOSIS — Z9109 Other allergy status, other than to drugs and biological substances: Secondary | ICD-10-CM

## 2018-11-12 DIAGNOSIS — M1711 Unilateral primary osteoarthritis, right knee: Secondary | ICD-10-CM | POA: Diagnosis not present

## 2018-11-12 DIAGNOSIS — G8929 Other chronic pain: Secondary | ICD-10-CM | POA: Diagnosis not present

## 2018-11-12 DIAGNOSIS — J329 Chronic sinusitis, unspecified: Secondary | ICD-10-CM

## 2018-11-12 LAB — COMPREHENSIVE METABOLIC PANEL
ALT: 20 IU/L (ref 0–32)
AST: 21 IU/L (ref 0–40)
Albumin/Globulin Ratio: 2.1 (ref 1.2–2.2)
Albumin: 4 g/dL (ref 3.8–4.9)
Alkaline Phosphatase: 102 IU/L (ref 39–117)
BUN/Creatinine Ratio: 22 (ref 9–23)
BUN: 12 mg/dL (ref 6–24)
Bilirubin Total: 0.4 mg/dL (ref 0.0–1.2)
CO2: 24 mmol/L (ref 20–29)
Calcium: 9.2 mg/dL (ref 8.7–10.2)
Chloride: 103 mmol/L (ref 96–106)
Creatinine, Ser: 0.54 mg/dL — ABNORMAL LOW (ref 0.57–1.00)
GFR calc Af Amer: 123 mL/min/{1.73_m2} (ref >59)
GFR calc non Af Amer: 107 mL/min/{1.73_m2} (ref >59)
Globulin, Total: 1.9 g/dL (ref 1.5–4.5)
Glucose: 102 mg/dL — ABNORMAL HIGH (ref 65–99)
Potassium: 3.7 mmol/L (ref 3.5–5.2)
Sodium: 141 mmol/L (ref 134–144)
Total Protein: 5.9 g/dL — ABNORMAL LOW (ref 6.0–8.5)

## 2018-11-12 LAB — LIPID PANEL
Chol/HDL Ratio: 3.6 ratio (ref 0.0–4.4)
Cholesterol, Total: 141 mg/dL (ref 100–199)
HDL: 39 mg/dL — ABNORMAL LOW (ref >39)
LDL Chol Calc (NIH): 84 mg/dL (ref 0–99)
Triglycerides: 93 mg/dL (ref 0–149)
VLDL Cholesterol Cal: 18 mg/dL (ref 5–40)

## 2018-11-12 LAB — POCT GLYCOSYLATED HEMOGLOBIN (HGB A1C): HbA1c, POC (controlled diabetic range): 6.3 % (ref 0.0–7.0)

## 2018-11-12 MED ORDER — AMOXICILLIN-POT CLAVULANATE 875-125 MG PO TABS: 1.0000 | ORAL_TABLET | Freq: Two times a day (BID) | ORAL | 0 refills | Status: DC

## 2018-11-12 MED ORDER — FLUTICASONE PROPIONATE 50 MCG/ACT NA SUSP: 2.0000 | Freq: Two times a day (BID) | NASAL | 11 refills | Status: DC

## 2018-11-12 NOTE — Patient Instructions (Signed)

## 2018-11-12 NOTE — Progress Notes (Signed)
     Subjective: HPI: Brianna Hawkins is a 58 y.o. presenting to clinic today to discuss the following:  Sinus Infection Patient is a 57y/o female with PMH of HTN, T2DM, chronic sinusitis, and BPPV presenting today for concern for a sinus infection. She states she had nasal polyps removed in 2017 with Dr. Lucia Gaskins here in [place] and since then has had multiple recurrent infections in her sinuses. She states this most recent one started 2-3 months ago with thick, green, mucus discharge coming from her nasal passages with lots of sinus congestion and pressure. She denies any fever, chills, sore throat, or cough. No difficulty breathing or SOB. She has not been taking flonase regularly. She had not tried any nasal rinse. She did develop a headache yesterday and decided it was time to come in.     ROS noted in HPI.    Social History   Tobacco Use  Smoking Status Former Smoker  . Packs/day: 0.30  . Years: 15.00  . Pack years: 4.50  . Types: Cigarettes  . Quit date: 01/01/1998  . Years since quitting: 20.8  Smokeless Tobacco Never Used  Tobacco Comment   "never inhaled"    Objective: LMP 12/11/2013  Vitals and nursing notes reviewed  Physical Exam Gen: Alert and Oriented x 3, NAD HEENT: Normocephalic, atraumatic, PERRLA, EOMI,  Left turbinate swollen and erythematous, right turbinate normal, non-erythematous pharyngeal mucosa, no exudates, patient has left sided maxillary tenderness on percussion Neck: no LAD Ext: no clubbing, cyanosis, or edema Skin: warm, dry, intact, no rashes  Results for orders placed or performed in visit on 11/12/18 (from the past 72 hour(s))  HgB A1c     Status: None   Collection Time: 11/12/18  8:56 AM  Result Value Ref Range   Hemoglobin A1C     HbA1c POC (<> result, manual entry)     HbA1c, POC (prediabetic range)     HbA1c, POC (controlled diabetic range) 6.3 0.0 - 7.0 %    Assessment/Plan:  Chronic sinusitis Most likely chronic sinusitis at  this point given this has been occurring and reoccuring since 2017. Given her most recent symptoms have been ongoing for 2-3 months will treat with antibiotics. - Augmentin for 10 days - At home NeilMed Nasal rinse system given to patient today - Flonase 2 sprays into each nostril BID - F/u with [place] in 4 weeks; if no improvement or if she has reoccurence of symptoms in 3-6 months I would refer her back to Dr. Lucia Gaskins for more eval   PATIENT EDUCATION PROVIDED: See AVS    Diagnosis and plan along with any newly prescribed medication(s) were discussed in detail with this patient today. The patient verbalized understanding and agreed with the plan. Patient advised if symptoms worsen return to clinic or ER.    Orders Placed This Encounter  Procedures  . HgB A1c    Meds ordered this encounter  Medications  . amoxicillin-clavulanate (AUGMENTIN) 875-125 MG tablet    Sig: Take 1 tablet by mouth 2 (two) times daily.    Dispense:  20 tablet    Refill:  0  . fluticasone (FLONASE) 50 MCG/ACT nasal spray    Sig: Place 2 sprays into both nostrils 2 (two) times daily.    Dispense:  16 g    Refill:  [place], DO 11/12/2018, 8:55 AM PGY-3 [place]

## 2018-11-12 NOTE — Telephone Encounter (Signed)
Request approval right knee gel injection

## 2018-11-12 NOTE — Assessment & Plan Note (Signed)
Most likely chronic sinusitis at this point given this has been occurring and reoccuring since 2017. Given her most recent symptoms have been ongoing for 2-3 months will treat with antibiotics. - Augmentin for 10 days - At home NeilMed Nasal rinse system given to patient today - Flonase 2 sprays into each nostril BID - F/u with [place] in 4 weeks; if no improvement or if she has reoccurence of symptoms in 3-6 months I would refer her back to Dr. Lucia Gaskins for more eval

## 2018-11-13 NOTE — Telephone Encounter (Signed)
Patient's insurance does not cover visco supplementation.  Will call her to discuss/offer financial assistance program for Monovisc.

## 2018-11-13 NOTE — Telephone Encounter (Signed)
IC patient and advised her that I am mailing her some forms to fill out and return to the [place] office to April, for patient assistance [employer].

## 2018-11-14 ENCOUNTER — Encounter: Payer: Self-pay | Admitting: Orthopedic Surgery
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
## 2018-11-21 NOTE — Telephone Encounter (Signed)
See below. Forms on your desk.

## 2018-11-25 NOTE — Telephone Encounter (Signed)
Noted. Received the forms.

## 2018-12-11 ENCOUNTER — Telehealth: Payer: Self-pay

## 2018-12-11 NOTE — Telephone Encounter (Signed)
Received [employer] application from patient for Monovisc injection. Faxed completed application and insurance card to [employer] at [phone].

## 2019-01-01 ENCOUNTER — Telehealth: Payer: Self-pay

## 2019-01-01 NOTE — Telephone Encounter (Signed)
Called and left a VM advising patient to call back to schedule an appointment for gel injection with Dr. Marlou Sa.  Received Monovisc injection for right knee. Purchased through [employer] and [employer] Patient Assistance.

## 2019-01-16 ENCOUNTER — Ambulatory Visit: Payer: Medicaid Other | Admitting: Orthopedic Surgery

## 2019-01-16 ENCOUNTER — Encounter: Payer: Self-pay | Admitting: Orthopedic Surgery

## 2019-01-16 ENCOUNTER — Other Ambulatory Visit: Payer: Self-pay

## 2019-01-16 DIAGNOSIS — M1711 Unilateral primary osteoarthritis, right knee: Secondary | ICD-10-CM | POA: Diagnosis not present

## 2019-01-16 MED ORDER — LIDOCAINE HCL 1 % IJ SOLN
5.0000 mL | INTRAMUSCULAR | Status: AC | PRN
  Administered 2019-01-16: 5 mL

## 2019-01-16 MED ORDER — HYALURONAN 88 MG/4ML IX SOSY
88.0000 mg | PREFILLED_SYRINGE | INTRA_ARTICULAR | Status: AC | PRN
  Administered 2019-01-16: 88 mg via INTRA_ARTICULAR

## 2019-01-16 NOTE — Progress Notes (Signed)
   Procedure Note  Patient: Brianna Hawkins             Date of Birth: Jul 20, 1960           MRN: [id number]             Visit Date: 01/16/2019  Procedures: Visit Diagnoses:  1. Primary osteoarthritis of right knee     Large Joint Inj: R knee on 01/16/2019 10:51 AM Indications: pain, joint swelling and diagnostic evaluation Details: 18 G 1.5 in needle, superolateral approach  Arthrogram: No  Medications: 5 mL lidocaine 1 %; 88 mg Hyaluronan 88 MG/4ML Outcome: tolerated well, no immediate complications Procedure, treatment alternatives, risks and benefits explained, specific risks discussed. Consent was given by the patient. Immediately prior to procedure a time out was called to verify the correct patient, procedure, equipment, support staff and site/side marked as required. Patient was prepped and draped in the usual sterile fashion.

## 2019-03-24 ENCOUNTER — Ambulatory Visit: Payer: Medicaid Other | Admitting: Licensed Clinical Social Worker

## 2019-03-24 ENCOUNTER — Ambulatory Visit (INDEPENDENT_AMBULATORY_CARE_PROVIDER_SITE_OTHER): Payer: Medicaid Other | Admitting: Family Medicine

## 2019-03-24 ENCOUNTER — Encounter: Payer: Self-pay | Admitting: Family Medicine

## 2019-03-24 ENCOUNTER — Other Ambulatory Visit: Payer: Self-pay

## 2019-03-24 VITALS — BP 106/60 | HR 90 | Ht 64.0 in | Wt 256.8 lb

## 2019-03-24 DIAGNOSIS — F329 Major depressive disorder, single episode, unspecified: Secondary | ICD-10-CM

## 2019-03-24 DIAGNOSIS — R0602 Shortness of breath: Secondary | ICD-10-CM | POA: Diagnosis not present

## 2019-03-24 DIAGNOSIS — F321 Major depressive disorder, single episode, moderate: Secondary | ICD-10-CM

## 2019-03-24 DIAGNOSIS — R4589 Other symptoms and signs involving emotional state: Secondary | ICD-10-CM

## 2019-03-24 DIAGNOSIS — R5383 Other fatigue: Secondary | ICD-10-CM

## 2019-03-24 DIAGNOSIS — E119 Type 2 diabetes mellitus without complications: Secondary | ICD-10-CM

## 2019-03-24 DIAGNOSIS — Z636 Dependent relative needing care at home: Secondary | ICD-10-CM

## 2019-03-24 LAB — POCT GLYCOSYLATED HEMOGLOBIN (HGB A1C): HbA1c, POC (controlled diabetic range): 6.4 % (ref 0.0–7.0)

## 2019-03-24 MED ORDER — FLUOXETINE HCL 20 MG PO TABS: 20.0000 mg | ORAL_TABLET | Freq: Every day | ORAL | 3 refills | Status: DC

## 2019-03-24 MED ORDER — ATORVASTATIN CALCIUM 40 MG PO TABS: 40.0000 mg | ORAL_TABLET | Freq: Every day | ORAL | 3 refills | Status: DC

## 2019-03-24 NOTE — Progress Notes (Signed)
    SUBJECTIVE:   CHIEF COMPLAINT / HPI:   Fatigue: Feels fatigued all the time. During a typical day she cooks breakfast and then sits on the couch.  Gets out of breath walking to the bathroom.  Wakes up several times A night. Sleeps from 3-9am.  No naps during the day. Has some SOB if laying flat on her back.  Lays on 3-4 pillows.  Has had some leg swelling for 3 months.   No reported HTN.  She has occasional "sharp, pinpoint" pain in her breast.  This is not worse with movement, no crushing pain, no diaphoresis or sweating.  She has nitroglycerin tablets but has not taken them in 8 months.  Social stressors:Has been out of work since 2019.  Worked at [employer] long for 10 years before getting fired.  She is not currently looking for work as she is having to take care of her spouse who is sick with COPD.  This has been very taxing on her.  She did not "sign up for this" when they started dating and she states he he he hid his illness from her.  She has to take care of him 24/7 and he has refused home health aides in the past.  PERTINENT  PMH / [place]: Hypertension, diabetes, migraines, palpitations  OBJECTIVE:   BP 106/60   Pulse 90   Ht 5\' 4"  (1.626 m)   Wt 256 lb 12.8 oz (116.5 kg)   LMP 12/11/2013   SpO2 95%   BMI 44.08 kg/m   General: Alert, oriented.  No acute physical distress.  Mild to moderate emotional distress. HEENT: No subconjunctival pallor. Neck: No thyromegaly CV: Regular rate and rhythm Pulmonary: Lungs clear to auscultation bilaterally.  No crackles. Extremity: No appreciable pitting edema Psych: Patient appears emotionally distraught when talking about having to deal with her partners health issues.  Becomes tearful.  ASSESSMENT/PLAN:   Fatigue Likely multifactorial with a major component being depression/stress.  There are some concerning aspects of her history including getting out of breath while walking to the bathroom, orthopnea, and complaints of leg swelling  (although no leg swelling appreciated on physical exam). 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  Our social worker, Brianna Hawkins, spoke with the patient and provided her with information for therapist as well as caregiver resources and support groups.  Patient was started on fluoxetine 20 mg daily as well.  Follow-up appointment scheduled with PCP.     Brianna Pike, MD [place]

## 2019-03-24 NOTE — Patient Instructions (Addendum)
It was nice to meet you today,  I have started a medicine called fluoxetine that you should take once a day.  You should start to see effects around 4 weeks, therefore like to have you come back in approximately 5 weeks for reevaluation.  I am also working up other causes of your fatigue and will be getting blood work and an echocardiogram to evaluate your heart.  If all this is normal we may consider getting a sleep study in the future to check for obstructive sleep apnea.  Have a great day,  Clemetine Marker, MD

## 2019-03-25 ENCOUNTER — Telehealth: Payer: Self-pay | Admitting: *Deleted

## 2019-03-25 LAB — CBC
Hematocrit: 37.3 % (ref 34.0–46.6)
Hemoglobin: 12.7 g/dL (ref 11.1–15.9)
MCH: 29.5 pg (ref 26.6–33.0)
MCHC: 34 g/dL (ref 31.5–35.7)
MCV: 87 fL (ref 79–97)
Platelets: 318 10*3/uL (ref 150–450)
RBC: 4.3 x10E6/uL (ref 3.77–5.28)
RDW: 13.9 % (ref 11.7–15.4)
WBC: 6.5 10*3/uL (ref 3.4–10.8)

## 2019-03-25 LAB — BASIC METABOLIC PANEL
BUN/Creatinine Ratio: 18 (ref 9–23)
BUN: 12 mg/dL (ref 6–24)
CO2: 26 mmol/L (ref 20–29)
Calcium: 9.6 mg/dL (ref 8.7–10.2)
Chloride: 103 mmol/L (ref 96–106)
Creatinine, Ser: 0.66 mg/dL (ref 0.57–1.00)
GFR calc Af Amer: 114 mL/min/{1.73_m2} (ref >59)
GFR calc non Af Amer: 99 mL/min/{1.73_m2} (ref >59)
Glucose: 81 mg/dL (ref 65–99)
Potassium: 3.8 mmol/L (ref 3.5–5.2)
Sodium: 141 mmol/L (ref 134–144)

## 2019-03-25 LAB — TSH: TSH: 0.912 u[IU]/mL (ref 0.450–4.500)

## 2019-03-25 NOTE — Patient Instructions (Signed)
Licensed Clinical Social Worker Visit Information Ms. Rovito  it was nice speaking with you in the office. Please call me directly if you have questions [phone] Goals we discussed today:  Goals Addressed            This Visit's Progress   . mental health support       CARE PLAN ENTRY (see longitudinal plan of care for additional care plan information) Current Barriers:  . Patient with DMII, history of depression and caregiver stress Acknowledges deficits with connecting to mental health provider for ongoing counseling.  . Patient is experiencing symptoms of stress which seem to be exacerbated being a caregiver.     . Patient needs Support, Education, and Care Coordination in order to meet unmet mental health needs  . Lacks knowledge of community resource: to assist with caring for loved one Clinical Social Work Goal(s):  Marland Kitchen Over the next 30 days, patient will work with SW by telephone to reduce or manage symptoms of stress until connected for ongoing counseling resources.  . Patient will implement clinical interventions discussed today to decreases symptoms of stress and increase knowledge and/or ability of: coping skills and stress reduction. Interventions:  . Assessed patient's understanding, education, previous treatment and care coordination needs  . Provided basic mental health support, education and interventions  . Collaborated with Dr. Jeannine Kitten regarding patient needs . Discussed several options for long term counseling, group counseling based on need and insurance. Mental health [facility] and [facility] . Discussed community support options and provided list to assist with caring for loved one ( [facility], [employer], and [facility])  . Other interventions include: Solution-Focused Strategies  Patient Self Care Activities & Deficits:  . Patient is unable to independently navigate community resource options without care coordination support . Patient is able to  implement clinical interventions discussed today and is motivated for treatment  . Patient will select one of the agencies from the list provided and call to schedule an appointment  Initial goal documentation      Materials provided: Yes: caregiver support information and resources Ms. Calver received Care Management services today:  1. Care Management services include personalized support from designated clinical staff supervised by her physician, including individualized plan of care and coordination with other care providers 2. 24/7 contact [phone] for assistance for urgent and routine care needs. 3. Care Management are voluntary services and be declined at any time by calling the office.  Patient  verbally agreed to assistance and services provided by embedded care coordination/care management team today.   Patient verbalizes understanding of instructions provided today.  Follow up plan:  SW will follow up with patient by phone over the next 2 weeks  Maurine Cane, LCSW

## 2019-03-25 NOTE — Chronic Care Management (AMB) (Signed)
[place] Screening  03/25/2019 Name: Brianna Hawkins MRN: [id number] DOB: Jan 11, 1960 Brianna Hawkins is a 59 y.o. year old female who sees Meccariello, Bernita Raisin, DO for primary care.  LCSW was consulted by Dr. Jeannine Kitten to assess mental health needs and assist the patient with Caregiver Stress.  Presenting issue / symptoms/concerns: stress, positive on PHQ-9 Psychiatric History - Diagnoses: history of depression -- Pharmacotherapy: provider started fluoxetine ( prozac) today - Outpatient therapy: several years ago Assessment: LCSW met briefly with patient prior to leave the office. Patient is currently experiencing symptoms of stress which are exacerbated by caregiver stress. Patient assessed by Dr. Jeannine Kitten and determined to be safe to leave office.  Patient is not experiencing SI. No plan. Thought of not wanting to be here due to stress with current responsibilities. See Care Plan for related entries.  Recommendation: Patient may benefit from, and is in agreement to reconnect with her therapist, explore group therapy options and explore community options to support her as a caregiver.  OBJECTIVE: PHQ-9 score of 22 is an indication of severe depression. Depression screen [facility] 2/9 03/25/2019 03/24/2019 11/12/2018  Decreased Interest 3 3 0  Down, Depressed, Hopeless 3 1 0  PHQ - 2 Score 6 4 0  Altered sleeping 3 - -  Tired, decreased energy 3 - -  Change in appetite 2 - -  Feeling bad or failure about yourself  3 - -  Trouble concentrating 0 - -  Moving slowly or fidgety/restless 2 - -  Suicidal thoughts 3 - -  PHQ-9 Score 22 - -   Outpatient Encounter Medications as of 03/24/2019  Medication Sig  . AGAMATRIX ULTRA-THIN LANCETS MISC Check sugars twice daily before meals  . albuterol (VENTOLIN HFA) 108 (90 Base) MCG/ACT inhaler Inhale 2 puffs into the lungs every 6 (six) hours as needed for wheezing or shortness of breath.  Marland Kitchen amLODipine (NORVASC) 10 MG  tablet Take 1 tablet (10 mg total) by mouth daily.  Marland Kitchen amoxicillin-clavulanate (AUGMENTIN) 875-125 MG tablet Take 1 tablet by mouth 2 (two) times daily.  Marland Kitchen aspirin EC 81 MG tablet Take 1 tablet (81 mg total) by mouth daily.  Marland Kitchen atorvastatin (LIPITOR) 40 MG tablet Take 1 tablet (40 mg total) by mouth daily.  . Blood Glucose Monitoring Suppl (AGAMATRIX PRESTO) w/Device KIT Check sugars twice daily before meals  . Cholecalciferol (VITAMIN D3) 50 MCG (2000 UT) TABS Take by mouth daily.  Marland Kitchen esomeprazole (NEXIUM) 40 MG capsule TAKE 1 CAPSULE (40 MG TOTAL) BY MOUTH DAILY.  Marland Kitchen FLUoxetine (PROZAC) 20 MG tablet Take 1 tablet (20 mg total) by mouth daily.  . fluticasone (FLONASE) 50 MCG/ACT nasal spray Place 2 sprays into both nostrils 2 (two) times daily.  Marland Kitchen glucose blood (AGAMATRIX PRESTO TEST) test strip Check blood glucose twice daily before meals  . hydrochlorothiazide (HYDRODIURIL) 25 MG tablet TAKE 1 TABLET BY MOUTH ONCE DAILY  . metFORMIN (GLUCOPHAGE-XR) 500 MG 24 hr tablet 1 tab by mouth twice daily with meals  . Multiple Vitamins-Minerals (HAIR/SKIN/NAILS) TABS Take 1 tablet by mouth daily.  . nitroGLYCERIN (NITROSTAT) 0.4 MG SL tablet 1 tab under tongue for chest pressure, may repeat every 5 minutes if not relieved for total of 3 doses.  . rizatriptan (MAXALT) 10 MG tablet Take 10 mg by mouth as needed for migraine. May repeat in 2 hours if needed   No facility-administered encounter medications on file as of 03/24/2019.   Review of patient status,  including review of consultants reports, relevant laboratory and other test results, and collaboration with appropriate care team members and the patient's provider was performed as part of comprehensive patient evaluation and provision of care management services.   SDOH (Social Determinants of Health) assessments performed: Yes SDOH Interventions     Most Recent Value  SDOH Interventions  SDOH Interventions for the Following Domains  Stress, Depression   Stress Interventions  Provide Counseling  Depression Interventions/Treatment   -- [resources provided]      Goals Addressed            This Visit's Progress   . mental health support       CARE PLAN ENTRY (see longitudinal plan of care for additional care plan information) Current Barriers:  . Patient with DMII, history of depression and caregiver stress Acknowledges deficits with connecting to mental health provider for ongoing counseling.  . Patient is experiencing symptoms of stress which seem to be exacerbated being a caregiver.     . Patient needs Support, Education, and Care Coordination in order to meet unmet mental health needs  . Lacks knowledge of community resource: to assist with caring for loved one Clinical Social Work Goal(s):  Marland Kitchen Over the next 30 days, patient will work with SW by telephone to reduce or manage symptoms of stress until connected for ongoing counseling resources.  . Patient will implement clinical interventions discussed today to decreases symptoms of stress and increase knowledge and/or ability of: coping skills and stress reduction. Interventions:  . Assessed patient's understanding, education, previous treatment and care coordination needs  . Provided basic mental health support, education and interventions  . Collaborated with Dr. Jeannine Kitten regarding patient needs . Discussed several options for long term counseling, group counseling based on need and insurance. Mental health [facility] and [facility] . Discussed community support options and provided list to assist with caring for loved one ( [facility], [employer], and [facility])  . Other interventions include: Solution-Focused Strategies  Patient Self Care Activities & Deficits:  . Patient is unable to independently navigate community resource options without care coordination support . Patient is able to implement clinical interventions discussed today and is motivated for treatment   . Patient will select one of the agencies from the list provided and call to schedule an appointment  Initial goal documentation       Plan:  1. Patient is open to follow up and asked that LCSW contact her after April 7th  2.F/U phone call scheduled April 9th   Casimer Lanius, [place] Worker [place] / [place]   [phone] 9:17 AM

## 2019-03-25 NOTE — Telephone Encounter (Signed)
Received fax from [facility] stating that for the patients fluoxetine 20 mg it will cover capsules and not tablets.  Please resend the Rx for capsules.Patti Shorb Zimmerman Rumple, CMA

## 2019-03-26 ENCOUNTER — Ambulatory Visit (HOSPITAL_COMMUNITY)
Admission: RE | Admit: 2019-03-26 | Discharge: 2019-03-26 | Disposition: A | Discharge status: Home / Self Care | Payer: Medicaid Other | Source: Ambulatory Visit | Attending: Family Medicine | Admitting: Family Medicine

## 2019-03-26 ENCOUNTER — Other Ambulatory Visit: Payer: Self-pay | Admitting: Family Medicine

## 2019-03-26 ENCOUNTER — Other Ambulatory Visit: Payer: Self-pay

## 2019-03-26 DIAGNOSIS — I10 Essential (primary) hypertension: Secondary | ICD-10-CM | POA: Diagnosis not present

## 2019-03-26 DIAGNOSIS — R4589 Other symptoms and signs involving emotional state: Secondary | ICD-10-CM

## 2019-03-26 DIAGNOSIS — E118 Type 2 diabetes mellitus with unspecified complications: Secondary | ICD-10-CM | POA: Diagnosis not present

## 2019-03-26 DIAGNOSIS — R0602 Shortness of breath: Secondary | ICD-10-CM | POA: Diagnosis present

## 2019-03-26 DIAGNOSIS — R9431 Abnormal electrocardiogram [ECG] [EKG]: Secondary | ICD-10-CM | POA: Diagnosis not present

## 2019-03-26 DIAGNOSIS — F329 Major depressive disorder, single episode, unspecified: Secondary | ICD-10-CM

## 2019-03-26 DIAGNOSIS — R079 Chest pain, unspecified: Secondary | ICD-10-CM | POA: Insufficient documentation

## 2019-03-26 DIAGNOSIS — E785 Hyperlipidemia, unspecified: Secondary | ICD-10-CM | POA: Insufficient documentation

## 2019-03-26 DIAGNOSIS — R0600 Dyspnea, unspecified: Secondary | ICD-10-CM | POA: Diagnosis not present

## 2019-03-26 MED ORDER — FLUOXETINE HCL 20 MG PO CAPS: 20.0000 mg | ORAL_CAPSULE | Freq: Every day | ORAL | 3 refills | Status: DC

## 2019-03-26 NOTE — Progress Notes (Signed)
  Echocardiogram 2D Echocardiogram has been performed.  Bobbye Charleston 03/26/2019, 4:03 PM

## 2019-03-26 NOTE — Telephone Encounter (Signed)
Done

## 2019-03-27 DIAGNOSIS — R5383 Other fatigue: Secondary | ICD-10-CM | POA: Insufficient documentation

## 2019-03-27 DIAGNOSIS — F321 Major depressive disorder, single episode, moderate: Secondary | ICD-10-CM | POA: Insufficient documentation

## 2019-03-27 NOTE — Assessment & Plan Note (Signed)
Depression appears to be focused around her acquired role as the caregiver for her partner and the stress involved with having to care for him 24 hours a day.  Our social worker, Casimer Lanius, spoke with the patient and provided her with information for therapist as well as caregiver resources and support groups.  Patient was started on fluoxetine 20 mg daily as well.  Follow-up appointment scheduled with PCP.

## 2019-03-27 NOTE — Assessment & Plan Note (Signed)
Likely multifactorial with a major component being depression/stress.  There are some concerning aspects of her history including getting out of breath while walking to the bathroom, orthopnea, and complaints of leg swelling (although no leg swelling appreciated on physical exam). -BMP, CBC, TSH -Echocardiogram -Depression treatment as outlined below

## 2019-03-30 ENCOUNTER — Other Ambulatory Visit: Payer: Self-pay | Admitting: Family Medicine

## 2019-03-30 ENCOUNTER — Telehealth: Payer: Self-pay | Admitting: Family Medicine

## 2019-03-30 DIAGNOSIS — R5383 Other fatigue: Secondary | ICD-10-CM

## 2019-03-30 NOTE — Telephone Encounter (Signed)
Spoke with the patient regarding her recent lab/imaging for fatigue.  Echo normal, TSH and CBC also normal.  Pt willing to undergo sleep study for potential sleep apnea.

## 2019-04-10 ENCOUNTER — Encounter: Payer: Self-pay | Admitting: Orthopedic Surgery

## 2019-04-10 ENCOUNTER — Ambulatory Visit: Payer: Medicaid Other | Admitting: Licensed Clinical Social Worker

## 2019-04-10 ENCOUNTER — Ambulatory Visit (INDEPENDENT_AMBULATORY_CARE_PROVIDER_SITE_OTHER): Payer: Medicaid Other | Admitting: Orthopedic Surgery

## 2019-04-10 ENCOUNTER — Other Ambulatory Visit: Payer: Self-pay

## 2019-04-10 ENCOUNTER — Ambulatory Visit (INDEPENDENT_AMBULATORY_CARE_PROVIDER_SITE_OTHER): Payer: Medicaid Other

## 2019-04-10 VITALS — Ht 66.5 in | Wt 257.0 lb

## 2019-04-10 DIAGNOSIS — M25562 Pain in left knee: Secondary | ICD-10-CM

## 2019-04-10 DIAGNOSIS — Z636 Dependent relative needing care at home: Secondary | ICD-10-CM

## 2019-04-10 NOTE — Patient Instructions (Signed)
Licensed Clinical Social Worker Visit Information Brianna Hawkins  it was nice speaking with you. Please call me directly if you have questions [phone] Goals we discussed today: resources to support you as a caregiver Goals Addressed            This Visit's Progress   . mental health support       CARE PLAN ENTRY (see longitudinal plan of care for additional care plan information) Current Barriers:  . Patient with DMII, history of depression and caregiver stress Acknowledges deficits with connecting to mental health provider for ongoing counseling.  . Patient is experiencing symptoms of stress which seem to be exacerbated being a caregiver.     . Patient needs Support, Education, and Care Coordination in order to meet unmet mental health needs  . Lacks knowledge of community resource: to assist with caring for loved one . Patient has not been able to connect to the resources provided but will call Clinical Social Work Goal(s):  Marland Kitchen Over the next 30 days, patient will work with SW by telephone to reduce or manage symptoms of stress until connected for ongoing counseling resources.  . Patient will implement clinical interventions discussed today to decreases symptoms of stress and increase knowledge and/or ability of: coping skills and stress reduction. Interventions:  . Assessed patient's care coordination needs  . Reviewed community support options for loved one ( [facility], [employer], and CAP)  . Other interventions include:emotional support and Solution-Focused Strategies  Patient Self Care Activities & Deficits:  . Patient reports she is able to independently navigate community resource options without care coordination support . Patient is able to implement clinical interventions discussed today and is motivated for treatment  . Patient will select one of the agencies from the list provided and call to schedule an appointment  Initial goal documentation       Materials provided: Ms. Numbers received Care Management services today:  1. Care Management services include personalized support from designated clinical staff supervised by her physician, including individualized plan of care and coordination with other care providers 2. 24/7 contact [phone] for assistance for urgent and routine care needs. 3. Care Management are voluntary services and be declined at any time by calling the office.    Patient verbalizes understanding of instructions provided today.  Follow up plan: No follow up scheduled   Maurine Cane, LCSW

## 2019-04-10 NOTE — Progress Notes (Signed)
Office Visit Note   Patient: Brianna Hawkins           Date of Birth: 1960/07/17           MRN: [id number] Visit Date: 04/10/2019 Requested by: Cleophas Dunker, DO [address],  [place] [address] PCP: Cleophas Dunker, DO  Subjective: Chief Complaint  Patient presents with  . Left Knee - Pain    HPI: Brianna Hawkins is a patient with bilateral knee pain.  Right knee did well with an injection.  Left knee pain started 3 weeks ago when she stood up from his crouched position.  Reported pain and tearing in the back of her knee.  No medication for pain.  It has gotten somewhat better.  She is reluctant to pursue any intervention at this time.              ROS: All systems reviewed are negative as they relate to the chief complaint within the history of present illness.  Patient denies  fevers or chills.   Assessment & Plan: Visit Diagnoses:  1. Acute pain of left knee     Plan: Impression is left knee pain which may be a ruptured Baker's cyst based on point tenderness posteriorly versus meniscal root tear.  Not much effusion to confirm that diagnosis.  I think for now I would follow this clinically.  If her symptoms continue then injection would be indicated.  She states that she cannot take anti-inflammatories.  She will come back if she is not improving.  Injection indicated at that time.  Follow-Up Instructions: Return if symptoms worsen or fail to improve.   Orders:  Orders Placed This Encounter  Procedures  . XR KNEE 3 VIEW LEFT   No orders of the defined types were placed in this encounter.     Procedures: No procedures performed   Clinical Data: No additional findings.  Objective: Vital Signs: Ht 5' 6.5" (1.689 m)   Wt 257 lb (116.6 kg)   LMP 12/11/2013   BMI 40.86 kg/m   Physical Exam:   Constitutional: Patient appears well-developed HEENT:  Head: Normocephalic Eyes:EOM are normal Neck: Normal range of motion Cardiovascular: Normal  rate Pulmonary/chest: Effort normal Neurologic: Patient is alert Skin: Skin is warm Psychiatric: Patient has normal mood and affect    Ortho Exam: Ortho exam demonstrates full active and passive range of motion of the left knee with no effusion.  Collateral and cruciate ligaments are stable.  No masses lymphadenopathy or skin changes noted in that left knee region.  Not much effusion.  Extensor mechanism is intact.  No focal medial or lateral joint line tenderness.  Specialty Comments:  No specialty comments available.  Imaging: XR KNEE 3 VIEW LEFT  Result Date: 04/10/2019 AP lateral merchant left knee reviewed.  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  There is mild medial joint space narrowing bilaterally but no bone-on-bone changes.  Minimal osteophytes.  Alignment normal.    PMFS History: Patient Active Problem List   Diagnosis Date Noted  . Fatigue 03/27/2019  . Depression, major, single episode, moderate ([place]) 03/27/2019  . Venous insufficiency of both lower extremities 08/11/2018  . Screening for malignant neoplasm of cervix 08/11/2018  . Hypercholesterolemia 11/07/2017  . Coronary artery calcification seen on CT scan   . DM2 (diabetes mellitus, type 2) ([place])   . Plantar fasciitis of left foot 09/04/2016  . Right shoulder pain 03/03/2015  . Paresthesia 12/01/2014  . Chronic migraine  without aura without status migrainosus, not intractable 11/11/2014  . Morbid obesity ([place]) 11/11/2014  . Chronic sinusitis 07/06/2013  . Palpitations 02/18/2012  . BMI 34.0-34.9,adult 08/02/2011  . Chest pain 04/21/2011  . Abnormal CXR 04/21/2011  . Dyspnea 04/21/2011  . Hypertension 02/08/2011  . Vertigo, benign positional 02/08/2011  . Insomnia 02/08/2011  . Tuberculosis 01/02/1996   Past Medical History:  Diagnosis Date  . Allergy   . Arthritis   . Blood transfusion    with child birth  . Blood transfusion without reported diagnosis   . Coronary artery  calcification seen on CT scan    Coronary CTA 9/19: Ca score = 1 (80th percentile); calcified plaque distal LM; no obstructive CAD.  Marland Kitchen DM2 (diabetes mellitus, type 2) ([place])   . GERD (gastroesophageal reflux disease)   . Headache(784.0)    migraine HAs  . Heart murmur    very mild-echo 1/15-normal  . History of echocardiogram    Echo 9/19: EF 55-60, no RWMA, normal diastolic function, trace MR/TR  . HLD (hyperlipidemia)   . Hypertension   . Migraine   . Pain, joint, shoulder region, right   . Plantar fasciitis of left foot 09/04/2016  . Substance abuse ([place])   . 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  Marland Kitchen Urinary urgency   . Vertigo     Family History  Problem Relation Age of Onset  . Throat cancer Father        was a smoker  . Colon cancer Father   . Alcohol abuse Father   . Cancer Father 37       colon cancer  . HIV Sister   . Alcohol abuse Mother   . Diabetes Mother   . Heart disease Mother   . Cancer Mother   . Diabetes Paternal Grandmother   . Dementia Paternal Grandmother   . Hypertension Paternal Grandmother   . Breast cancer Paternal Grandmother   . Skin cancer Paternal Grandmother   . Diabetes Maternal Grandmother   . Heart disease Sister 34       died age 50 "Heart was weak"  . Stroke Sister   . Sudden Cardiac Death Neg Hx     Past Surgical History:  Procedure Laterality Date  . BREAST BIOPSY  2012   Underarm-rt-extra tissue  . COLONOSCOPY  age 29 yo   Screen  . ETHMOIDECTOMY Left 07/10/2013   Procedure: ETHMOIDECTOMY LEFT,MAXILLARY OSTIAL ENLARGEMENT WITH REMOVAL OF DISEASE;  Surgeon: Rozetta Nunnery, MD;  Location: [facility];  Service: ENT;  Laterality: Left;  . NOSE SURGERY  2015  . TUBAL LIGATION  1980s  . TURBINATE REDUCTION Left 07/10/2013   Procedure: LEFT TURBINATE REDUCTION;  Surgeon: Rozetta Nunnery, MD;  Location: [facility];  Service: ENT;  Laterality: Left;  . UPPER GI ENDOSCOPY     Social History    Occupational History  . Occupation: unemployed    Fish farm manager: [employer]    Comment: house keeping  Tobacco Use  . Smoking status: Former Smoker    Packs/day: 0.30    Years: 15.00    Pack years: 4.50    Types: Cigarettes    Quit date: 01/01/1998    Years since quitting: 21.2  . Smokeless tobacco: Never Used  . Tobacco comment: "never inhaled"  Substance and Sexual Activity  . Alcohol use: Yes    Alcohol/week: 1.0 standard drinks    Types: 1 Glasses of wine per week    Comment: 1-2x  per month   . Drug use: No    Comment: Last crack cocaine 1998  . Sexual activity: Not Currently    Birth control/protection: Surgical

## 2019-04-10 NOTE — Chronic Care Management (AMB) (Signed)
Care Management   Clinical Social Work Follow Up   04/10/2019 Name: Brianna Hawkins MRN: [id number] DOB: 1960-06-17  Referred by: Cleophas Dunker, DO  Reason for referral : Care Coordination (F/U call) Brianna Hawkins is a 59 y.o. year old female who is a primary care patient of [place], Bernita Raisin, DO.   Reason for follow-up: Phone encounter with patient today for ongoing assessment and brief interventions to assist with care coordination needs for caregiver stress.   Assessment:Patient has not make progress towards goal.  States she still has all information and will make the contacts. Patient denies needing ongoing support from LCSW. SDOH (Social Determinants of Health) assessments performed: No  Review of patient status, including review of consultants reports, relevant laboratory and other test results, and collaboration with appropriate care team members and the patient's provider was performed as part of comprehensive patient evaluation and provision of care management services.     Goals Addressed            This Visit's Progress   . mental health support       CARE PLAN ENTRY (see longitudinal plan of care for additional care plan information) Current Barriers:  . Patient with DMII, history of depression and caregiver stress Acknowledges deficits with connecting to mental health provider for ongoing counseling.  . Patient is experiencing symptoms of stress which seem to be exacerbated being a caregiver.     . Patient needs Support, Education, and Care Coordination in order to meet unmet mental health needs  . Lacks knowledge of community resource: to assist with caring for loved one . Patient has not been able to connect to the resources provided but will call Clinical Social Work Goal(s):  Marland Kitchen Over the next 30 days, patient will work with SW by telephone to reduce or manage symptoms of stress until connected for ongoing counseling resources.  . Patient will implement  clinical interventions discussed today to decreases symptoms of stress and increase knowledge and/or ability of: coping skills and stress reduction. Interventions:  . Assessed patient's care coordination needs  . Reviewed community support options for loved one ( PACE, [employer], and CAP)  . Other interventions include:emotional support and Solution-Focused Strategies  Patient Self Care Activities & Deficits:  . Patient reports she is able to independently navigate community resource options without care coordination support . Patient is able to implement clinical interventions discussed today and is motivated for treatment  . Patient will select one of the agencies from the list provided and call to schedule an appointment  Initial goal documentation        Outpatient Encounter Medications as of 04/10/2019  Medication Sig  . AGAMATRIX ULTRA-THIN LANCETS MISC Check sugars twice daily before meals  . albuterol (VENTOLIN HFA) 108 (90 Base) MCG/ACT inhaler Inhale 2 puffs into the lungs every 6 (six) hours as needed for wheezing or shortness of breath.  Marland Kitchen amLODipine (NORVASC) 10 MG tablet Take 1 tablet (10 mg total) by mouth daily.  Marland Kitchen amoxicillin-clavulanate (AUGMENTIN) 875-125 MG tablet Take 1 tablet by mouth 2 (two) times daily.  Marland Kitchen aspirin EC 81 MG tablet Take 1 tablet (81 mg total) by mouth daily.  Marland Kitchen atorvastatin (LIPITOR) 40 MG tablet Take 1 tablet (40 mg total) by mouth daily.  . Blood Glucose Monitoring Suppl (AGAMATRIX PRESTO) w/Device KIT Check sugars twice daily before meals  . Cholecalciferol (VITAMIN D3) 50 MCG (2000 UT) TABS Take by mouth daily.  Marland Kitchen esomeprazole ([place])  40 MG capsule TAKE 1 CAPSULE (40 MG TOTAL) BY MOUTH DAILY.  Marland Kitchen FLUoxetine (PROZAC) 20 MG capsule Take 1 capsule (20 mg total) by mouth daily.  . fluticasone (FLONASE) 50 MCG/ACT nasal spray Place 2 sprays into both nostrils 2 (two) times daily.  Marland Kitchen glucose blood (AGAMATRIX PRESTO TEST) test strip  Check blood glucose twice daily before meals  . hydrochlorothiazide (HYDRODIURIL) 25 MG tablet TAKE 1 TABLET BY MOUTH ONCE DAILY  . metFORMIN (GLUCOPHAGE-XR) 500 MG 24 hr tablet 1 tab by mouth twice daily with meals  . Multiple Vitamins-Minerals (HAIR/SKIN/NAILS) TABS Take 1 tablet by mouth daily.  . nitroGLYCERIN (NITROSTAT) 0.4 MG SL tablet 1 tab under tongue for chest pressure, may repeat every 5 minutes if not relieved for total of 3 doses.  . rizatriptan (MAXALT) 10 MG tablet Take 10 mg by mouth as needed for migraine. May repeat in 2 hours if needed   No facility-administered encounter medications on file as of 04/10/2019.   Plan:  1. No follow up scheduled with LCSW 2. Patient will contact LSCW if needed  Casimer Lanius, [place] / [place]   [phone] 10:23 AM

## 2019-04-16 ENCOUNTER — Other Ambulatory Visit: Payer: Self-pay

## 2019-04-16 ENCOUNTER — Ambulatory Visit (INDEPENDENT_AMBULATORY_CARE_PROVIDER_SITE_OTHER): Payer: Medicaid Other | Admitting: Neurology

## 2019-04-16 ENCOUNTER — Encounter: Payer: Self-pay | Admitting: Neurology

## 2019-04-16 VITALS — BP 121/79 | HR 80 | Ht 66.0 in | Wt 259.0 lb

## 2019-04-16 DIAGNOSIS — R0683 Snoring: Secondary | ICD-10-CM

## 2019-04-16 DIAGNOSIS — R351 Nocturia: Secondary | ICD-10-CM | POA: Diagnosis not present

## 2019-04-16 DIAGNOSIS — G4719 Other hypersomnia: Secondary | ICD-10-CM | POA: Diagnosis not present

## 2019-04-16 DIAGNOSIS — Z6841 Body Mass Index (BMI) 40.0 and over, adult: Secondary | ICD-10-CM

## 2019-04-16 NOTE — Patient Instructions (Signed)

## 2019-04-16 NOTE — Progress Notes (Signed)
Subjective:    Patient ID: Brianna Hawkins is a 59 y.o. female.  HPI     Star Age, MD, PhD [facility] [address], [address] [address] [place], [place] [address]  Dear Dr. Jeannine Kitten,   I saw your patient, Brianna Hawkins, upon your kind request in my sleep clinic today for initial consultation of her sleep disorder, in particular, concern for underlying obstructive sleep apnea.  The patient is unaccompanied today.  As you know, [name] is a 59 year old right-handed woman with an underlying medical history of diabetes, hypertension, hyperlipidemia, arthritis, allergies, reflux disease, depression, and morbid obesity with a BMI of over 40, who reports snoring and excessive daytime somnolence.  I reviewed your office note from 03/24/2019.  Her Epworth sleepiness score is 4 out of 24, fatigue severity score is 38 out of 63.  She reports that her sleep is disrupted because of her husband's restless sleep and noisy breathing at night.  She reports that he is chronically ill and she has been his caretaker for years.  She gets up to use the bathroom multiple times a night, an average of 5 times.  She has no family history of sleep apnea.  She lives with her husband and 42 year old Psychiatrist.  She has 4 biological sons and 6 grandchildren.  She quit smoking some 18 years ago, drinks alcohol rarely, caffeine in the form of coffee, 1 cup/day on average.  She is trying to lose weight.  She is generally in bed late, tries to wait until her husband is asleep.  She may be in bed between 2 and 3 AM, she then wakes up at 730 to help him get ready and then lays back down and may sleep till 9 or 9:30 AM.  Her Past Medical History Is Significant For: Past Medical History:  Diagnosis Date  . Allergy   . Arthritis   . Blood transfusion    with child birth  . Blood transfusion without reported diagnosis   . Coronary artery calcification seen on CT scan    Coronary CTA 9/19: Ca score  = 1 (80th percentile); calcified plaque distal LM; no obstructive CAD.  Marland Kitchen DM2 (diabetes mellitus, type 2) ([place])   . GERD (gastroesophageal reflux disease)   . Headache(784.0)    migraine HAs  . Heart murmur    very mild-echo 1/15-normal  . History of echocardiogram    Echo 9/19: EF 55-60, no RWMA, normal diastolic function, trace MR/TR  . HLD (hyperlipidemia)   . Hypertension   . Migraine   . Pain, joint, shoulder region, right   . Plantar fasciitis of left foot 09/04/2016  . Substance abuse ([place])   . 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  Marland Kitchen Urinary urgency   . Vertigo     Her Past Surgical History Is Significant For: Past Surgical History:  Procedure Laterality Date  . BREAST BIOPSY  2012   Underarm-rt-extra tissue  . COLONOSCOPY  age 23 yo   Screen  . ETHMOIDECTOMY Left 07/10/2013   Procedure: ETHMOIDECTOMY LEFT,MAXILLARY OSTIAL ENLARGEMENT WITH REMOVAL OF DISEASE;  Surgeon: Rozetta Nunnery, MD;  Location: [place];  Service: ENT;  Laterality: Left;  . NOSE SURGERY  2015  . TUBAL LIGATION  1980s  . TURBINATE REDUCTION Left 07/10/2013   Procedure: LEFT TURBINATE REDUCTION;  Surgeon: Rozetta Nunnery, MD;  Location: [place];  Service: ENT;  Laterality: Left;  . UPPER GI ENDOSCOPY  Her Family History Is Significant For: Family History  Problem Relation Age of Onset  . Throat cancer Father        was a smoker  . Colon cancer Father   . Alcohol abuse Father   . Cancer Father 64       colon cancer  . HIV Sister   . Alcohol abuse Mother   . Diabetes Mother   . Heart disease Mother   . Cancer Mother   . Diabetes Paternal Grandmother   . Dementia Paternal Grandmother   . Hypertension Paternal Grandmother   . Breast cancer Paternal Grandmother   . Skin cancer Paternal Grandmother   . Diabetes Maternal Grandmother   . Heart disease Sister 48       died age 3 "Heart was weak"  . Stroke Sister   . Sudden Cardiac Death Neg Hx      Her Social History Is Significant For: Social History   Socioeconomic History  . Marital status: Single    Spouse name: Not on file  . Number of children: 4  . Years of education: 84  . Highest education level: Not on file  Occupational History  . Occupation: unemployed    Fish farm manager: [employer]    Comment: house keeping  Tobacco Use  . Smoking status: Former Smoker    Packs/day: 0.30    Years: 15.00    Pack years: 4.50    Types: Cigarettes    Quit date: 01/01/1998    Years since quitting: 21.3  . Smokeless tobacco: Never Used  . Tobacco comment: "never inhaled"  Substance and Sexual Activity  . Alcohol use: Yes    Alcohol/week: 1.0 standard drinks    Types: 1 Glasses of wine per week    Comment: 1-2x per month   . Drug use: No    Comment: Last crack cocaine 1998  . Sexual activity: Not Currently    Birth control/protection: Surgical  Other Topics Concern  . Not on file  Social History Narrative   Single. Education: [employer].   Right-handed.   1-2 cups caffeine per day.   Lives with an elderly gentleman she cares for and her 57 yo Psychiatrist.   Social Determinants of Health   Financial Resource Strain:   . Difficulty of Paying Living Expenses:   Food Insecurity:   . Worried About Charity fundraiser in the Last Year:   . Arboriculturist in the Last Year:   Transportation Needs:   . Film/video editor (Medical):   Marland Kitchen Lack of Transportation (Non-Medical):   Physical Activity: Inactive  . Days of Exercise per Week: 0 days  . Minutes of Exercise per Session: 0 min  Stress:   . Feeling of Stress :   Social Connections:   . Frequency of Communication with Friends and Family:   . Frequency of Social Gatherings with Friends and Family:   . Attends Religious Services:   . Active Member of Clubs or Organizations:   . Attends Archivist Meetings:   Marland Kitchen Marital Status:     Her Allergies Are:  No Known Allergies:   Her Current Medications Are:   Outpatient Encounter Medications as of 04/16/2019  Medication Sig  . AGAMATRIX ULTRA-THIN LANCETS MISC Check sugars twice daily before meals  . albuterol (VENTOLIN HFA) 108 (90 Base) MCG/ACT inhaler Inhale 2 puffs into the lungs every 6 (six) hours as needed for wheezing or shortness of breath.  Marland Kitchen amLODipine (NORVASC) 10  MG tablet Take 1 tablet (10 mg total) by mouth daily.  Marland Kitchen amoxicillin-clavulanate (AUGMENTIN) 875-125 MG tablet Take 1 tablet by mouth 2 (two) times daily.  Marland Kitchen aspirin EC 81 MG tablet Take 1 tablet (81 mg total) by mouth daily.  Marland Kitchen atorvastatin (LIPITOR) 40 MG tablet Take 1 tablet (40 mg total) by mouth daily.  . Blood Glucose Monitoring Suppl (AGAMATRIX PRESTO) w/Device KIT Check sugars twice daily before meals  . Cholecalciferol (VITAMIN D3) 50 MCG (2000 UT) TABS Take by mouth daily.  Marland Kitchen esomeprazole (NEXIUM) 40 MG capsule TAKE 1 CAPSULE (40 MG TOTAL) BY MOUTH DAILY.  Marland Kitchen FLUoxetine (PROZAC) 20 MG capsule Take 1 capsule (20 mg total) by mouth daily.  . fluticasone (FLONASE) 50 MCG/ACT nasal spray Place 2 sprays into both nostrils 2 (two) times daily.  Marland Kitchen glucose blood (AGAMATRIX PRESTO TEST) test strip Check blood glucose twice daily before meals  . hydrochlorothiazide (HYDRODIURIL) 25 MG tablet TAKE 1 TABLET BY MOUTH ONCE DAILY  . metFORMIN (GLUCOPHAGE-XR) 500 MG 24 hr tablet 1 tab by mouth twice daily with meals  . Multiple Vitamins-Minerals (HAIR/SKIN/NAILS) TABS Take 1 tablet by mouth daily.  . nitroGLYCERIN (NITROSTAT) 0.4 MG SL tablet 1 tab under tongue for chest pressure, may repeat every 5 minutes if not relieved for total of 3 doses.  . rizatriptan (MAXALT) 10 MG tablet Take 10 mg by mouth as needed for migraine. May repeat in 2 hours if needed   No facility-administered encounter medications on file as of 04/16/2019.  :  Review of Systems:  Out of a complete 14 point review of systems, all are reviewed and negative with the exception of these symptoms as listed  below: Review of Systems  Neurological:       Pt presents today to discuss her sleep. Pt has never had a sleep study but does endorse snoring.  Epworth Sleepiness Scale 0= would never doze 1= slight chance of dozing 2= moderate chance of dozing 3= high chance of dozing  Sitting and reading: 0 Watching TV: 2 Sitting inactive in a public place (ex. Theater or meeting): 0 As a passenger in a car for an hour without a break: 0 Lying down to rest in the afternoon: 1 Sitting and talking to someone: 0 Sitting quietly after lunch (no alcohol): 1 In a car, while stopped in traffic: 0 Total: 4     Objective:  Neurological Exam  Physical Exam Physical Examination:   Vitals:   04/16/19 1316  BP: 121/79  Pulse: 80    General Examination: The patient is a very pleasant 59 y.o. female in no acute distress. She appears well-developed and well-nourished and well groomed.   HEENT: Normocephalic, atraumatic, pupils are equal, round and reactive to light, extraocular tracking is good without limitation to gaze excursion or nystagmus noted. Hearing is grossly intact. Face is symmetric with normal facial animation. Speech is clear with no dysarthria noted. There is no hypophonia. There is no lip, neck/head, jaw or voice tremor. Neck is supple with full range of passive and active motion. There are no carotid bruits on auscultation. Oropharynx exam reveals: mild mouth dryness, marginal dental hygiene and few teeth left, has partial dentures but does not wear them. Mod. airway crowding, due to tonsillar size of 1-2+, wider tongue, small airway entry, Mallampati class II.  Tongue protrudes centrally in palate elevates symmetrically.  Neck circumference is 16 inches.  Chest: Clear to auscultation without wheezing, rhonchi or crackles noted.  Heart: S1+S2+0, regular and  normal without murmurs, rubs or gallops noted.   Abdomen: Soft, non-tender and non-distended with normal bowel sounds appreciated on  auscultation.  Extremities: There is no pitting edema in the distal lower extremities bilaterally.   Skin: Warm and dry without trophic changes noted.   Musculoskeletal: exam reveals no obvious joint deformities, tenderness or joint swelling or erythema.   Neurologically:  Mental status: The patient is awake, alert and oriented in all 4 spheres. Her immediate and remote memory, attention, language skills and fund of knowledge are appropriate. There is no evidence of aphasia, agnosia, apraxia or anomia. Speech is clear with normal prosody and enunciation. Thought process is linear. Mood is normal and affect is normal.  Cranial nerves II - XII are as described above under HEENT exam.  Motor exam: Normal bulk, strength and tone is noted. There is no tremor, Romberg is negative. Fine motor skills and coordination: grossly intact.  Cerebellar testing: No dysmetria or intention tremor. There is no truncal or gait ataxia.  Sensory exam: intact to light touch in the upper and lower extremities.  Gait, station and balance: She stands easily. No veering to one side is noted. No leaning to one side is noted. Posture is age-appropriate and stance is narrow based. Gait shows normal stride length and normal pace. No problems turning are noted. Tandem walk is unremarkable.               Assessment and Plan:  In summary, SHATONYA PASSON is a very pleasant 59 y.o.-year old female with an underlying medical history of diabetes, hypertension, hyperlipidemia, arthritis, allergies, reflux disease, depression, and morbid obesity with a BMI of over 40, whose history and physical exam are concerning for obstructive sleep apnea (OSA). I had a long chat with the patient about my findings and the diagnosis of OSA, its prognosis and treatment options. We talked about medical treatments, surgical interventions and non-pharmacological approaches. I explained in particular the risks and ramifications of untreated moderate to  severe OSA, especially with respect to developing cardiovascular disease down the Road, including congestive heart failure, difficult to treat hypertension, cardiac arrhythmias, or stroke. Even type 2 diabetes has, in part, been linked to untreated OSA. Symptoms of untreated OSA include daytime sleepiness, memory problems, mood irritability and mood disorder such as depression and anxiety, lack of energy, as well as recurrent headaches, especially morning headaches. We talked about trying to maintain a healthy lifestyle in general, as well as the importance of weight control. We also talked about the importance of good sleep hygiene. I recommended the following at this time: sleep study.   I explained the sleep test procedure to the patient and also outlined possible surgical and non-surgical treatment options of OSA. I explained the CPAP treatment option to the patient, who indicated that she would be willing to try CPAP if the need arises. I explained the importance of being compliant with PAP treatment, not only for insurance purposes but primarily to improve Her symptoms, and for the patient's long term health benefit, including to reduce Her cardiovascular risks. I answered all her questions today and the patient was in agreement. I plan to see her back after the sleep study is completed and encouraged her to call with any interim questions, concerns, problems or updates.   Thank you very much for allowing me to participate in the care of this nice patient. If I can be of any further assistance to you please do not hesitate to call me at [phone].  Sincerely,   Star Age, MD, PhD

## 2019-04-30 ENCOUNTER — Ambulatory Visit: Payer: Medicaid Other | Admitting: Family Medicine

## 2019-05-03 ENCOUNTER — Ambulatory Visit (INDEPENDENT_AMBULATORY_CARE_PROVIDER_SITE_OTHER): Payer: Medicaid Other | Admitting: Neurology

## 2019-05-03 DIAGNOSIS — R0683 Snoring: Secondary | ICD-10-CM

## 2019-05-03 DIAGNOSIS — Z6841 Body Mass Index (BMI) 40.0 and over, adult: Secondary | ICD-10-CM

## 2019-05-03 DIAGNOSIS — G4733 Obstructive sleep apnea (adult) (pediatric): Secondary | ICD-10-CM

## 2019-05-03 DIAGNOSIS — G4719 Other hypersomnia: Secondary | ICD-10-CM

## 2019-05-03 DIAGNOSIS — R351 Nocturia: Secondary | ICD-10-CM

## 2019-05-18 ENCOUNTER — Telehealth: Payer: Self-pay

## 2019-05-18 NOTE — Procedures (Signed)
PATIENT'S NAME:  Brianna Hawkins, Sessom DOB:      08-Aug-1960      MR#:    [id number]     DATE OF RECORDING: 05/03/2019 REFERRING M.D.:  Addison Naegeli MD Study Performed:   Baseline Polysomnogram HISTORY: 59 year old woman with a history of diabetes, hypertension, hyperlipidemia, arthritis, allergies, reflux disease, depression, and morbid obesity with a BMI of over 40, who reports snoring and excessive daytime somnolence. The patient endorsed the Epworth Sleepiness Scale at 4/24 points. The patient's weight 259 pounds with a height of 66 (inches), resulting in a BMI of 41.5 kg/m2. The patient's neck circumference measured 16 inches.  CURRENT MEDICATIONS: Ventolin, Norvasc, Augmentin, Lipitor, Vitamin D3, Nexium, Prozac, Flonase, Hydrodiuril, Glucophage, Nitrostat, Maxalt.   PROCEDURE:  This is a multichannel digital polysomnogram utilizing the Somnostar 11.2 system.  Electrodes and sensors were applied and monitored per AASM Specifications.   EEG, EOG, Chin and Limb EMG, were sampled at 200 Hz.  ECG, Snore and Nasal Pressure, Thermal Airflow, Respiratory Effort, CPAP Flow and Pressure, Oximetry was sampled at 50 Hz. Digital video and audio were recorded.      BASELINE STUDY  Lights Out was at 20:53 and Lights On at 04:56.  Total recording time (TRT) was 483 minutes, with a total sleep time (TST) of 448 minutes.   The patient's sleep latency was 24 minutes.  REM latency was 80.5 minutes, which is normal.  The sleep efficiency was 92.8 %.     SLEEP ARCHITECTURE: WASO (Wake after sleep onset) was 18.5 minutes with minimal to mild sleep fragmentation noted. There were 7 minutes in Stage N1, 253 minutes Stage N2, 101.5 minutes Stage N3 and 86.5 minutes in Stage REM.  The percentage of Stage N1 was 1.6%, Stage N2 was 56.5%, Stage N3 was 22.7% and Stage R (REM sleep) was 19.3%. The arousals were noted as: 38 were spontaneous, 3 were associated with PLMs, 11 were associated with respiratory events.  RESPIRATORY  ANALYSIS:  There were a total of 57 respiratory events:  8 obstructive apneas, 2 central apneas and 0 mixed apneas with a total of 10 apneas and an apnea index (AI) of 1.3 /hour. There were 47 hypopneas with a hypopnea index of 6.3 /hour. The patient also had 0 respiratory event related arousals (RERAs).      The total APNEA/HYPOPNEA INDEX (AHI) was 7.6/hour and the total RESPIRATORY DISTURBANCE INDEX was  7.6 /hour.  25 events occurred in REM sleep and 58 events in NREM. The REM AHI was  17.3 /hour, versus a non-REM AHI of 5.3. The patient spent 164 minutes of total sleep time in the supine position and 284 minutes in non-supine.. The supine AHI was 12.5 versus a non-supine AHI of 4.9.  OXYGEN SATURATION & C02:  The Wake baseline 02 saturation was 94%, with the lowest being 80%. Time spent below 89% saturation equaled 11 minutes.  PERIODIC LIMB MOVEMENTS: The patient had a total of 10 Periodic Limb Movements.  The Periodic Limb Movement (PLM) index was 1.3 and the PLM Arousal index was .4/hour.  Audio and video analysis did not show any abnormal or unusual movements, behaviors, phonations or vocalizations. The patient took no bathroom breaks. Mild intermittent snoring was noted. The EKG was in keeping with normal sinus rhythm (NSR).  Post-study, the patient indicated that sleep was better than usual.   IMPRESSION:  1. Obstructive Sleep Apnea (OSA)  RECOMMENDATIONS:  1. This study demonstrates overall mild obstructive sleep apnea, moderate during REM sleep with  a total AHI of 7.6/hour, REM AHI of 17.3/hour, and O2 nadir of 80%. Given the patient's medical history and sleep related complaints, treatment with positive airway pressure is recommended; this can be achieved in the form of autoPAP. Alternatively, a full-night CPAP titration study would allow optimization of therapy if needed. Other treatment options may include avoidance of supine sleep position along with weight loss, upper airway or jaw  surgery in selected patients or the use of an oral appliance in certain patients. ENT evaluation and/or consultation with a maxillofacial surgeon or dentist may be feasible in some instances.    2. Please note that untreated obstructive sleep apnea may carry additional perioperative morbidity. Patients with significant obstructive sleep apnea should receive perioperative PAP therapy and the surgeons and particularly the anesthesiologist should be informed of the diagnosis and the severity of the sleep disordered breathing. 3. The patient should be cautioned not to drive, work at heights, or operate dangerous or heavy equipment when tired or sleepy. Review and reiteration of good sleep hygiene measures should be pursued with any patient. 4. The patient will be seen in follow-up by Dr. Rexene Alberts at [facility] for discussion of the test results and further management strategies. The referring provider will be notified of the test results.  I certify that I have reviewed the entire raw data recording prior to the issuance of this report in accordance with the Standards of Accreditation of the American Academy of Sleep Medicine (AASM)   Star Age, MD, PhD Diplomat, American Board of Neurology and Sleep Medicine (Neurology and Sleep Medicine)

## 2019-05-18 NOTE — Telephone Encounter (Signed)
I called pt. I advised pt that Dr. Rexene Alberts reviewed their sleep study results and found that pt has mild to moderate osa. Dr. Rexene Alberts recommends that pt start an auto pap at home. I reviewed PAP compliance expectations with the pt. Pt is agreeable to starting an auto-PAP. I advised pt that an order will be sent to a DME, [facility], and [facility] will call the pt within about one week after they file with the pt's insurance. [facility] will show the pt how to use the machine, fit for masks, and troubleshoot the auto-PAP if needed. A follow up appt was made for insurance purposes with Amy, NP on 08/19/19 at 2:30pm. Pt verbalized understanding to arrive 15 minutes early and bring their auto-PAP. A letter with all of this information in it will be mailed to the pt as a reminder. I verified with the pt that the address we have on file is correct. Pt verbalized understanding of results. Pt had no questions at this time but was encouraged to call back if questions arise. I have sent the order to [facility] and have received confirmation that they have received the order.

## 2019-05-18 NOTE — Telephone Encounter (Signed)
-----   Message from Star Age, MD sent at 05/18/2019 10:34 AM EDT ----- Patient referred by Dr. Jeannine Kitten, seen by me on 04/16/19, diagnostic PSG on 05/03/19.   Please call and notify the patient that the recent sleep study showed mild to moderate obstructive sleep apnea and worth treating to see if she feels better after treatment. To that end I recommend treatment for this in the form of autoPAP, which means, that we don't have to bring her back for a second sleep study with CPAP, but will let her try an autoPAP machine at home, through a DME company (of her choice, or as per insurance requirement). The DME representative will educate her on how to use the machine, how to put the mask on, etc. I have placed an order in the chart. Please send referral, talk to patient, send report to referring MD. We will need a FU in sleep clinic for 10 weeks post-PAP set up, please arrange that with me or one of our NPs. Thanks,   Star Age, MD, PhD [facility])

## 2019-05-18 NOTE — Addendum Note (Signed)
Addended by: Star Age on: 05/18/2019 10:34 AM   Modules accepted: Orders

## 2019-05-18 NOTE — Progress Notes (Signed)
Patient referred by Dr. Jeannine Kitten, seen by me on 04/16/19, diagnostic PSG on 05/03/19.   Please call and notify the patient that the recent sleep study showed mild to moderate obstructive sleep apnea and worth treating to see if she feels better after treatment. To that end I recommend treatment for this in the form of autoPAP, which means, that we don't have to bring her back for a second sleep study with CPAP, but will let her try an autoPAP machine at home, through a DME company (of her choice, or as per insurance requirement). The DME representative will educate her on how to use the machine, how to put the mask on, etc. I have placed an order in the chart. Please send referral, talk to patient, send report to referring MD. We will need a FU in sleep clinic for 10 weeks post-PAP set up, please arrange that with me or one of our NPs. Thanks,   Star Age, MD, PhD [facility])

## 2019-05-25 ENCOUNTER — Other Ambulatory Visit: Payer: Self-pay

## 2019-05-25 MED ORDER — HYDROCHLOROTHIAZIDE 25 MG PO TABS: 25.0000 mg | ORAL_TABLET | Freq: Every day | ORAL | 0 refills | Status: DC

## 2019-06-17 ENCOUNTER — Other Ambulatory Visit: Payer: Self-pay | Admitting: *Deleted

## 2019-06-17 DIAGNOSIS — I10 Essential (primary) hypertension: Secondary | ICD-10-CM

## 2019-06-17 MED ORDER — AMLODIPINE BESYLATE 10 MG PO TABS: 10.0000 mg | ORAL_TABLET | Freq: Every day | ORAL | 0 refills | Status: DC

## 2019-08-11 ENCOUNTER — Other Ambulatory Visit: Payer: Self-pay | Admitting: Family Medicine

## 2019-08-11 DIAGNOSIS — I10 Essential (primary) hypertension: Secondary | ICD-10-CM

## 2019-08-19 ENCOUNTER — Ambulatory Visit: Payer: Medicaid Other | Admitting: Family Medicine

## 2019-08-19 ENCOUNTER — Other Ambulatory Visit: Payer: Self-pay

## 2019-08-19 ENCOUNTER — Encounter: Payer: Self-pay | Admitting: Family Medicine

## 2019-08-19 VITALS — BP 119/73 | HR 74 | Ht 66.0 in | Wt 257.0 lb

## 2019-08-19 DIAGNOSIS — Z9989 Dependence on other enabling machines and devices: Secondary | ICD-10-CM | POA: Diagnosis not present

## 2019-08-19 DIAGNOSIS — G4733 Obstructive sleep apnea (adult) (pediatric): Secondary | ICD-10-CM | POA: Diagnosis not present

## 2019-08-19 NOTE — Progress Notes (Addendum)
PATIENT: Brianna Hawkins DOB: 1960-05-23  REASON FOR VISIT: follow up HISTORY FROM: patient  Chief Complaint  Patient presents with  . Follow-up    F/U on cpap. States she has been doing well. She will occasionally wake up with headaches after using machine.   . room 1    alone     HISTORY OF PRESENT ILLNESS: Today 08/19/19 Brianna Hawkins is a 59 y.o. female here today for follow up for recently diagnosed OSA started on CPAP. Sleep study on 05/03/2019 "demonstrates overall mild obstructive sleep apnea,  moderate during REM sleep with a total AHI of 7.6/hour, REM AHI of 17.3/hour, and O2 nadir of 80%. She reports feeling significantly better since starting CPAP. She is sleeping much better. She feels moe energized. She has had an occasional headache in the mornings but not sure it is correlated. She reports that her husband will wake her up at night if he is unable to sleep. She is pleased with CPAP therapy.   Compliance report dated 07/19/2019 through 08/17/2019 reveals that she used CPAP therapy 30 of the past 30 days for compliance of 100%.  She is CPAP greater than 4 hours all 30 days.  Average usage was 6 hours and 18 minutes.  Residual AHI was 1.4 on 5 to 11 cm of water and an EPR of 3.  There was no significant leak noted.   HISTORY: (copied from Dr Guadelupe Sabin note on 04/16/2019)  ear Dr. Jeannine Kitten,   I saw your patient, Brianna Hawkins, upon your kind request in my sleep clinic today for initial consultation of her sleep disorder, in particular, concern for underlying obstructive sleep apnea.  The patient is unaccompanied today.  As you know, [name] is a 59 year old right-handed woman with an underlying medical history of diabetes, hypertension, hyperlipidemia, arthritis, allergies, reflux disease, depression, and morbid obesity with a BMI of over 40, who reports snoring and excessive daytime somnolence.  I reviewed your office note from 03/24/2019.  Her Epworth sleepiness score is  4 out of 24, fatigue severity score is 38 out of 63.  She reports that her sleep is disrupted because of her husband's restless sleep and noisy breathing at night.  She reports that he is chronically ill and she has been his caretaker for years.  She gets up to use the bathroom multiple times a night, an average of 5 times.  She has no family history of sleep apnea.  She lives with her husband and 63 year old Psychiatrist.  She has 4 biological sons and 6 grandchildren.  She quit smoking some 18 years ago, drinks alcohol rarely, caffeine in the form of coffee, 1 cup/day on average.  She is trying to lose weight.  She is generally in bed late, tries to wait until her husband is asleep.  She may be in bed between 2 and 3 AM, she then wakes up at 730 to help him get ready and then lays back down and may sleep till 9 or 9:30 AM.   REVIEW OF SYSTEMS: Out of a complete 14 system review of symptoms, the patient complains only of the following symptoms, none and all other reviewed systems are negative.   ALLERGIES: No Known Allergies  HOME MEDICATIONS: Outpatient Medications Prior to Visit  Medication Sig Dispense Refill  . AGAMATRIX ULTRA-THIN LANCETS MISC Check sugars twice daily before meals 100 each 11  . albuterol (VENTOLIN HFA) 108 (90 Base) MCG/ACT inhaler Inhale 2 puffs into the lungs every 6 (  six) hours as needed for wheezing or shortness of breath. 8 g 0  . amLODipine (NORVASC) 10 MG tablet Take 1 tablet by mouth once daily 90 tablet 0  . aspirin EC 81 MG tablet Take 1 tablet (81 mg total) by mouth daily.    . Blood Glucose Monitoring Suppl (AGAMATRIX PRESTO) w/Device KIT Check sugars twice daily before meals 1 kit 0  . Cholecalciferol (VITAMIN D3) 50 MCG (2000 UT) TABS Take by mouth daily.    Marland Kitchen esomeprazole (NEXIUM) 40 MG capsule TAKE 1 CAPSULE (40 MG TOTAL) BY MOUTH DAILY. 30 capsule 9  . FLUoxetine (PROZAC) 20 MG capsule Take 1 capsule (20 mg total) by mouth daily. 30 capsule 3  .  fluticasone (FLONASE) 50 MCG/ACT nasal spray Place 2 sprays into both nostrils 2 (two) times daily. 16 g 11  . glucose blood (AGAMATRIX PRESTO TEST) test strip Check blood glucose twice daily before meals 100 each 11  . hydrochlorothiazide (HYDRODIURIL) 25 MG tablet Take 1 tablet by mouth daily 90 tablet 0  . metFORMIN (GLUCOPHAGE-XR) 500 MG 24 hr tablet 1 tab by mouth twice daily with meals 60 tablet 11  . Multiple Vitamins-Minerals (HAIR/SKIN/NAILS) TABS Take 1 tablet by mouth daily.    . nitroGLYCERIN (NITROSTAT) 0.4 MG SL tablet 1 tab under tongue for chest pressure, may repeat every 5 minutes if not relieved for total of 3 doses. 24 tablet 3  . rizatriptan (MAXALT) 10 MG tablet Take 10 mg by mouth as needed for migraine. May repeat in 2 hours if needed    . atorvastatin (LIPITOR) 40 MG tablet Take 1 tablet (40 mg total) by mouth daily. 90 tablet 3  . amoxicillin-clavulanate (AUGMENTIN) 875-125 MG tablet Take 1 tablet by mouth 2 (two) times daily. 20 tablet 0   No facility-administered medications prior to visit.    PAST MEDICAL HISTORY: Past Medical History:  Diagnosis Date  . Allergy   . Arthritis   . Blood transfusion    with child birth  . Blood transfusion without reported diagnosis   . Coronary artery calcification seen on CT scan    Coronary CTA 9/19: Ca score = 1 (80th percentile); calcified plaque distal LM; no obstructive CAD.  Marland Kitchen DM2 (diabetes mellitus, type 2) ([place])   . GERD (gastroesophageal reflux disease)   . Headache(784.0)    migraine HAs  . Heart murmur    very mild-echo 1/15-normal  . History of echocardiogram    Echo 9/19: EF 55-60, no RWMA, normal diastolic function, trace MR/TR  . HLD (hyperlipidemia)   . Hypertension   . Migraine   . Pain, joint, shoulder region, right   . Plantar fasciitis of left foot 09/04/2016  . Substance abuse ([place])   . 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  Marland Kitchen Urinary urgency   . Vertigo     PAST SURGICAL HISTORY: Past Surgical  History:  Procedure Laterality Date  . BREAST BIOPSY  2012   Underarm-rt-extra tissue  . COLONOSCOPY  age 66 yo   Screen  . ETHMOIDECTOMY Left 07/10/2013   Procedure: ETHMOIDECTOMY LEFT,MAXILLARY OSTIAL ENLARGEMENT WITH REMOVAL OF DISEASE;  Surgeon: Rozetta Nunnery, MD;  Location: [place];  Service: ENT;  Laterality: Left;  . NOSE SURGERY  2015  . TUBAL LIGATION  1980s  . TURBINATE REDUCTION Left 07/10/2013   Procedure: LEFT TURBINATE REDUCTION;  Surgeon: Rozetta Nunnery, MD;  Location: [facility];  Service: ENT;  Laterality: Left;  . UPPER GI ENDOSCOPY  FAMILY HISTORY: Family History  Problem Relation Age of Onset  . Throat cancer Father        was a smoker  . Colon cancer Father   . Alcohol abuse Father   . Cancer Father 29       colon cancer  . HIV Sister   . Alcohol abuse Mother   . Diabetes Mother   . Heart disease Mother   . Cancer Mother   . Diabetes Paternal Grandmother   . Dementia Paternal Grandmother   . Hypertension Paternal Grandmother   . Breast cancer Paternal Grandmother   . Skin cancer Paternal Grandmother   . Diabetes Maternal Grandmother   . Heart disease Sister 6       died age 74 "Heart was weak"  . Stroke Sister   . Sudden Cardiac Death Neg Hx     SOCIAL HISTORY: Social History   Socioeconomic History  . Marital status: Single    Spouse name: Not on file  . Number of children: 4  . Years of education: 11  . Highest education level: Not on file  Occupational History  . Occupation: unemployed    Fish farm manager: [employer]    Comment: house keeping  Tobacco Use  . Smoking status: Former Smoker    Packs/day: 0.30    Years: 15.00    Pack years: 4.50    Types: Cigarettes    Quit date: 01/01/1998    Years since quitting: 21.6  . Smokeless tobacco: Never Used  . Tobacco comment: "never inhaled"  Vaping Use  . Vaping Use: Never used  Substance and Sexual Activity  . Alcohol use: Yes     Alcohol/week: 1.0 standard drink    Types: 1 Glasses of wine per week    Comment: 1-2x per month   . Drug use: No    Comment: Last crack cocaine 1998  . Sexual activity: Not Currently    Birth control/protection: Surgical  Other Topics Concern  . Not on file  Social History Narrative   Single. Education: [employer].   Right-handed.   1-2 cups caffeine per day.   Lives with an elderly gentleman she cares for and her 57 yo Psychiatrist.   Social Determinants of Health   Financial Resource Strain:   . Difficulty of Paying Living Expenses:   Food Insecurity:   . Worried About Charity fundraiser in the Last Year:   . Arboriculturist in the Last Year:   Transportation Needs:   . Film/video editor (Medical):   Marland Kitchen Lack of Transportation (Non-Medical):   Physical Activity:   . Days of Exercise per Week:   . Minutes of Exercise per Session:   Stress:   . Feeling of Stress :   Social Connections:   . Frequency of Communication with Friends and Family:   . Frequency of Social Gatherings with Friends and Family:   . Attends Religious Services:   . Active Member of Clubs or Organizations:   . Attends Archivist Meetings:   Marland Kitchen Marital Status:   Intimate Partner Violence:   . Fear of Current or Ex-Partner:   . Emotionally Abused:   Marland Kitchen Physically Abused:   . Sexually Abused:       PHYSICAL EXAM  Vitals:   08/19/19 1427  BP: 119/73  Pulse: 74  Weight: 257 lb (116.6 kg)  Height: _0  (1.676 m)   Body mass index is 41.48 kg/m.  Generalized: Well developed, in no  acute distress  Cardiology: normal rate and rhythm, no murmur noted Respiratory: clear to auscultation bilaterally  Neurological examination  Mentation: Alert oriented to time, place, history taking. Follows all commands speech and language fluent Cranial nerve II-XII: Pupils were equal round reactive to light. Extraocular movements were full Motor: The motor testing reveals 5 over 5 strength of all  4 extremities. Good symmetric motor tone is noted throughout.  Gait and station: Gait is normal.    DIAGNOSTIC DATA (LABS, IMAGING, TESTING) - I reviewed patient records, labs, notes, testing and imaging myself where available.  No flowsheet data found.   Lab Results  Component Value Date   WBC 6.5 03/24/2019   HGB 12.7 03/24/2019   HCT 37.3 03/24/2019   MCV 87 03/24/2019   PLT 318 03/24/2019      Component Value Date/Time   NA 141 03/24/2019 1550   K 3.8 03/24/2019 1550   CL 103 03/24/2019 1550   CO2 26 03/24/2019 1550   GLUCOSE 81 03/24/2019 1550   GLUCOSE 125 (H) 12/21/2015 1002   BUN 12 03/24/2019 1550   CREATININE 0.66 03/24/2019 1550   CREATININE 0.59 04/05/2015 1417   CALCIUM 9.6 03/24/2019 1550   PROT 5.9 (L) 11/11/2018 1250   ALBUMIN 4.0 11/11/2018 1250   AST 21 11/11/2018 1250   ALT 20 11/11/2018 1250   ALKPHOS 102 11/11/2018 1250   BILITOT 0.4 11/11/2018 1250   GFRNONAA 99 03/24/2019 1550   GFRNONAA >89 11/04/2014 2040   GFRAA 114 03/24/2019 1550   GFRAA >89 11/04/2014 2040   Lab Results  Component Value Date   CHOL 141 11/11/2018   HDL 39 (L) 11/11/2018   LDLCALC 84 11/11/2018   TRIG 93 11/11/2018   CHOLHDL 3.6 11/11/2018   Lab Results  Component Value Date   HGBA1C 6.4 03/24/2019   Lab Results  Component Value Date   VITAMINB12 849 04/30/2013   Lab Results  Component Value Date   TSH 0.912 03/24/2019       ASSESSMENT AND PLAN 59 y.o. year old female  has a past medical history of Allergy, Arthritis, Blood transfusion, Blood transfusion without reported diagnosis, Coronary artery calcification seen on CT scan, DM2 (diabetes mellitus, type 2) ([place]), GERD (gastroesophageal reflux disease), Headache(784.0), Heart murmur, History of echocardiogram, HLD (hyperlipidemia), Hypertension, Migraine, Pain, joint, shoulder region, right, Plantar fasciitis of left foot (09/04/2016), Substance abuse ([place]), Tuberculosis (1998), Urinary urgency, and Vertigo.  here with     ICD-10-CM   1. OSA on CPAP  G47.33    Z99.89      Iraida is doing very well on CPAP therapy.  Compliance report reveals excellent compliance.  I have commended her today and encouraged her to continue using CPAP nightly and for greater than 4 hours each night.  We will monitor concerns of headaches closely.  Headache has only occurred a couple of times.  I am uncertain if this is related to CPAP therapy.  She will let me know if there is any worsening.  Healthy lifestyle habits encouraged.  She will follow up with me in 1 year, sooner if needed.  She verbalizes understanding and agreement with this plan.  No orders of the defined types were placed in this encounter.    No orders of the defined types were placed in this encounter.     I spent 15 minutes with the patient. 50% of this time was spent counseling and educating patient on plan of care and medications.    Jermall Isaacson  Ubaldo Glassing, FNP-C 08/19/2019, 2:46 PM [facility] [address] [place], [place] [address] [phone]  I reviewed the above note and documentation by the Nurse Practitioner and agree with the history, exam, assessment and plan as outlined above. I was available for consultation. Star Age, MD, PhD [facility])

## 2019-08-19 NOTE — Patient Instructions (Signed)
Please continue using your CPAP regularly. While your insurance requires that you use CPAP at least 4 hours each night on 70% of the nights, I recommend, that you not skip any nights and use it throughout the night if you can. Getting used to CPAP and staying with the treatment long term does take time and patience and discipline. Untreated obstructive sleep apnea when it is moderate to severe can have an adverse impact on cardiovascular health and raise her risk for heart disease, arrhythmias, hypertension, congestive heart failure, stroke and diabetes. Untreated obstructive sleep apnea causes sleep disruption, nonrestorative sleep, and sleep deprivation. This can have an impact on your day to day functioning and cause daytime sleepiness and impairment of cognitive function, memory loss, mood disturbance, and problems focussing. Using CPAP regularly can improve these symptoms.   Follow up in 1 year   Sleep Apnea Sleep apnea affects breathing during sleep. It causes breathing to stop for a short time or to become shallow. It can also increase the risk of:  Heart attack.  Stroke.  Being very overweight (obese).  Diabetes.  Heart failure.  Irregular heartbeat. The goal of treatment is to help you breathe normally again. What are the causes? There are three kinds of sleep apnea:  Obstructive sleep apnea. This is caused by a blocked or collapsed airway.  Central sleep apnea. This happens when the brain does not send the right signals to the muscles that control breathing.  Mixed sleep apnea. This is a combination of obstructive and central sleep apnea. The most common cause of this condition is a collapsed or blocked airway. This can happen if:  Your throat muscles are too relaxed.  Your tongue and tonsils are too large.  You are overweight.  Your airway is too small. What increases the risk?  Being overweight.  Smoking.  Having a small airway.  Being older.  Being  female.  Drinking alcohol.  Taking medicines to calm yourself (sedatives or tranquilizers).  Having family members with the condition. What are the signs or symptoms?  Trouble staying asleep.  Being sleepy or tired during the day.  Getting angry a lot.  Loud snoring.  Headaches in the morning.  Not being able to focus your mind (concentrate).  Forgetting things.  Less interest in sex.  Mood swings.  Personality changes.  Feelings of sadness (depression).  Waking up a lot during the night to pee (urinate).  Dry mouth.  Sore throat. How is this diagnosed?  Your medical history.  A physical exam.  A test that is done when you are sleeping (sleep study). The test is most often done in a sleep lab but may also be done at home. How is this treated?   Sleeping on your side.  Using a medicine to get rid of mucus in your nose (decongestant).  Avoiding the use of alcohol, medicines to help you relax, or certain pain medicines (narcotics).  Losing weight, if needed.  Changing your diet.  Not smoking.  Using a machine to open your airway while you sleep, such as: ? An oral appliance. This is a mouthpiece that shifts your lower jaw forward. ? A CPAP device. This device blows air through a mask when you breathe out (exhale). ? An EPAP device. This has valves that you put in each nostril. ? A BPAP device. This device blows air through a mask when you breathe in (inhale) and breathe out.  Having surgery if other treatments do not work. It is   important to get treatment for sleep apnea. Without treatment, it can lead to:  High blood pressure.  Coronary artery disease.  In men, not being able to have an erection (impotence).  Reduced thinking ability. Follow these instructions at home: Lifestyle  Make changes that your doctor recommends.  Eat a healthy diet.  Lose weight if needed.  Avoid alcohol, medicines to help you relax, and some pain  medicines.  Do not use any products that contain nicotine or tobacco, such as cigarettes, e-cigarettes, and chewing tobacco. If you need help quitting, ask your doctor. General instructions  Take over-the-counter and prescription medicines only as told by your doctor.  If you were given a machine to use while you sleep, use it only as told by your doctor.  If you are having surgery, make sure to tell your doctor you have sleep apnea. You may need to bring your device with you.  Keep all follow-up visits as told by your doctor. This is important. Contact a doctor if:  The machine that you were given to use during sleep bothers you or does not seem to be working.  You do not get better.  You get worse. Get help right away if:  Your chest hurts.  You have trouble breathing in enough air.  You have an uncomfortable feeling in your back, arms, or stomach.  You have trouble talking.  One side of your body feels weak.  A part of your face is hanging down. These symptoms may be an emergency. Do not wait to see if the symptoms will go away. Get medical help right away. Call your local emergency services (911 in the [place] Do not drive yourself to the hospital. Summary  This condition affects breathing during sleep.  The most common cause is a collapsed or blocked airway.  The goal of treatment is to help you breathe normally while you sleep. This information is not intended to replace advice given to you by your health care provider. Make sure you discuss any questions you have with your health care provider. Document Revised: 10/04/2017 Document Reviewed: 08/13/2017 Elsevier Patient Education  2020 Elsevier Inc.  

## 2019-08-26 ENCOUNTER — Encounter: Payer: Self-pay | Admitting: Family Medicine

## 2019-08-26 ENCOUNTER — Ambulatory Visit: Payer: Medicaid Other | Admitting: Family Medicine

## 2019-08-26 ENCOUNTER — Other Ambulatory Visit (HOSPITAL_COMMUNITY)
Admission: RE | Admit: 2019-08-26 | Discharge: 2019-08-26 | Disposition: A | Discharge status: Home / Self Care | Payer: Medicaid Other | Source: Ambulatory Visit | Attending: Family Medicine | Admitting: Family Medicine

## 2019-08-26 ENCOUNTER — Other Ambulatory Visit: Payer: Self-pay

## 2019-08-26 VITALS — Ht 66.0 in | Wt 256.0 lb

## 2019-08-26 DIAGNOSIS — N750 Cyst of Bartholin's gland: Secondary | ICD-10-CM | POA: Diagnosis present

## 2019-08-26 DIAGNOSIS — M25511 Pain in right shoulder: Secondary | ICD-10-CM

## 2019-08-26 DIAGNOSIS — N898 Other specified noninflammatory disorders of vagina: Secondary | ICD-10-CM

## 2019-08-26 DIAGNOSIS — F321 Major depressive disorder, single episode, moderate: Secondary | ICD-10-CM

## 2019-08-26 DIAGNOSIS — M25561 Pain in right knee: Secondary | ICD-10-CM

## 2019-08-26 DIAGNOSIS — G8929 Other chronic pain: Secondary | ICD-10-CM | POA: Diagnosis not present

## 2019-08-26 DIAGNOSIS — M542 Cervicalgia: Secondary | ICD-10-CM

## 2019-08-26 LAB — POCT WET PREP (WET MOUNT)
Clue Cells Wet Prep Whiff POC: NEGATIVE
Trichomonas Wet Prep HPF POC: ABSENT
WBC, Wet Prep HPF POC: >20
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
## 2019-08-26 NOTE — Patient Instructions (Addendum)
Thank you for coming to see me today. It was a pleasure. Today we talked about:   Take scheduled tylenol every 6-8 hrs.  Use voltaren gel on the affected areas as needed.  Ice the areas at the end of the day.  Come back if no improvement.  You can also consider further injections and going back to see Dr. Marlou Sa.  You have a bartholin cyst.  Use warm compresses at least 4 times a day.  If the pain worsens or does not improve in the next 2 weeks, please come back.  Please follow-up with me in November.    If you have any questions or concerns, please do not hesitate to call the office at [phone].  Best,   [place] Constable, DO

## 2019-08-27 DIAGNOSIS — N750 Cyst of Bartholin's gland: Secondary | ICD-10-CM | POA: Insufficient documentation

## 2019-08-27 DIAGNOSIS — G8929 Other chronic pain: Secondary | ICD-10-CM | POA: Insufficient documentation

## 2019-08-27 DIAGNOSIS — N898 Other specified noninflammatory disorders of vagina: Secondary | ICD-10-CM | POA: Insufficient documentation

## 2019-08-27 LAB — CERVICOVAGINAL ANCILLARY ONLY
Chlamydia: NEGATIVE
Comment: NEGATIVE
Comment: NORMAL
Neisseria Gonorrhea: NEGATIVE

## 2019-08-27 NOTE — Assessment & Plan Note (Signed)
Likely 2/2 known OA.  Last injection 4/9.  Advised can have injections 3 months apart if not lasting 6 months.  Already follows with Dr. Marlou Sa, advised going back if needed.  Also advised using tylenol for pain to start and voltaren gel.

## 2019-08-27 NOTE — Assessment & Plan Note (Signed)
Now improved.  No longer taking prozac.  Denies SI.

## 2019-08-27 NOTE — Assessment & Plan Note (Signed)
Exam consistent with suspected bartholin cyst beginning to form.  Advised to use warm compresses at least 4 times daily.  If worsening or no improvement in 2 weeks, patient to come back.  Advised that if worsens, drainage may be necessary.

## 2019-08-27 NOTE — Assessment & Plan Note (Signed)
Wet prep negative.  G/C also obtained.  Irritation is likely 2/2 bartholin cyst.

## 2019-08-27 NOTE — Assessment & Plan Note (Signed)
Likely also referred pain from neck and known spondylosis.  Advised against chronic opioids, will try voltaren gel and tylenol prn.  Can f/u with Dr. Marlou Sa if she prefers. Could also consider injection if she would like.

## 2019-08-27 NOTE — Assessment & Plan Note (Signed)
Known cervical spondylosis on prior imaging.  Likely  Contributing to shoulder pain as well.  Has received injections previously as well.  Advised to try tylenol and can f/u with Dr. Marlou Sa since already seeing.

## 2019-09-07 ENCOUNTER — Other Ambulatory Visit: Payer: Self-pay | Admitting: Family Medicine

## 2019-09-10 ENCOUNTER — Other Ambulatory Visit: Payer: Self-pay

## 2019-09-10 MED ORDER — ESOMEPRAZOLE MAGNESIUM 40 MG PO CPDR
40.0000 mg | DELAYED_RELEASE_CAPSULE | Freq: Every day | ORAL | 3 refills | Status: DC
Start: 2019-09-10 — End: 2020-02-19

## 2019-09-24 NOTE — Progress Notes (Signed)
Cardiology Office Note:    Date:  09/25/2019   ID:  Brianna Hawkins, DOB 31-Jul-1960, MRN [id number]  PCP:  Cleophas Dunker, DO  [facility] Cardiologist:  Mertie Moores, MD / Richardson Dopp, PA-C  [place] Electrophysiologist:  None   Referring MD: Cleophas Dunker, *   Chief Complaint:  Follow-up (coronary calcification)    Patient Profile:    Brianna Hawkins is a 59 y.o. female with:   Coronary calcification ? CTA 9/19: Ca score 1; no obstructive CAD   Hypertension  Diabetes mellitus  Hyperlipidemia  GERD  Remote smoking history  Possible family history of CAD  OSA   Venous insufficiency ? Eval by Dr. Donnetta Hutching in 09/2018 >> no indication for vein ablation  Prior CV studies: Echocardiogram 03/26/19 EF 50-55, no RWMA, normal RVSF, mild AoV sclerosis (no AS)  Coronary CTA 09/20/17 IMPRESSION: 1. Coronary artery calcium score 1 Agatston unit, placing the patient in the 80th percentile for age and gender. Given relatively young age, this suggests high risk for future cardiac events. 2. No obstructive coronary disease.  Echo 09/10/17 EF 55-60, no RWMA, normal diastolic function, trace MR/TR  Event monitor 03/2012 Normal sinus rhythm  Echocardiogram 01/2010 Reportedly normal-EF 63%, mild LAE  History of Present Illness:    Brianna Hawkins was last seen in 09/2018.  She noted continued symptoms of dyspnea on exertion which had been chronic for years.  She returns for follow up.  She is here alone.  She continues to have shortness of breath with some activities.  She used to work at [employer].  She retired 2 years ago and has not been as active.  She has noted worsening shortness of breath since then.  Overall, her breathing is stable.  She has not had chest discomfort, syncope, orthopnea.  She uses CPAP at night.  She has not had significant leg swelling.      Past Medical History:  Diagnosis Date  . Allergy   . Arthritis   . Blood  transfusion    with child birth  . Blood transfusion without reported diagnosis   . Coronary artery calcification seen on CT scan    Coronary CTA 9/19: Ca score = 1 (80th percentile); calcified plaque distal LM; no obstructive CAD.  Marland Kitchen DM2 (diabetes mellitus, type 2) ([place])   . GERD (gastroesophageal reflux disease)   . Headache(784.0)    migraine HAs  . Heart murmur    very mild-echo 1/15-normal  . History of echocardiogram    Echo 9/19: EF 55-60, no RWMA, normal diastolic function, trace MR/TR  . HLD (hyperlipidemia)   . Hypertension   . Migraine   . Pain, joint, shoulder region, right   . Plantar fasciitis of left foot 09/04/2016  . Substance abuse ([place])   . 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  Marland Kitchen Urinary urgency   . Vertigo     Current Medications: Current Meds  Medication Sig  . AGAMATRIX ULTRA-THIN LANCETS MISC Check sugars twice daily before meals  . albuterol (VENTOLIN HFA) 108 (90 Base) MCG/ACT inhaler Inhale 2 puffs into the lungs every 6 (six) hours as needed for wheezing or shortness of breath.  Marland Kitchen amLODipine (NORVASC) 10 MG tablet Take 1 tablet by mouth once daily  . aspirin EC 81 MG tablet Take 1 tablet (81 mg total) by mouth daily.  Marland Kitchen atorvastatin (LIPITOR) 40 MG tablet Take 1 tablet (40 mg total) by mouth daily.  . Blood Glucose Monitoring Suppl (  AGAMATRIX PRESTO) w/Device KIT Check sugars twice daily before meals  . Cholecalciferol (VITAMIN D3) 50 MCG (2000 UT) TABS Take 5,000 Units by mouth daily.   Marland Kitchen esomeprazole (NEXIUM) 40 MG capsule Take 1 capsule (40 mg total) by mouth daily.  Marland Kitchen FLUoxetine (PROZAC) 20 MG capsule Take 1 capsule (20 mg total) by mouth daily.  . fluticasone (FLONASE) 50 MCG/ACT nasal spray Place 2 sprays into both nostrils 2 (two) times daily.  Marland Kitchen glucose blood (AGAMATRIX PRESTO TEST) test strip Check blood glucose twice daily before meals  . hydrochlorothiazide (HYDRODIURIL) 25 MG tablet Take 1 tablet by mouth daily  . metFORMIN (GLUCOPHAGE-XR) 500 MG  24 hr tablet TAKE 1 TABLET BY MOUTH TWICE DAILY WITH MEALS  . Multiple Vitamins-Minerals (HAIR/SKIN/NAILS) TABS Take 1 tablet by mouth daily.  . nitroGLYCERIN (NITROSTAT) 0.4 MG SL tablet 1 tab under tongue for chest pressure, may repeat every 5 minutes if not relieved for total of 3 doses.  . rizatriptan (MAXALT) 10 MG tablet Take 10 mg by mouth as needed for migraine. May repeat in 2 hours if needed     Allergies:   Patient has no known allergies.   Social History   Tobacco Use  . Smoking status: Former Smoker    Packs/day: 0.30    Years: 15.00    Pack years: 4.50    Types: Cigarettes    Quit date: 01/01/1998    Years since quitting: 21.7  . Smokeless tobacco: Never Used  . Tobacco comment: "never inhaled"  Vaping Use  . Vaping Use: Never used  Substance Use Topics  . Alcohol use: Yes    Alcohol/week: 1.0 standard drink    Types: 1 Glasses of wine per week    Comment: 1-2x per month   . Drug use: No    Comment: Last crack cocaine 1998     Family Hx: The patient's family history includes Alcohol abuse in her father and mother; Breast cancer in her paternal grandmother; Cancer in her mother; Cancer (age of onset: 47) in her father; Colon cancer in her father; Dementia in her paternal grandmother; Diabetes in her maternal grandmother, mother, and paternal grandmother; HIV in her sister; Heart disease in her mother; Heart disease (age of onset: 93) in her sister; Hypertension in her paternal grandmother; Skin cancer in her paternal grandmother; Stroke in her sister; Throat cancer in her father. There is no history of Sudden Cardiac Death.  ROS   EKGs/Labs/Other Test Reviewed:    EKG:  EKG is   ordered today.  The ekg ordered today demonstrates normal sinus rhythm, heart rate  75, normal axis, nonspecific ST-T wave changes, QTC 424  Recent Labs: 11/11/2018: ALT 20 03/24/2019: BUN 12; Creatinine, Ser 0.66; Hemoglobin 12.7; Platelets 318; Potassium 3.8; Sodium 141; TSH 0.912    Recent Lipid Panel Lab Results  Component Value Date/Time   CHOL 141 11/11/2018 12:50 PM   TRIG 93 11/11/2018 12:50 PM   HDL 39 (L) 11/11/2018 12:50 PM   CHOLHDL 3.6 11/11/2018 12:50 PM   CHOLHDL 3.5 10/02/2012 09:34 AM   LDLCALC 84 11/11/2018 12:50 PM    Physical Exam:    VS:  BP 108/68   Pulse 75   Ht _0  (1.676 m)   Wt 257 lb (116.6 kg)   LMP 12/11/2013   SpO2 97%   BMI 41.48 kg/m     Wt Readings from Last 3 Encounters:  09/25/19 257 lb (116.6 kg)  08/26/19 256 lb (116.1 kg)  08/19/19  257 lb (116.6 kg)     Constitutional:      Appearance: Healthy appearance. Not in distress.  Neck:     Vascular: JVD normal.  Pulmonary:     Effort: Pulmonary effort is normal.     Breath sounds: No wheezing. No rales.  Cardiovascular:     Normal rate. Regular rhythm. Normal S1. Normal S2.     Murmurs: There is no murmur.  Edema:    Peripheral edema absent.  Abdominal:     Palpations: Abdomen is soft.  Skin:    General: Skin is warm and dry.  Neurological:     Mental Status: Alert and oriented to person, place and time.     Cranial Nerves: Cranial nerves are intact.      ASSESSMENT & PLAN:    1. Coronary artery calcification seen on CT scan No anginal symptoms.  Continue aspirin, atorvastatin.  Follow-up in 1 year.  We can consider a CT cardiac score at that time for follow-up.  2. Essential hypertension The patient's blood pressure is controlled on her current regimen.  Continue current therapy.   3. Hyperlipidemia, unspecified hyperlipidemia type LDL optimal on most recent lab work.  Continue current Rx.    4. Type 2 diabetes mellitus without complication, without long-term current use of insulin ([place]) Well-controlled.  Recent A1c 6.4.  We discussed increased activity and weight loss in order to help improve her breathing.  She could consider semaglutide to help with weight loss if her diabetes becomes more difficult to control.  5. Shortness of breath She  notes worsening shortness of breath since she quit working and gained weight.  She does have some wheezing and uses an albuterol inhaler with relief.  She may have a component of reactive airways disease.  Recent echocardiogram demonstrated normal EF.  I suspect her shortness of breath is multifactorial and related to deconditioning, obesity, +/- reactive airways disease.  I will obtain a BNP today.  If elevated, change HCTZ to Furosemide.  I have recommended a walking program with a goal of 30 min a day for 5-6 days a week as well weight loss (5-10 lbs).   Dispo:  Return in about 1 year (around 09/24/2020) for Routine Follow Up, w/ Richardson Dopp, PA-C, in person.   Medication Adjustments/Labs and Tests Ordered: Current medicines are reviewed at length with the patient today.  Concerns regarding medicines are outlined above.  Tests Ordered: Orders Placed This Encounter  Procedures  . Pro b natriuretic peptide (BNP)  . EKG 12-Lead   Medication Changes: No orders of the defined types were placed in this encounter.   Signed, Richardson Dopp, PA-C  09/25/2019 12:46 PM    [facility] [address], [place], [place]  [address] Phone: [phone]; Fax: [phone]

## 2019-09-25 ENCOUNTER — Other Ambulatory Visit: Payer: Self-pay

## 2019-09-25 ENCOUNTER — Ambulatory Visit: Payer: Medicaid Other | Admitting: Physician Assistant

## 2019-09-25 ENCOUNTER — Encounter: Payer: Self-pay | Admitting: Physician Assistant

## 2019-09-25 VITALS — BP 108/68 | HR 75 | Ht 66.0 in | Wt 257.0 lb

## 2019-09-25 DIAGNOSIS — I10 Essential (primary) hypertension: Secondary | ICD-10-CM

## 2019-09-25 DIAGNOSIS — E119 Type 2 diabetes mellitus without complications: Secondary | ICD-10-CM | POA: Diagnosis not present

## 2019-09-25 DIAGNOSIS — I2510 Atherosclerotic heart disease of native coronary artery without angina pectoris: Secondary | ICD-10-CM | POA: Diagnosis not present

## 2019-09-25 DIAGNOSIS — E785 Hyperlipidemia, unspecified: Secondary | ICD-10-CM

## 2019-09-25 DIAGNOSIS — R0602 Shortness of breath: Secondary | ICD-10-CM

## 2019-09-25 NOTE — Patient Instructions (Signed)
Medication Instructions:  Your physician recommends that you continue on your current medications as directed. Please refer to the Current Medication list given to you today.  *If you need a refill on your cardiac medications before your next appointment, please call your pharmacy*  Lab Work: You will have labs drawn today: BNP  Testing/Procedures: None ordered today  Follow-Up: At [facility], you and your health needs are our priority.  As part of our continuing mission to provide you with exceptional heart care, we have created designated Provider Care Teams.  These Care Teams include your primary Cardiologist (physician) and Advanced Practice Providers (APPs -  Physician Assistants and Nurse Practitioners) who all work together to provide you with the care you need, when you need it.  Your next appointment:    12 month(s)  The format for your next appointment:   In Person  Provider:   Richardson Dopp, PA-C

## 2019-09-26 LAB — PRO B NATRIURETIC PEPTIDE: NT-Pro BNP: 23 pg/mL (ref 0–287)

## 2019-09-28 ENCOUNTER — Ambulatory Visit: Payer: Medicaid Other | Admitting: Physician Assistant

## 2019-10-27 ENCOUNTER — Other Ambulatory Visit: Payer: Self-pay | Admitting: Family Medicine

## 2019-10-28 ENCOUNTER — Telehealth: Payer: Self-pay

## 2019-10-28 NOTE — Telephone Encounter (Signed)
Patient called she wants to see if she can get pre approval on injection in right knee and her right shoulder before she makes a appointment. call back:[phone]

## 2019-10-28 NOTE — Telephone Encounter (Signed)
See below. Please advise. Thanks.  

## 2019-10-28 NOTE — Telephone Encounter (Signed)
Patient has medicaid, will have to mail [employer] application to patient's address listed.  We no longer do gel injections in the shoulder.  Called and left patient a VM advising that [employer] application will be mailed.

## 2019-11-02 ENCOUNTER — Encounter (HOSPITAL_COMMUNITY): Payer: Self-pay

## 2019-11-02 ENCOUNTER — Ambulatory Visit (INDEPENDENT_AMBULATORY_CARE_PROVIDER_SITE_OTHER): Payer: Medicaid Other

## 2019-11-02 ENCOUNTER — Other Ambulatory Visit: Payer: Self-pay

## 2019-11-02 ENCOUNTER — Ambulatory Visit (HOSPITAL_COMMUNITY)
Admission: EM | Admit: 2019-11-02 | Discharge: 2019-11-02 | Disposition: A | Discharge status: Home / Self Care | Payer: Medicaid Other | Attending: Family Medicine | Admitting: Family Medicine

## 2019-11-02 ENCOUNTER — Telehealth: Payer: Self-pay

## 2019-11-02 DIAGNOSIS — R059 Cough, unspecified: Secondary | ICD-10-CM | POA: Diagnosis not present

## 2019-11-02 DIAGNOSIS — Z20822 Contact with and (suspected) exposure to covid-19: Secondary | ICD-10-CM | POA: Insufficient documentation

## 2019-11-02 LAB — SARS CORONAVIRUS 2 (TAT 6-24 HRS): SARS Coronavirus 2: NEGATIVE

## 2019-11-02 MED ORDER — CETIRIZINE HCL 10 MG PO TABS: 10.0000 mg | ORAL_TABLET | Freq: Every day | ORAL | 0 refills | Status: DC

## 2019-11-02 MED ORDER — GUAIFENESIN 100 MG/5ML PO LIQD: 100.0000 mg | ORAL | 0 refills | Status: DC | PRN

## 2019-11-02 NOTE — Telephone Encounter (Signed)
Mailed [employer] application to patient's address listed in the chart for Monovisc, right knee.

## 2019-11-02 NOTE — ED Triage Notes (Signed)
Pt c/o non-productive cough, sore throat onset Saturday.  Denies fever, SOB, congestion, runny nose, abdominal pain, n/v/d, body aches. Has been using hot teas and cough drops for symptoms.

## 2019-11-02 NOTE — ED Provider Notes (Signed)
Echo    CSN: [id number] Arrival date & time: 11/02/19  5329      History   Chief Complaint Chief Complaint  Patient presents with  . Sore Throat  . Cough    HPI Brianna Hawkins is a 59 y.o. female.   Patient is a 59 year old female presents today with productive cough, sore throat onset Saturday.  Cough with green mucus.  Cough presently is.  She has been using hot teas and cough drops for symptoms.  No associated shortness of breath, congestion, runny nose, body aches or fever.     Past Medical History:  Diagnosis Date  . Allergy   . Arthritis   . Blood transfusion    with child birth  . Blood transfusion without reported diagnosis   . Coronary artery calcification seen on CT scan    Coronary CTA 9/19: Ca score = 1 (80th percentile); calcified plaque distal LM; no obstructive CAD.  Marland Kitchen DM2 (diabetes mellitus, type 2) ([place])   . GERD (gastroesophageal reflux disease)   . Headache(784.0)    migraine HAs  . Heart murmur    very mild-echo 1/15-normal  . History of echocardiogram    Echo 9/19: EF 55-60, no RWMA, normal diastolic function, trace MR/TR  . HLD (hyperlipidemia)   . Hypertension   . Migraine   . Pain, joint, shoulder region, right   . Plantar fasciitis of left foot 09/04/2016  . Substance abuse ([place])   . 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  Marland Kitchen Urinary urgency   . Vertigo     Patient Active Problem List   Diagnosis Date Noted  . Bartholin gland cyst 08/27/2019  . Vaginal irritation 08/27/2019  . Chronic pain of right knee 08/27/2019  . Chronic neck pain 08/27/2019  . Fatigue 03/27/2019  . Depression, major, single episode, moderate ([place]) 03/27/2019  . Venous insufficiency of both lower extremities 08/11/2018  . Screening for malignant neoplasm of cervix 08/11/2018  . Hypercholesterolemia 11/07/2017  . Coronary artery calcification seen on CT scan   . DM2 (diabetes mellitus, type 2) ([place])   . Plantar fasciitis of left foot 09/04/2016    . Chronic right shoulder pain 03/03/2015  . Paresthesia 12/01/2014  . Chronic migraine without aura without status migrainosus, not intractable 11/11/2014  . Morbid obesity ([place]) 11/11/2014  . Chronic sinusitis 07/06/2013  . Palpitations 02/18/2012  . BMI 34.0-34.9,adult 08/02/2011  . Chest pain 04/21/2011  . Abnormal CXR 04/21/2011  . Dyspnea 04/21/2011  . Hypertension 02/08/2011  . Vertigo, benign positional 02/08/2011  . Insomnia 02/08/2011  . Tuberculosis 01/02/1996    Past Surgical History:  Procedure Laterality Date  . BREAST BIOPSY  2012   Underarm-rt-extra tissue  . COLONOSCOPY  age 63 yo   Screen  . ETHMOIDECTOMY Left 07/10/2013   Procedure: ETHMOIDECTOMY LEFT,MAXILLARY OSTIAL ENLARGEMENT WITH REMOVAL OF DISEASE;  Surgeon: Rozetta Nunnery, MD;  Location: [facility];  Service: ENT;  Laterality: Left;  . NOSE SURGERY  2015  . TUBAL LIGATION  1980s  . TURBINATE REDUCTION Left 07/10/2013   Procedure: LEFT TURBINATE REDUCTION;  Surgeon: Rozetta Nunnery, MD;  Location: [facility];  Service: ENT;  Laterality: Left;  . UPPER GI ENDOSCOPY      OB History   No obstetric history on file.      Home Medications    Prior to Admission medications   Medication Sig Start Date End Date Taking? Authorizing Provider  albuterol (VENTOLIN HFA) 108 (  90 Base) MCG/ACT inhaler Inhale 2 puffs into the lungs every 6 (six) hours as needed for wheezing or shortness of breath. 07/21/18  Yes Meccariello, Bernita Raisin, DO  amLODipine (NORVASC) 10 MG tablet Take 1 tablet by mouth once daily 08/11/19  Yes Meccariello, Bernita Raisin, DO  aspirin EC 81 MG tablet Take 1 tablet (81 mg total) by mouth daily. 05/15/16  Yes Mack Hook, MD  atorvastatin (LIPITOR) 40 MG tablet Take 1 tablet (40 mg total) by mouth daily. 03/24/19 11/02/19 Yes Benay Pike, MD  Cholecalciferol (VITAMIN D3) 50 MCG (2000 UT) TABS Take 5,000 Units by mouth daily.    Yes [provider]  esomeprazole (NEXIUM) 40 MG capsule Take 1 capsule (40 mg total) by mouth daily. 09/10/19  Yes Meccariello, Bernita Raisin, DO  FLUoxetine (PROZAC) 20 MG capsule Take 1 capsule (20 mg total) by mouth daily. 03/26/19  Yes Meccariello, Bernita Raisin, DO  fluticasone (FLONASE) 50 MCG/ACT nasal spray Place 2 sprays into both nostrils 2 (two) times daily. 11/12/18  Yes Nuala [place], DO  hydrochlorothiazide (HYDRODIURIL) 25 MG tablet Take 1 tablet by mouth daily 08/11/19  Yes Meccariello, Bernita Raisin, DO  metFORMIN (GLUCOPHAGE-XR) 500 MG 24 hr tablet TAKE 1 TABLET BY MOUTH TWICE DAILY WITH MEALS 10/27/19  Yes Meccariello, Bernita Raisin, DO  AGAMATRIX ULTRA-THIN LANCETS MISC Check sugars twice daily before meals 08/07/17   Mack Hook, MD  Blood Glucose Monitoring Suppl (AGAMATRIX PRESTO) w/Device KIT Check sugars twice daily before meals 08/07/17   Mack Hook, MD  cetirizine (ZYRTEC) 10 MG tablet Take 1 tablet (10 mg total) by mouth daily. 11/02/19   Loura Halt A, NP  glucose blood (AGAMATRIX PRESTO TEST) test strip Check blood glucose twice daily before meals 08/07/17   Mack Hook, MD  guaiFENesin (ROBITUSSIN) 100 MG/5ML liquid Take 5-10 mLs (100-200 mg total) by mouth every 4 (four) hours as needed for cough. 11/02/19   Loura Halt A, NP  Multiple Vitamins-Minerals (HAIR/SKIN/NAILS) TABS Take 1 tablet by mouth daily.    [provider]  nitroGLYCERIN (NITROSTAT) 0.4 MG SL tablet 1 tab under tongue for chest pressure, may repeat every 5 minutes if not relieved for total of 3 doses. 08/07/17   Mack Hook, MD  rizatriptan (MAXALT) 10 MG tablet Take 10 mg by mouth as needed for migraine. May repeat in 2 hours if needed    [provider]    Family History Family History  Problem Relation Age of Onset  . Throat cancer Father        was a smoker  . Colon cancer Father   . Alcohol abuse Father   . Cancer Father 97       colon cancer  . HIV Sister   . Alcohol  abuse Mother   . Diabetes Mother   . Heart disease Mother   . Cancer Mother   . Diabetes Paternal Grandmother   . Dementia Paternal Grandmother   . Hypertension Paternal Grandmother   . Breast cancer Paternal Grandmother   . Skin cancer Paternal Grandmother   . Diabetes Maternal Grandmother   . Heart disease Sister 19       died age 67 "Heart was weak"  . Stroke Sister   . Sudden Cardiac Death Neg Hx     Social History Social History   Tobacco Use  . Smoking status: Former Smoker    Packs/day: 0.30    Years: 15.00    Pack years: 4.50    Types:  Cigarettes    Quit date: 01/01/1998    Years since quitting: 21.8  . Smokeless tobacco: Never Used  . Tobacco comment: "never inhaled"  Vaping Use  . Vaping Use: Never used  Substance Use Topics  . Alcohol use: Yes    Alcohol/week: 1.0 standard drink    Types: 1 Glasses of wine per week    Comment: 1-2x per month   . Drug use: No    Comment: Last crack cocaine 1998     Allergies   Patient has no known allergies.   Review of Systems Review of Systems   Physical Exam Triage Vital Signs ED Triage Vitals  Enc Vitals Group     BP 11/02/19 1051 (!) 132/99     Pulse Rate 11/02/19 1051 73     Resp 11/02/19 1051 17     Temp 11/02/19 1051 97.6 F (36.4 C)     Temp Source 11/02/19 1051 Oral     SpO2 11/02/19 1051 100 %     Weight --      Height --      Head Circumference --      Peak Flow --      Pain Score 11/02/19 1047 0     Pain Loc --      Pain Edu? --      Excl. in [place]? --    No data found.  Updated Vital Signs BP (!) 132/99 (BP Location: Right Wrist)   Pulse 73   Temp 97.6 F (36.4 C) (Oral)   Resp 17   LMP 12/11/2013   SpO2 100%   Visual Acuity Right Eye Distance:   Left Eye Distance:   Bilateral Distance:    Right Eye Near:   Left Eye Near:    Bilateral Near:     Physical Exam Vitals and nursing note reviewed.  Constitutional:      General: She is not in acute distress.    Appearance: Normal  appearance. She is not ill-appearing, toxic-appearing or diaphoretic.  HENT:     Head: Normocephalic.     Nose: Nose normal.     Mouth/Throat:     Pharynx: Oropharynx is clear.     Comments: PND Eyes:     Conjunctiva/sclera: Conjunctivae normal.  Cardiovascular:     Rate and Rhythm: Normal rate and regular rhythm.  Pulmonary:     Effort: Pulmonary effort is normal.     Breath sounds: Normal breath sounds.  Musculoskeletal:        General: Normal range of motion.     Cervical back: Normal range of motion.  Skin:    General: Skin is warm and dry.     Findings: No rash.  Neurological:     Mental Status: She is alert.  Psychiatric:        Mood and Affect: Mood normal.      UC Treatments / Results  Labs (all labs ordered are listed, but only abnormal results are displayed) Labs Reviewed  SARS CORONAVIRUS 2 (TAT 6-24 HRS)    EKG   Radiology DG Chest 2 View  Result Date: 11/02/2019 CLINICAL DATA:  Cough for 1 week. EXAM: CHEST - 2 VIEW COMPARISON:  Chest radiograph 12/21/2015, chest CT November 15, 2014. FINDINGS: No substantial change in nodular opacities in the lateral aspect of the right upper lobe. 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 No new consolidation. No visible pleural effusions or  pneumothorax. Stable cardiomediastinal silhouette. No acute osseous abnormality. IMPRESSION: 1. 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 A CT chest could provide more sensitive evaluation for change if clinically indicated. 2. No new focal cardiopulmonary abnormality. Electronically Signed   By: Margaretha Sheffield MD   On: 11/02/2019 12:03    Procedures Procedures (including critical care time)  Medications Ordered in UC Medications - No data to display  Initial Impression / Assessment and Plan /  UC Course  I have reviewed the triage vital signs and the nursing notes.  Pertinent labs & imaging results that were available during my care of the patient were reviewed by me and considered in my medical decision making (see chart for details).     Cough X ray without pneumonia, chronic changes on x ray.  Most likely viral or allergy related.  Treating with zyrtec, mucinex , inhaler as needed.  Follow up as needed for continued or worsening symptoms  Final Clinical Impressions(s) / UC Diagnoses   Final diagnoses:  Cough     Discharge Instructions     Your x ray did not show any pneumonia I recommend zyrtec daily, mucinex and albuterol inhaler as needed See your doctor for follow up     ED Prescriptions    Medication Sig Dispense Auth. Provider   cetirizine (ZYRTEC) 10 MG tablet Take 1 tablet (10 mg total) by mouth daily. 30 tablet Yovanni Frenette A, NP   guaiFENesin (ROBITUSSIN) 100 MG/5ML liquid Take 5-10 mLs (100-200 mg total) by mouth every 4 (four) hours as needed for cough. 60 mL Riva Sesma A, NP     PDMP not reviewed this encounter.   Orvan July, NP 11/02/19 1217

## 2019-11-02 NOTE — Discharge Instructions (Signed)
Your x ray did not show any pneumonia I recommend zyrtec daily, mucinex and albuterol inhaler as needed See your doctor for follow up

## 2019-11-13 ENCOUNTER — Telehealth: Payer: Self-pay

## 2019-11-13 NOTE — Telephone Encounter (Signed)
Received [employer] application from patient for Monovisc, right knee. Faxed application to [employer] at [phone].

## 2019-11-20 ENCOUNTER — Other Ambulatory Visit: Payer: Self-pay | Admitting: Family Medicine

## 2019-11-24 ENCOUNTER — Telehealth: Payer: Self-pay | Admitting: Orthopedic Surgery

## 2019-11-24 NOTE — Telephone Encounter (Signed)
Patient called asked for a call back concerning the [employer] Application that she completed and handed it to the front desk on Dr Marlou Sa side.  Patient said she received another form 4 days ago that she filled out. Patient want to know why she received it?  The number to contact patient is [phone]

## 2019-11-25 ENCOUNTER — Telehealth: Payer: Self-pay

## 2019-11-25 NOTE — Telephone Encounter (Signed)
Talked with patient concerning form that she received from [employer].  Advised patient that the form was a renewal form [employer] and to just disregard due to new application being faxed to [employer] previously.  Patient voiced that she understands.

## 2019-11-25 NOTE — Telephone Encounter (Signed)
Received PRF form from [employer] for Monovisc, right knee.  Faxed completed PRF to [employer] at [phone].

## 2019-12-09 ENCOUNTER — Other Ambulatory Visit: Payer: Self-pay

## 2019-12-09 ENCOUNTER — Ambulatory Visit (INDEPENDENT_AMBULATORY_CARE_PROVIDER_SITE_OTHER): Payer: Medicaid Other

## 2019-12-09 ENCOUNTER — Ambulatory Visit (INDEPENDENT_AMBULATORY_CARE_PROVIDER_SITE_OTHER): Payer: Medicaid Other | Admitting: Orthopedic Surgery

## 2019-12-09 ENCOUNTER — Telehealth: Payer: Self-pay

## 2019-12-09 DIAGNOSIS — M25511 Pain in right shoulder: Secondary | ICD-10-CM

## 2019-12-09 DIAGNOSIS — M1711 Unilateral primary osteoarthritis, right knee: Secondary | ICD-10-CM | POA: Diagnosis not present

## 2019-12-09 DIAGNOSIS — M7551 Bursitis of right shoulder: Secondary | ICD-10-CM

## 2019-12-09 NOTE — Telephone Encounter (Signed)
Received Monovisc, from [employer] patient assistance. Patient is here today for gel injection.

## 2019-12-13 ENCOUNTER — Encounter: Payer: Self-pay | Admitting: Orthopedic Surgery

## 2019-12-13 NOTE — Progress Notes (Signed)
Office Visit Note   Patient: Brianna Hawkins           Date of Birth: Dec 01, 1960           MRN: [id number] Visit Date: 12/09/2019 Requested by: Cleophas Dunker, DO [address],  [place] [address] PCP: Cleophas Dunker, DO  Subjective: Chief Complaint  Patient presents with   Right Knee - Pain   Right Shoulder - Pain    HPI: Brianna Hawkins is a 59 y.o. female who presents to the office complaining of right knee pain.  Patient has continued right knee pain.  She has a history of right knee osteoarthritis.  She requests Monovisc injection today.  Additionally, patient complains of right shoulder pain.  She has had right shoulder pain since 2014.  She notes pain with passive and active abduction as well as pain with using her right arm to pull things toward her.  She has subjective weakness of the right shoulder.  Denies any history of right shoulder surgery.  She states that the pain in her right shoulder travels down into her hand and is associated with numbness and tingling at times.  She has had a prior injection into the right shoulder that helped.              ROS: All systems reviewed are negative as they relate to the chief complaint within the history of present illness.  Patient denies fevers or chills.  Assessment & Plan: Visit Diagnoses:  1. Right shoulder pain, unspecified chronicity   2. Primary osteoarthritis of right knee     Plan: Patient is a 59 year old female who presents complaining of right knee and right shoulder pain.  She has had long history of right shoulder pain with injections in the past that are provided relief.  She requests another injection today.  She does have a history of diabetes but her last A1c was 6.4.  Right shoulder radiographs reveal mild degenerative changes with enthesophyte noted at the greater tuberosity.  Right shoulder subacromial injection was administered and patient tolerated the procedure well.  Right knee  Monovisc injection administered and patient tolerated right knee injection well too.  Plan to follow-up with the office as needed.  Follow-Up Instructions: No follow-ups on file.   Orders:  Orders Placed This Encounter  Procedures   XR Shoulder Right   No orders of the defined types were placed in this encounter.     Procedures: Large Joint Inj: R knee on 12/14/2019 8:42 AM Indications: pain, joint swelling and diagnostic evaluation Details: 18 G 1.5 in needle, superolateral approach  Arthrogram: No  Medications: 5 mL lidocaine 1 %; 88 mg Hyaluronan 88 MG/4ML Outcome: tolerated well, no immediate complications Procedure, treatment alternatives, risks and benefits explained, specific risks discussed. Consent was given by the patient. Immediately prior to procedure a time out was called to verify the correct patient, procedure, equipment, support staff and site/side marked as required. Patient was prepped and draped in the usual sterile fashion.   Large Joint Inj: R subacromial bursa on 12/14/2019 8:42 AM Indications: diagnostic evaluation and pain Details: 18 G 1.5 in needle, posterior approach  Arthrogram: No  Medications: 9 mL bupivacaine 0.5 %; 40 mg methylPREDNISolone acetate 40 MG/ML; 5 mL lidocaine 1 % Outcome: tolerated well, no immediate complications Procedure, treatment alternatives, risks and benefits explained, specific risks discussed. Consent was given by the patient. Immediately prior to procedure a time out was called to verify the  correct patient, procedure, equipment, support staff and site/side marked as required. Patient was prepped and draped in the usual sterile fashion.       Clinical Data: No additional findings.  Objective: Vital Signs: LMP 12/11/2013   Physical Exam:  Constitutional: Patient appears well-developed HEENT:  Head: Normocephalic Eyes:EOM are normal Neck: Normal range of motion Cardiovascular: Normal rate Pulmonary/chest: Effort  normal Neurologic: Patient is alert Skin: Skin is warm Psychiatric: Patient has normal mood and affect  Ortho Exam: Ortho exam demonstrates right shoulder with 80 degrees of external rotation, 90 degrees abduction, 170 degrees forward flexion.  5/5 motor strength of the right supraspinatus, infraspinatus, subscapularis.  Pain worse with resisted supraspinatus testing.  No crepitus felt with passive range of motion of the shoulder.  No tenderness over the [facility] joint.  Tenderness present over the bicipital groove.  Positive Neer's impingement sign.  Right knee with no effusion present.  Tenderness over the medial lateral joint line.  Able to extend the leg to perform straight leg raise.  No pain with hip range of motion.  Specialty Comments:  No specialty comments available.  Imaging: No results found.   PMFS History: Patient Active Problem List   Diagnosis Date Noted   Bartholin gland cyst 08/27/2019   Vaginal irritation 08/27/2019   Chronic pain of right knee 08/27/2019   Chronic neck pain 08/27/2019   Fatigue 03/27/2019   Depression, major, single episode, moderate ([place]) 03/27/2019   Venous insufficiency of both lower extremities 08/11/2018   Screening for malignant neoplasm of cervix 08/11/2018   Hypercholesterolemia 11/07/2017   Coronary artery calcification seen on CT scan    DM2 (diabetes mellitus, type 2) ([place])    Plantar fasciitis of left foot 09/04/2016   Chronic right shoulder pain 03/03/2015   Paresthesia 12/01/2014   Chronic migraine without aura without status migrainosus, not intractable 11/11/2014   Morbid obesity ([place]) 11/11/2014   Chronic sinusitis 07/06/2013   Palpitations 02/18/2012   BMI 34.0-34.9,adult 08/02/2011   Chest pain 04/21/2011   Abnormal CXR 04/21/2011   Dyspnea 04/21/2011   Hypertension 02/08/2011   Vertigo, benign positional 02/08/2011   Insomnia 02/08/2011   Tuberculosis 01/02/1996   Past Medical History:  Diagnosis  Date   Allergy    Arthritis    Blood transfusion    with child birth   Blood transfusion without reported diagnosis    Coronary artery calcification seen on CT scan    Coronary CTA 9/19: Ca score = 1 (80th percentile); calcified plaque distal LM; no obstructive CAD.   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   Urinary urgency    Vertigo     Family History  Problem Relation Age of Onset   Throat cancer Father        was a smoker   Colon cancer Father    Alcohol abuse Father    Cancer Father 74       colon cancer   HIV Sister    Alcohol abuse  Mother    Diabetes Mother    Heart disease Mother    Cancer Mother    Diabetes Paternal Grandmother    Dementia Paternal Grandmother    Hypertension Paternal Grandmother    Breast cancer Paternal Grandmother    Skin cancer Paternal Grandmother    Diabetes Maternal Grandmother    Heart disease Sister 43       died age 39 "Heart was weak"   Stroke Sister    Sudden Cardiac Death Neg Hx     Past Surgical History:  Procedure Laterality Date   BREAST BIOPSY  2012   Underarm-rt-extra tissue   COLONOSCOPY  age 14 yo   Screen   ETHMOIDECTOMY Left 07/10/2013   Procedure: ETHMOIDECTOMY LEFT,MAXILLARY OSTIAL ENLARGEMENT WITH REMOVAL OF DISEASE;  Surgeon: Rozetta Nunnery, MD;  Location: [facility];  Service: ENT;  Laterality: Left;   NOSE SURGERY  2015   TUBAL LIGATION  1980s   TURBINATE REDUCTION Left 07/10/2013   Procedure: LEFT TURBINATE REDUCTION;  Surgeon:  Rozetta Nunnery, MD;  Location: [facility];  Service: ENT;  Laterality: Left;   UPPER GI ENDOSCOPY     Social History   Occupational History   Occupation: unemployed    Employer: [employer]    Comment: house keeping  Tobacco Use   Smoking status: Former Smoker    Packs/day: 0.30    Years: 15.00    Pack years: 4.50    Types: Cigarettes    Quit date: 01/01/1998    Years since quitting: 21.9   Smokeless tobacco: Never Used   Tobacco comment: "never inhaled"  Vaping Use   Vaping Use: Never used  Substance and Sexual Activity   Alcohol use: Yes    Alcohol/week: 1.0 standard drink    Types: 1 Glasses of wine per week    Comment: 1-2x per month    Drug use: No    Comment: Last crack cocaine 1998   Sexual activity: Not Currently    Birth control/protection: Surgical

## 2019-12-14 ENCOUNTER — Other Ambulatory Visit: Payer: Self-pay | Admitting: Family Medicine

## 2019-12-14 DIAGNOSIS — Z9109 Other allergy status, other than to drugs and biological substances: Secondary | ICD-10-CM

## 2019-12-14 DIAGNOSIS — M1711 Unilateral primary osteoarthritis, right knee: Secondary | ICD-10-CM

## 2019-12-14 DIAGNOSIS — M7551 Bursitis of right shoulder: Secondary | ICD-10-CM

## 2019-12-14 MED ORDER — LIDOCAINE HCL 1 % IJ SOLN
5.0000 mL | INTRAMUSCULAR | Status: AC | PRN
  Administered 2019-12-14: 09:00:00 5 mL

## 2019-12-14 MED ORDER — METHYLPREDNISOLONE ACETATE 40 MG/ML IJ SUSP
40.0000 mg | INTRAMUSCULAR | Status: AC | PRN
  Administered 2019-12-14: 09:00:00 40 mg via INTRA_ARTICULAR

## 2019-12-14 MED ORDER — HYALURONAN 88 MG/4ML IX SOSY
88.0000 mg | PREFILLED_SYRINGE | INTRA_ARTICULAR | Status: AC | PRN
  Administered 2019-12-14: 09:00:00 88 mg via INTRA_ARTICULAR

## 2019-12-14 MED ORDER — BUPIVACAINE HCL 0.5 % IJ SOLN
9.0000 mL | INTRAMUSCULAR | Status: AC | PRN
  Administered 2019-12-14: 09:00:00 9 mL via INTRA_ARTICULAR

## 2019-12-31 ENCOUNTER — Other Ambulatory Visit: Payer: Self-pay | Admitting: Family Medicine

## 2019-12-31 DIAGNOSIS — I10 Essential (primary) hypertension: Secondary | ICD-10-CM

## 2020-01-03 ENCOUNTER — Other Ambulatory Visit: Payer: Self-pay | Admitting: Family Medicine

## 2020-01-03 DIAGNOSIS — I10 Essential (primary) hypertension: Secondary | ICD-10-CM

## 2020-01-11 NOTE — Progress Notes (Signed)
    SUBJECTIVE:   CHIEF COMPLAINT / HPI:   Right shoulder injection 12/08 by Orthopedics. Went to donate plasma on 12/16 and machine malfunctioned.  She had some pain while at the donation centre but reports only lasted about 15 mins and was resolved before she left. Since that time she has not had any pain other than her chronic pain for which she has injections. Denies any fevers, weakness, numbness or tingling.     PERTINENT  PMH / PSH:  DM Type 2 HTN HLD  OBJECTIVE:   BP 110/80   Pulse 82   Ht 5\' 6"  (1.676 m)   Wt 256 lb 12.8 oz (116.5 kg)   LMP 12/11/2013   SpO2 98%   BMI 41.45 kg/m    General: Alert, no acute distress Cardio: Normal S1 and S2, RRR, no r/m/g Pulm: CTAB, normal work of breathing Right arm: Normal ROM.  No bruising, erythema or edema appreciated. Sensation normal.    ASSESSMENT/PLAN:   Right arm pain Resolved.  She can return to donating blood. Forms completed and returned to patient. Follow up as needed  DM2 (diabetes mellitus, type 2) (HCC) A1c, Lipid panel, Bmet today Follow up with PCP for Diabetic management   Healthcare maintenance Covid vaccine: Recieved Flu vaccine: Recieved Colonoscopy: due 2023 Pap: 08/20 Mammogram: ordered today HIV and Hep C screening today     Carollee Leitz, MD [place]

## 2020-01-11 NOTE — Patient Instructions (Addendum)
Thank you for coming to see me today. It was a pleasure.  Follow up with your Orthopedic specialist  Please follow-up with PCP in 1 week for diabetes management and review of blood work.  You should pay attention to your hemoglobin A1C.  It is a three month test about your average blood sugar. If the A1C is - <7.0 is great.  That is our goal for treating you. - Between 7.0 and 9.0 is not so good.  We would need to work to do better. - Above 9.0 is terrible.  You would really need to work with [place] to get it under control.    Today's A1C = 6.0  You are due for an eye exam.  Please make sure that you call your eye doctor to have this scheduled and have them fax the results to our office.  You are due for a Foot exam.  Please book appointment with PCP to have this done.  We will get some labs today.  If they are abnormal or we need to do something about them, I will call you.  If they are normal, I will send you a message on MyChart (if it is active) or a letter in the mail.  If you don't hear from [place] in 2 weeks, please call the office at the number below.  You can start donating blood again.  If you have any questions or concerns, please do not hesitate to call the office at [phone].  Best,   Carollee Leitz, MD

## 2020-01-12 ENCOUNTER — Ambulatory Visit: Payer: Medicaid Other | Admitting: Family Medicine

## 2020-01-12 ENCOUNTER — Encounter: Payer: Self-pay | Admitting: Family Medicine

## 2020-01-12 ENCOUNTER — Other Ambulatory Visit: Payer: Self-pay

## 2020-01-12 VITALS — BP 110/80 | HR 82 | Ht 66.0 in | Wt 256.8 lb

## 2020-01-12 DIAGNOSIS — Z0000 Encounter for general adult medical examination without abnormal findings: Secondary | ICD-10-CM

## 2020-01-12 DIAGNOSIS — M79601 Pain in right arm: Secondary | ICD-10-CM | POA: Diagnosis not present

## 2020-01-12 DIAGNOSIS — E119 Type 2 diabetes mellitus without complications: Secondary | ICD-10-CM | POA: Diagnosis present

## 2020-01-12 DIAGNOSIS — Z1231 Encounter for screening mammogram for malignant neoplasm of breast: Secondary | ICD-10-CM

## 2020-01-12 LAB — POCT GLYCOSYLATED HEMOGLOBIN (HGB A1C): Hemoglobin A1C: 6 % — AB (ref 4.0–5.6)

## 2020-01-13 LAB — HIV ANTIBODY (ROUTINE TESTING W REFLEX): HIV Screen 4th Generation wRfx: NONREACTIVE

## 2020-01-13 LAB — BASIC METABOLIC PANEL
BUN/Creatinine Ratio: 19 (ref 9–23)
BUN: 15 mg/dL (ref 6–24)
CO2: 23 mmol/L (ref 20–29)
Calcium: 9.7 mg/dL (ref 8.7–10.2)
Chloride: 102 mmol/L (ref 96–106)
Creatinine, Ser: 0.8 mg/dL (ref 0.57–1.00)
GFR calc Af Amer: 95 mL/min/{1.73_m2} (ref >59)
GFR calc non Af Amer: 82 mL/min/{1.73_m2} (ref >59)
Glucose: 130 mg/dL — ABNORMAL HIGH (ref 65–99)
Potassium: 4.2 mmol/L (ref 3.5–5.2)
Sodium: 140 mmol/L (ref 134–144)

## 2020-01-13 LAB — LIPID PANEL
Chol/HDL Ratio: 4.1 ratio (ref 0.0–4.4)
Cholesterol, Total: 168 mg/dL (ref 100–199)
HDL: 41 mg/dL (ref >39)
LDL Chol Calc (NIH): 111 mg/dL — ABNORMAL HIGH (ref 0–99)
Triglycerides: 86 mg/dL (ref 0–149)
VLDL Cholesterol Cal: 16 mg/dL (ref 5–40)

## 2020-01-13 LAB — HCV AB W REFLEX TO QUANT PCR: HCV Ab: <0.1 s/co ratio (ref 0.0–0.9)

## 2020-01-13 LAB — HCV INTERPRETATION

## 2020-01-16 ENCOUNTER — Encounter: Payer: Self-pay | Admitting: Family Medicine

## 2020-01-16 DIAGNOSIS — Z0000 Encounter for general adult medical examination without abnormal findings: Secondary | ICD-10-CM | POA: Insufficient documentation

## 2020-01-16 DIAGNOSIS — M79601 Pain in right arm: Secondary | ICD-10-CM | POA: Insufficient documentation

## 2020-01-16 NOTE — Assessment & Plan Note (Signed)
A1c, Lipid panel, Bmet today Follow up with PCP for Diabetic management

## 2020-01-16 NOTE — Assessment & Plan Note (Signed)
Covid vaccine: Recieved Flu vaccine: Recieved Colonoscopy: due 2023 Pap: 08/20 Mammogram: ordered today HIV and Hep C screening today

## 2020-01-16 NOTE — Assessment & Plan Note (Signed)
Resolved.  She can return to donating blood. Forms completed and returned to patient. Follow up as needed

## 2020-02-09 ENCOUNTER — Other Ambulatory Visit: Payer: Self-pay

## 2020-02-09 ENCOUNTER — Ambulatory Visit
Admission: RE | Admit: 2020-02-09 | Discharge: 2020-02-09 | Disposition: A | Discharge status: Home / Self Care | Payer: Medicaid Other | Source: Ambulatory Visit | Attending: Family Medicine | Admitting: Family Medicine

## 2020-02-09 DIAGNOSIS — Z1231 Encounter for screening mammogram for malignant neoplasm of breast: Secondary | ICD-10-CM

## 2020-02-16 ENCOUNTER — Other Ambulatory Visit: Payer: Self-pay | Admitting: Family Medicine

## 2020-02-18 ENCOUNTER — Other Ambulatory Visit: Payer: Self-pay | Admitting: Family Medicine

## 2020-02-18 DIAGNOSIS — Z9109 Other allergy status, other than to drugs and biological substances: Secondary | ICD-10-CM

## 2020-02-18 MED ORDER — FLUTICASONE PROPIONATE 50 MCG/ACT NA SUSP: 2.0000 | Freq: Every day | NASAL | 3 refills | Status: AC

## 2020-02-19 ENCOUNTER — Other Ambulatory Visit: Payer: Self-pay

## 2020-02-19 MED ORDER — ESOMEPRAZOLE MAGNESIUM 40 MG PO CPDR
40.0000 mg | DELAYED_RELEASE_CAPSULE | Freq: Every day | ORAL | 3 refills | Status: DC
Start: 2020-02-19 — End: 2020-10-05

## 2020-02-19 NOTE — Progress Notes (Deleted)
    SUBJECTIVE:   CHIEF COMPLAINT / HPI:   T2DM Current regimen: Metformin 500 mg twice daily CBGs On Lipitor 40 mg daily, last lipid panel 01/12/2020, LDL 111 Last A1c 6.0 on 01/12/2020 Last BMP also on 01/12/2020, creatinine WNL Urine microalbumin*** Foot exam*** Eye exam***  HLD Current regimen: Lipitor 40 mg daily Last lipid panel 01/12/2020, LDL 111 at that time ***  PERTINENT  PMH / PSH: ***  OBJECTIVE:   LMP 12/11/2013    Physical Exam: *** General: 60 y.o. female in NAD Cardio: RRR no m/r/g Lungs: CTAB, no wheezing, no rhonchi, no crackles, no IWOB on *** Abdomen: Soft, non-tender to palpation, non-distended, positive bowel sounds Skin: warm and dry Extremities: No edema   ASSESSMENT/PLAN:   No problem-specific Assessment & Plan notes found for this encounter.     Cleophas Dunker, [place]

## 2020-02-23 ENCOUNTER — Ambulatory Visit: Payer: Medicaid Other | Admitting: Family Medicine

## 2020-02-27 ENCOUNTER — Other Ambulatory Visit: Payer: Self-pay | Admitting: Family Medicine

## 2020-02-29 MED ORDER — HYDROCHLOROTHIAZIDE 25 MG PO TABS
25.0000 mg | ORAL_TABLET | Freq: Every day | ORAL | 0 refills | Status: DC
Start: 2020-02-29 — End: 2020-05-20

## 2020-03-02 NOTE — Progress Notes (Signed)
    SUBJECTIVE:   CHIEF COMPLAINT / HPI:   T2DM Current regimen: Metformin 500 mg twice daily CBGs 110 usually On Lipitor 40 mg daily, last lipid panel 01/12/2020, LDL 111 Last A1c 6.0 on 01/12/2020 Last BMP also on 01/12/2020, creatinine WNL Foot exam needed  Eye exam has had within the last year, about 2 weeks ago  HLD Current regimen: Lipitor 40 mg daily Last lipid panel 01/12/2020, LDL 111 at that time She has been taking lipitor and tolerating it well She would be okay to increase this  Fatigue Feels like she is very tired Doesn't want to clean her house Forces herself to do everything Has been pretty constant for awhile She is taking iron pills and vitamins, vitamin D Feels like her mood is fine Doesn't sleep well She snores when she sleeps Still feels tired when she wakes up in the morning Has OSA diagnosed, has CPAP, but doesn't use it at all, can't remember last time she used it  PERTINENT  PMH / PSH: HTN, migraines, morbid obesity, T2DM, HLD  OBJECTIVE:   BP 132/85   Pulse 87   Ht 5\' 4"  (1.626 m)   Wt 255 lb 3.2 oz (115.8 kg)   LMP 12/11/2013   SpO2 96%   BMI 43.80 kg/m    Physical Exam:  General: 60 y.o. female in NAD Lungs: Breathing comfortably on room air Skin: warm and dry Extremities: No edema  Diabetic Foot Check -  Appearance - no lesions, ulcers or calluses Skin - no unusual pallor or redness Monofilament testing - normal bilaterally  Right - Great toe, medial, central, lateral ball and posterior foot intact Left - Great toe, medial, central, lateral ball and posterior foot intact    ASSESSMENT/PLAN:   DM2 (diabetes mellitus, type 2) (HCC) A1c is well below her goal. CBGs are also at goal. We will continue with Metformin 500 mg twice daily for now, if her next check in 3 months is also this low, can consider decreasing Metformin or even discontinuing. No need for BMP today. Foot exam performed today. She had an eye exam 2 weeks  ago.  Hyperlipidemia Based on coronary artery calcification seen on prior CT, could argue that her LDL goal is less than 70, otherwise would be less than 100. Either way she is above this goal. Would like to increase her Lipitor to 80 mg daily, she agrees. We will recheck her LDL in 3 months to see if this improves.  Other fatigue Suspect that this is most likely related to her OSA and not wearing a CPAP given that she is not getting good restful sleep at night. Patient agrees to start wearing her CPAP again. With 3 months of continuous use of her CPAP, will reevaluate at her next visit to see if this is improved her fatigue. If it has not, could consider further work-up.     Cleophas Dunker, [place]

## 2020-03-03 ENCOUNTER — Ambulatory Visit: Payer: Medicaid Other | Admitting: Family Medicine

## 2020-03-03 ENCOUNTER — Other Ambulatory Visit: Payer: Self-pay

## 2020-03-03 ENCOUNTER — Encounter: Payer: Self-pay | Admitting: Family Medicine

## 2020-03-03 VITALS — BP 132/85 | HR 87 | Ht 64.0 in | Wt 255.2 lb

## 2020-03-03 DIAGNOSIS — E119 Type 2 diabetes mellitus without complications: Secondary | ICD-10-CM

## 2020-03-03 DIAGNOSIS — G4733 Obstructive sleep apnea (adult) (pediatric): Secondary | ICD-10-CM | POA: Insufficient documentation

## 2020-03-03 DIAGNOSIS — E785 Hyperlipidemia, unspecified: Secondary | ICD-10-CM | POA: Diagnosis not present

## 2020-03-03 DIAGNOSIS — R5383 Other fatigue: Secondary | ICD-10-CM | POA: Diagnosis not present

## 2020-03-03 MED ORDER — ATORVASTATIN CALCIUM 80 MG PO TABS: 80.0000 mg | ORAL_TABLET | Freq: Every day | ORAL | 3 refills | Status: DC

## 2020-03-03 NOTE — Assessment & Plan Note (Signed)
Suspect that this is most likely related to her OSA and not wearing a CPAP given that she is not getting good restful sleep at night. Patient agrees to start wearing her CPAP again. With 3 months of continuous use of her CPAP, will reevaluate at her next visit to see if this is improved her fatigue. If it has not, could consider further work-up.

## 2020-03-03 NOTE — Assessment & Plan Note (Signed)
Based on coronary artery calcification seen on prior CT, could argue that her LDL goal is less than 70, otherwise would be less than 100. Either way she is above this goal. Would like to increase her Lipitor to 80 mg daily, she agrees. We will recheck her LDL in 3 months to see if this improves.

## 2020-03-03 NOTE — Assessment & Plan Note (Signed)
A1c is well below her goal. CBGs are also at goal. We will continue with Metformin 500 mg twice daily for now, if her next check in 3 months is also this low, can consider decreasing Metformin or even discontinuing. No need for BMP today. Foot exam performed today. She had an eye exam 2 weeks ago.

## 2020-03-03 NOTE — Patient Instructions (Signed)
Thank you for coming to see me today. It was a pleasure. Today we talked about:   We will increase your lipitor to 40mg .  Use your CPAP daily.  Continue the great work with your diet!  Please follow-up with me in 3 months for cholesterol check, A1c.  If you have any questions or concerns, please do not hesitate to call the office at [phone].  Best,   [place] Constable, DO

## 2020-03-18 ENCOUNTER — Encounter: Payer: Self-pay | Admitting: Family Medicine

## 2020-03-22 NOTE — Telephone Encounter (Signed)
Called patient to discuss further. Patient reports that since myChart message, area is no longer bothering her. Provided patient with return precautions. Patient verbalizes understanding.   Talbot Grumbling, RN

## 2020-04-01 ENCOUNTER — Telehealth: Payer: Self-pay | Admitting: Orthopedic Surgery

## 2020-04-01 NOTE — Telephone Encounter (Signed)
Patient called with complaints of severe pains of right knee right hip. Dr. Marlou Sa nor PA Lurena Joiner has any opening until 4/18. Patient asking if a slot can be opened for her or can she see another doctor. Please call patient at [phone].

## 2020-04-04 NOTE — Telephone Encounter (Signed)
Scheduled as work in for this Friday

## 2020-04-08 ENCOUNTER — Ambulatory Visit (INDEPENDENT_AMBULATORY_CARE_PROVIDER_SITE_OTHER): Payer: Medicaid Other

## 2020-04-08 ENCOUNTER — Encounter: Payer: Self-pay | Admitting: Orthopedic Surgery

## 2020-04-08 ENCOUNTER — Ambulatory Visit (INDEPENDENT_AMBULATORY_CARE_PROVIDER_SITE_OTHER): Payer: Medicaid Other | Admitting: Orthopedic Surgery

## 2020-04-08 DIAGNOSIS — M79604 Pain in right leg: Secondary | ICD-10-CM

## 2020-04-08 NOTE — Progress Notes (Signed)
Office Visit Note   Patient: Brianna Hawkins           Date of Birth: Jul 20, 1960           MRN: [id number] Visit Date: 04/08/2020 Requested by: Brianna Dunker, DO [address],  [place] [address] PCP: Brianna Dunker, DO  Subjective: Chief Complaint  Patient presents with  . Right Hip - Pain  . Right Knee - Pain    HPI: Brianna Hawkins is a 60 year old patient with right hip and knee pain.  She had a right knee gel injection 12/09/2019 which gave her about 20% relief and that is now wearing off.  Pain is worse at night.  She also reports right hip and thigh pain for 2 months with no history of injury.  Hurts the worse around 6 PM at night.  Reports some groin pain but most of her pain is the lateral trochanteric region.  Difficult for her to sleep on that side.  Does describe some numbness and tingling running down the right leg but not below the knee.  Stairs are difficult for her.              ROS: All systems reviewed are negative as they relate to the chief complaint within the history of present illness.  Patient denies  fevers or chills.   Assessment & Plan: Visit Diagnoses:  1. Pain in right leg     Plan: Impression is right knee pain with known medial compartment arthritis with marginal response to gel injection.  No mechanical symptoms and no effusion today.  She is can wait on cortisone injection.  Regarding her right hip and thigh pain it is difficult to say if this is occult arthritis in the hip versus trochanteric bursitis.  Is been going on for 2 months.  Failed conservative management including stretching and medications over-the-counter along with activity modification.  Plan at this time is MRI pelvis to evaluate right hip arthritis occult versus stroke bursitis.  That will guide [place] as to where the best place to put the injection.  Back to work note provided.  Continue with ITB stretching and over-the-counter medication.  See her back after her  study  Follow-Up Instructions: Return for after MRI.   Orders:  Orders Placed This Encounter  Procedures  . XR KNEE 3 VIEW RIGHT  . XR HIP UNILAT W OR W/O PELVIS 2-3 VIEWS RIGHT  . XR Lumbar Spine 2-3 Views  . MR Pelvis w/o contrast   No orders of the defined types were placed in this encounter.     Procedures: No procedures performed   Clinical Data: No additional findings.  Objective: Vital Signs: LMP 12/11/2013   Physical Exam:   Constitutional: Patient appears well-developed HEENT:  Head: Normocephalic Eyes:EOM are normal Neck: Normal range of motion Cardiovascular: Normal rate Pulmonary/chest: Effort normal Neurologic: Patient is alert Skin: Skin is warm Psychiatric: Patient has normal mood and affect    Ortho Exam: Ortho exam demonstrates full active and passive range of motion of both knees and hips.  Not too much groin pain with internal ex rotation on the right-hand side.  Pedal pulses palpable.  No knee effusion with stable collateral cruciate ligaments bilaterally.  No other masses lymphadenopathy or skin changes noted in that knee region.  She does have significant trochanteric bursitis tenderness on the right-hand side.  She has level pelvis when standing on the right and left leg.  Mild pain with forward lateral bending  but no definite paresthesias L1 S1 bilaterally.  Specialty Comments:  No specialty comments available.  Imaging: XR HIP UNILAT W OR W/O PELVIS 2-3 VIEWS RIGHT  Result Date: 04/08/2020 AP pelvis lateral right hip reviewed.  No acute fracture.  Minimal to no degenerative joint disease in the right hip.  SI joints intact.  Remainder bony pelvis appears intact.  XR KNEE 3 VIEW RIGHT  Result Date: 04/08/2020 AP lateral merchant right knee radiographs reviewed.  Medial compartment moderate arthritis is present.  Lateral and patellofemoral compartments have mild arthritis.  Alignment is slight varus.  No acute fracture.  Patella height normal  relative to distal femur  XR Lumbar Spine 2-3 Views  Result Date: 04/08/2020 AP lateral lumbar spine radiographs reviewed.  No spondylolisthesis or compression fractures.  No significant degenerative disc disease between the vertebral bodies.  Mild facet arthritis at the L4-5 and L5-S1 region.  SI joints intact    PMFS History: Patient Active Problem List   Diagnosis Date Noted  . OSA (obstructive sleep apnea) 03/03/2020  . Other fatigue 03/27/2019  . Depression, major, single episode, moderate ([place]) 03/27/2019  . Venous insufficiency of both lower extremities 08/11/2018  . Hyperlipidemia 11/07/2017  . Coronary artery calcification seen on CT scan   . DM2 (diabetes mellitus, type 2) ([place])   . Chronic migraine without aura without status migrainosus, not intractable 11/11/2014  . Morbid obesity ([place]) 11/11/2014  . Chronic sinusitis 07/06/2013  . Abnormal CXR 04/21/2011  . Hypertension 02/08/2011  . Tuberculosis 01/02/1996   Past Medical History:  Diagnosis Date  . Allergy   . Arthritis   . Blood transfusion    with child birth  . Blood transfusion without reported diagnosis   . Coronary artery calcification seen on CT scan    Coronary CTA 9/19: Ca score = 1 (80th percentile); calcified plaque distal LM; no obstructive CAD.  Marland Kitchen DM2 (diabetes mellitus, type 2) ([place])   . GERD (gastroesophageal reflux disease)   . Headache(784.0)    migraine HAs  . Heart murmur    very mild-echo 1/15-normal  . History of echocardiogram    Echo 9/19: EF 55-60, no RWMA, normal diastolic function, trace MR/TR  . HLD (hyperlipidemia)   . Hypertension   . Migraine   . Pain, joint, shoulder region, right   . Plantar fasciitis of left foot 09/04/2016  . Substance abuse ([place])   . 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  Marland Kitchen Urinary urgency   . Vertigo     Family History  Problem Relation Age of Onset  . Throat cancer Father        was a smoker  . Colon cancer Father   . Alcohol abuse Father   . Cancer  Father 70       colon cancer  . HIV Sister   . Alcohol abuse Mother   . Diabetes Mother   . Heart disease Mother   . Cancer Mother   . Diabetes Paternal Grandmother   . Dementia Paternal Grandmother   . Hypertension Paternal Grandmother   . Breast cancer Paternal Grandmother   . Skin cancer Paternal Grandmother   . Diabetes Maternal Grandmother   . Heart disease Sister 48       died age 29 "Heart was weak"  . Stroke Sister   . Sudden Cardiac Death Neg Hx     Past Surgical History:  Procedure Laterality Date  . BREAST BIOPSY  2012   Underarm-rt-extra tissue  . COLONOSCOPY  age 61 yo   Screen  . ETHMOIDECTOMY Left 07/10/2013   Procedure: ETHMOIDECTOMY LEFT,MAXILLARY OSTIAL ENLARGEMENT WITH REMOVAL OF DISEASE;  Surgeon: Rozetta Nunnery, MD;  Location: [facility];  Service: ENT;  Laterality: Left;  . NOSE SURGERY  2015  . TUBAL LIGATION  1980s  . TURBINATE REDUCTION Left 07/10/2013   Procedure: LEFT TURBINATE REDUCTION;  Surgeon: Rozetta Nunnery, MD;  Location: [facility];  Service: ENT;  Laterality: Left;  . UPPER GI ENDOSCOPY     Social History   Occupational History  . Occupation: unemployed    Fish farm manager: [employer]    Comment: house keeping  Tobacco Use  . Smoking status: Former Smoker    Packs/day: 0.30    Years: 15.00    Pack years: 4.50    Types: Cigarettes    Quit date: 01/01/1998    Years since quitting: 22.2  . Smokeless tobacco: Never Used  . Tobacco comment: "never inhaled"  Vaping Use  . Vaping Use: Never used  Substance and Sexual Activity  . Alcohol use: Yes    Alcohol/week: 1.0 standard drink    Types: 1 Glasses of wine per week    Comment: 1-2x per month   . Drug use: No    Comment: Last crack cocaine 1998  . Sexual activity: Not Currently    Birth control/protection: Surgical

## 2020-05-06 ENCOUNTER — Ambulatory Visit: Payer: Medicaid Other | Admitting: Orthopedic Surgery

## 2020-05-12 ENCOUNTER — Ambulatory Visit (INDEPENDENT_AMBULATORY_CARE_PROVIDER_SITE_OTHER): Payer: Medicaid Other | Admitting: Orthopedic Surgery

## 2020-05-12 ENCOUNTER — Encounter: Payer: Self-pay | Admitting: Orthopedic Surgery

## 2020-05-12 ENCOUNTER — Ambulatory Visit: Payer: Self-pay

## 2020-05-12 DIAGNOSIS — M25551 Pain in right hip: Secondary | ICD-10-CM | POA: Diagnosis not present

## 2020-05-12 DIAGNOSIS — M79604 Pain in right leg: Secondary | ICD-10-CM | POA: Diagnosis not present

## 2020-05-12 NOTE — Progress Notes (Signed)
ul ?

## 2020-05-12 NOTE — Progress Notes (Signed)
Office Visit Note   Patient: Brianna Hawkins           Date of Birth: Dec 13, 1960           MRN: [id number] Visit Date: 05/12/2020 Requested by: Cleophas Dunker, DO [address],  [address] [address] PCP: Cleophas Dunker, DO  Subjective: Chief Complaint  Patient presents with  . Right Hip - Pain    HPI: Brianna Hawkins is a 60 y.o. female who presents to the office complaining of right hip pain.  Patient complains of continued right hip pain since last office visit.  She is having the same exact pain with no improvement.  Localizes pain to the lateral aspect of the right hip with some component of groin pain as well.  Lateral pain bothers her more than the groin pain.  She does note subjective limping at times.  No history of hip or back surgery.  Denies any numbness or tingling down the leg or radicular pain.  No radiation of pain.  She does have recently developed low back pain over the last 2 weeks.  Takes occasional ibuprofen for pain.  Pain wakes her up every night and she cannot lay on the right side.  She also has difficulty when she lays on her left side and her right leg falls beside her left leg.  She is currently working at [employer].  She did have an injury recently [employer] where she fell onto both of her knees.  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 injury for which she is seeing another provider..                ROS: All systems reviewed are negative as they relate to the chief complaint within the history of present illness.  Patient denies fevers or chills.  Assessment & Plan: Visit Diagnoses:  1. Greater trochanteric pain syndrome of right lower extremity   2. Pain in right leg     Plan: Patient is a 60 year old female who presents complaining of right hip pain.  She has lateral right hip pain and groin pain that is suspicious for potential occult right hip arthritis.  Most of her hip pain is lateral.  MRI was planned to further evaluate the greater  trochanter and the status of the cartilage of her right hip but MRI was denied by her insurance.  Plan to try cortisone injection into the trochanteric bursa given her predominance of lateral sided pain.  She definitely does have some groin pain that is reproducible on exam however.  She tolerated the injection well and the injection was administered under ultrasound guidance.  Plan to see patient back in 6 weeks for clinical recheck.  If no improvement at that time, consider further evaluation of her right hip joint as the true source of her discomfort.  Patient agreed with plan.  Follow-up in 6 weeks.  Follow-Up Instructions: No follow-ups on file.   Orders:  Orders Placed This Encounter  Procedures  . US Guided Needle Placement - No Linked Charges   No orders of the defined types were placed in this encounter.     Procedures: Large Joint Inj: R greater trochanter on 05/12/2020 9:10 AM Indications: pain and diagnostic evaluation Details: 18 G 3.5 in needle, ultrasound-guided lateral approach  Arthrogram: No  Medications: 5 mL lidocaine 1 %; 4 mL bupivacaine 0.25 %; 6 mg betamethasone acetate-betamethasone sodium phosphate 6 (3-3) MG/ML Outcome: tolerated well, no immediate complications Procedure, treatment alternatives, risks and  benefits explained, specific risks discussed. Consent was given by the patient. Immediately prior to procedure a time out was called to verify the correct patient, procedure, equipment, support staff and site/side marked as required. Patient was prepped and draped in the usual sterile fashion.       Clinical Data: No additional findings.  Objective: Vital Signs: LMP 12/11/2013   Physical Exam:  Constitutional: Patient appears well-developed HEENT:  Head: Normocephalic Eyes:EOM are normal Neck: Normal range of motion Cardiovascular: Normal rate Pulmonary/chest: Effort normal Neurologic: Patient is alert Skin: Skin is warm Psychiatric: Patient has  normal mood and affect  Ortho Exam: Ortho exam demonstrates right hip with moderate to severe tenderness over the greater trochanter of the right hip.  Positive Stinchfield exam.  Pain with internal rotation of the right hip.  No pain with passive hip flexion.  Negative Ober sign.    Specialty Comments:  No specialty comments available.  Imaging: No results found.   PMFS History: Patient Active Problem List   Diagnosis Date Noted  . OSA (obstructive sleep apnea) 03/03/2020  . Other fatigue 03/27/2019  . Depression, major, single episode, moderate ([facility]) 03/27/2019  . Venous insufficiency of both lower extremities 08/11/2018  . Hyperlipidemia 11/07/2017  . Coronary artery calcification seen on CT scan   . DM2 (diabetes mellitus, type 2) ([place])   . Chronic migraine without aura without status migrainosus, not intractable 11/11/2014  . Morbid obesity ([place]) 11/11/2014  . Chronic sinusitis 07/06/2013  . Abnormal CXR 04/21/2011  . Hypertension 02/08/2011  . Tuberculosis 01/02/1996   Past Medical History:  Diagnosis Date  . Allergy   . Arthritis   . Blood transfusion    with child birth  . Blood transfusion without reported diagnosis   . Coronary artery calcification seen on CT scan    Coronary CTA 9/19: Ca score = 1 (80th percentile); calcified plaque distal LM; no obstructive CAD.  Marland Kitchen DM2 (diabetes mellitus, type 2) ([facility])   . GERD (gastroesophageal reflux disease)   . Headache(784.0)    migraine HAs  . Heart murmur    very mild-echo 1/15-normal  . History of echocardiogram    Echo 9/19: EF 55-60, no RWMA, normal diastolic function, trace MR/TR  . HLD (hyperlipidemia)   . Hypertension   . Migraine   . Pain, joint, shoulder region, right   . Plantar fasciitis of left foot 09/04/2016  . Substance abuse ([place])   . 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  Marland Kitchen Urinary urgency   . Vertigo     Family History  Problem Relation Age of Onset  . Throat cancer Father        was a smoker  .  Colon cancer Father   . Alcohol abuse Father   . Cancer Father 69       colon cancer  . HIV Sister   . Alcohol abuse Mother   . Diabetes Mother   . Heart disease Mother   . Cancer Mother   . Diabetes Paternal Grandmother   . Dementia Paternal Grandmother   . Hypertension Paternal Grandmother   . Breast cancer Paternal Grandmother   . Skin cancer Paternal Grandmother   . Diabetes Maternal Grandmother   . Heart disease Sister 20       died age 80 "Heart was weak"  . Stroke Sister   . Sudden Cardiac Death Neg Hx     Past Surgical History:  Procedure Laterality Date  . BREAST BIOPSY  2012   Underarm-rt-extra  tissue  . COLONOSCOPY  age 64 yo   Screen  . ETHMOIDECTOMY Left 07/10/2013   Procedure: ETHMOIDECTOMY LEFT,MAXILLARY OSTIAL ENLARGEMENT WITH REMOVAL OF DISEASE;  Surgeon: Rozetta Nunnery, MD;  Location: [facility];  Service: ENT;  Laterality: Left;  . NOSE SURGERY  2015  . TUBAL LIGATION  1980s  . TURBINATE REDUCTION Left 07/10/2013   Procedure: LEFT TURBINATE REDUCTION;  Surgeon: Rozetta Nunnery, MD;  Location: [facility];  Service: ENT;  Laterality: Left;  . UPPER GI ENDOSCOPY     Social History   Occupational History  . 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 Smoking status: Former Smoker    Packs/day: 0.30    Years: 15.00    Pack years: 4.50    Types: Cigarettes    Quit date: 01/01/1998    Years since quitting: 22.3  . Smokeless tobacco: Never Used  . Tobacco comment: "never inhaled"  Vaping Use  . Vaping Use: Never used  Substance and Sexual Activity  . Alcohol use: Yes    Alcohol/week: 1.0 standard drink    Types: 1 Glasses of wine per week    Comment: 1-2x per month   . Drug use: No    Comment: Last crack cocaine 1998  . Sexual activity: Not Currently    Birth control/protection: Surgical

## 2020-05-14 ENCOUNTER — Encounter: Payer: Self-pay | Admitting: Orthopedic Surgery

## 2020-05-14 MED ORDER — LIDOCAINE HCL 1 % IJ SOLN
5.0000 mL | INTRAMUSCULAR | Status: AC | PRN
  Administered 2020-05-12: 5 mL

## 2020-05-14 MED ORDER — BUPIVACAINE HCL 0.25 % IJ SOLN
4.0000 mL | INTRAMUSCULAR | Status: AC | PRN
  Administered 2020-05-12: 4 mL via INTRA_ARTICULAR

## 2020-05-14 MED ORDER — BETAMETHASONE SOD PHOS & ACET 6 (3-3) MG/ML IJ SUSP
6.0000 mg | INTRAMUSCULAR | Status: AC | PRN
  Administered 2020-05-12: 6 mg via INTRA_ARTICULAR

## 2020-05-19 ENCOUNTER — Other Ambulatory Visit: Payer: Self-pay | Admitting: Family Medicine

## 2020-05-19 DIAGNOSIS — I10 Essential (primary) hypertension: Secondary | ICD-10-CM

## 2020-05-20 ENCOUNTER — Other Ambulatory Visit: Payer: Self-pay | Admitting: Family Medicine

## 2020-05-20 MED ORDER — HYDROCHLOROTHIAZIDE 25 MG PO TABS
25.0000 mg | ORAL_TABLET | Freq: Every day | ORAL | 0 refills | Status: DC
Start: 2020-05-20 — End: 2020-09-08

## 2020-06-03 ENCOUNTER — Telehealth: Payer: Self-pay | Admitting: Orthopedic Surgery

## 2020-06-03 ENCOUNTER — Other Ambulatory Visit: Payer: Self-pay | Admitting: Surgical

## 2020-06-03 MED ORDER — IBUPROFEN 800 MG PO TABS: 800.0000 mg | ORAL_TABLET | Freq: Three times a day (TID) | ORAL | 0 refills | Status: DC | PRN

## 2020-06-03 NOTE — Telephone Encounter (Signed)
Sent in rx.

## 2020-06-03 NOTE — Telephone Encounter (Signed)
Pt called asking for a rx of ibuprofen 800 mg to be called in for the pain in her knee; and she would like to be notified when this is done.   [phone]

## 2020-06-20 ENCOUNTER — Encounter (HOSPITAL_COMMUNITY): Payer: Self-pay

## 2020-06-20 ENCOUNTER — Ambulatory Visit (HOSPITAL_COMMUNITY)
Admission: EM | Admit: 2020-06-20 | Discharge: 2020-06-20 | Disposition: A | Discharge status: Home / Self Care | Payer: Medicare Other | Attending: Family Medicine | Admitting: Family Medicine

## 2020-06-20 DIAGNOSIS — I10 Essential (primary) hypertension: Secondary | ICD-10-CM | POA: Diagnosis not present

## 2020-06-20 DIAGNOSIS — R42 Dizziness and giddiness: Secondary | ICD-10-CM | POA: Diagnosis not present

## 2020-06-20 DIAGNOSIS — R110 Nausea: Secondary | ICD-10-CM | POA: Diagnosis not present

## 2020-06-20 LAB — CBG MONITORING, ED: Glucose-Capillary: 73 mg/dL (ref 70–99)

## 2020-06-20 NOTE — ED Triage Notes (Signed)
Pt presents with elevated blood pressure with some nausea and headache X 2 days.

## 2020-06-20 NOTE — Discharge Instructions (Signed)
I would recommend increasing fluids throughout the day  Check your blood pressure daily in the mornings  I think that this could be coming from your steroid injection  Follow up with this office or with primary care if symptoms are persisting.  Follow up in the ER for high fever, trouble swallowing, trouble breathing, other concerning symptoms.

## 2020-06-21 NOTE — ED Provider Notes (Signed)
[name]    CSN: [id number] Arrival date & time: 06/20/20  1421      History   Chief Complaint Chief Complaint  Patient presents with   Hypertension    HPI Brianna Hawkins is a 60 y.o. female.   Reports episodes of dizziness intermittently for the last 2 days. Reports that she had a cortisone injection for knee pain x 2 days ago. Reports BP has been elevated at home and she is concerned about this. Has medical hx type 2 DM, HTN, GERD, HLD, cardiac murmur. Denies previous symptoms. Has not attempted OTC treatment. Denies headache, blurred vision, extremity edema, decreased urine output, head inury, loss of consciousness, other symptoms.   ROS per HPI  The history is provided by the patient.  Hypertension   Past Medical History:  Diagnosis Date   Allergy    Arthritis    Blood transfusion    with child birth   Blood transfusion without reported diagnosis    Coronary artery calcification seen on CT scan    Coronary CTA 9/19: Ca score = 1 (80th percentile); calcified plaque distal LM; no obstructive CAD.   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   Urinary urgency    Vertigo     Patient Active Problem List   Diagnosis Date Noted   OSA (obstructive sleep apnea) 03/03/2020   Other fatigue 03/27/2019   Depression, major, single episode, moderate ([place]) 03/27/2019   Venous insufficiency of both lower extremities 08/11/2018   Hyperlipidemia 11/07/2017   Coronary artery calcification seen on CT scan    DM2 (diabetes mellitus, type 2) (HCC)     Chronic migraine without aura without status migrainosus, not intractable 11/11/2014   Morbid obesity ([place]) 11/11/2014   Chronic sinusitis 07/06/2013   Abnormal CXR 04/21/2011   Hypertension 02/08/2011   Tuberculosis 01/02/1996    Past Surgical History:  Procedure Laterality Date   BREAST BIOPSY  2012   Underarm-rt-extra tissue   COLONOSCOPY  age 68 yo   Screen   ETHMOIDECTOMY Left 07/10/2013   Procedure: ETHMOIDECTOMY LEFT,MAXILLARY OSTIAL ENLARGEMENT WITH REMOVAL OF DISEASE;  Surgeon: Rozetta Nunnery, MD;  Location: [place];  Service: ENT;  Laterality: Left;   NOSE SURGERY  2015   TUBAL LIGATION  1980s   TURBINATE REDUCTION Left 07/10/2013   Procedure: LEFT TURBINATE REDUCTION;  Surgeon: Rozetta Nunnery, MD;  Location: [facility];  Service: ENT;  Laterality: Left;   UPPER GI ENDOSCOPY      OB History   No obstetric history on file.      Home Medications    Prior to Admission medications   Medication Sig Start Date End Date Taking? Authorizing Provider  Truddie Crumble ULTRA-THIN LANCETS MISC Check sugars twice daily before meals 08/07/17   Mack Hook, MD  albuterol (VENTOLIN HFA) 108 (90 Base) MCG/ACT inhaler Inhale 2 puffs into the lungs every 6 (six) hours as needed for wheezing or shortness of breath. 07/21/18  Meccariello, Bernita Raisin, DO  amLODipine (NORVASC) 10 MG tablet Take 1 tablet by mouth once daily 05/20/20   Meccariello, Bernita Raisin, DO  aspirin EC 81 MG tablet Take 1 tablet (81 mg total) by mouth daily. 05/15/16   Mack Hook, MD  atorvastatin (LIPITOR) 80 MG tablet Take 1 tablet (80 mg total) by mouth daily. 03/03/20   Meccariello, Bernita Raisin, DO  Blood Glucose Monitoring Suppl (AGAMATRIX PRESTO) w/Device KIT Check sugars twice daily before meals 08/07/17   Mack Hook, MD  cetirizine (ZYRTEC) 10 MG tablet Take 1 tablet (10 mg total) by mouth daily. 11/02/19   Loura Halt A, NP  Cholecalciferol (VITAMIN D3) 50 MCG  (2000 UT) TABS Take 5,000 Units by mouth daily.     [provider]  esomeprazole (NEXIUM) 40 MG capsule Take 1 capsule (40 mg total) by mouth daily. 02/19/20   Meccariello, Bernita Raisin, DO  FLUoxetine (PROZAC) 20 MG capsule Take 1 capsule (20 mg total) by mouth daily. 03/26/19   Meccariello, Bernita Raisin, DO  fluticasone (FLONASE) 50 MCG/ACT nasal spray Place 2 sprays into both nostrils daily. 02/18/20   Meccariello, Bernita Raisin, DO  glucose blood (AGAMATRIX PRESTO TEST) test strip Check blood glucose twice daily before meals 08/07/17   Mack Hook, MD  guaiFENesin (ROBITUSSIN) 100 MG/5ML liquid Take 5-10 mLs (100-200 mg total) by mouth every 4 (four) hours as needed for cough. 11/02/19   Loura Halt A, NP  hydrochlorothiazide (HYDRODIURIL) 25 MG tablet Take 1 tablet (25 mg total) by mouth daily. 05/20/20   Meccariello, Bernita Raisin, DO  ibuprofen (ADVIL) 800 MG tablet Take 1 tablet (800 mg total) by mouth every 8 (eight) hours as needed. 06/03/20   Magnant, Charles L, PA-C  metFORMIN (GLUCOPHAGE-XR) 500 MG 24 hr tablet TAKE 1 TABLET BY MOUTH TWICE DAILY WITH MEALS 05/20/20   Meccariello, Bernita Raisin, DO  Multiple Vitamins-Minerals (HAIR/SKIN/NAILS) TABS Take 1 tablet by mouth daily.    [provider]  nitroGLYCERIN (NITROSTAT) 0.4 MG SL tablet 1 tab under tongue for chest pressure, may repeat every 5 minutes if not relieved for total of 3 doses. 08/07/17   Mack Hook, MD  rizatriptan (MAXALT) 10 MG tablet Take 10 mg by mouth as needed for migraine. May repeat in 2 hours if needed    [provider]    Family History Family History  Problem Relation Age of Onset   Throat cancer Father        was a smoker   Colon cancer Father    Alcohol abuse Father    Cancer Father 8       colon cancer   HIV Sister    Alcohol abuse Mother    Diabetes Mother    Heart disease Mother    Cancer Mother    Diabetes Paternal Grandmother    Dementia Paternal Grandmother    Hypertension  Paternal Grandmother    Breast cancer Paternal Grandmother    Skin cancer Paternal Grandmother    Diabetes Maternal Grandmother    Heart disease Sister 87       died age 38 "Heart was weak"   Stroke Sister    Sudden Cardiac Death Neg Hx     Social History Social History   Tobacco Use   Smoking status: Former    Packs/day: 0.30    Years: 15.00    Pack years: 4.50    Types: Cigarettes    Quit date: 01/01/1998    Years since quitting: 49.4  Smokeless tobacco: Never   Tobacco comments:    "never inhaled"  Vaping Use   Vaping Use: Never used  Substance Use Topics   Alcohol use: Yes    Alcohol/week: 1.0 standard drink    Types: 1 Glasses of wine per week    Comment: 1-2x per month    Drug use: No    Comment: Last crack cocaine 1998     Allergies   Patient has no known allergies.   Review of Systems Review of Systems   Physical Exam Triage Vital Signs ED Triage Vitals  Enc Vitals Group     BP 06/20/20 1632 (!) 149/88     Pulse Rate 06/20/20 1632 71     Resp 06/20/20 1632 17     Temp 06/20/20 1632 98.2 F (36.8 C)     Temp Source 06/20/20 1632 Oral     SpO2 06/20/20 1632 97 %     Weight --      Height --      Head Circumference --      Peak Flow --      Pain Score 06/20/20 1630 6     Pain Loc --      Pain Edu? --      Excl. in [place]? --    No data found.  Updated Vital Signs BP (!) 149/88 (BP Location: Left Arm)   Pulse 71   Temp 98.2 F (36.8 C) (Oral)   Resp 17   LMP 12/11/2013   SpO2 97%   Visual Acuity Right Eye Distance:   Left Eye Distance:   Bilateral Distance:    Right Eye Near:   Left Eye Near:    Bilateral Near:     Physical Exam Vitals and nursing note reviewed.  Constitutional:      General: She is not in acute distress.    Appearance: Normal appearance. She is well-developed.  HENT:     Head: Normocephalic and atraumatic.     Nose: Nose normal.     Mouth/Throat:     Mouth: Mucous membranes are moist.     Pharynx:  Oropharynx is clear.  Eyes:     Extraocular Movements: Extraocular movements intact.     Conjunctiva/sclera: Conjunctivae normal.     Pupils: Pupils are equal, round, and reactive to light.  Cardiovascular:     Rate and Rhythm: Normal rate and regular rhythm.     Heart sounds: Normal heart sounds.  Pulmonary:     Effort: Pulmonary effort is normal. No respiratory distress.     Breath sounds: No stridor. No wheezing, rhonchi or rales.  Chest:     Chest wall: No tenderness.  Musculoskeletal:        General: Normal range of motion.     Cervical back: Normal range of motion and neck supple.  Skin:    General: Skin is warm and dry.     Capillary Refill: Capillary refill takes less than 2 seconds.  Neurological:     General: No focal deficit present.     Mental Status: She is alert and oriented to person, place, and time.  Psychiatric:        Mood and Affect: Mood normal.        Behavior: Behavior normal.        Thought Content: Thought content normal.     UC Treatments / Results  Labs (all labs ordered are listed, but only abnormal results are displayed) Labs Reviewed  CBG MONITORING, ED  EKG   Radiology No results found.  Procedures Procedures (including critical care time)  Medications Ordered in UC Medications - No data to display  Initial Impression / Assessment and Plan / UC Course  I have reviewed the triage vital signs and the nursing notes.  Pertinent labs & imaging results that were available during my care of the patient were reviewed by me and considered in my medical decision making (see chart for details).    Dizziness Nausea HTN  CBG in office 73, unsure of what she usually runs, does not check blood sugars at home Discussed that steroid injections can impact blood sugar as well as blood pressure Has also been taking ibuprofen 874m, discussed that this can increase BP as well Declines labs today Monitor BP at home, try tylenol for knee pain,  increase fluid intake, follow up as needed If symptoms return or acutely worsen, follow up in the ER  Final Clinical Impressions(s) / UC Diagnoses   Final diagnoses:  Dizziness  Nausea without vomiting  Essential hypertension     Discharge Instructions      I would recommend increasing fluids throughout the day  Check your blood pressure daily in the mornings  I think that this could be coming from your steroid injection  Follow up with this office or with primary care if symptoms are persisting.  Follow up in the ER for high fever, trouble swallowing, trouble breathing, other concerning symptoms.       ED Prescriptions   None    PDMP not reviewed this encounter.   MFaustino Congress NP 06/21/20 [phone]

## 2020-06-29 ENCOUNTER — Other Ambulatory Visit: Payer: Self-pay | Admitting: Family Medicine

## 2020-06-29 MED ORDER — METFORMIN HCL ER 500 MG PO TB24
500.0000 mg | ORAL_TABLET | Freq: Two times a day (BID) | ORAL | 0 refills | Status: DC
Start: 2020-06-29 — End: 2020-08-04

## 2020-06-30 ENCOUNTER — Ambulatory Visit: Payer: Medicare Other | Admitting: Family Medicine

## 2020-06-30 NOTE — Progress Notes (Addendum)
    SUBJECTIVE:   CHIEF COMPLAINT / HPI:   Brianna Hawkins is a 60 yo F who presents for the following.   Postmenopausal bleeding Started last Wednesday with dark red blood and lasted for 1 week.  Menopausal since age of 62.  Has not had any bleeding since being menopausal.  Did receive joint injection 2 weeks prior.  Denies abnormal discharge or recent sexual activity.  Would not like to be tested for STDs at this time stated her doctor has already tested her for this.  Last Pap smear was stated to be a few months ago and normal.  Admits that she has been under a lot of stress recently, 44 year old husband health is failing.  She is his caregiver.  PERTINENT  PMH / PSH: DM  OBJECTIVE:   BP (!) 145/86   Pulse 92   Wt 252 lb 12.8 oz (114.7 kg)   LMP 12/11/2013   SpO2 97%   BMI 43.39 kg/m   General: Appears well, no acute distress. Age appropriate. Pelvic exam: VULVA: normal appearing vulva with no masses, tenderness or lesions, VAGINA: normal appearing vagina with normal color and discharge, no lesions, CERVIX: not visualized due to body habitus and speculum discomfort, UTERUS: uterus is normal size, shape, consistency and nontender, ADNEXA: normal adnexa in size, nontender and no masses, exam limited by body habitus, no palpable internal organs, exam chaperoned by Delray Alt, CMA.  ASSESSMENT/PLAN:   Postmenopausal bleeding Age of menopause 88.  Dark red blood for 1 week.  No prior history of postmenopausal bleeding.  She is not on hormone replacement therapy.  She is a known diabetic.  Last A1c 6.0.  Life stressors at home.  Pelvic exam today with normal appearing discharge, nonbloody.  Cervix not well visualized using smaller speculum due to patient discomfort. - Colposcopy in clinic scheduled 7/7 for endometrial biopsy - Obtain a transvaginal ultrasound - Follow-up pending work-up   Gerlene Fee, [place]

## 2020-06-30 NOTE — Patient Instructions (Addendum)
It was wonderful to see you today.  Please bring ALL of your medications with you to every visit.   Today we talked about:  Postmenopausal bleeding Today we did a pelvic exam. Please follow-up in couple clinic at the date below.  Please go for your ultrasound below w/ a full bladder.   Please call the clinic at [phone] if your symptoms worsen or you have any concerns. It was our pleasure to serve you.  Dr. Janus Molder

## 2020-07-01 ENCOUNTER — Ambulatory Visit (INDEPENDENT_AMBULATORY_CARE_PROVIDER_SITE_OTHER): Payer: Medicare Other | Admitting: Family Medicine

## 2020-07-01 ENCOUNTER — Other Ambulatory Visit: Payer: Self-pay

## 2020-07-01 VITALS — BP 145/86 | HR 92 | Wt 252.8 lb

## 2020-07-01 DIAGNOSIS — N950 Postmenopausal bleeding: Secondary | ICD-10-CM | POA: Insufficient documentation

## 2020-07-01 HISTORY — DX: Postmenopausal bleeding: N95.0

## 2020-07-01 NOTE — Assessment & Plan Note (Signed)
Age of menopause 75.  Dark red blood for 1 week.  No prior history of postmenopausal bleeding.  She is not on hormone replacement therapy.  She is a known diabetic.  Last A1c 6.0.  Life stressors at home.  Pelvic exam today with normal appearing discharge, nonbloody.  Cervix not well visualized using smaller speculum due to patient discomfort. - Colposcopy in clinic scheduled 7/7 for endometrial biopsy - Obtain a transvaginal ultrasound - Follow-up pending work-up

## 2020-07-07 ENCOUNTER — Other Ambulatory Visit: Payer: Self-pay

## 2020-07-07 ENCOUNTER — Ambulatory Visit (INDEPENDENT_AMBULATORY_CARE_PROVIDER_SITE_OTHER): Payer: Medicare Other | Admitting: Family Medicine

## 2020-07-07 VITALS — BP 138/76 | HR 84 | Wt 253.0 lb

## 2020-07-07 DIAGNOSIS — N950 Postmenopausal bleeding: Secondary | ICD-10-CM

## 2020-07-07 MED ORDER — DIAZEPAM 5 MG PO TABS: ORAL_TABLET | ORAL | 0 refills | Status: DC

## 2020-07-07 NOTE — Assessment & Plan Note (Signed)
Attempted endometrial biopsy but patient could not tolerate any part of the procedure.  I have referred her to gynecology for further evaluation and management.  I have also prescribed her some low-dose Valium for her procedure as I think that will likely benefit her.  She has transvaginal ultrasound scheduled tomorrow.  I discussed she may want to try value for that.  If she uses those 2 tablets tomorrow and needs more for her GYN appointment, she will let me know.  I have given her information about her gynecology appointment.  Answered all her questions.

## 2020-07-07 NOTE — Patient Instructions (Signed)
I am sorry we were unable to get your procedure completed today.  I have sent in a prescription for 2 tablets of diazepam.  It might be beneficial if you would take one tab about an hour.  Minutes before your appointment  for the procedure with the gynecologist.  There is a second tablet you can take at the time of the procedure if you want or if you need. This medicine will likely help you relax and make the procedure more comfortable.  I would not recommend driving for 12 hours after you take these.  They might help you tolerate the procedure better however and want that to be successful and cause you as little discomfort as possible.  Please also keep your appointment for your ultrasound tomorrow.  We will get back with you tomorrow afternoon or Monday regarding the results of that.  Great to meet you!  PS as we discussed, you might want to get the prescription filled and take it with you to the pelvic ultrasound tomorrow.  That may be a slightly uncomfortable procedure for you as well.  If you do end up using the tablets tomorrow, and you need some more for the next procedure, please just call my office and I will call in two more.

## 2020-07-07 NOTE — Progress Notes (Signed)
    CHIEF COMPLAINT / HPI: Here for further evaluation management of postmenopausal bleeding.  Had stopped having menstrual cycles at age 60.  She had 7 days of dark-colored blood from the vagina and was seen in our clinic on July 1.  She had a pelvic exam at the time but they were unable to do Pap smear due to intolerance of speculum.  She is here today for further evaluation and management.  She has had no more vaginal bleeding in the last 6 days.  She has felt some occasional crampy sensation in her pelvic region but this is intermittent and transient.  She is otherwise felt well.  She is not and has not been on hormone replacement therapy.  She does have diabetes mellitus and last A1c was 6.0.   PERTINENT  PMH / PSH: I have reviewed the patient's medications, allergies, past medical and surgical history, smoking status and updated in the EMR as appropriate. BMI = 43 Diabetes mellitus OSA HTN  OBJECTIVE:  BP 138/76   Pulse 84   Wt 253 lb (114.8 kg)   LMP 12/11/2013   SpO2 96%   BMI 43.43 kg/m  GENERAL: Well-developed female no acute distress GU: Externally normal female genitalia.  PROCEDURE NOTE: Patient given informed consent for endometrial biopsy.  Signed copy is in the chart.  Patient was placed in the lithotomy position and Graves speculum was used.  Patient could not tolerate speculum to be placed even in the full vaginal vault.  Procedure was terminated.  No other complications other than in completion of procedure.  ASSESSMENT / PLAN:   No problem-specific Assessment & Plan notes found for this encounter.   Dorcas Mcmurray MD

## 2020-07-08 ENCOUNTER — Ambulatory Visit (HOSPITAL_COMMUNITY)
Admission: RE | Admit: 2020-07-08 | Discharge: 2020-07-08 | Disposition: A | Discharge status: Home / Self Care | Payer: Medicare Other | Source: Ambulatory Visit | Attending: Family Medicine | Admitting: Family Medicine

## 2020-07-08 DIAGNOSIS — N950 Postmenopausal bleeding: Secondary | ICD-10-CM | POA: Diagnosis present

## 2020-07-11 ENCOUNTER — Telehealth: Payer: Self-pay | Admitting: Family Medicine

## 2020-07-11 NOTE — Telephone Encounter (Signed)
Patient notified of results. She will have follow up with OBGYN for endometrial biopsy.  Roniqua Kintz Autry-Lott, DO 07/11/2020, 12:34 PM PGY-3, [place]

## 2020-07-26 ENCOUNTER — Other Ambulatory Visit: Payer: Self-pay

## 2020-07-26 ENCOUNTER — Encounter: Payer: Self-pay | Admitting: Obstetrics

## 2020-07-26 ENCOUNTER — Other Ambulatory Visit (HOSPITAL_COMMUNITY)
Admission: RE | Admit: 2020-07-26 | Discharge: 2020-07-26 | Disposition: A | Discharge status: Home / Self Care | Payer: Medicare Other | Source: Ambulatory Visit | Attending: Obstetrics | Admitting: Obstetrics

## 2020-07-26 ENCOUNTER — Ambulatory Visit (INDEPENDENT_AMBULATORY_CARE_PROVIDER_SITE_OTHER): Payer: Medicare Other | Admitting: Obstetrics

## 2020-07-26 VITALS — BP 120/81 | HR 76 | Ht 66.0 in | Wt 251.8 lb

## 2020-07-26 DIAGNOSIS — N950 Postmenopausal bleeding: Secondary | ICD-10-CM | POA: Insufficient documentation

## 2020-07-26 DIAGNOSIS — R9389 Abnormal findings on diagnostic imaging of other specified body structures: Secondary | ICD-10-CM | POA: Diagnosis not present

## 2020-07-26 NOTE — Progress Notes (Signed)
Patient presents as a new patient to discuss postmenopausal bleeding. Her last cycle was at age 60. Her bleeding started about a month ago. She stated at that time she bled for about a week. The bleeding then returned the following week for about 3-4 days. Patient states that she was seen at her primary care for the bleeding, but they were unsuccessful with procedures. Patient was then referred to another doctor in the same practice at [facility] who was also unsuccessful. An u/s was performed. Patient would like to discuss results further.   Last pap: 08/2018 Normal Last MM: 02/2020 Normal

## 2020-07-26 NOTE — Progress Notes (Signed)
Patient ID: Brianna Hawkins, female   DOB: 1960/12/08, 60 y.o.   MRN: [id number]  Chief Complaint  Patient presents with   New Patient (Initial Visit)    HPI Brianna Hawkins is a 60 y.o. female.  Patient had 2 episodes where she saw blood on the tissue after voiding.  Denies active vaginal bleeding or pelvic pain or dysuria,  Ultrasound done and revealed endometrial thickening. HPI  Past Medical History:  Diagnosis Date   Allergy    Arthritis    Blood transfusion    with child birth   Blood transfusion without reported diagnosis    Coronary artery calcification seen on CT scan    Coronary CTA 9/19: Ca score = 1 (80th percentile); calcified plaque distal LM; no obstructive CAD.   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   Urinary urgency    Vertigo     Past Surgical History:  Procedure Laterality Date   BREAST BIOPSY  2012   Underarm-rt-extra tissue   COLONOSCOPY  age 62 yo   Screen   ETHMOIDECTOMY Left 07/10/2013   Procedure: ETHMOIDECTOMY LEFT,MAXILLARY OSTIAL ENLARGEMENT WITH REMOVAL OF DISEASE;  Surgeon: Rozetta Nunnery, MD;  Location: [facility];  Service: ENT;  Laterality: Left;   NOSE SURGERY  2015   TUBAL LIGATION  1980s   TURBINATE REDUCTION Left 07/10/2013   Procedure: LEFT TURBINATE REDUCTION;  Surgeon: Rozetta Nunnery, MD;  Location: [facility];  Service: ENT;  Laterality: Left;   UPPER GI ENDOSCOPY      Family History  Problem Relation Age of Onset   Throat cancer Father        was a smoker   Colon cancer  Father    Alcohol abuse Father    Cancer Father 18       colon cancer   HIV Sister    Alcohol abuse Mother    Diabetes Mother    Heart disease Mother    Cancer Mother    Diabetes Paternal Grandmother    Dementia Paternal Grandmother    Hypertension Paternal Grandmother    Breast cancer Paternal Grandmother    Skin cancer Paternal Grandmother    Diabetes Maternal Grandmother    Heart disease Sister 68       died age 74 "Heart was weak"   Stroke Sister    Sudden Cardiac Death Neg Hx     Social History Social History   Tobacco Use   Smoking status: Former    Packs/day: 0.30    Years: 15.00    Pack years: 4.50    Types: Cigarettes    Quit date: 01/01/1998    Years since quitting: 22.5   Smokeless tobacco: Never   Tobacco comments:    "never inhaled"  Vaping Use   Vaping Use: Never used  Substance Use Topics   Alcohol use: Yes    Alcohol/week: 1.0 standard drink    Types: 1 Glasses of wine per week  Comment: 1-2x per month    Drug use: No    Comment: Last crack cocaine 1998    No Known Allergies  Current Outpatient Medications  Medication Sig Dispense Refill   albuterol (VENTOLIN HFA) 108 (90 Base) MCG/ACT inhaler Inhale 2 puffs into the lungs every 6 (six) hours as needed for wheezing or shortness of breath. 8 g 0   amLODipine (NORVASC) 10 MG tablet Take 1 tablet by mouth once daily 90 tablet 0   aspirin EC 81 MG tablet Take 1 tablet (81 mg total) by mouth daily.     atorvastatin (LIPITOR) 80 MG tablet Take 1 tablet (80 mg total) by mouth daily. 90 tablet 3   cetirizine (ZYRTEC) 10 MG tablet Take 1 tablet (10 mg total) by mouth daily. 30 tablet 0   Cholecalciferol (VITAMIN D3) 50 MCG (2000 UT) TABS Take 5,000 Units by mouth daily.      diazepam (VALIUM) 5 MG tablet Take one by mouth an hour before procedure and may repeat X once at time of procedure is needed do not drive for 12 hours after using 2 tablet 0   esomeprazole (NEXIUM) 40 MG capsule Take 1 capsule (40  mg total) by mouth daily. 30 capsule 3   fluticasone (FLONASE) 50 MCG/ACT nasal spray Place 2 sprays into both nostrils daily. 16 g 3   hydrochlorothiazide (HYDRODIURIL) 25 MG tablet Take 1 tablet (25 mg total) by mouth daily. 90 tablet 0   ibuprofen (ADVIL) 800 MG tablet Take 1 tablet (800 mg total) by mouth every 8 (eight) hours as needed. 30 tablet 0   metFORMIN (GLUCOPHAGE-XR) 500 MG 24 hr tablet Take 1 tablet (500 mg total) by mouth 2 (two) times daily with a meal. 60 tablet 0   Multiple Vitamins-Minerals (HAIR/SKIN/NAILS) TABS Take 1 tablet by mouth daily.     nitroGLYCERIN (NITROSTAT) 0.4 MG SL tablet 1 tab under tongue for chest pressure, may repeat every 5 minutes if not relieved for total of 3 doses. 24 tablet 3   rizatriptan (MAXALT) 10 MG tablet Take 10 mg by mouth as needed for migraine. May repeat in 2 hours if needed     AGAMATRIX ULTRA-THIN LANCETS MISC Check sugars twice daily before meals (Patient not taking: Reported on 07/26/2020) 100 each 11   Blood Glucose Monitoring Suppl (AGAMATRIX PRESTO) w/Device KIT Check sugars twice daily before meals (Patient not taking: Reported on 07/26/2020) 1 kit 0   glucose blood (AGAMATRIX PRESTO TEST) test strip Check blood glucose twice daily before meals (Patient not taking: Reported on 07/26/2020) 100 each 11   No current facility-administered medications for this visit.    Review of Systems Review of Systems Constitutional: negative for fatigue and weight loss Respiratory: negative for cough and wheezing Cardiovascular: negative for chest pain, fatigue and palpitations Gastrointestinal: negative for abdominal pain and change in bowel habits Genitourinary: positive for postmenopausal bleeding Integument/breast: negative for nipple discharge Musculoskeletal:negative for myalgias Neurological: negative for gait problems and tremors Behavioral/Psych: negative for abusive relationship, depression Endocrine: negative for temperature  intolerance      Blood pressure 120/81, pulse 76, height _0  (1.676 m), weight 251 lb 12.8 oz (114.2 kg), last menstrual period 12/11/2013.  Physical Exam Physical Exam General:   Alert and no distress  Skin:   no rash or abnormalities  Lungs:   clear to auscultation bilaterally  Heart:   regular rate and rhythm, S1, S2 normal, no murmur, click, rub or gallop  Breasts:   normal without  suspicious masses, skin or nipple changes or axillary nodes  Abdomen:  normal findings: no organomegaly, soft, non-tender and no hernia  Pelvis:  External genitalia: normal general appearance Urinary system: urethral meatus normal and bladder without fullness, nontender Vaginal: normal without tenderness, induration or masses Cervix: normal appearance Adnexa: normal bimanual exam Uterus: anteverted and non-tender, normal size    I have spent a total of 25 minutes of face-to-face time, excluding clinical staff time, reviewing notes and preparing to see patient, ordering tests and/or medications, and counseling the patient.   Data Reviewed Ultrasound:    US Pelvic Complete With Transvaginal (Accession [id number]) (Order [id number]) Imaging Date: 07/08/2020 Department: [facility] Released By: Trula Slade Authorizing: McDiarmid, Blane Ohara, MD    Exam Status  Status  Final [99]   PACS Intelerad Image Link   Show images for US Pelvic Complete With Transvaginal  Study Result  Narrative & Impression  CLINICAL DATA:  Post menopausal bleeding   EXAM: TRANSABDOMINAL AND TRANSVAGINAL ULTRASOUND OF PELVIS   TECHNIQUE: Both transabdominal and transvaginal ultrasound examinations of the pelvis were performed. Transabdominal technique was performed for global imaging of the pelvis including uterus, ovaries, adnexal regions, and pelvic cul-de-sac. It was necessary to proceed with endovaginal exam following the transabdominal exam to visualize the uterus endometrium  ovaries.   COMPARISON:  CT 02/14/2009   FINDINGS: Uterus   Measurements: 8.4 x 3 x 5.3 cm = volume: 69.8 mL. No fibroids or other mass visualized.   Endometrium   Thickness: 16 mm.  Heterogenous thickening with small cystic spaces.   Right ovary   Measurements: 3.9 x 2.1 x 3.8 cm = volume: 15.9 mL. Cyst measuring 2.4 x 2 x 1.9 cm   Left ovary   Measurements: 3.3 x 1.6 x 1.3 cm = volume: 3.5 mL. Normal appearance/no adnexal mass.   Other findings   No abnormal free fluid.   IMPRESSION: 1. Endometrial thickness of 16 mm. In the setting of post-menopausal bleeding, endometrial sampling is indicated to exclude carcinoma. If results are benign, sonohysterogram should be considered for focal lesion work-up. (Ref: Radiological Reasoning: Algorithmic Workup of Abnormal Vaginal Bleeding with Endovaginal Sonography and Sonohysterography. AJR 2008; 858:I50-27) 2. 2.4 cm right ovarian cyst. No followup imaging recommended. Note: This recommendation does not apply to premenarchal patients or to those with increased risk (genetic, family history, elevated tumor markers or other high-risk factors) of ovarian cancer. Reference: Radiology 2019 Nov; 293(2):359-371.     Electronically Signed   By: Donavan Foil M.D.   On: 07/08/2020 15:09         Assessment     1. Postmenopausal bleeding Rx: - Surgical pathology  2. Endometrial thickening on ultrasound      Plan       Shelly Bombard, MD 07/26/2020 5:04 PM     Endometrial Biopsy Procedure Note  Pre-operative Diagnosis: Postmenopausal Bleeding.  Endometrial Thickening on Ultrasound  Post-operative Diagnosis: same  Indications: Postmenopausal Bleeding.  Endometrial Thickening on Ultrasound  Procedure Details   Urine pregnancy test was done in office and result was negative.  The risks (including infection, bleeding, pain, and uterine perforation) and benefits of the procedure were explained to the patient  and Written informed consent was obtained.    The patient was placed in the dorsal lithotomy position.  Bimanual exam showed the uterus to be in the neutral position.  A Graves' speculum inserted in the vagina, and the cervix prepped with povidone iodine.  Endocervical curettage  with a Kevorkian curette was not performed.   A sterile uterine sound was used to sound the uterus to a depth of 7cm.  A Pipelle endometrial aspirator was used to sample the endometrium.  Sample was sent for pathologic examination.  Condition: Stable  Complications: None  Plan:  The patient was advised to call for any fever or for prolonged or severe pain or bleeding. She was advised to use NSAID as needed for mild to moderate pain. She was advised to avoid vaginal intercourse for 48 hours or until the bleeding has completely stopped.  Attending Physician Documentation: I was present for or participated in the entire procedure, including opening and closing.     Shelly Bombard, MD 07/26/2020 5:05 PM

## 2020-07-28 LAB — SURGICAL PATHOLOGY

## 2020-08-04 ENCOUNTER — Other Ambulatory Visit: Payer: Self-pay | Admitting: Family Medicine

## 2020-08-09 ENCOUNTER — Other Ambulatory Visit: Payer: Self-pay | Admitting: Obstetrics

## 2020-08-09 ENCOUNTER — Telehealth (INDEPENDENT_AMBULATORY_CARE_PROVIDER_SITE_OTHER): Payer: Medicare Other | Admitting: Obstetrics

## 2020-08-09 ENCOUNTER — Encounter: Payer: Self-pay | Admitting: Obstetrics

## 2020-08-09 ENCOUNTER — Telehealth: Payer: Self-pay

## 2020-08-09 DIAGNOSIS — R9389 Abnormal findings on diagnostic imaging of other specified body structures: Secondary | ICD-10-CM

## 2020-08-09 DIAGNOSIS — N950 Postmenopausal bleeding: Secondary | ICD-10-CM | POA: Diagnosis not present

## 2020-08-09 MED ORDER — MEGESTROL ACETATE 40 MG PO TABS: 40.0000 mg | ORAL_TABLET | Freq: Two times a day (BID) | ORAL | 0 refills | Status: DC

## 2020-08-09 NOTE — Progress Notes (Signed)
GYNECOLOGY VIRTUAL VISIT ENCOUNTER NOTE  [facility] location: [facility]   Patient location: Home  I connected with Brianna Hawkins on 08/09/20 at  9:35 AM EDT by MyChart Video Encounter and verified that I am speaking with the correct person using two identifiers.   I discussed the limitations, risks, security and privacy concerns of performing an evaluation and management service virtually and the availability of in person appointments. I also discussed with the patient that there may be a patient responsible charge related to this service. The patient expressed understanding and agreed to proceed.   History:  Brianna Hawkins is a 60 y.o. [id number] female being evaluated today for postmenopausal bleeding and thickening of the endometrium on ultrasound.  Endometrial biopsy also done, and results will be discussed with management recommendations . She denies any abnormal vaginal discharge, bleeding, pelvic pain or other concerns.       Past Medical History:  Diagnosis Date   Allergy    Arthritis    Blood transfusion    with child birth   Blood transfusion without reported diagnosis    Coronary artery calcification seen on CT scan    Coronary CTA 9/19: Ca score = 1 (80th percentile); calcified plaque distal LM; no obstructive CAD.   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   Urinary urgency    Vertigo    Past Surgical History:  Procedure Laterality Date   BREAST BIOPSY  2012   Underarm-rt-extra tissue   COLONOSCOPY  age 52 yo   Screen    ETHMOIDECTOMY Left 07/10/2013   Procedure: ETHMOIDECTOMY LEFT,MAXILLARY OSTIAL ENLARGEMENT WITH REMOVAL OF DISEASE;  Surgeon: Rozetta Nunnery, MD;  Location: [facility];  Service: ENT;  Laterality: Left;   NOSE SURGERY  2015   TUBAL LIGATION  1980s   TURBINATE REDUCTION Left 07/10/2013   Procedure: LEFT TURBINATE REDUCTION;  Surgeon: Rozetta Nunnery, MD;  Location: [facility];  Service: ENT;  Laterality: Left;   UPPER GI ENDOSCOPY     The following portions of the patient's history were reviewed and updated as appropriate: allergies, current medications, past family history, past medical history, past social history, past surgical history and problem list.   Health Maintenance:  Normal pap and negative HRHPV on 08-11-2018.  Normal mammogram on 02-09-2020.   Review of Systems:  Pertinent items noted in HPI and remainder of comprehensive ROS otherwise negative.  Physical Exam:   General:  Alert, oriented and cooperative. Patient appears to be in no acute distress.  Mental Status: Normal mood and affect. Normal behavior. Normal judgment and thought content.   Respiratory: Normal respiratory effort, no problems with respiration noted  Rest of physical exam deferred due to type of encounter  Labs and Imaging Results for orders placed or performed in visit on 07/26/20 (from the past 336 hour(s))  Surgical pathology   Collection Time: 07/26/20  4:34 PM  Result Value Ref Range  SURGICAL PATHOLOGY      SURGICAL PATHOLOGY CASE: [id number] PATIENT: Brianna Hawkins Surgical Pathology Report     Clinical History: episode of vaginal bleeding x 2, PMB, thickened endometrium (cm)     FINAL MICROSCOPIC DIAGNOSIS:  A. ENDOMETRIUM, BIOPSY: - Benign endometrial polyp - Nonpolypoid benign inactive endometrium - Negative for hyperplasia or malignancy     GROSS DESCRIPTION:  The specimen is received in formalin and consists of a 1.8 x 1.3 x  for gross 0.3 cm aggregate of tan-brown soft tissue and mucus.  The specimen is entirely submitted in 1 cassette.  Craig Staggers 07/27/2020)    Final Diagnosis performed by Jaquita Folds, MD.   Electronically signed 07/28/2020 Technical component performed at [facility], [place] [address] [place], [place] [address].  Professional component performed at [facility], [place] [address] [place], [place] [address].  Immunohistochemistry Technical component (if applicable) was performed at [facility]. [address] [place], [place], [place] [address].   IMMUNOHISTOCHEMISTRY DISCLAIMER (if applicable): Some of these immunohistochemical stains may have been developed and the performance characteristics determine by [facility]. Some may not have been cleared or approved by the U.S. Food and Drug Administration. The FDA has determined that such clearance or approval is not necessary. This test is used for clinical purposes. It should not be regarded as investigational or for research. This laboratory is certified under the [place] (CLIA-88) as qualified to perform high complexity clinical laboratory testing.  The controls stained appropriately.    No results found.     Assessment and Plan:     1. Postmenopausal bleeding - consultation with Dr. Rip Harbour for possible surgical management scheduled  2. Endometrial thickening on ultrasound Rx: - [place] Sonohysterogram; Future - megestrol (MEGACE) 40 MG tablet; Take 1 tablet (40 mg total) by mouth 2 (two) times daily.  Dispense: 60 tablet; Refill: 0       I discussed the assessment and treatment plan with the patient. The patient was provided an opportunity to ask questions and all were answered. The patient agreed with the plan and demonstrated an understanding of the instructions.   The patient was advised to call back or seek an in-person  evaluation/go to the ED if the symptoms worsen or if the condition fails to improve as anticipated.  I have spent a total of 15 minutes of non face-to-face time, excluding clinical staff time, reviewing notes and preparing to see patient, ordering tests and/or medications, and counseling the patient.    Baltazar Najjar, MD [facility]  08/09/20

## 2020-08-09 NOTE — Telephone Encounter (Signed)
Pt called and LVM she had a lot of unanswered questions pt just had visit w/ provider. Provider notified and will contact pt. Per Dr.Harper he did leave vm earlier to try to call pt back however will try again.

## 2020-08-18 NOTE — Progress Notes (Signed)
   PATIENT: Brianna Hawkins DOB: 09-21-1960  REASON FOR VISIT: follow up HISTORY FROM: patient  Virtual Visit via Telephone Note  I connected with Brianna Hawkins on 08/18/20 at  8:15 AM EDT by telephone and verified that I am speaking with the correct person using two identifiers.   I discussed the limitations, risks, security and privacy concerns of performing an evaluation and management service by telephone and the availability of in person appointments. I also discussed with the patient that there may be a patient responsible charge related to this service. The patient expressed understanding and agreed to proceed.   History of Present Illness:  08/18/20 ALL: Brianna Hawkins is a 60 y.o. female here today for follow up for OSA previously on CPAP therapy. She has not used CPAP recently.   08/19/19 ALL:  Brianna Hawkins is a 60 y.o. female here today for follow up for recently diagnosed OSA started on CPAP. Sleep study on 05/03/2019 "demonstrates overall mild obstructive sleep apnea,  moderate during REM sleep with a total AHI of 7.6/hour, REM AHI of 17.3/hour, and O2 nadir of 80%. She reports feeling significantly better since starting CPAP. She is sleeping much better. She feels moe energized. She has had an occasional headache in the mornings but not sure it is correlated. She reports that her husband will wake her up at night if he is unable to sleep. She is pleased with CPAP therapy.   Compliance report dated 07/19/2019 through 08/17/2019 reveals that she used CPAP therapy 30 of the past 30 days for compliance of 100%.  She is CPAP greater than 4 hours all 30 days.  Average usage was 6 hours and 18 minutes.  Residual AHI was 1.4 on 5 to 11 cm of water and an EPR of 3.  There was no significant leak noted.   Observations/Objective:  Generalized: Well developed, in no acute distress  Mentation: Alert oriented to time, place, history taking. Follows all commands speech and language  fluent   Assessment and Plan:  60 y.o. year old female  has a past medical history of Allergy, Arthritis, Blood transfusion, Blood transfusion without reported diagnosis, Coronary artery calcification seen on CT scan, DM2 (diabetes mellitus, type 2) ([place]), GERD (gastroesophageal reflux disease), Headache(784.0), Heart murmur, History of echocardiogram, HLD (hyperlipidemia), Hypertension, Migraine, Pain, joint, shoulder region, right, Plantar fasciitis of left foot (09/04/2016), Substance abuse ([place]), Tuberculosis (1998), Urinary urgency, and Vertigo. here with  No diagnosis found.  No orders of the defined types were placed in this encounter.   No orders of the defined types were placed in this encounter.    Follow Up Instructions:  I discussed the assessment and treatment plan with the patient. The patient was provided an opportunity to ask questions and all were answered. The patient agreed with the plan and demonstrated an understanding of the instructions.   The patient was advised to call back or seek an in-person evaluation if the symptoms worsen or if the condition fails to improve as anticipated.  I provided  minutes of non-face-to-face time during this encounter. Patient located at their place of residence during [place] visit. Provider is in the office.    Debbora Presto, NP

## 2020-08-18 NOTE — Patient Instructions (Signed)

## 2020-08-19 ENCOUNTER — Other Ambulatory Visit: Payer: Self-pay

## 2020-08-19 ENCOUNTER — Encounter: Payer: Self-pay | Admitting: Obstetrics and Gynecology

## 2020-08-19 ENCOUNTER — Ambulatory Visit (INDEPENDENT_AMBULATORY_CARE_PROVIDER_SITE_OTHER): Payer: Medicare Other | Admitting: Obstetrics and Gynecology

## 2020-08-19 VITALS — BP 116/79 | HR 75 | Ht 66.0 in | Wt 248.0 lb

## 2020-08-19 DIAGNOSIS — N950 Postmenopausal bleeding: Secondary | ICD-10-CM | POA: Diagnosis not present

## 2020-08-19 NOTE — Patient Instructions (Signed)
Hysteroscopy Hysteroscopy is a procedure used to look inside a woman's womb (uterus). This may be done for various reasons, including: To look for tumors and other growths in the uterus. To evaluate abnormal bleeding, fibroid tumors, polyps, scar tissue, or uterine cancer. To determine why a woman is unable to get pregnant or has had repeated pregnancy losses. To locate an IUD (intrauterine device). To place a birth control device into the fallopian tubes. During this procedure, a thin, flexible tube with a small light and camera (hysteroscope) is used to examine the uterus. The camera sends images to a monitor in the room so that your health care provider can view the inside of your uterus. Ahysteroscopy should be done right after a menstrual period. Tell a health care provider about: Any allergies you have. All medicines you are taking, including vitamins, herbs, eye drops, creams, and over-the-counter medicines. Any problems you or family members have had with anesthetic medicines. Any blood disorders you have. Any surgeries you have had. Any medical conditions you have. Whether you are pregnant or may be pregnant. Whether you have been diagnosed with an STI (sexually transmitted infection) or you think you have an STI. What are the risks? Generally, this is a safe procedure. However, problems may occur, including: Excessive bleeding. Infection. Damage to the uterus or other structures or organs. Allergic reaction to medicines or fluids that are used in the procedure. What happens before the procedure? Staying hydrated Follow instructions from your health care provider about hydration, which may include: Up to 2 hours before the procedure - you may continue to drink clear liquids, such as water, clear fruit juice, black coffee, and plain tea. Eating and drinking restrictions Follow instructions from your health care provider about eating and drinking, which may include: 8 hours before  the procedure - stop eating solid foods and drink clear liquids only. 2 hours before the procedure - stop drinking clear liquids. Medicines Ask your health care provider about: Changing or stopping your regular medicines. This is especially important if you are taking diabetes medicines or blood thinners. Taking medicines such as aspirin and ibuprofen. These medicines can thin your blood. Do not take these medicines unless your health care provider tells you to take them. Taking over-the-counter medicines, vitamins, herbs, and supplements. Medicine may be placed in your cervix the day before the procedure. This medicine causes the cervix to open (dilate). The larger opening makes it easier for the hysteroscope to be inserted into the uterus during the procedure. General instructions Ask your health care provider: What steps will be taken to help prevent infection. These steps may include: Washing skin with a germ-killing soap. Taking antibiotic medicine. Do not use any products that contain nicotine or tobacco for at least 4 weeks before the procedure. These products include cigarettes, chewing tobacco, and vaping devices, such as e-cigarettes. If you need help quitting, ask your health care provider. Plan to have a responsible adult take you home from the hospital or clinic. Plan to have a responsible adult care for you for the time you are told after you leave the hospital or clinic. This is important. Empty your bladder before the procedure begins. What happens during the procedure? An IV will be inserted into one of your veins. You may be given: A medicine to help you relax (sedative). A medicine that numbs the area around the cervix (local anesthetic). A medicine to make you fall asleep (general anesthetic). A hysteroscope will be inserted through your vagina and   will be inserted into one of your veins.  You may be given:  A medicine to help you relax (sedative).  A medicine that numbs the area around the cervix (local anesthetic).  A medicine to make you fall asleep (general anesthetic).  A hysteroscope will be inserted through your vagina and into your uterus.  Air or fluid will be used to enlarge your uterus to allow your health care provider to see it better.  The amount of fluid used will be carefully checked throughout the procedure.  In some cases, tissue may be gently scraped from inside the uterus and sent to a lab for testing (biopsy).  The procedure may vary among health care providers and hospitals.  What can I expect after the procedure?  Your blood pressure, heart rate, breathing rate, and blood oxygen level will be monitored until you leave the hospital or clinic.  You may have cramps. You may be given medicines for this.  You may have bleeding, which may vary from light spotting to menstrual-like bleeding. This is normal.  If you had a biopsy, it is up to you to get the results. Ask your health care provider, or the department that is doing the procedure, when your results will be ready.  Follow these instructions at home:  Activity  Rest as told by your health care provider.  Return to your normal activities as told by your health care provider. Ask your health care provider what activities are safe for you.  If you were given a sedative during the procedure, it can affect you for several hours. Do not drive or operate machinery until your health care provider says that it is safe.  Medicines  Do not take aspirin or other NSAIDs during recovery, as told by your healthcare provider. It can increase the risk of bleeding.  Ask your health care provider if the medicine prescribed to you:  Requires you to avoid driving or using machinery.  Can cause constipation. You may need to take these actions to prevent or treat constipation:  Drink enough fluid to keep your urine pale yellow.  Take over-the-counter or prescription medicines.  Eat foods that are high in fiber, such as beans, whole grains, and fresh fruits and vegetables.  Limit foods that are high in fat and processed sugars, such as fried or sweet foods.  General instructions  Do not douche, use tampons, or have sex for 2 weeks after the procedure, or until your health care provider approves.  Do not take  baths, swim, or use a hot tub until your health care provider approves. Take showers instead of baths for 2 weeks, or for as long as told by your health care provider.  Keep all follow-up visits. This is important.  Contact a health care provider if:  You feel dizzy or lightheaded.  You feel nauseous.  You have abnormal vaginal discharge.  You have a rash.  You have pain that does not get better with medicine.  You have chills.  Get help right away if:  You have bleeding that is heavier than a normal menstrual period.  You have a fever.  You have pain or cramps that get worse.  You develop new abdominal pain.  You faint.  You have pain in your shoulder.  You are short of breath.  Summary  Hysteroscopy is a procedure that is used to look inside a woman's womb (uterus).  After the procedure, you may have bleeding, which varies from light spotting to

## 2020-08-19 NOTE — Progress Notes (Signed)
Consult PMB, scheduled for sono-hystogram 09/08/20.

## 2020-08-19 NOTE — Progress Notes (Signed)
Brianna Hawkins presents in referral from Dr. Jodi Mourning for eval of PMB Pt had several episodes of spotting when she wipe over the last month. It has now stop. Started her Megace yesterday EMBX polyp and benign endometrium  U/S prior to [facility] thicken endometrium  Not sexual active Mammogram UTD   PE AF VSS Lungs clear Heart RRR Abd soft + BS   A/P PMB        Thicken endometrium.polyp  Dicussed Tx options with pt. Pt desires Hysteroscopy D & C. R/B/Post op care reviewed. Information provided. Will continue with Megace. F/U with post op appt.

## 2020-08-22 ENCOUNTER — Telehealth: Payer: Self-pay

## 2020-08-22 NOTE — Telephone Encounter (Signed)
Called and VM full. Called in reference to CPAP f/u, for tomorrow with Amy,NP looks like pt has not been using CPAP for months. Calling to see if we need to keep and f/u or cancel appt.

## 2020-08-23 ENCOUNTER — Encounter: Payer: Medicare Other | Admitting: Family Medicine

## 2020-08-23 DIAGNOSIS — Z9989 Dependence on other enabling machines and devices: Secondary | ICD-10-CM

## 2020-08-23 DIAGNOSIS — G4733 Obstructive sleep apnea (adult) (pediatric): Secondary | ICD-10-CM

## 2020-09-01 ENCOUNTER — Telehealth: Payer: Self-pay | Admitting: *Deleted

## 2020-09-01 NOTE — Telephone Encounter (Signed)
Call to Patient to review surgery options. Discussed post op recovery and no driving for 24 hours.  Prefers a Tuesday but no specific date needs. Advised will call back when confirmed.

## 2020-09-08 ENCOUNTER — Other Ambulatory Visit: Payer: Self-pay | Admitting: Family Medicine

## 2020-09-08 ENCOUNTER — Other Ambulatory Visit: Payer: Medicare Other

## 2020-09-08 DIAGNOSIS — I10 Essential (primary) hypertension: Secondary | ICD-10-CM

## 2020-09-14 NOTE — Progress Notes (Deleted)
   PATIENT: Brianna Hawkins DOB: 1960/05/14  REASON FOR VISIT: follow up HISTORY FROM: patient  Virtual Visit via Telephone Note  I connected with Brianna Hawkins on 09/14/20 at  7:45 AM EDT by telephone and verified that I am speaking with the correct person using two identifiers.   I discussed the limitations, risks, security and privacy concerns of performing an evaluation and management service by telephone and the availability of in person appointments. I also discussed with the patient that there may be a patient responsible charge related to this service. The patient expressed understanding and agreed to proceed.   History of Present Illness:  09/14/20 ALL ([place]): Brianna Hawkins is a 60 y.o. female here today for follow up. She has not used CPAP recently.     08/19/19 ALL:  Brianna Hawkins is a 60 y.o. female here today for follow up for recently diagnosed OSA started on CPAP. Sleep study on 05/03/2019 "demonstrates overall mild obstructive sleep apnea,  moderate during REM sleep with a total AHI of 7.6/hour, REM AHI of 17.3/hour, and O2 nadir of 80%. She reports feeling significantly better since starting CPAP. She is sleeping much better. She feels moe energized. She has had an occasional headache in the mornings but not sure it is correlated. She reports that her husband will wake her up at night if he is unable to sleep. She is pleased with CPAP therapy.    Compliance report dated 07/19/2019 through 08/17/2019 reveals that she used CPAP therapy 30 of the past 30 days for compliance of 100%.  She is CPAP greater than 4 hours all 30 days.  Average usage was 6 hours and 18 minutes.  Residual AHI was 1.4 on 5 to 11 cm of water and an EPR of 3.  There was no significant leak noted.   Observations/Objective:  Generalized: Well developed, in no acute distress  Mentation: Alert oriented to time, place, history taking. Follows all commands speech and language  fluent   Assessment and Plan:  60 y.o. year old female  has a past medical history of Allergy, Arthritis, Blood transfusion, Blood transfusion without reported diagnosis, Coronary artery calcification seen on CT scan, DM2 (diabetes mellitus, type 2) (Brianna Hawkins), GERD (gastroesophageal reflux disease), Headache(784.0), Heart murmur, History of echocardiogram, HLD (hyperlipidemia), Hypertension, Migraine, Pain, joint, shoulder region, right, Plantar fasciitis of left foot (09/04/2016), Substance abuse ([place]), Tuberculosis (1998), Urinary urgency, and Vertigo. here with  No diagnosis found.  Brianna Hawkins has not used CPAP recently.   No orders of the defined types were placed in this encounter.   No orders of the defined types were placed in this encounter.    Follow Up Instructions:  I discussed the assessment and treatment plan with the patient. The patient was provided an opportunity to ask questions and all were answered. The patient agreed with the plan and demonstrated an understanding of the instructions.   The patient was advised to call back or seek an in-person evaluation if the symptoms worsen or if the condition fails to improve as anticipated.  I provided *** minutes of non-face-to-face time during this encounter. Patient located at their place of residence during [place] visit. Provider is in the office.    Debbora Presto, NP

## 2020-09-15 ENCOUNTER — Telehealth: Payer: Medicare Other | Admitting: Family Medicine

## 2020-09-15 DIAGNOSIS — G4733 Obstructive sleep apnea (adult) (pediatric): Secondary | ICD-10-CM

## 2020-09-16 ENCOUNTER — Encounter: Payer: Self-pay | Admitting: *Deleted

## 2020-09-16 NOTE — Telephone Encounter (Signed)
Call to patient. Voice mail message confirms "Brianna Hawkins." Left message with surgery date, time location as previously discussed. Letter to follow in mail

## 2020-09-28 NOTE — H&P (Signed)
Brianna Hawkins is an 60 y.o. female with PMB. Thicken endometrium and probable endometrial polyp on U/S.    Menstrual History: Menarche age: 50 Patient's last menstrual period was 12/11/2013.    Past Medical History:  Diagnosis Date   Allergy    Arthritis    Blood transfusion    with child birth   Blood transfusion without reported diagnosis    Coronary artery calcification seen on CT scan    Coronary CTA 9/19: Ca score = 1 (80th percentile); calcified plaque distal LM; no obstructive CAD.   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   Urinary urgency    Vertigo     Past Surgical History:  Procedure Laterality Date   BREAST BIOPSY  2012   Underarm-rt-extra tissue   COLONOSCOPY  age 70 yo   Screen   ETHMOIDECTOMY Left 07/10/2013   Procedure: ETHMOIDECTOMY LEFT,MAXILLARY OSTIAL ENLARGEMENT WITH REMOVAL OF DISEASE;  Surgeon: Rozetta Nunnery, MD;  Location: [facility];  Service: ENT;  Laterality: Left;   NOSE SURGERY  2015   TUBAL LIGATION  1980s   TURBINATE REDUCTION Left 07/10/2013   Procedure: LEFT TURBINATE REDUCTION;  Surgeon: Rozetta Nunnery, MD;  Location: [facility];  Service: ENT;  Laterality: Left;   UPPER GI ENDOSCOPY      Family History  Problem Relation Age of Onset   Throat cancer Father        was a smoker   Colon cancer Father    Alcohol abuse Father    Cancer Father 32       colon cancer   HIV Sister    Alcohol abuse Mother    Diabetes Mother    Heart disease Mother    Cancer Mother    Diabetes  Paternal Grandmother    Dementia Paternal Grandmother    Hypertension Paternal Grandmother    Breast cancer Paternal Grandmother    Skin cancer Paternal Grandmother    Diabetes Maternal Grandmother    Heart disease Sister 24       died age 26 "Heart was weak"   Stroke Sister    Sudden Cardiac Death Neg Hx     Social History:  reports that she quit smoking about 22 years ago. Her smoking use included cigarettes. She has a 4.50 pack-year smoking history. She has never used smokeless tobacco. She reports current alcohol use of about 1.0 standard drink per week. She reports that she does not use drugs.  Allergies: No Known Allergies  No medications prior to admission.    Review of Systems  Constitutional: Negative.   Respiratory: Negative.    Cardiovascular: Negative.   Gastrointestinal: Negative.   Genitourinary:  Positive for menstrual problem.   Last menstrual period 12/11/2013. Physical Exam Constitutional:      Appearance: Normal appearance.  Cardiovascular:     Rate and Rhythm: Normal rate and regular rhythm.  Pulmonary:     Effort: Pulmonary effort is normal.  Breath sounds: Normal breath sounds.  Abdominal:     General: Bowel sounds are normal.     Palpations: Abdomen is soft.  Genitourinary:    Comments: Deferred to OR Neurological:     Mental Status: She is alert.    No results found for this or any previous visit (from the past 24 hour(s)).  No results found.  Assessment/Plan: PMB Thicken endometrium on U/S Probable endometrial polyp   Hysteroscopy and D & C has been recommend to pt. R/B/Post care reviewed. Pt has verbalized understanding and desires to proceed.   [name] 09/28/2020, 10:42 AM

## 2020-09-29 ENCOUNTER — Other Ambulatory Visit: Payer: Self-pay

## 2020-09-29 ENCOUNTER — Encounter (HOSPITAL_BASED_OUTPATIENT_CLINIC_OR_DEPARTMENT_OTHER): Payer: Self-pay | Admitting: Obstetrics and Gynecology

## 2020-09-29 NOTE — Progress Notes (Addendum)
ADDENDUM: Chart reviewed by anesthesia, Dr Kalman Shan MDA, ok to proceed.   Spoke w/ via phone for pre-op interview--- pt Lab needs dos----  no             Lab results------ pt getting lab done, CBC/ BMP/ EKG on 10-03-2020 COVID test -----patient states asymptomatic no test needed  Arrive at ------- 0630 on 10-05-2020 NPO after MN NO Solid Food.  Clear liquids from MN until--- 0530 Med rec completed Medications to take morning of surgery ----- nexium, norvasc, zyrtec, megestrol Diabetic medication ----- do not take metformin morning of surgery  Patient instructed no nail polish to be worn day of surgery Patient instructed to bring photo id and insurance card day of surgery Patient aware to have Driver (ride ) / caregiver  for 24 hours after surgery -- son, mike Beamon Patient Special Instructions ----- asked to bring rescue inhaler with her dos Pre-Op special Istructions ----- n/a  Patient verbalized understanding of instructions that were given at this phone interview. Patient denies shortness of breath, chest pain, fever, cough at this phone interview.    Anesthesia Review:  HTN;  hx TB, per pt treated in 1998;  pt stated SOB with any exertion , using inhaler recovers quickly;  mild OSA  has cpap but has not used in past month due to moving to different home, advised to start back using cpap starting now until surgery.  DM2;  pt stated last chest pain took two nitro approx 06/ 2022, denies any other symptom.  PCP: Dr Vivianne Master ([place] 07-07-2020 epic) Cardiologist : Dr Cathie Olden Cassell Clement 09-25-2019 epic) Pulmonology:  Dr Melvyn Novas ([place] 11-11-2014 epic) Chest x-ray : 11-02-2019 epic EKG : 09-25-2019 epic Echo : 03-26-2019 epic CCTA:  [phone] epic Event monitor:  per cardiology note 03/ 2014 NSR Stress test: no Cardiac Cath :  no Activity level:  DOE w/ any activity Sleep Study/ CPAP : Yes/ see above note Fasting Blood Sugar :  unsure    / Checks Blood Sugar -- times a day:  twice wkly in  am Blood Thinner/ Instructions /Last Dose: no ASA / Instructions/ Last Dose :  ASA 81mg 

## 2020-10-03 ENCOUNTER — Encounter (HOSPITAL_COMMUNITY)
Admission: RE | Admit: 2020-10-03 | Discharge: 2020-10-03 | Disposition: A | Discharge status: Home / Self Care | Payer: Medicare Other | Source: Ambulatory Visit | Attending: Obstetrics and Gynecology | Admitting: Obstetrics and Gynecology

## 2020-10-03 DIAGNOSIS — Z01818 Encounter for other preprocedural examination: Secondary | ICD-10-CM | POA: Insufficient documentation

## 2020-10-03 LAB — CBC
HCT: 35.2 % — ABNORMAL LOW (ref 36.0–46.0)
Hemoglobin: 11.5 g/dL — ABNORMAL LOW (ref 12.0–15.0)
MCH: 28.8 pg (ref 26.0–34.0)
MCHC: 32.7 g/dL (ref 30.0–36.0)
MCV: 88.2 fL (ref 80.0–100.0)
Platelets: 304 10*3/uL (ref 150–400)
RBC: 3.99 MIL/uL (ref 3.87–5.11)
RDW: 14.7 % (ref 11.5–15.5)
WBC: 7.1 10*3/uL (ref 4.0–10.5)
nRBC: 0 % (ref 0.0–0.2)

## 2020-10-03 LAB — BASIC METABOLIC PANEL
Anion gap: 10 (ref 5–15)
BUN: 15 mg/dL (ref 6–20)
CO2: 24 mmol/L (ref 22–32)
Calcium: 10 mg/dL (ref 8.9–10.3)
Chloride: 109 mmol/L (ref 98–111)
Creatinine, Ser: 0.71 mg/dL (ref 0.44–1.00)
GFR, Estimated: >60 mL/min (ref >60)
Glucose, Bld: 106 mg/dL — ABNORMAL HIGH (ref 70–99)
Potassium: 3.3 mmol/L — ABNORMAL LOW (ref 3.5–5.1)
Sodium: 143 mmol/L (ref 135–145)

## 2020-10-04 ENCOUNTER — Other Ambulatory Visit: Payer: Self-pay | Admitting: Family Medicine

## 2020-10-05 ENCOUNTER — Ambulatory Visit (HOSPITAL_BASED_OUTPATIENT_CLINIC_OR_DEPARTMENT_OTHER): Payer: Medicare Other | Admitting: Certified Registered Nurse Anesthetist

## 2020-10-05 ENCOUNTER — Encounter (HOSPITAL_BASED_OUTPATIENT_CLINIC_OR_DEPARTMENT_OTHER): Payer: Self-pay | Admitting: Obstetrics and Gynecology

## 2020-10-05 ENCOUNTER — Ambulatory Visit (HOSPITAL_BASED_OUTPATIENT_CLINIC_OR_DEPARTMENT_OTHER)
Admission: RE | Admit: 2020-10-05 | Discharge: 2020-10-05 | Disposition: A | Discharge status: Home / Self Care | Payer: Medicare Other | Source: Ambulatory Visit | Attending: Obstetrics and Gynecology | Admitting: Obstetrics and Gynecology

## 2020-10-05 ENCOUNTER — Encounter (HOSPITAL_BASED_OUTPATIENT_CLINIC_OR_DEPARTMENT_OTHER)
Admission: RE | Disposition: A | Discharge status: Home / Self Care | Payer: Self-pay | Source: Ambulatory Visit | Attending: Obstetrics and Gynecology

## 2020-10-05 DIAGNOSIS — N840 Polyp of corpus uteri: Secondary | ICD-10-CM | POA: Diagnosis not present

## 2020-10-05 DIAGNOSIS — E119 Type 2 diabetes mellitus without complications: Secondary | ICD-10-CM | POA: Insufficient documentation

## 2020-10-05 DIAGNOSIS — N950 Postmenopausal bleeding: Secondary | ICD-10-CM | POA: Diagnosis present

## 2020-10-05 DIAGNOSIS — Z87891 Personal history of nicotine dependence: Secondary | ICD-10-CM | POA: Insufficient documentation

## 2020-10-05 HISTORY — DX: Other psychoactive substance abuse, in remission: F19.11

## 2020-10-05 HISTORY — DX: Postmenopausal bleeding: N95.0

## 2020-10-05 HISTORY — PX: HYSTEROSCOPY WITH D & C: SHX1775

## 2020-10-05 HISTORY — DX: Other forms of dyspnea: R06.09

## 2020-10-05 HISTORY — DX: Unspecified osteoarthritis, unspecified site: M19.90

## 2020-10-05 HISTORY — DX: Dyspnea, unspecified: R06.00

## 2020-10-05 LAB — GLUCOSE, CAPILLARY: Glucose-Capillary: 124 mg/dL — ABNORMAL HIGH (ref 70–99)

## 2020-10-05 SURGERY — DILATATION AND CURETTAGE /HYSTEROSCOPY
Anesthesia: General | Site: Uterus

## 2020-10-05 MED ORDER — PROPOFOL 10 MG/ML IV BOLUS
INTRAVENOUS | Status: DC | PRN
  Administered 2020-10-05: 200 mg via INTRAVENOUS

## 2020-10-05 MED ORDER — FENTANYL CITRATE (PF) 100 MCG/2ML IJ SOLN
INTRAMUSCULAR | Status: DC | PRN
  Administered 2020-10-05 (×2): 50 ug via INTRAVENOUS

## 2020-10-05 MED ORDER — LIDOCAINE 2% (20 MG/ML) 5 ML SYRINGE
INTRAMUSCULAR | Status: DC | PRN
  Administered 2020-10-05: 80 mg via INTRAVENOUS

## 2020-10-05 MED ORDER — OXYCODONE HCL 5 MG PO TABS: 5.0000 mg | ORAL_TABLET | Freq: Once | ORAL | Status: DC | PRN

## 2020-10-05 MED ORDER — PROPOFOL 10 MG/ML IV BOLUS
INTRAVENOUS | Status: AC
  Filled 2020-10-05: qty 20

## 2020-10-05 MED ORDER — OXYCODONE HCL 5 MG PO TABS: 5.0000 mg | ORAL_TABLET | Freq: Four times a day (QID) | ORAL | 0 refills | Status: DC | PRN

## 2020-10-05 MED ORDER — MIDAZOLAM HCL 2 MG/2ML IJ SOLN
INTRAMUSCULAR | Status: DC | PRN
  Administered 2020-10-05: 2 mg via INTRAVENOUS

## 2020-10-05 MED ORDER — ONDANSETRON HCL 4 MG/2ML IJ SOLN
INTRAMUSCULAR | Status: DC | PRN
  Administered 2020-10-05: 4 mg via INTRAVENOUS

## 2020-10-05 MED ORDER — KETOROLAC TROMETHAMINE 15 MG/ML IJ SOLN
15.0000 mg | INTRAMUSCULAR | Status: AC
Start: 2020-10-05 — End: 2020-10-05
  Administered 2020-10-05: 15 mg via INTRAVENOUS

## 2020-10-05 MED ORDER — OXYCODONE HCL 5 MG/5ML PO SOLN: 5.0000 mg | Freq: Once | ORAL | Status: DC | PRN

## 2020-10-05 MED ORDER — SODIUM CHLORIDE 0.9 % IR SOLN
Status: DC | PRN
  Administered 2020-10-05: 3000 mL

## 2020-10-05 MED ORDER — ACETAMINOPHEN 500 MG PO TABS
1000.0000 mg | ORAL_TABLET | ORAL | Status: AC
Start: 2020-10-05 — End: 2020-10-05
  Administered 2020-10-05: 1000 mg via ORAL

## 2020-10-05 MED ORDER — SOD CITRATE-CITRIC ACID 500-334 MG/5ML PO SOLN: 30.0000 mL | ORAL | Status: DC

## 2020-10-05 MED ORDER — BUPIVACAINE HCL 0.5 % IJ SOLN
INTRAMUSCULAR | Status: DC | PRN
  Administered 2020-10-05: 10 mL

## 2020-10-05 MED ORDER — LACTATED RINGERS IV SOLN: INTRAVENOUS | Status: DC

## 2020-10-05 MED ORDER — POVIDONE-IODINE 10 % EX SWAB: 2.0000 "application " | Freq: Once | CUTANEOUS | Status: DC

## 2020-10-05 MED ORDER — DEXAMETHASONE SODIUM PHOSPHATE 10 MG/ML IJ SOLN
INTRAMUSCULAR | Status: AC
  Filled 2020-10-05: qty 1

## 2020-10-05 MED ORDER — ONDANSETRON HCL 4 MG/2ML IJ SOLN: 4.0000 mg | Freq: Once | INTRAMUSCULAR | Status: DC | PRN

## 2020-10-05 MED ORDER — FENTANYL CITRATE (PF) 100 MCG/2ML IJ SOLN
INTRAMUSCULAR | Status: AC
  Filled 2020-10-05: qty 2

## 2020-10-05 MED ORDER — ONDANSETRON HCL 4 MG/2ML IJ SOLN
INTRAMUSCULAR | Status: AC
  Filled 2020-10-05: qty 2

## 2020-10-05 MED ORDER — DOXYCYCLINE HYCLATE 100 MG IV SOLR
200.0000 mg | INTRAVENOUS | Status: AC
  Administered 2020-10-05: 200 mg via INTRAVENOUS
  Filled 2020-10-05: qty 200

## 2020-10-05 MED ORDER — DEXAMETHASONE SODIUM PHOSPHATE 10 MG/ML IJ SOLN
INTRAMUSCULAR | Status: DC | PRN
  Administered 2020-10-05: 10 mg via INTRAVENOUS

## 2020-10-05 MED ORDER — IBUPROFEN 800 MG PO TABS: 800.0000 mg | ORAL_TABLET | Freq: Three times a day (TID) | ORAL | 0 refills | Status: DC | PRN

## 2020-10-05 MED ORDER — LIDOCAINE 2% (20 MG/ML) 5 ML SYRINGE
INTRAMUSCULAR | Status: AC
  Filled 2020-10-05: qty 5

## 2020-10-05 MED ORDER — FERRIC SUBSULFATE SOLN
Status: DC | PRN
  Administered 2020-10-05: 1

## 2020-10-05 MED ORDER — FENTANYL CITRATE (PF) 100 MCG/2ML IJ SOLN: 25.0000 ug | INTRAMUSCULAR | Status: DC | PRN

## 2020-10-05 MED ORDER — KETOROLAC TROMETHAMINE 30 MG/ML IJ SOLN: 30.0000 mg | Freq: Once | INTRAMUSCULAR | Status: DC | PRN

## 2020-10-05 MED ORDER — ACETAMINOPHEN 500 MG PO TABS
ORAL_TABLET | ORAL | Status: AC
  Filled 2020-10-05: qty 2

## 2020-10-05 MED ORDER — MIDAZOLAM HCL 2 MG/2ML IJ SOLN
INTRAMUSCULAR | Status: AC
  Filled 2020-10-05: qty 2

## 2020-10-05 SURGICAL SUPPLY — 17 items
CATH ROBINSON RED A/P 16FR (CATHETERS) ×2 IMPLANT
DRSG TELFA 3X8 NADH (GAUZE/BANDAGES/DRESSINGS) ×4 IMPLANT
ELECT REM PT RETURN 9FT ADLT (ELECTROSURGICAL)
ELECTRODE REM PT RTRN 9FT ADLT (ELECTROSURGICAL) IMPLANT
GAUZE 4X4 16PLY ~~LOC~~+RFID DBL (SPONGE) ×2 IMPLANT
GLOVE SURG ENC MOIS LTX SZ7.5 (GLOVE) ×2 IMPLANT
GLOVE SURG UNDER POLY LF SZ7 (GLOVE) ×2 IMPLANT
GOWN STRL REUS W/TWL LRG LVL3 (GOWN DISPOSABLE) ×2 IMPLANT
GOWN STRL REUS W/TWL XL LVL3 (GOWN DISPOSABLE) ×2 IMPLANT
KIT PROCEDURE FLUENT (KITS) ×2 IMPLANT
KIT TURNOVER CYSTO (KITS) ×2 IMPLANT
PACK VAGINAL MINOR WOMEN LF (CUSTOM PROCEDURE TRAY) ×2 IMPLANT
PAD OB MATERNITY 4.3X12.25 (PERSONAL CARE ITEMS) ×2 IMPLANT
PAD PREP 24X48 CUFFED NSTRL (MISCELLANEOUS) ×2 IMPLANT
SEAL ROD LENS SCOPE MYOSURE (ABLATOR) ×2 IMPLANT
SLEEVE SCD COMPRESS KNEE MED (STOCKING) ×2 IMPLANT
TOWEL OR 17X26 10 PK STRL BLUE (TOWEL DISPOSABLE) ×4 IMPLANT

## 2020-10-05 NOTE — Anesthesia Postprocedure Evaluation (Signed)
Anesthesia Post Note  Patient: Brianna Hawkins  Procedure(s) Performed: DILATATION AND CURETTAGE /HYSTEROSCOPY (Uterus)     Patient location during evaluation: PACU Anesthesia Type: General Level of consciousness: awake and alert Pain management: pain level controlled Vital Signs Assessment: post-procedure vital signs reviewed and stable Respiratory status: spontaneous breathing, nonlabored ventilation, respiratory function stable and patient connected to nasal cannula oxygen Cardiovascular status: blood pressure returned to baseline and stable Postop Assessment: no apparent nausea or vomiting Anesthetic complications: no   No notable events documented.  Last Vitals:  Vitals:   10/05/20 0945 10/05/20 1000  BP: 128/81 126/82  Pulse:  78  Resp: 18 20  Temp:  36.7 C  SpO2:  97%    Last Pain:  Vitals:   10/05/20 1000  TempSrc:   PainSc: 0-No pain                 Torrian Canion S

## 2020-10-05 NOTE — Discharge Instructions (Addendum)
No acetaminophen/Tylenol until after 2:00pm today if needed.   No ibuprofen, Advil, Aleve, Motrin, ketorolac, meloxicam, or naproxen until after 3:00pm today if needed.     DISCHARGE INSTRUCTIONS: HYSTEROSCOPY  The following instructions have been prepared to help you care for yourself upon your return home.   May take stool softner while taking narcotic pain medication to prevent constipation.  Drink plenty of water.  Personal hygiene:  Use sanitary pads for vaginal drainage, not tampons.  Shower the day after your procedure.  NO tub baths, pools or Jacuzzis for 2-3 weeks.  Wipe front to back after using the bathroom.  Activity and limitations:  Do NOT drive or operate any equipment for 24 hours. The effects of anesthesia are still present and drowsiness may result.  Do NOT rest in bed all day.  Walking is encouraged.  Walk up and down stairs slowly.  You may resume your normal activity in one to two days or as indicated by your physician. Sexual activity: NO intercourse for at least 2 weeks after the procedure, or as indicated by your Doctor.  Diet: Eat a light meal as desired this evening. You may resume your usual diet tomorrow.  Return to Work: You may resume your work activities in one to two days or as indicated by Marine scientist.  What to expect after your surgery: Expect to have vaginal bleeding/discharge for 2-3 days and spotting for up to 10 days. It is not unusual to have soreness for up to 1-2 weeks. You may have a slight burning sensation when you urinate for the first day. Mild cramps may continue for a couple of days. You may have a regular period in 2-6 weeks.  Call your doctor for any of the following:  Excessive vaginal bleeding or clotting, saturating and changing one pad every hour.  Inability to urinate 6 hours after discharge from hospital.  Pain not relieved by pain medication.  Fever of 100.4 F or greater.  Unusual vaginal discharge or  odor.     Post Anesthesia Home Care Instructions  Activity: Get plenty of rest for the remainder of the day. A responsible individual must stay with you for 24 hours following the procedure.  For the next 24 hours, DO NOT: -Drive a car -Paediatric nurse -Drink alcoholic beverages -Take any medication unless instructed by your physician -Make any legal decisions or sign important papers.  Meals: Start with liquid foods such as gelatin or soup. Progress to regular foods as tolerated. Avoid greasy, spicy, heavy foods. If nausea and/or vomiting occur, drink only clear liquids until the nausea and/or vomiting subsides. Call your physician if vomiting continues.  Special Instructions/Symptoms: Your throat may feel dry or sore from the anesthesia or the breathing tube placed in your throat during surgery. If this causes discomfort, gargle with warm salt water. The discomfort should disappear within 24 hours.

## 2020-10-05 NOTE — Anesthesia Procedure Notes (Signed)
Procedure Name: LMA Insertion Date/Time: 10/05/2020 8:24 AM Performed by: [place] Erica Richwine C, CRNA Pre-anesthesia Checklist: Patient identified, Emergency Drugs available, Suction available and Patient being monitored Patient Re-evaluated:Patient Re-evaluated prior to induction Oxygen Delivery Method: Circle system utilized Preoxygenation: Pre-oxygenation with 100% oxygen Induction Type: IV induction Ventilation: Mask ventilation without difficulty LMA: LMA inserted LMA Size: 4.0 Number of attempts: 1 Airway Equipment and Method: Bite block Placement Confirmation: positive ETCO2 Tube secured with: Tape Dental Injury: Teeth and Oropharynx as per pre-operative assessment

## 2020-10-05 NOTE — Transfer of Care (Signed)
Immediate Anesthesia Transfer of Care Note  Patient: Brianna Hawkins  Procedure(s) Performed: DILATATION AND CURETTAGE /HYSTEROSCOPY (Uterus)  Patient Location: PACU  Anesthesia Type:General  Level of Consciousness: awake, alert  and oriented  Airway & Oxygen Therapy: Patient Spontanous Breathing and Patient connected to face mask oxygen  Post-op Assessment: Report given to RN and Post -op Vital signs reviewed and stable  Post vital signs: Reviewed and stable  Last Vitals:  Vitals Value Taken Time  BP 125/84 10/05/20 0930  Temp 36.7 C 10/05/20 0925  Pulse 80 10/05/20 0932  Resp 21 10/05/20 0932  SpO2 99 % 10/05/20 0932  Vitals shown include unvalidated device data.  Last Pain:  Vitals:   10/05/20 0754  TempSrc: Oral  PainSc: 0-No pain      Patients Stated Pain Goal: 5 (90/37/95 [id number])  Complications: No notable events documented.

## 2020-10-05 NOTE — Interval H&P Note (Signed)
History and Physical Interval Note:  10/05/2020 8:15 AM  Brianna Hawkins  has presented today for surgery, with the diagnosis of PMB, endometril polyp.  The various methods of treatment have been discussed with the patient and family. After consideration of risks, benefits and other options for treatment, the patient has consented to  Procedure(s): DILATATION AND CURETTAGE /HYSTEROSCOPY (N/A) as a surgical intervention.  The patient's history has been reviewed, patient examined, no change in status, stable for surgery.  I have reviewed the patient's chart and labs.  Questions were answered to the patient's satisfaction.     [name]

## 2020-10-05 NOTE — Op Note (Signed)
PREOPERATIVE DIAGNOSIS:  Post menopausal bleeding POSTOPERATIVE DIAGNOSIS: The same PROCEDURE: Hysteroscopy, Dilation and Curettage. SURGEON:  Dr. Arlina Robes   INDICATIONS: 60 y.o. [id number]  here for scheduled surgery for the aforementioned diagnoses.   Risks of surgery were discussed with the patient including but not limited to: bleeding which may require transfusion; infection which may require antibiotics; injury to uterus or surrounding organs; intrauterine scarring which may impair future fertility; need for additional procedures including laparotomy or laparoscopy; and other postoperative/anesthesia complications. Written informed consent was obtained.    FINDINGS:  A 9 week size uterus.  Endometrial polyp. Normal ostia bilaterally.  ANESTHESIA:   General, paracervical block with 10 ml of 0.25% Marcaine INTRAVENOUS FLUIDS:  As recorded FLUID DEFICITS:  208 cc NS ESTIMATED BLOOD LOSS:  Less than 25 ml SPECIMENS: Endometrial curettings and polyp sent to pathology COMPLICATIONS:  None immediate.  PROCEDURE DETAILS:  The patient received intravenous antibiotics while in the preoperative area.  She was then taken to the operating room where general anesthesia was administered and was found to be adequate.  After an adequate timeout was performed, she was placed in the dorsal lithotomy position and examined; then prepped and draped in the sterile manner.   Her bladder was catheterized for an unmeasured amount of clear, yellow urine. A speculum was then placed in the patient's vagina and a single tooth tenaculum was applied to the anterior lip of the cervix.   A paracervical block using 10 ml of 0.25% Marcaine was administered.  The cervix was sounded to 9 cm and dilated manually with metal dilators to accommodate the 5 mm diagnostic hysteroscope.  Once the cervix was dilated, the hysteroscope was inserted under direct visualization using NS as a suspension medium.  The uterine cavity was  carefully examined with the findings as noted above.   After further careful visualization of the uterine cavity, the hysteroscope was removed under direct visualization.  A sharp curettage was then performed to obtain a moderate amount of endometrial curetting's and the endometrial polyp.  The tenaculum was removed from the anterior lip of the cervix and the vaginal speculum was removed after noting good hemostasis.  The patient tolerated the procedure well and was taken to the recovery area awake, extubated and in stable condition.  The patient will be discharged to home as per PACU criteria.  Routine postoperative instructions given.  She was prescribed Percocet & Ibuprofen. She will follow up in the clinic on 3-4 weeks  for postoperative evaluation.

## 2020-10-05 NOTE — Anesthesia Preprocedure Evaluation (Signed)
Anesthesia Evaluation  Patient identified by MRN, date of birth, ID band Patient awake    Reviewed: Allergy & Precautions, NPO status , Patient's Chart, lab work & pertinent test results  Airway Mallampati: II  TM Distance: >3 FB Neck ROM: Full    Dental no notable dental hx.    Pulmonary sleep apnea , former smoker,    Pulmonary exam normal breath sounds clear to auscultation       Cardiovascular hypertension, Normal cardiovascular exam Rhythm:Regular Rate:Normal     Neuro/Psych negative neurological ROS  negative psych ROS   GI/Hepatic negative GI ROS, Neg liver ROS,   Endo/Other  diabetesMorbid obesity  Renal/GU negative Renal ROS  negative genitourinary   Musculoskeletal negative musculoskeletal ROS (+)   Abdominal   Peds negative pediatric ROS (+)  Hematology negative hematology ROS (+)   Anesthesia Other Findings   Reproductive/Obstetrics negative OB ROS                             Anesthesia Physical Anesthesia Plan  ASA: 3  Anesthesia Plan: General   Post-op Pain Management:    Induction: Intravenous  PONV Risk Score and Plan: 3 and Ondansetron, Dexamethasone and Treatment may vary due to age or medical condition  Airway Management Planned: LMA  Additional Equipment:   Intra-op Plan:   Post-operative Plan: Extubation in OR  Informed Consent: I have reviewed the patients History and Physical, chart, labs and discussed the procedure including the risks, benefits and alternatives for the proposed anesthesia with the patient or authorized representative who has indicated his/her understanding and acceptance.     Dental advisory given  Plan Discussed with: CRNA and Surgeon  Anesthesia Plan Comments:         Anesthesia Quick Evaluation

## 2020-10-06 ENCOUNTER — Encounter (HOSPITAL_BASED_OUTPATIENT_CLINIC_OR_DEPARTMENT_OTHER): Payer: Self-pay | Admitting: Obstetrics and Gynecology

## 2020-10-06 LAB — SURGICAL PATHOLOGY

## 2020-10-25 ENCOUNTER — Other Ambulatory Visit: Payer: Self-pay

## 2020-10-25 ENCOUNTER — Other Ambulatory Visit: Payer: Self-pay | Admitting: Student

## 2020-10-25 ENCOUNTER — Encounter: Payer: Self-pay | Admitting: Student

## 2020-10-25 DIAGNOSIS — I10 Essential (primary) hypertension: Secondary | ICD-10-CM

## 2020-10-25 DIAGNOSIS — R9389 Abnormal findings on diagnostic imaging of other specified body structures: Secondary | ICD-10-CM

## 2020-10-25 MED ORDER — MEGESTROL ACETATE 40 MG PO TABS: 40.0000 mg | ORAL_TABLET | Freq: Two times a day (BID) | ORAL | 0 refills | Status: DC

## 2020-10-26 ENCOUNTER — Encounter: Payer: Self-pay | Admitting: Student

## 2020-10-26 NOTE — Telephone Encounter (Signed)
Patient has misplaced her prescription which is why she requested a refill.   Advised patient we should be able to send in additional tabs, however her insurance will more than likely not pay.   Patient reports she roughly needs ~40 pills.

## 2020-10-28 MED ORDER — AMLODIPINE BESYLATE 10 MG PO TABS: 10.0000 mg | ORAL_TABLET | Freq: Every day | ORAL | 0 refills | Status: DC

## 2020-10-28 NOTE — Addendum Note (Signed)
Addended by: Benita Gutter on: 10/28/2020 05:17 PM   Modules accepted: Orders, Level of Service

## 2020-10-28 NOTE — Telephone Encounter (Addendum)
That is fine. I will go ahead and send in new prescription. Please inform patient I have sent in the order.  Dr. Adah Salvage

## 2020-11-04 ENCOUNTER — Encounter: Payer: Self-pay | Admitting: Obstetrics and Gynecology

## 2020-11-04 ENCOUNTER — Ambulatory Visit (INDEPENDENT_AMBULATORY_CARE_PROVIDER_SITE_OTHER): Payer: Medicare Other | Admitting: Obstetrics and Gynecology

## 2020-11-04 ENCOUNTER — Other Ambulatory Visit: Payer: Self-pay

## 2020-11-04 VITALS — BP 114/76 | HR 90 | Ht 66.0 in | Wt 232.0 lb

## 2020-11-04 DIAGNOSIS — N840 Polyp of corpus uteri: Secondary | ICD-10-CM | POA: Diagnosis not present

## 2020-11-04 DIAGNOSIS — Z9889 Other specified postprocedural states: Secondary | ICD-10-CM | POA: Diagnosis not present

## 2020-11-04 DIAGNOSIS — N950 Postmenopausal bleeding: Secondary | ICD-10-CM

## 2020-11-04 NOTE — Progress Notes (Signed)
Pt presents today for post op following D&C/Hyst on 10/05/20. Pt states she has not had any bleeding since procedure as long as she is taking the Megace. She is taking the Megace BID.

## 2020-11-04 NOTE — Progress Notes (Signed)
Brianna Hawkins is here for post op appt. S/P hysteroscopy D & C for endometrial polyp and PMB. Pathology reviewed No bleeding since procedure. Still taking Megace. Denies any bowel or bladder dysfunction  PE AF VSS Lungs clear Heart RRR Abd soft + BS GU deferred  A/P Post op        PMB        Endometrial polyp  Doing well. Complete current Rx of Megace and then d/c. F/U in 3 months

## 2020-11-30 ENCOUNTER — Other Ambulatory Visit: Payer: Self-pay | Admitting: Student

## 2020-12-06 ENCOUNTER — Other Ambulatory Visit: Payer: Self-pay | Admitting: Family Medicine

## 2021-01-19 ENCOUNTER — Ambulatory Visit: Payer: Medicare Other | Admitting: Podiatry

## 2021-01-25 ENCOUNTER — Ambulatory Visit (INDEPENDENT_AMBULATORY_CARE_PROVIDER_SITE_OTHER): Payer: Medicare Other | Admitting: Podiatry

## 2021-01-25 ENCOUNTER — Other Ambulatory Visit: Payer: Self-pay

## 2021-01-25 DIAGNOSIS — B351 Tinea unguium: Secondary | ICD-10-CM

## 2021-01-25 DIAGNOSIS — L602 Onychogryphosis: Secondary | ICD-10-CM | POA: Diagnosis not present

## 2021-01-25 DIAGNOSIS — L603 Nail dystrophy: Secondary | ICD-10-CM | POA: Diagnosis not present

## 2021-01-25 DIAGNOSIS — L814 Other melanin hyperpigmentation: Secondary | ICD-10-CM | POA: Diagnosis not present

## 2021-01-25 NOTE — Progress Notes (Signed)
° °  Subjective: 61 y.o. female presenting today as a new patient for evaluation of onychomycosis to the bilateral great toenails.  Patient states this is going on for about 8 years now.  She has tried different modalities including topical and oral antifungal medications with no improvement.  She presents for further treatment and evaluation  Past Medical History:  Diagnosis Date   Coronary artery calcification seen on CT scan    cardiologist-- dr Cathie Olden; Coronary CTA 9/19: Ca score = 1 (80th percentile); calcified plaque distal LM; no obstructive CAD.   DM2 (diabetes mellitus, type 2) ([place])    followed by pcp-- (09-29-2020 pt stated check blood sugar twice wkly in am,  fasting sugar--- unsure per pt)   DOE (dyspnea on exertion)    09-29-2020  per pt sob w/ any activity , especially stairs, stated uses rescue inhaler and recovers quickly   GERD (gastroesophageal reflux disease)    H/O: substance abuse ([place])    per pt last used crack cocaine in 1998   Heart murmur    last echo in epic 03-26-2019, ef 50-55%, trivial TR   History of syncope 2014   vasovagal   History of treatment for tuberculosis 1998   per pt treated in 1998;  pt had bronchoscopy w/ bx's 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  Dermatology: Hyperkeratotic, discolored, thickened, onychodystrophy noted bilateral great toenails. Skin is warm, dry and supple bilateral lower extremities. Negative for open lesions or macerations.  Vascular: Palpable pedal pulses bilaterally. No edema or erythema noted. Capillary refill within normal limits.  Neurological: Epicritic and protective threshold grossly intact bilaterally.   Musculoskeletal Exam: Range of motion within normal  limits to all pedal and ankle joints bilateral. Muscle strength 5/5 in all groups bilateral.   Assessment: #1 Onychomycosis of toenails bilateral great toes  Plan of Care:  #1 Patient was evaluated. #2  Patient has tried multiple different treatment options in the past with no improvement including oral antifungal medication.  Before pursuing additional treatment I would like to confirm the diagnosis of onychomycosis of the toenails #3 note biopsy was performed to the left hallux nail plate using a nail nipper without incident or bleeding.  This was sent off for fungal culture #4 return to clinic in 4 weeks to review the nail biopsy results and discuss different treatment options   *Works at [employer] but recovering from the surgery  Edrick Kins, DPM [facility]  Dr. Edrick Kins, DPM    [address],  [address]                Office [phone]  Fax [phone]

## 2021-01-31 ENCOUNTER — Other Ambulatory Visit: Payer: Self-pay | Admitting: Student

## 2021-02-01 ENCOUNTER — Other Ambulatory Visit: Payer: Self-pay | Admitting: Family Medicine

## 2021-02-01 DIAGNOSIS — E785 Hyperlipidemia, unspecified: Secondary | ICD-10-CM

## 2021-02-27 ENCOUNTER — Other Ambulatory Visit: Payer: Self-pay

## 2021-02-27 ENCOUNTER — Ambulatory Visit (INDEPENDENT_AMBULATORY_CARE_PROVIDER_SITE_OTHER): Payer: Medicare Other | Admitting: Podiatry

## 2021-02-27 DIAGNOSIS — Z79899 Other long term (current) drug therapy: Secondary | ICD-10-CM | POA: Diagnosis not present

## 2021-02-27 DIAGNOSIS — B351 Tinea unguium: Secondary | ICD-10-CM | POA: Diagnosis not present

## 2021-02-27 NOTE — Progress Notes (Signed)
° °  Subjective: 61 y.o. female presenting today for follow-up evaluation of onychomycosis to the bilateral great toenails that have been going on for about 8 years now.  Last visit nail biopsy was obtained and sent to [facility] pathology services.  Patient presents for further treatment and evaluation and to review the results  Past Medical History:  Diagnosis Date   Coronary artery calcification seen on CT scan    cardiologist-- dr Cathie Olden; Coronary CTA 9/19: Ca score = 1 (80th percentile); calcified plaque distal LM; no obstructive CAD.   DM2 (diabetes mellitus, type 2) ([place])    followed by pcp-- (09-29-2020 pt stated check blood sugar twice wkly in am,  fasting sugar--- unsure per pt)   DOE (dyspnea on exertion)    09-29-2020  per pt sob w/ any activity , especially stairs, stated uses rescue inhaler and recovers quickly   GERD (gastroesophageal reflux disease)    H/O: substance abuse ([place])    per pt last used crack cocaine in 1998   Heart murmur    last echo in epic 03-26-2019, ef 50-55%, trivial TR   History of syncope 2014   vasovagal   History of treatment for tuberculosis 1998   per pt treated in 1998;  pt had bronchoscopy w/ bx's 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  Dermatology: Hyperkeratotic, discolored, thickened, onychodystrophy noted bilateral great toenails. Skin is warm, dry and supple bilateral lower extremities. Negative for open lesions or macerations.  Vascular: Palpable pedal pulses bilaterally. No edema or erythema noted. Capillary refill within normal limits.  Neurological: Epicritic and protective threshold grossly intact bilaterally.   Musculoskeletal Exam: No pedal deformity  Assessment: #1 Onychomycosis  of toenails bilateral great toes  Plan of Care:  #1 Patient was evaluated. #2  Unfortunately the nail biopsy results are still pending.  We contacted [facility] and they should be faxing the results over shortly #3 we did discuss different treatment options if the results are positive for nail fungus.  After discussing both oral, topical, and laser antifungal treatment modalities, we decided to go ahead and proceed with another round of oral antifungal medication. #4 if nail biopsy results are positive for fungus we will prescribe Lamisil 250 mg #90 daily #5 order placed for hepatic function panel.  Discontinue and follow-up with PCP if results are abnormal #6 return to clinic as needed  *Works at [employer], DPM [facility]  Dr. Edrick Kins, DPM    [address], [place] [address]                Office [phone]  Fax [phone]

## 2021-03-27 ENCOUNTER — Ambulatory Visit: Payer: Medicaid Other | Admitting: Podiatry

## 2021-03-30 ENCOUNTER — Telehealth: Payer: Self-pay | Admitting: *Deleted

## 2021-03-30 NOTE — Telephone Encounter (Addendum)
Patient is calling for the results of her nail culture from 01/25/21, results are in doctor's folder and ready to be scanned into epic.  ?

## 2021-04-03 NOTE — Telephone Encounter (Signed)
Hello Estill Bamberg,  ?I do not think we ever received culture results for this patient's nail biopsy.  Could you please follow-up with this?  Thanks, Dr. Amalia Hailey

## 2021-04-04 ENCOUNTER — Other Ambulatory Visit (HOSPITAL_COMMUNITY): Payer: Self-pay

## 2021-04-04 ENCOUNTER — Other Ambulatory Visit: Payer: Self-pay | Admitting: Student

## 2021-04-04 MED ORDER — METFORMIN HCL ER 500 MG PO TB24
500.0000 mg | ORAL_TABLET | Freq: Two times a day (BID) | ORAL | 0 refills | Status: DC
  Filled 2021-04-04: qty 60, 30d supply, fill #0

## 2021-04-11 ENCOUNTER — Encounter: Payer: Self-pay | Admitting: Student

## 2021-04-11 ENCOUNTER — Ambulatory Visit (INDEPENDENT_AMBULATORY_CARE_PROVIDER_SITE_OTHER): Payer: Medicare Other | Admitting: Student

## 2021-04-11 ENCOUNTER — Encounter: Payer: Self-pay | Admitting: Obstetrics

## 2021-04-11 ENCOUNTER — Other Ambulatory Visit (HOSPITAL_COMMUNITY): Payer: Self-pay

## 2021-04-11 VITALS — Ht 66.0 in | Wt 234.4 lb

## 2021-04-11 DIAGNOSIS — E119 Type 2 diabetes mellitus without complications: Secondary | ICD-10-CM

## 2021-04-11 DIAGNOSIS — M546 Pain in thoracic spine: Secondary | ICD-10-CM | POA: Diagnosis not present

## 2021-04-11 DIAGNOSIS — I10 Essential (primary) hypertension: Secondary | ICD-10-CM | POA: Diagnosis not present

## 2021-04-11 LAB — POCT GLYCOSYLATED HEMOGLOBIN (HGB A1C): HbA1c, POC (controlled diabetic range): 6.1 % (ref 0.0–7.0)

## 2021-04-11 MED ORDER — AMLODIPINE BESYLATE 10 MG PO TABS
10.0000 mg | ORAL_TABLET | Freq: Every day | ORAL | 0 refills | Status: DC
  Filled 2021-04-11: qty 90, 90d supply, fill #0

## 2021-04-11 MED ORDER — METFORMIN HCL ER 500 MG PO TB24
500.0000 mg | ORAL_TABLET | Freq: Two times a day (BID) | ORAL | 0 refills | Status: DC
  Filled 2021-04-11 – 2021-05-04 (×2): qty 60, 30d supply, fill #0

## 2021-04-11 MED ORDER — ESOMEPRAZOLE MAGNESIUM 40 MG PO CPDR
40.0000 mg | DELAYED_RELEASE_CAPSULE | Freq: Every day | ORAL | 1 refills | Status: DC
  Filled 2021-04-11: qty 30, 30d supply, fill #0
  Filled 2021-05-04: qty 30, 30d supply, fill #1

## 2021-04-11 NOTE — Progress Notes (Addendum)
? ? ?  SUBJECTIVE:  ? ?CHIEF COMPLAINT / HPI: Back pain ? ?61 year old female who presents with back pain that's worse at night while laying in bed. Pain started a month ago and patient describes it as vague and  intermittent pain that's felt more with positional changes. No recent trauma or changes in activity. She works at [employer] which requires long standing and also carrying of heavy boxes sometimes.  ? ?Bilateral paresthesia of the hands ?Patient reports intermittent paresthesia on both hands usually during sleep or laying down. And denies any extremity weakness or neck pain/tenderness.  ? ?Menopausal bleeding ?Patient reports she is being followed by Dr. Linna Darner who manages her postmenopausal bleeding.  She was found to have polyps and which was surgically removed by Dr. Rip Harbour.  Still have intermittent bleeding and have a follow-up appointment with Dr. Linna Darner next week. ?PERTINENT  PMH / PSH: HLD, T2DM ? ?OBJECTIVE:  ? ?Ht '5\' 6"'$  (1.676 m)   Wt 234 lb 6.4 oz (106.3 kg)   LMP 12/11/2013   BMI 37.83 kg/m?   ? ? ?Physical Exam ?General: Alert, well appearing, NAD,  ?Cardiovascular: RRR, No Murmurs, Normal S2/S2 ?Respiratory: CTAB, No wheezing or Rales ?Abdomen: No distension or tenderness ?Extremities: Muscle strength 5/5 in all extremities, sensation intact.  Positive Jobe's test (empty beer can test) on the right arm ?Musculoskeletal: No CVA tenderness, no back tenderness. ? ? ?ASSESSMENT/PLAN:  ? ?Right-sided thoracic back pain ?Patient reports intermittent right-sided thoracic back pain.  She has no tenderness on exam and with pain worse with positional change makes her presentation most likely musculoskeletal vs spinal disease process. Patient's job involves lifting of heavy boxes and longstanding work days which is consistent with possible back sprain.  Advised patient to use lidocaine or Voltaren gel interchangeable and Tylenol for more sever pain. Recommended proper positioning with bending and lifting  at work. Discussed benefit of physical therapy which will be considered in follow up visit in a month should pain continue or get worse. ? ?DM2 (diabetes mellitus, type 2) ([place]) ?A1c today was 6.0. Patient reports compliance and tolerance with her medication. ?-Refilled her metformin.  ? ?Cervical Radiculopathy. ?Patient reports bilateral intermittent paresthesia of the hands.  Chart review from MRI in 2017 shows multiple level cervical spondylosis. Patients symptoms are mostly likely due to her cervical disease process. Discussed benefit of physical therapy and option of Pregabalin.  Via shared decision making with patient we will follow up in a month and if symptoms persist will determine need for PT or medication. Patient is agreeable to plan. ? ?Follow-up in a month  ?  ? ? ?Alen Bleacher, MD ?[facility]  ? ? ?

## 2021-04-11 NOTE — Assessment & Plan Note (Addendum)
A1c today was 6.0. Patient reports compliance and tolerance with her medication. ?-Refilled her metformin.  ?

## 2021-04-11 NOTE — Assessment & Plan Note (Signed)
Patient reports intermittent right-sided thoracic back pain.  She has no tenderness on exam and with pain worse with positional change makes her presentation most likely musculoskeletal vs spinal disease process. Patient's job involves lifting of heavy boxes and longstanding work days which is consistent with possible back sprain.  Advised patient to use lidocaine or Voltaren gel interchangeable and Tylenol for more sever pain. Recommended proper positioning with bending and lifting at work. Discussed benefit of physical therapy which will be considered in follow up visit in a month should pain continue or get worse. ?

## 2021-04-11 NOTE — Patient Instructions (Signed)
It was wonderful to meet you today. Thank you for allowing me to be a part of your care. Below is a short summary of what we discussed at your visit today: ? ?Your back pain is likely due to muscle sprain.  I recommend using lidocaine patch or Voltaren gel. ? ?In reviewing your chart I found from previous x-ray you have some cervical arthritis which is most likely the cause of the numbness and tingling sensation in your hands. ? ?Please Follow-up in a month ? ?If you have any questions or concerns, please do not hesitate to contact us via phone or MyChart message.  ? ?Alen Bleacher, MD ?[facility]  ?

## 2021-04-12 ENCOUNTER — Other Ambulatory Visit (HOSPITAL_COMMUNITY): Payer: Self-pay

## 2021-04-12 ENCOUNTER — Other Ambulatory Visit: Payer: Self-pay | Admitting: *Deleted

## 2021-04-12 DIAGNOSIS — R9389 Abnormal findings on diagnostic imaging of other specified body structures: Secondary | ICD-10-CM

## 2021-04-12 MED ORDER — MEGESTROL ACETATE 40 MG PO TABS
40.0000 mg | ORAL_TABLET | Freq: Two times a day (BID) | ORAL | 0 refills | Status: DC
  Filled 2021-04-12 – 2021-04-20 (×2): qty 60, 30d supply, fill #0

## 2021-04-12 NOTE — Progress Notes (Signed)
Megace sent per Dr Rip Harbour approval ?See pt email encounter ?

## 2021-04-20 ENCOUNTER — Other Ambulatory Visit (HOSPITAL_COMMUNITY): Payer: Self-pay

## 2021-04-20 ENCOUNTER — Other Ambulatory Visit: Payer: Self-pay

## 2021-04-20 DIAGNOSIS — Z79899 Other long term (current) drug therapy: Secondary | ICD-10-CM

## 2021-04-20 MED ORDER — TERBINAFINE HCL 250 MG PO TABS
250.0000 mg | ORAL_TABLET | Freq: Every day | ORAL | 0 refills | Status: DC
  Filled 2021-04-20 – 2021-05-04 (×2): qty 90, 90d supply, fill #0

## 2021-04-25 ENCOUNTER — Ambulatory Visit: Payer: Medicare Other | Admitting: Physician Assistant

## 2021-04-28 ENCOUNTER — Other Ambulatory Visit (HOSPITAL_COMMUNITY): Payer: Self-pay

## 2021-05-04 ENCOUNTER — Other Ambulatory Visit (HOSPITAL_COMMUNITY): Payer: Self-pay

## 2021-05-04 ENCOUNTER — Other Ambulatory Visit: Payer: Self-pay | Admitting: Family Medicine

## 2021-05-04 DIAGNOSIS — E785 Hyperlipidemia, unspecified: Secondary | ICD-10-CM

## 2021-05-05 ENCOUNTER — Other Ambulatory Visit (HOSPITAL_COMMUNITY): Payer: Self-pay

## 2021-05-05 MED ORDER — HYDROCHLOROTHIAZIDE 25 MG PO TABS
25.0000 mg | ORAL_TABLET | Freq: Every day | ORAL | 0 refills | Status: DC
  Filled 2021-05-05: qty 90, 90d supply, fill #0

## 2021-05-05 MED ORDER — ATORVASTATIN CALCIUM 80 MG PO TABS
80.0000 mg | ORAL_TABLET | Freq: Every day | ORAL | 0 refills | Status: DC
  Filled 2021-05-05: qty 90, 90d supply, fill #0

## 2021-05-09 ENCOUNTER — Encounter: Payer: Self-pay | Admitting: Physician Assistant

## 2021-05-09 ENCOUNTER — Ambulatory Visit (INDEPENDENT_AMBULATORY_CARE_PROVIDER_SITE_OTHER): Payer: Medicare Other | Admitting: Physician Assistant

## 2021-05-09 VITALS — BP 120/64 | HR 72 | Ht 66.0 in | Wt 231.6 lb

## 2021-05-09 DIAGNOSIS — E7800 Pure hypercholesterolemia, unspecified: Secondary | ICD-10-CM

## 2021-05-09 DIAGNOSIS — E119 Type 2 diabetes mellitus without complications: Secondary | ICD-10-CM | POA: Diagnosis not present

## 2021-05-09 DIAGNOSIS — I10 Essential (primary) hypertension: Secondary | ICD-10-CM | POA: Diagnosis not present

## 2021-05-09 DIAGNOSIS — I2510 Atherosclerotic heart disease of native coronary artery without angina pectoris: Secondary | ICD-10-CM | POA: Diagnosis not present

## 2021-05-09 DIAGNOSIS — H81399 Other peripheral vertigo, unspecified ear: Secondary | ICD-10-CM | POA: Insufficient documentation

## 2021-05-09 NOTE — Assessment & Plan Note (Signed)
She notes many years of vertiginous symptoms.  She has a spinning sensation about twice a week.  We discussed the role for vestibular rehab.  She would like to pursue this. Refer to vestibular rehab.  ?

## 2021-05-09 NOTE — Patient Instructions (Signed)
Medication Instructions:  ?Your physician recommends that you continue on your current medications as directed. Please refer to the Current Medication list given to you today. ? ?*If you need a refill on your cardiac medications before your next appointment, please call your pharmacy* ? ? ?Lab Work: ?TODAY:  CMET & LIPID ? ?If you have labs (blood work) drawn today and your tests are completely normal, you will receive your results only by: ?MyChart Message (if you have MyChart) OR ?A paper copy in the mail ?If you have any lab test that is abnormal or we need to change your treatment, we will call you to review the results. ? ? ?Testing/Procedures: ?Your physician recommends you have a CT Calcium Score.  This is a self-pay study. ? ? ?You have been referred to Vestibular Rehab, someone will contact you to arrange this. ? ?Follow-Up: ?At [facility], you and your health needs are our priority.  As part of our continuing mission to provide you with exceptional heart care, we have created designated Provider Care Teams.  These Care Teams include your primary Cardiologist (physician) and Advanced Practice Providers (APPs -  Physician Assistants and Nurse Practitioners) who all work together to provide you with the care you need, when you need it. ? ?We recommend signing up for the patient portal called "MyChart".  Sign up information is provided on this After Visit Summary.  MyChart is used to connect with patients for Virtual Visits (Telemedicine).  Patients are able to view lab/test results, encounter notes, upcoming appointments, etc.  Non-urgent messages can be sent to your provider as well.   ?To learn more about what you can do with MyChart, go to [email or web address].   ? ?Your next appointment:   ?12 month(s) ? ?The format for your next appointment:   ?In Person ? ?Provider:   ?Mertie Moores, MD  or Richardson Dopp, PA-C       ? ? ?Other Instructions ? ? ?Important Information About Sugar ? ? ? ? ?  ?

## 2021-05-09 NOTE — Assessment & Plan Note (Signed)
BP controlled. Continue Norvasc 10 mg once daily, HCTA 25 mg once daily ?

## 2021-05-09 NOTE — Progress Notes (Signed)
?Cardiology Office Note:   ? ?Date:  05/09/2021  ? ?ID:  Brianna Hawkins, DOB March 22, 1960, MRN [id number] ? ?PCP:  Alen Bleacher, MD  ?[facility] HeartCare Providers ?Cardiologist:  Mertie Moores, MD ?Cardiology APP:  Liliane Shi, PA-C     ?Referring MD: Alen Bleacher, MD  ? ?Chief Complaint:  F/u for cor Ca2+ ?  ? ?Patient Profile: ?Coronary calcification ?CTA 9/19: Ca score 1; no obstructive CAD  ?Hypertension ?Diabetes mellitus ?Hyperlipidemia ?GERD ?Remote smoking history ?Possible family history of CAD ?OSA  ?Venous insufficiency ?Eval by Dr. Donnetta Hutching in 09/2018 >> no indication for vein ablation ? ?Prior CV Studies:  ?Echocardiogram 03/26/19 ?EF 50-55, no RWMA, normal RVSF, mild AoV sclerosis (no AS) ?  ?Coronary CTA 09/20/17 ?IMPRESSION: ?1. Coronary artery calcium score 1 Agatston unit, placing the ?patient in the 80th percentile for age and gender. Given relatively ?young age, this suggests high risk for future cardiac events.  ?2.  No obstructive coronary disease. ?  ?Echo 09/10/17 ?EF 55-60, no RWMA, normal diastolic function, trace MR/TR ?  ?Event monitor 03/2012 ?Normal sinus rhythm ?  ?Echocardiogram 01/2010 ?Reportedly normal-EF 63%, mild LAE ? ?History of Present Illness:   ?Brianna Hawkins is a 61 y.o. female with the above problem list.  She was last seen in September 2021.  She returns for follow-up.  She is here alone.  She works part time at [employer].  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  She has not had chest pain.  She has not had significant shortness of breath.  She has lost a lot of weight.  Her weight is down 20 lbs since July 2022 based upon her chart.  She has not had palpitations, syncope, orthopnea.  She notes episodes of dizziness with a spinning sensation.   ?   ?Past Medical History:  ?Diagnosis Date  ? Coronary artery calcification seen on CT scan   ? cardiologist-- dr Cathie Olden; Coronary CTA 9/19: Ca score = 1 (80th percentile); calcified plaque distal LM; no obstructive CAD.  ? DM2 (diabetes  mellitus, type 2) ([place])   ? followed by pcp-- (09-29-2020 pt stated check blood sugar twice wkly in am,  fasting sugar--- unsure per pt)  ? DOE (dyspnea on exertion)   ? 09-29-2020  per pt sob w/ any activity , especially stairs, stated uses rescue inhaler and recovers quickly  ? GERD (gastroesophageal reflux disease)   ? H/O: substance abuse ([place])   ? per pt last used crack cocaine in 1998  ? Heart murmur   ? last echo in epic 03-26-2019, ef 50-55%, trivial TR  ? History of syncope 2014  ? vasovagal  ? History of treatment for tuberculosis 1998  ? 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 HLD (hyperlipidemia)   ? Hypertension   ? Migraine   ? OA (osteoarthritis)   ? PMB (postmenopausal bleeding)   ? Urinary urgency   ? Vertigo   ? ?Current Medications: ?Current Meds  ?Medication Sig  ? AGAMATRIX ULTRA-THIN LANCETS MISC Check sugars twice daily before meals  ? albuterol (VENTOLIN HFA) 108 (90 Base) MCG/ACT inhaler Inhale 2 puffs into the lungs every 6 (six) hours as needed for wheezing or shortness of breath.  ? amLODipine (NORVASC) 10 MG tablet Take 1 tablet (10 mg total) by mouth daily.  ? aspirin EC 81 MG tablet Take 1 tablet (81 mg total) by mouth daily.  ?  atorvastatin (LIPITOR) 80 MG tablet Take 1 tablet by mouth once daily  ? Blood Glucose Monitoring Suppl (AGAMATRIX PRESTO) w/Device KIT Check sugars twice daily before meals  ? Cholecalciferol (VITAMIN D3) 50 MCG (2000 UT) TABS Take 5,000 Units by mouth daily.   ? esomeprazole (NEXIUM) 40 MG capsule Take 1 capsule (40 mg total) by mouth daily.  ? fluticasone (FLONASE) 50 MCG/ACT nasal spray Place 2 sprays into both nostrils daily.  ? glucose blood (AGAMATRIX PRESTO TEST) test strip Check blood glucose twice daily before meals  ? hydrochlorothiazide (HYDRODIURIL) 25 MG tablet Take 1 tablet by mouth once daily  ? ibuprofen (ADVIL) 800 MG tablet Take 1 tablet (800 mg total) by mouth every 8  (eight) hours as needed.  ? megestrol (MEGACE) 40 MG tablet Take 1 tablet (40 mg total) by mouth 2 (two) times daily.  ? metFORMIN (GLUCOPHAGE-XR) 500 MG 24 hr tablet Take 1 tablet (500 mg total) by mouth 2 (two) times daily with a meal.  ? Multiple Vitamins-Minerals (HAIR/SKIN/NAILS) TABS Take 1 tablet by mouth daily.  ? naproxen (NAPROSYN) 500 MG tablet Take 500 mg by mouth 2 (two) times daily as needed.  ? nitroGLYCERIN (NITROSTAT) 0.4 MG SL tablet 1 tab under tongue for chest pressure, may repeat every 5 minutes if not relieved for total of 3 doses.  ? rizatriptan (MAXALT) 10 MG tablet Take 10 mg by mouth as needed for migraine. May repeat in 2 hours if needed  ? terbinafine (LAMISIL) 250 MG tablet Take 1 tablet (250 mg total) by mouth daily.  ?  ?Allergies:   Patient has no known allergies.  ? ?Social History  ? ?Tobacco Use  ? Smoking status: Former  ?  Years: 15.00  ?  Types: Cigarettes  ?  Quit date: 2000  ?  Years since quitting: 23.3  ? Smokeless tobacco: Never  ?Vaping Use  ? Vaping Use: Never used  ?Substance Use Topics  ? Alcohol use: Not Currently  ?  Comment: occasional  ? Drug use: Not Currently  ?  Comment: per pt Last crack cocaine 1998  ?  ?Family Hx: ?The patient's family history includes Alcohol abuse in her father and mother; Breast cancer in her paternal grandmother; Cancer in her mother; Cancer (age of onset: 64) in her father; Colon cancer in her father; Dementia in her paternal grandmother; Diabetes in her maternal grandmother, mother, and paternal grandmother; HIV in her sister; Heart disease in her mother; Heart disease (age of onset: 54) in her sister; Hypertension in her paternal grandmother; Skin cancer in her paternal grandmother; Stroke in her sister; Throat cancer in her father. There is no history of Sudden Cardiac Death. ? ?Review of Systems  ?Cardiovascular:  Negative for claudication.  ?Musculoskeletal:  Positive for joint pain.   ? ?EKGs/Labs/Other Test Reviewed:   ? ?EKG:  EKG  is not ordered today.  The ekg ordered today demonstrates n/a ? ?EKG done 10/03/2020 personally viewed and interpreted-NSR, HR 80, normal axis, nonspecific ST-T wave changes, QTc 435 ? ?Recent Labs: ?10/03/2020: BUN 15; Creatinine, Ser 0.71; Hemoglobin 11.5; Platelets 304; Potassium 3.3; Sodium 143  ? ?Recent Lipid Panel ?No results for input(s): CHOL, TRIG, HDL, VLDL, LDLCALC, LDLDIRECT in the last 8760 hours.  ? ?Risk Assessment/Calculations:   ?  ?    ?Physical Exam:   ? ?VS:  BP 120/64   Pulse 72   Ht 5' 6"  (1.676 m)   Wt 231 lb 9.6 oz (105.1 kg)   LMP  12/11/2013   SpO2 97%   BMI 37.38 kg/m?    ? ?Wt Readings from Last 3 Encounters:  ?05/09/21 231 lb 9.6 oz (105.1 kg)  ?04/11/21 234 lb 6.4 oz (106.3 kg)  ?11/04/20 232 lb (105.2 kg)  ?  ?Constitutional:   ?   Appearance: Healthy appearance. Not in distress.  ?Neck:  ?   Vascular: No carotid bruit or JVR. JVD normal.  ?Pulmonary:  ?   Effort: Pulmonary effort is normal.  ?   Breath sounds: No wheezing. No rales.  ?Cardiovascular:  ?   Normal rate. Regular rhythm. Normal S1. Normal S2.   ?   Murmurs: There is no murmur.  ?Edema: ?   Peripheral edema absent.  ?Abdominal:  ?   Palpations: Abdomen is soft.  ?Skin: ?   General: Skin is warm and dry.  ?Neurological:  ?   Mental Status: Alert and oriented to person, place and time.  ?   Cranial Nerves: Cranial nerves are intact.  ?  ?    ?ASSESSMENT & PLAN:   ?Coronary artery calcification seen on CT scan ?She is not having symptoms to suggest angina.  She had a CAC score of 1 in 2019.  There was no CAD on CTA.  She is concerned about her FHx.  She would like to repeat her CAC score.   ?Continue ASA 81 mg once daily, Lipitor 80 mg once daily ?Arrange CAC score ?F/u 1 year.  ? ?Essential hypertension ?BP controlled. Continue Norvasc 10 mg once daily, HCTA 25 mg once daily ? ?Hyperlipidemia ?Continue Lipitor 80 mg once daily.  CMET, Fasting Lipids. ? ?DM2 (diabetes mellitus, type 2) ([place]) ?Recent A1c 6.1! ? ?Vertigo,  peripheral ?She notes many years of vertiginous symptoms.  She has a spinning sensation about twice a week.  We discussed the role for vestibular rehab.  She would like to pursue this. Refer to vestibular re

## 2021-05-09 NOTE — Assessment & Plan Note (Signed)
Recent A1c 6.1! ?

## 2021-05-09 NOTE — Assessment & Plan Note (Signed)
She is not having symptoms to suggest angina.  She had a CAC score of 1 in 2019.  There was no CAD on CTA.  She is concerned about her FHx.  She would like to repeat her CAC score.   ?? Continue ASA 81 mg once daily, Lipitor 80 mg once daily ?? Arrange CAC score ?? F/u 1 year.  ?

## 2021-05-09 NOTE — Assessment & Plan Note (Signed)
Continue Lipitor 80 mg once daily.  CMET, Fasting Lipids. ?

## 2021-05-10 LAB — COMPREHENSIVE METABOLIC PANEL
ALT: 13 IU/L (ref 0–32)
AST: 18 IU/L (ref 0–40)
Albumin/Globulin Ratio: 1.7 (ref 1.2–2.2)
Albumin: 4.7 g/dL (ref 3.8–4.9)
Alkaline Phosphatase: 133 IU/L — ABNORMAL HIGH (ref 44–121)
BUN/Creatinine Ratio: 14 (ref 9–23)
BUN: 10 mg/dL (ref 6–24)
Bilirubin Total: 0.6 mg/dL (ref 0.0–1.2)
CO2: 24 mmol/L (ref 20–29)
Calcium: 9.8 mg/dL (ref 8.7–10.2)
Chloride: 105 mmol/L (ref 96–106)
Creatinine, Ser: 0.7 mg/dL (ref 0.57–1.00)
Globulin, Total: 2.8 g/dL (ref 1.5–4.5)
Glucose: 92 mg/dL (ref 70–99)
Potassium: 3.8 mmol/L (ref 3.5–5.2)
Sodium: 144 mmol/L (ref 134–144)
Total Protein: 7.5 g/dL (ref 6.0–8.5)
eGFR: 100 mL/min/{1.73_m2} (ref >59)

## 2021-05-10 LAB — LIPID PANEL
Chol/HDL Ratio: 3.4 ratio (ref 0.0–4.4)
Cholesterol, Total: 158 mg/dL (ref 100–199)
HDL: 47 mg/dL (ref >39)
LDL Chol Calc (NIH): 97 mg/dL (ref 0–99)
Triglycerides: 73 mg/dL (ref 0–149)
VLDL Cholesterol Cal: 14 mg/dL (ref 5–40)

## 2021-05-10 NOTE — Progress Notes (Signed)
Pt has been made aware of normal lab results.  She was advised to follow-up with her PCP re: Alkaline Phosphatase levels.  She verbalized understanding.  jw

## 2021-05-10 NOTE — Addendum Note (Signed)
Addended by: Gaetano Net on: 05/10/2021 07:48 AM ? ? Modules accepted: Orders ? ?

## 2021-05-11 ENCOUNTER — Ambulatory Visit (INDEPENDENT_AMBULATORY_CARE_PROVIDER_SITE_OTHER): Payer: Medicare Other | Admitting: Student

## 2021-05-11 ENCOUNTER — Encounter: Payer: Self-pay | Admitting: Student

## 2021-05-11 ENCOUNTER — Other Ambulatory Visit (HOSPITAL_COMMUNITY): Payer: Self-pay

## 2021-05-11 VITALS — BP 115/86 | HR 72 | Ht 66.0 in | Wt 230.4 lb

## 2021-05-11 DIAGNOSIS — I2510 Atherosclerotic heart disease of native coronary artery without angina pectoris: Secondary | ICD-10-CM | POA: Diagnosis not present

## 2021-05-11 DIAGNOSIS — I10 Essential (primary) hypertension: Secondary | ICD-10-CM | POA: Diagnosis not present

## 2021-05-11 DIAGNOSIS — M546 Pain in thoracic spine: Secondary | ICD-10-CM | POA: Diagnosis not present

## 2021-05-11 MED ORDER — AMLODIPINE BESYLATE 10 MG PO TABS
10.0000 mg | ORAL_TABLET | Freq: Every day | ORAL | 0 refills | Status: DC
  Filled 2021-05-11: qty 90, 90d supply, fill #0

## 2021-05-11 MED ORDER — BACLOFEN 10 MG PO TABS
10.0000 mg | ORAL_TABLET | Freq: Two times a day (BID) | ORAL | 0 refills | Status: DC
  Filled 2021-05-11: qty 30, 15d supply, fill #0

## 2021-05-11 NOTE — Progress Notes (Addendum)
? ? ?  SUBJECTIVE:  ? ?CHIEF COMPLAINT / HPI: Follow-up for back pain. ? ?61 year old female presents today for follow-up concerning back pain. Patient report her back pain has gotten a bit worse but she has not tried Voltaren gel or lidocaine patch as discussed from prior visits.  She endorses back stretch in the morning and heating pads which she started yesterday provided some relieve.  Quality of back pain is about the same, located in the right thoracic region, worse with movement and nonradiating. Denies any incontinence or LE paresthesia. ? ?Elevated ALP ?Patient reports at her last visit with her cardiologist was informed of elevated ALP however ALT and AST were normal.  ALP elevated to 131. Denies having right abdominal tenderness or recent bowel changes. ? ?PERTINENT  PMH / PSH: HTN, OSA, T2DM. ? ?OBJECTIVE:  ? ?BP 115/86   Pulse 72   Ht '5\' 6"'$  (1.676 m)   Wt 230 lb 6.4 oz (104.5 kg)   LMP 12/11/2013   SpO2 100%   BMI 37.19 kg/m?   ? ? ?Physical Exam ?General: Alert, well appearing, NAD ?Cardiovascular: RRR, No Murmurs, Normal S2/S2 ?Respiratory: Good work of breathing on room air ?Abdomin: Non-tender, non-distended ?Extremities: No edema on extremities   ? ? ?ASSESSMENT/PLAN:  ? ?Right-sided thoracic back pain ?Patient reports continued back pain and noncompliance to treatment. No reg flags and back pain is consistent with musckuloskeletal etiology.  Back stretch and heating pad improved symptoms.   ?-Recommend alternating between Voltaren gel, lidocaine patch and heat pad.  ?- Avoid lifting heavy items. ?-Rx baclofen 10 mg twice daily. ?-Follow-up in a month. ?  ?Elevated ALP ?Patient has an isolated borderline elevated ALP with normal AST and ALT.  Likely a transient increase. She denies any abdominal pain. Will recheck ALP in a month and if progressively higher will consider abdominal US and further assessment.  ? ?Alen Bleacher, MD ?[place]  ? ? ?

## 2021-05-11 NOTE — Assessment & Plan Note (Addendum)
Patient reports continued back pain and noncompliance to treatment. No reg flags and back pain is consistent with musckuloskeletal etiology.  Back stretch and heating pad improved symptoms.   ?-Recommend alternating between Voltaren gel, lidocaine patch and heat pad.  ?- Avoid lifting heavy items. ?-Rx baclofen 10 mg twice daily. ?-Follow-up in a month. ?

## 2021-05-11 NOTE — Patient Instructions (Signed)
It was wonderful to see you today. Thank you for allowing me to be a part of your care. Below is a short summary of what we discussed at your visit today: ? ?I recommend alternating between heat pad, apply Voltaren gel and lidocaine patch  ? ?That would baclofen 10 mg twice daily. ? ?Follow-up in a months. ? ?If you have any questions or concerns, please do not hesitate to contact us via phone or MyChart message.  ? ?Alen Bleacher, MD ?[facility]  ?

## 2021-05-18 ENCOUNTER — Ambulatory Visit: Payer: Medicare Other | Attending: Physician Assistant | Admitting: Physical Therapy

## 2021-05-18 DIAGNOSIS — H81399 Other peripheral vertigo, unspecified ear: Secondary | ICD-10-CM | POA: Insufficient documentation

## 2021-05-18 DIAGNOSIS — H8113 Benign paroxysmal vertigo, bilateral: Secondary | ICD-10-CM | POA: Insufficient documentation

## 2021-05-18 DIAGNOSIS — R42 Dizziness and giddiness: Secondary | ICD-10-CM | POA: Insufficient documentation

## 2021-05-18 NOTE — Therapy (Signed)
OUTPATIENT PHYSICAL THERAPY VESTIBULAR EVALUATION   Rationale for Evaluation and Treatment Rehabilitation   Patient Name: Brianna Hawkins MRN: [id number] DOB:April 03, 1960, 61 y.o., female Today's Date: 05/19/2021  PCP: Alen Bleacher, MD REFERRING PROVIDER: Sharmon Revere    PT End of Session - 05/19/21 0904     Visit Number 1    Number of Visits 4    Date for PT Re-Evaluation 06/23/21    Authorization Type Medicare    Authorization Time Period 05-18-21 - 06-30-21    PT Start Time 0930    PT Stop Time 2094    PT Time Calculation (min) 45 min    Activity Tolerance Patient tolerated treatment well    Behavior During Therapy [facility] for tasks assessed/performed           Rationale for Evaluation and Treatment Rehabilitation   Past Medical History:  Diagnosis Date   Coronary artery calcification seen on CT scan    cardiologist-- dr Cathie Olden; Coronary CTA 9/19: Ca score = 1 (80th percentile); calcified plaque distal LM; no obstructive CAD.   DM2 (diabetes mellitus, type 2) ([place])    followed by pcp-- (09-29-2020 pt stated check blood sugar twice wkly in am,  fasting sugar--- unsure per pt)   DOE (dyspnea on exertion)    09-29-2020  per pt sob w/ any activity , especially stairs, stated uses rescue inhaler and recovers quickly   GERD (gastroesophageal reflux disease)    H/O: substance abuse ([place])    per pt last used crack cocaine in 1998   Heart murmur    last echo in epic 03-26-2019, ef 50-55%, trivial TR   History of syncope 2014   vasovagal   History of treatment for tuberculosis 1998   per pt treated in 1998;  pt had bronchoscopy w/ bx's RUL 10-11-2005 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   HLD (hyperlipidemia)    Hypertension    Migraine    OA (osteoarthritis)    PMB (postmenopausal bleeding)    Urinary urgency    Vertigo    Past Surgical History:  Procedure Laterality Date   COLONOSCOPY WITH ESOPHAGOGASTRODUODENOSCOPY (EGD)  09/05/2011   by dr Sharlett Iles    ETHMOIDECTOMY Left 07/10/2013   Procedure: ETHMOIDECTOMY LEFT,MAXILLARY OSTIAL ENLARGEMENT WITH REMOVAL OF DISEASE;  Surgeon: Rozetta Nunnery, MD;  Location: [facility];  Service: ENT;  Laterality: Left;   HYSTEROSCOPY WITH D & C N/A 10/05/2020   Procedure: DILATATION AND CURETTAGE /HYSTEROSCOPY;  Surgeon: [name], MD;  Location: [facility];  Service: Gynecology;  Laterality: N/A;   SOFT TISSUE MASS EXCISION Right 05/17/2008   '@[facility]'$ ;  right axilla  (benign)   TUBAL LIGATION Bilateral    1980s   TURBINATE REDUCTION Left 07/10/2013   Procedure: LEFT TURBINATE REDUCTION;  Surgeon: Rozetta Nunnery, MD;  Location: [facility];  Service: ENT;  Laterality: Left;   Patient Active Problem List   Diagnosis Date Noted   Vertigo, peripheral 05/09/2021   Right-sided thoracic back pain 04/11/2021   Post-operative state 11/04/2020   Endometrial polyp    Postmenopausal bleeding 07/01/2020   OSA (obstructive sleep apnea) 03/03/2020   Other fatigue 03/27/2019   Depression, major, single episode, moderate ([place]) 03/27/2019   Venous insufficiency of both lower extremities 08/11/2018   Hyperlipidemia 11/07/2017   Coronary artery calcification seen on CT scan    DM2 (diabetes mellitus, type 2) ([facility])    Chronic migraine without aura without status migrainosus, not  intractable 11/11/2014   Morbid obesity ([place]) 11/11/2014   Chronic sinusitis 07/06/2013   Abnormal CXR 04/21/2011   Essential hypertension 02/08/2011   Tuberculosis 01/02/1996    ONSET DATE: Initial diagnosis of vertigo in 2013: Referral date 05-10-21;  REFERRING DIAG: Peripheral Vertigo  THERAPY DIAG:  Dizziness and giddiness - Plan: PT plan of care cert/re-cert  BPPV (benign paroxysmal positional vertigo), bilateral - Plan: PT plan of care cert/re-cert  SUBJECTIVE:   SUBJECTIVE STATEMENT: Pt reports she had most recent episode of vertigo last week - lasts for few seconds  to minutes. Pt states she gets dizzy when she bends down to assist her husband with bathing and is also unable to do her own hair because unable to look up and tilt head back; pt states it also happens when driving her car; states she averages 1-2 episodes/week ; states the room spinning vertigo only happens when she is sitting on couch or in car  Pt accompanied by: self  PERTINENT HISTORY:   HTN, OSA, Type 2 DM.   PAIN:  Are you having pain? No  PRECAUTIONS: None  WEIGHT BEARING RESTRICTIONS No  FALLS: Has patient fallen in last 6 months? No  LIVING ENVIRONMENT: Lives with: lives with their spouse Lives in: House/apartment  PLOF: Independent; works at [employer] 3 hours/day 3 days/week  PATIENT GOALS Resolve the vertigo if possible  OBJECTIVE:   DIAGNOSTIC FINDINGS: N/A  COGNITION: Overall cognitive status: Within functional limits for tasks assessed   Cervical ROM:  WNL's    GAIT: Gait pattern: WFL Distance walked: 50 Assistive device utilized: None Level of assistance: Complete Independence  FUNCTIONAL TESTs:  MCTSIB: Condition 1: Avg of 3 trials: 30 sec, Condition 2: Avg of 3 trials: 30 sec, Condition 3: Avg of 3 trials: 30 sec, Condition 4: Avg of 3 trials: 30 sec, and Total Score: 120/120  PATIENT SURVEYS:  FOTO DPS 53; risk adjusted 51/100   VESTIBULAR ASSESSMENT   GENERAL OBSERVATION: Pt is a 61 yr old lady with c/o episodic vertigo, lasting only secs to minutes; pt states most recent episode was last week - has no c/o vertigo at this time; pt states she has had vertigo episodes since approx. 2012    SYMPTOM BEHAVIOR:   Subjective history: pt states she has had episodic vertigo since approx. 2010   Non-Vestibular symptoms: spontaneous in occurrence at times - not always provoked by change in position Type of dizziness: Blurred Vision, Diplopia, Spinning/Vertigo, and Unsteady with head/body turns   Frequency: 1-2 episodes/week   Duration: seconds to  minutes   Aggravating factors: Spontaneous and Induced by position change: neck extension   Relieving factors:  holding on to something   Progression of symptoms: unchanged   OCULOMOTOR EXAM:   Ocular Alignment: normal   Ocular ROM: No Limitations   Spontaneous Nystagmus: absent   Gaze-Induced Nystagmus: absent   Smooth Pursuits: intact   Saccades: intact        VESTIBULAR - OCULAR REFLEX:      Dynamic Visual Acuity: Static: line 10 Dynamic: line 8 (WNL's)    POSITIONAL TESTING: Right Dix-Hallpike: none; Duration:none Left Dix-Hallpike: none; Duration: none Right Sidelying: none; Duration: none Left Sidelying: none; Duration: none    MOTION SENSITIVITY:    Motion Sensitivity Quotient  Intensity: 0 = none, 1 = Lightheaded, 2 = Mild, 3 = Moderate, 4 = Severe, 5 = Vomiting  Intensity  1. Sitting to supine 0  2. Supine to L side 0  3. Supine to  R side 0  4. Supine to sitting 0  5. L Hallpike-Dix 0  6. Up from L  1  7. R Hallpike-Dix 0  8. Up from R  1  9. Sitting, head  tipped to L knee 0  10. Head up from L  knee 0  11. Sitting, head  tipped to R knee 0  12. Head up from R  knee 0  13. Sitting head turns x5 0  14.Sitting head nods x5 0  15. In stance, 180  turn to L  0  16. In stance, 180  turn to R 0       VESTIBULAR TREATMENT:  None as No vertigo was provoked at evaluation   PATIENT EDUCATION: Education details: pt was given article on BPPV etiology as some of her symptoms are consistent with BPPV Person educated: Patient Education method: Explanation and Handouts (article on BPPV from [place]) Education comprehension: verbalized understanding   GOALS: Goals reviewed with patient? Yes  LONG TERM GOALS: Target date: 06/16/2021  (Remove Blue Hyperlink)  Pt will verbalize understanding of Epley maneuver and habituation exercises as appropriate for self treatment of BPPV/vertigo. Baseline: dependent Goal status: INITIAL  2.  Increase FOTO score to >/  58/100 to demonstrate improvement in vertigo for improved quality of life. Baseline: DPS 53/100 Goal status: INITIAL  3.  Continue to assess vertigo and treat if provoked during session. Baseline: No vertigo provoked during initial eval on 05-19-21 Goal status: INITIAL   ASSESSMENT:  CLINICAL IMPRESSION: Patient is a 61 y.o. lady who was seen today for physical therapy evaluation and treatment for vertigo.   All positional testing was negative with no c/o vertigo and no nystagmus noted with any positional testing.  Pt's symptoms are mostly consistent with BPPV which has resolved at current time.  Pt's balance and gait are WNL's with no deficits noted.  Pt able to stand for 30 secs on foam with EC without unsteadiness or LOB, indicative of normal vestibular input in maintaining balance.  Pt will be reassessed in 2 weeks; she was instructed to call clinic earlier should she have an episode prior to scheduled PT appt.      OBJECTIVE IMPAIRMENTS dizziness.   ACTIVITY LIMITATIONS  none at this time due to no vertigo .   PERSONAL FACTORS Past/current experiences are also affecting patient's functional outcome.    REHAB POTENTIAL: Good  CLINICAL DECISION MAKING: Stable/uncomplicated  EVALUATION COMPLEXITY: Low   PLAN: PT FREQUENCY: 1x/week  PT DURATION: 4 weeks  PLANNED INTERVENTIONS: Therapeutic exercises, Therapeutic activity, Neuromuscular re-education, Balance training, Gait training, Patient/Family education, and [place] repositioning  PLAN FOR NEXT SESSION: recheck vertigo and treat prn - D/C if no c/o vertigo   Charlane Westry, [place], PT 05/19/2021, 9:41 AM

## 2021-05-19 ENCOUNTER — Encounter: Payer: Self-pay | Admitting: Physical Therapy

## 2021-06-06 ENCOUNTER — Ambulatory Visit: Payer: Medicare Other | Attending: Physician Assistant | Admitting: Physical Therapy

## 2021-06-06 ENCOUNTER — Encounter: Payer: Self-pay | Admitting: *Deleted

## 2021-06-06 DIAGNOSIS — H8113 Benign paroxysmal vertigo, bilateral: Secondary | ICD-10-CM | POA: Diagnosis not present

## 2021-06-06 DIAGNOSIS — R42 Dizziness and giddiness: Secondary | ICD-10-CM | POA: Insufficient documentation

## 2021-06-06 NOTE — Therapy (Unsigned)
OUTPATIENT PHYSICAL THERAPY VESTIBULAR TREATMENT NOTE & DISCHARGE SUMMARY   Rationale for Evaluation and Treatment Rehabilitation   Patient Name: Brianna Hawkins MRN: [id number] DOB:[birth year], 61 y.o., female Today's Date: 06/07/2021  PCP: Alen Bleacher, MD REFERRING PROVIDER: Sharmon Revere    PT End of Session - 06/07/21 1506     Visit Number 2    Number of Visits 4    Date for PT Re-Evaluation 06/23/21    Authorization Type Medicare    Authorization Time Period 05-18-21 - 06-30-21    PT Start Time 1017    PT Stop Time 1050   session ended early as pt is discharged   PT Time Calculation (min) 33 min    Activity Tolerance Patient tolerated treatment well    Behavior During Therapy [facility] for tasks assessed/performed            Rationale for Evaluation and Treatment Rehabilitation   Past Medical History:  Diagnosis Date   Coronary artery calcification seen on CT scan    cardiologist-- dr Cathie Olden; Coronary CTA 9/19: Ca score = 1 (80th percentile); calcified plaque distal LM; no obstructive CAD.   DM2 (diabetes mellitus, type 2) ([place])    followed by pcp-- (09-29-2020 pt stated check blood sugar twice wkly in am,  fasting sugar--- unsure per pt)   DOE (dyspnea on exertion)    09-29-2020  per pt sob w/ any activity , especially stairs, stated uses rescue inhaler and recovers quickly   GERD (gastroesophageal reflux disease)    H/O: substance abuse ([place])    per pt last used crack cocaine in 1998   Heart murmur    last echo in epic 03-26-2019, ef 50-55%, trivial TR   History of syncope 2014   vasovagal   History of treatment for tuberculosis 1998   per pt treated in 1998;  pt had bronchoscopy w/ bx's RUL 10-11-2005 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   HLD (hyperlipidemia)    Hypertension    Migraine    OA (osteoarthritis)    PMB (postmenopausal bleeding)    Urinary urgency    Vertigo    Past Surgical History:  Procedure Laterality Date   COLONOSCOPY WITH  ESOPHAGOGASTRODUODENOSCOPY (EGD)  09/05/2011   by dr Sharlett Iles   ETHMOIDECTOMY Left 07/10/2013   Procedure: ETHMOIDECTOMY LEFT,MAXILLARY OSTIAL ENLARGEMENT WITH REMOVAL OF DISEASE;  Surgeon: Rozetta Nunnery, MD;  Location: [place];  Service: ENT;  Laterality: Left;   HYSTEROSCOPY WITH D & C N/A 10/05/2020   Procedure: DILATATION AND CURETTAGE /HYSTEROSCOPY;  Surgeon: [name], MD;  Location: [place];  Service: Gynecology;  Laterality: N/A;   SOFT TISSUE MASS EXCISION Right 05/17/2008   @[facility] ;  right axilla  (benign)   TUBAL LIGATION Bilateral    1980s   TURBINATE REDUCTION Left 07/10/2013   Procedure: LEFT TURBINATE REDUCTION;  Surgeon: Rozetta Nunnery, MD;  Location: [place];  Service: ENT;  Laterality: Left;   Patient Active Problem List   Diagnosis Date Noted   Vertigo, peripheral 05/09/2021   Right-sided thoracic back pain 04/11/2021   Post-operative state 11/04/2020   Endometrial polyp    Postmenopausal bleeding 07/01/2020   OSA (obstructive sleep apnea) 03/03/2020   Other fatigue 03/27/2019   Depression, major, single episode, moderate ([place]) 03/27/2019   Venous insufficiency of both lower extremities 08/11/2018   Hyperlipidemia 11/07/2017   Coronary artery calcification seen on CT scan    DM2 (diabetes mellitus, type  2) ([place])    Chronic migraine without aura without status migrainosus, not intractable 11/11/2014   Morbid obesity ([place]) 11/11/2014   Chronic sinusitis 07/06/2013   Abnormal CXR 04/21/2011   Essential hypertension 02/08/2011   Tuberculosis 01/02/1996    ONSET DATE: Initial diagnosis of vertigo in 2013: Referral date 05-10-21;  REFERRING DIAG: Peripheral Vertigo  THERAPY DIAG:  BPPV (benign paroxysmal positional vertigo), bilateral  Dizziness and giddiness  SUBJECTIVE:   SUBJECTIVE STATEMENT: Pt reports she had some dizziness while she was sitting on couch watching TV but states it  only lasted for about 5 secs and then it passed; states she still gets dizziness at times when she looks in mirror to do her hair - but states it does not happen every time Pt reports she started to cancel today's appt because the dizziness is better overall, and is not really interfering with her function at home  Pt accompanied by: self  PERTINENT HISTORY:   HTN, OSA, Type 2 DM.   PAIN:  Are you having pain? No  PRECAUTIONS: None  WEIGHT BEARING RESTRICTIONS No  FALLS: Has patient fallen in last 6 months? No  LIVING ENVIRONMENT: Lives with: lives with their spouse Lives in: House/apartment  PLOF: Independent; works at [employer] 3 hours/day 3 days/week  PATIENT GOALS Resolve the vertigo if possible   TREATMENT:     POSITIONAL TESTING: Right Dix-Hallpike: none; Duration:none Left Dix-Hallpike: none; Duration: none Right Sidelying: none; Duration: none Left Sidelying: none; Duration: none  Oculomotor testing - smooth pursuits and saccades normal with no nystagmus and no c/o vertigo with testing   Reviewed Epley maneuver for self treatment prn should she have episode of BPPV     PATIENT EDUCATION: Education details: pt was given article on BPPV etiology as some of her symptoms are consistent with BPPV Person educated: Patient Education method: Explanation and Handouts (article on BPPV from [facility]) Education comprehension: verbalized understanding   GOALS: Goals reviewed with patient? Yes  LONG TERM GOALS: Target date: 06/16/2021  (Remove Blue Hyperlink)  Pt will verbalize understanding of Epley maneuver and habituation exercises as appropriate for self treatment of BPPV/vertigo. Baseline: dependent Goal status: MET 06-06-21  2.  Increase FOTO score to >/ 58/100 to demonstrate improvement in vertigo for improved quality of life. Baseline: DPS 53/100 Goal status: PARTIALLY MET as score = 55/100  3.  Continue to assess vertigo and treat if provoked during  session. Baseline: No vertigo provoked during initial eval on 05-19-21 Goal status: MET - no vertigo provoked during session on 06-06-21   ASSESSMENT:  CLINICAL IMPRESSION: Patient has met 3/3 LTG's; all positional testing remains negative with no nystagmus noted and no c/o vertigo reported or able to be provoked with any movements on 06-06-21.  Pt continues to report that she has spontaneous occurrences of dizziness, lasting a few seconds and states it sometimes occurs when she is seated on sofa watching TV.  Pt states the dizziness occurs infrequently and is not interfering with her daily function, as it is intermittent and short duration when it does occur.  Pt is discharged due to goals met and dizziness has been unable to be provoked during 2 sessions.  Pt agrees with D/C at this time.       OBJECTIVE IMPAIRMENTS dizziness.   ACTIVITY LIMITATIONS  none at this time due to no vertigo .   PERSONAL FACTORS Past/current experiences are also affecting patient's functional outcome.    REHAB POTENTIAL: Good  CLINICAL DECISION MAKING: Stable/uncomplicated  EVALUATION COMPLEXITY: Low   PLAN: PT FREQUENCY: 1x/week  PT DURATION: 4 weeks  PLANNED INTERVENTIONS: Therapeutic exercises, Therapeutic activity, Neuromuscular re-education, Balance training, Gait training, Patient/Family education, and Canalith repositioning  PLAN FOR NEXT SESSION:  D/C on 06-06-21 due to no vertigo provoked    PHYSICAL THERAPY DISCHARGE SUMMARY  Visits from Start of Care: 2  Current functional level related to goals / functional outcomes: Pt has met 3/3 LTG's; no vertigo has been provoked during either the initial eval 2 weeks ago or in today's session   Remaining deficits: Intermittent dizziness of short duration per pt report; is spontaneous in occurrence and lasts only few seconds   Education / Equipment: Pt has been instructed in Epley maneuver for self treatment prn - symptoms somewhat consistent with  BPPV but no vertigo has been provoked during eval or in follow up PT session   Patient agrees to discharge. Patient goals were met. Patient is being discharged due to meeting the stated rehab goals.    Alda Lea, PT 06/07/2021, 3:09 PM

## 2021-06-07 ENCOUNTER — Encounter: Payer: Self-pay | Admitting: Physical Therapy

## 2021-06-08 ENCOUNTER — Encounter: Payer: Self-pay | Admitting: Student

## 2021-06-08 ENCOUNTER — Ambulatory Visit (INDEPENDENT_AMBULATORY_CARE_PROVIDER_SITE_OTHER): Payer: Medicare Other | Admitting: Student

## 2021-06-08 VITALS — BP 115/79 | HR 80 | Ht 66.0 in | Wt 234.4 lb

## 2021-06-08 DIAGNOSIS — I10 Essential (primary) hypertension: Secondary | ICD-10-CM

## 2021-06-08 DIAGNOSIS — R7401 Elevation of levels of liver transaminase levels: Secondary | ICD-10-CM

## 2021-06-08 DIAGNOSIS — I2510 Atherosclerotic heart disease of native coronary artery without angina pectoris: Secondary | ICD-10-CM

## 2021-06-08 DIAGNOSIS — M546 Pain in thoracic spine: Secondary | ICD-10-CM

## 2021-06-08 MED ORDER — ESOMEPRAZOLE MAGNESIUM 40 MG PO CPDR: 40.0000 mg | DELAYED_RELEASE_CAPSULE | Freq: Every day | ORAL | 1 refills | Status: DC

## 2021-06-08 MED ORDER — AMLODIPINE BESYLATE 10 MG PO TABS: 10.0000 mg | ORAL_TABLET | Freq: Every day | ORAL | 0 refills | Status: DC

## 2021-06-08 NOTE — Progress Notes (Signed)
    SUBJECTIVE:   CHIEF COMPLAINT / HPI: Follow-up for back pain  Ms. Kirchman presented today for 1 month follow-up for back pain. She said her back pain has significantly improved since starting baclofen and using heat pad.  She stopped carrying heavy boxes at work and endorses compliance with medication and good tolerance as well.  PERTINENT  PMH / PSH: HTN, OSA, T2DM  OBJECTIVE:   BP 115/79   Pulse 80   Ht '5\' 6"'$  (1.676 m)   Wt 234 lb 6.4 oz (106.3 kg)   LMP 12/11/2013   SpO2 100%   BMI 37.83 kg/m    Physical Exam General: Alert, well appearing, NAD,  Cardiovascular: RRR, No Murmurs, Normal S2/S2 Respiratory: CTAB, No wheezing or Rales Abdomen: No distension or tenderness Extremities: No edema on extremities     ASSESSMENT/PLAN:   Right-sided thoracic back pain Patient reports improvement to back pain and tolerance to baclofen. Heating PAD provided relieve as well  -Continue baclofen -Continue heating pad -Advised patient to continue avoiding lifting heavy boxes. -Reviewed return precautions     Isolated Elevated ALP Patient denies RUQ pain. ALP a month ago was elevated to 133. -Obtained Liver function test   Alen Bleacher, MD [place]

## 2021-06-08 NOTE — Assessment & Plan Note (Signed)
Patient reports improvement to back pain and tolerance to baclofen. Heating PAD provided relieve as well  -Continue baclofen -Continue heating pad -Advised patient to continue avoiding lifting heavy boxes. -Reviewed return precautions

## 2021-06-08 NOTE — Progress Notes (Signed)
Pt referred to [facility] for vertigo.  Notes indicate she did not really have significant findings for vertigo on testing.  DC summary states her dizziness was better but still having some symptoms.   Recommend she f/u with primary care to evaluate for other causes of dizziness. Richardson Dopp, PA-C    06/08/2021 8:23 AM

## 2021-06-08 NOTE — Patient Instructions (Signed)
It was wonderful to see you today. Thank you for allowing me to be a part of your care. Below is a short summary of what we discussed at your visit today:  Continue current treatment for your back  We will obtain lab to check alkaline phosphate level    If you have any questions or concerns, please do not hesitate to contact us via phone or MyChart message.   Alen Bleacher, MD [facility]

## 2021-06-09 LAB — HEPATIC FUNCTION PANEL
ALT: 12 IU/L (ref 0–32)
AST: 18 IU/L (ref 0–40)
Albumin: 3.9 g/dL (ref 3.8–4.9)
Alkaline Phosphatase: 97 IU/L (ref 44–121)
Bilirubin Total: 0.4 mg/dL (ref 0.0–1.2)
Bilirubin, Direct: 0.12 mg/dL (ref 0.00–0.40)
Total Protein: 6 g/dL (ref 6.0–8.5)

## 2021-06-23 ENCOUNTER — Encounter: Payer: Self-pay | Admitting: Student

## 2021-06-23 ENCOUNTER — Encounter: Payer: Self-pay | Admitting: Family Medicine

## 2021-06-23 ENCOUNTER — Ambulatory Visit (INDEPENDENT_AMBULATORY_CARE_PROVIDER_SITE_OTHER): Payer: Medicare Other | Admitting: Student

## 2021-06-23 ENCOUNTER — Other Ambulatory Visit: Payer: Self-pay | Admitting: Family Medicine

## 2021-06-23 ENCOUNTER — Ambulatory Visit
Admission: RE | Admit: 2021-06-23 | Discharge: 2021-06-23 | Disposition: A | Discharge status: Home / Self Care | Payer: Medicare Other | Source: Ambulatory Visit | Attending: Family Medicine | Admitting: Family Medicine

## 2021-06-23 VITALS — BP 130/90 | HR 90 | Ht 66.0 in | Wt 227.6 lb

## 2021-06-23 DIAGNOSIS — I152 Hypertension secondary to endocrine disorders: Secondary | ICD-10-CM

## 2021-06-23 DIAGNOSIS — R051 Acute cough: Secondary | ICD-10-CM | POA: Diagnosis not present

## 2021-06-23 DIAGNOSIS — J029 Acute pharyngitis, unspecified: Secondary | ICD-10-CM | POA: Diagnosis not present

## 2021-06-23 DIAGNOSIS — R059 Cough, unspecified: Secondary | ICD-10-CM | POA: Diagnosis not present

## 2021-06-23 DIAGNOSIS — I2510 Atherosclerotic heart disease of native coronary artery without angina pectoris: Secondary | ICD-10-CM | POA: Diagnosis not present

## 2021-06-23 DIAGNOSIS — R0781 Pleurodynia: Secondary | ICD-10-CM | POA: Diagnosis not present

## 2021-06-23 DIAGNOSIS — E1159 Type 2 diabetes mellitus with other circulatory complications: Secondary | ICD-10-CM

## 2021-06-23 DIAGNOSIS — E119 Type 2 diabetes mellitus without complications: Secondary | ICD-10-CM | POA: Diagnosis not present

## 2021-06-23 LAB — POCT RAPID STREP A (OFFICE): Rapid Strep A Screen: NEGATIVE

## 2021-06-23 MED ORDER — SHINGRIX 50 MCG/0.5ML IM SUSR: 0.5000 mL | Freq: Once | INTRAMUSCULAR | 0 refills | Status: AC

## 2021-06-24 LAB — COVID-19, FLU A+B AND RSV
Influenza A, NAA: NOT DETECTED
Influenza B, NAA: NOT DETECTED
RSV, NAA: NOT DETECTED
SARS-CoV-2, NAA: DETECTED — AB

## 2021-06-26 ENCOUNTER — Telehealth: Payer: Self-pay

## 2021-06-27 ENCOUNTER — Other Ambulatory Visit: Payer: Self-pay | Admitting: Student

## 2021-06-27 DIAGNOSIS — U071 COVID-19: Secondary | ICD-10-CM

## 2021-06-27 MED ORDER — ONDANSETRON HCL 4 MG PO TABS: 4.0000 mg | ORAL_TABLET | Freq: Three times a day (TID) | ORAL | 0 refills | Status: AC | PRN

## 2021-06-29 ENCOUNTER — Other Ambulatory Visit: Payer: Medicare Other

## 2021-06-30 ENCOUNTER — Encounter: Payer: Self-pay | Admitting: Student

## 2021-07-03 ENCOUNTER — Other Ambulatory Visit: Payer: Self-pay | Admitting: Student

## 2021-07-03 ENCOUNTER — Encounter: Payer: Self-pay | Admitting: Student

## 2021-07-03 MED ORDER — METFORMIN HCL ER 500 MG PO TB24
500.0000 mg | ORAL_TABLET | Freq: Two times a day (BID) | ORAL | 0 refills | Status: DC
Start: 2021-07-03 — End: 2021-07-24

## 2021-07-04 DIAGNOSIS — Z23 Encounter for immunization: Secondary | ICD-10-CM | POA: Diagnosis not present

## 2021-07-23 ENCOUNTER — Other Ambulatory Visit: Payer: Self-pay | Admitting: Student

## 2021-07-24 ENCOUNTER — Other Ambulatory Visit: Payer: Self-pay | Admitting: Student

## 2021-08-22 ENCOUNTER — Other Ambulatory Visit: Payer: Self-pay | Admitting: Student

## 2021-08-22 DIAGNOSIS — I10 Essential (primary) hypertension: Secondary | ICD-10-CM

## 2021-08-22 MED ORDER — AMLODIPINE BESYLATE 10 MG PO TABS: 10.0000 mg | ORAL_TABLET | Freq: Every day | ORAL | 0 refills | Status: DC

## 2021-08-22 MED ORDER — HYDROCHLOROTHIAZIDE 25 MG PO TABS
25.0000 mg | ORAL_TABLET | Freq: Every day | ORAL | 0 refills | Status: DC
Start: 2021-08-22 — End: 2021-11-13

## 2021-08-24 ENCOUNTER — Other Ambulatory Visit: Payer: Self-pay | Admitting: Student

## 2021-08-31 ENCOUNTER — Encounter: Payer: Self-pay | Admitting: *Deleted

## 2021-09-09 ENCOUNTER — Other Ambulatory Visit: Payer: Self-pay | Admitting: Student

## 2021-09-24 DIAGNOSIS — Z23 Encounter for immunization: Secondary | ICD-10-CM | POA: Diagnosis not present

## 2021-09-28 ENCOUNTER — Other Ambulatory Visit: Payer: Self-pay | Admitting: Student

## 2021-10-03 ENCOUNTER — Encounter: Payer: Self-pay | Admitting: Student

## 2021-10-03 ENCOUNTER — Ambulatory Visit (INDEPENDENT_AMBULATORY_CARE_PROVIDER_SITE_OTHER): Payer: Medicare Other | Admitting: Student

## 2021-10-03 VITALS — BP 122/78 | HR 76 | Ht 66.0 in | Wt 233.0 lb

## 2021-10-03 DIAGNOSIS — E119 Type 2 diabetes mellitus without complications: Secondary | ICD-10-CM

## 2021-10-03 DIAGNOSIS — I2510 Atherosclerotic heart disease of native coronary artery without angina pectoris: Secondary | ICD-10-CM

## 2021-10-03 DIAGNOSIS — Z139 Encounter for screening, unspecified: Secondary | ICD-10-CM

## 2021-10-03 NOTE — Progress Notes (Signed)
    SUBJECTIVE:   CHIEF COMPLAINT / HPI:   Patient is a 61 year old female who presents today for follow up after recent labs at a plasma center showed AFP was elevated.  Patient reports that she was informed at the plasma center that normal AFP is less than 6.9 however her AFP was high at 7.2.  Patient denies any right abdominal pain, no unexpected weight loss, no diarrhea, no headache or shortness of breath. She denies any night sweat.  She has no family history of liver cancer, testicular or ovarian cancers. However she does report history of throat and rectal cancer in her father.  Her grandmother had history of breast cancer at age of 47.  PERTINENT  PMH / PSH: Noncontributory  OBJECTIVE:   Ht '5\' 6"'$  (1.676 m)   Wt 233 lb (105.7 kg)   LMP 12/11/2013   BMI 37.61 kg/m    Physical Exam General: Alert, well appearing, NAD Cardiovascular: RRR, No Murmurs, Normal S2/S2 Respiratory: CTAB, No wheezing or Rales Abdomen: No distension or tenderness Extremities: No edema on extremities   Skin: Warm and dry. No jaundice  ASSESSMENT/PLAN:   Concern for elevated AFP Patient presented with concern for possible elevated AFP because she was informed that AFP was elevated at 7.2.  Patient did not present today with lab results and this was not accessible on our system.  Informed patient that the normal AFP levels is less than 10 ng/mL which will put her within normal range. She has no red flag symptoms concerning for malignancy such as right sided abdominal pain, unexplained weight loss or night sweats.  Advised patient to follow-up in a week and to bring a copy of the AFP results.  Depending on the result, we will consider doing a CMP to check liver function test.  HCM Patient is due for colonoscopy.  Last colonoscopy was in 2013. -Placed GI referral for colonoscopy.  T2DM Patient with history of type 2 diabetes is due for foot exam, no follow-up optometry exam, A1c and diabetic kidney  evaluation.  We will follow-up with this test at her next visit in a week.  Alen Bleacher, MD [place]

## 2021-10-03 NOTE — Patient Instructions (Addendum)
It was wonderful to meet you today. Thank you for allowing me to be a part of your care. Below is a short summary of what we discussed at your visit today:  Our AFP reference range shows normal for less than 10ng/ml.   Please bring the results of your last AFP from the plasma center to confirm unit of measurement.  And depending on the results we will do liver function test.  Placed referral for your colonoscopy.  Follow-up in a week to check your diabetes and reassess your AFP  Please bring all of your medications to every appointment!  If you have any questions or concerns, please do not hesitate to contact us via phone or MyChart message.   Alen Bleacher, MD [facility]

## 2021-10-05 ENCOUNTER — Other Ambulatory Visit: Payer: Self-pay | Admitting: Student

## 2021-10-12 NOTE — Progress Notes (Signed)
    SUBJECTIVE:   CHIEF COMPLAINT / HPI:   Diabetes Patient's current diabetic medications include metformin '500mg'$  BID. Tolerating well without side effects.  Patient endorses compliance with these medications. A1c  6 months ago was 6.1.Denies abdominal pain, blurred vision, polyuria, polydipsia, hypoglycemia. Patient states they understand that diet and exercise can help with her diabetes.  Annual foot and eye exam is due   PERTINENT  PMH / PSH: Reviewed  OBJECTIVE:   BP 119/86   Pulse 88   Ht '5\' 6"'$  (1.676 m)   Wt 235 lb (106.6 kg)   LMP 12/11/2013   SpO2 99%   BMI 37.93 kg/m    Physical Exam General: Alert, well appearing, NAD Cardiovascular: Regular rate, Well perfused Respiratory: Normal work of breathing on RA Abdomen: No distension or tenderness Foot exam: No notable lesion, +2 pedal pulse. Sensation intact  ASSESSMENT/PLAN:   DM2 (diabetes mellitus, type 2) ([place]) Patient diabetes is well controlled with A1c of 5.9 today. Endorses compliance and tolerance to medication. Her annual foot exam completed today was normal. Advised patient to complete her annual eye exam. Obtained lab to check for  microalbuminuria.    HCM -Patient received her influenza vaccine. Reviewed risk and benefit   Alen Bleacher, MD [place]

## 2021-10-13 ENCOUNTER — Encounter: Payer: Self-pay | Admitting: Student

## 2021-10-13 ENCOUNTER — Ambulatory Visit (INDEPENDENT_AMBULATORY_CARE_PROVIDER_SITE_OTHER): Payer: Medicare Other | Admitting: Student

## 2021-10-13 VITALS — BP 119/86 | HR 88 | Ht 66.0 in | Wt 235.0 lb

## 2021-10-13 DIAGNOSIS — I2510 Atherosclerotic heart disease of native coronary artery without angina pectoris: Secondary | ICD-10-CM

## 2021-10-13 DIAGNOSIS — Z23 Encounter for immunization: Secondary | ICD-10-CM

## 2021-10-13 DIAGNOSIS — E119 Type 2 diabetes mellitus without complications: Secondary | ICD-10-CM | POA: Diagnosis not present

## 2021-10-13 LAB — POCT GLYCOSYLATED HEMOGLOBIN (HGB A1C): HbA1c, POC (controlled diabetic range): 5.9 % (ref 0.0–7.0)

## 2021-10-13 NOTE — Assessment & Plan Note (Signed)
Patient diabetes is well controlled with A1c of 5.9 today. Endorses compliance and tolerance to medication. Her annual foot exam completed today was normal. Advised patient to complete her annual eye exam. Obtained lab to check for  microalbuminuria.

## 2021-10-13 NOTE — Patient Instructions (Signed)
It was wonderful to meet you today. Thank you for allowing me to be a part of your care. Below is a short summary of what we discussed at your visit today:  We check your A1c today and your foot exam was normal  Obtained lab to check your kidney function   Please bring all of your medications to every appointment!  If you have any questions or concerns, please do not hesitate to contact us via phone or MyChart message.   Alen Bleacher, MD [facility]

## 2021-10-14 DIAGNOSIS — Z23 Encounter for immunization: Secondary | ICD-10-CM | POA: Diagnosis not present

## 2021-10-14 LAB — MICROALBUMIN / CREATININE URINE RATIO
Creatinine, Urine: 99.4 mg/dL
Microalb/Creat Ratio: 5 mg/g creat (ref 0–29)
Microalbumin, Urine: 4.8 ug/mL

## 2021-10-27 ENCOUNTER — Other Ambulatory Visit: Payer: Self-pay | Admitting: Student

## 2021-11-02 ENCOUNTER — Ambulatory Visit: Payer: Self-pay

## 2021-11-02 VITALS — Ht 66.0 in | Wt 233.0 lb

## 2021-11-02 DIAGNOSIS — Z1211 Encounter for screening for malignant neoplasm of colon: Secondary | ICD-10-CM

## 2021-11-02 MED ORDER — PEG 3350-KCL-NA BICARB-NACL 420 G PO SOLR: 4000.0000 mL | Freq: Once | ORAL | 0 refills | Status: AC

## 2021-11-02 NOTE — Progress Notes (Signed)

## 2021-11-12 ENCOUNTER — Other Ambulatory Visit: Payer: Self-pay | Admitting: Student

## 2021-11-16 ENCOUNTER — Other Ambulatory Visit: Payer: Self-pay | Admitting: Student

## 2021-11-21 ENCOUNTER — Encounter: Payer: Self-pay | Admitting: Internal Medicine

## 2021-11-21 ENCOUNTER — Ambulatory Visit (AMBULATORY_SURGERY_CENTER): Payer: Medicare Other | Admitting: Internal Medicine

## 2021-11-21 VITALS — BP 117/77 | HR 72 | Temp 97.3°F | Resp 16 | Ht 66.0 in | Wt 233.0 lb

## 2021-11-21 DIAGNOSIS — D122 Benign neoplasm of ascending colon: Secondary | ICD-10-CM | POA: Diagnosis not present

## 2021-11-21 DIAGNOSIS — Z1211 Encounter for screening for malignant neoplasm of colon: Secondary | ICD-10-CM

## 2021-11-21 DIAGNOSIS — D120 Benign neoplasm of cecum: Secondary | ICD-10-CM | POA: Diagnosis not present

## 2021-11-21 DIAGNOSIS — E785 Hyperlipidemia, unspecified: Secondary | ICD-10-CM | POA: Diagnosis not present

## 2021-11-21 DIAGNOSIS — D125 Benign neoplasm of sigmoid colon: Secondary | ICD-10-CM

## 2021-11-21 DIAGNOSIS — K635 Polyp of colon: Secondary | ICD-10-CM | POA: Diagnosis not present

## 2021-11-21 DIAGNOSIS — Z800 Family history of malignant neoplasm of digestive organs: Secondary | ICD-10-CM | POA: Diagnosis not present

## 2021-11-21 DIAGNOSIS — I10 Essential (primary) hypertension: Secondary | ICD-10-CM | POA: Diagnosis not present

## 2021-11-21 MED ORDER — SODIUM CHLORIDE 0.9 % IV SOLN: 500.0000 mL | Freq: Once | INTRAVENOUS | Status: DC

## 2021-11-21 NOTE — Progress Notes (Signed)
GASTROENTEROLOGY PROCEDURE H&P NOTE   Primary Care Physician: Alen Bleacher, MD    Reason for Procedure:   Colon cancer screening  Plan:    Colonoscopy  Patient is appropriate for endoscopic procedure(s) in the ambulatory ([place]) setting.  The nature of the procedure, as well as the risks, benefits, and alternatives were carefully and thoroughly reviewed with the patient. Ample time for discussion and questions allowed. The patient understood, was satisfied, and agreed to proceed.     HPI: Brianna Hawkins is a 61 y.o. female who presents for colonoscopy for colon cancer screening. Denies blood in stools, changes in bowel habits, or unintentional weight loss. Father had colon cancer in his 24s.   Past Medical History:  Diagnosis Date   Coronary artery calcification seen on CT scan    cardiologist-- dr Cathie Olden; Coronary CTA 9/19: Ca score = 1 (80th percentile); calcified plaque distal LM; no obstructive CAD.   DM2 (diabetes mellitus, type 2) ([place])    followed by pcp-- (09-29-2020 pt stated check blood sugar twice wkly in am,  fasting sugar--- unsure per pt)   DOE (dyspnea on exertion)    09-29-2020  per pt sob w/ any activity , especially stairs, stated uses rescue inhaler and recovers quickly   Endometrial polyp    Hysteroscopy D & C 10/22   GERD (gastroesophageal reflux disease)    H/O: substance abuse ([place])    per pt last used crack cocaine in 1998   Heart murmur    last echo in epic 03-26-2019, ef 50-55%, trivial TR   History of syncope 2014   vasovagal   History of treatment for tuberculosis 1998   per pt treated in 1998;  pt had bronchoscopy w/ bx's RUL 10-11-2005 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   HLD (hyperlipidemia)    Hypertension    Migraine    OA (osteoarthritis)    PMB (postmenopausal bleeding)    Postmenopausal bleeding 07/01/2020   Tuberculosis 01/02/1996   7 YEARS AGO   Urinary urgency    Vertigo     Past Surgical History:  Procedure  Laterality Date   COLONOSCOPY WITH ESOPHAGOGASTRODUODENOSCOPY (EGD)  09/05/2011   by dr Sharlett Iles   ETHMOIDECTOMY Left 07/10/2013   Procedure: ETHMOIDECTOMY LEFT,MAXILLARY OSTIAL ENLARGEMENT WITH REMOVAL OF DISEASE;  Surgeon: Rozetta Nunnery, MD;  Location: [place];  Service: ENT;  Laterality: Left;   HYSTEROSCOPY WITH D & C N/A 10/05/2020   Procedure: DILATATION AND CURETTAGE /HYSTEROSCOPY;  Surgeon: [name], MD;  Location: [facility];  Service: Gynecology;  Laterality: N/A;   MYOMECTOMY     NASAL POLYP SURGERY     SOFT TISSUE MASS EXCISION Right 05/17/2008   _0 ;  right axilla  (benign)   TUBAL LIGATION Bilateral    1980s   TURBINATE REDUCTION Left 07/10/2013   Procedure: LEFT TURBINATE REDUCTION;  Surgeon: Rozetta Nunnery, MD;  Location: [facility];  Service: ENT;  Laterality: Left;    Prior to Admission medications   Medication Sig Start Date End Date Taking? Authorizing Provider  Truddie Crumble ULTRA-THIN LANCETS MISC Check sugars twice daily before meals 08/07/17   Mack Hook, MD  amLODipine (NORVASC) 10 MG tablet Take 1 tablet (10 mg total) by mouth daily. 08/22/21   Alen Bleacher, MD  aspirin EC 81 MG tablet Take 1 tablet (81 mg total) by mouth daily. 05/15/16   Mack Hook, MD  atorvastatin (LIPITOR) 80 MG tablet Take 1 tablet by  mouth once daily 05/05/21   Alen Bleacher, MD  Blood Glucose Monitoring Suppl [facility] PRESTO) w/Device KIT Check sugars twice daily before meals 08/07/17   Mack Hook, MD  Cholecalciferol (VITAMIN D3) 50 MCG (2000 UT) TABS Take 5,000 Units by mouth daily.     [provider]  esomeprazole (NEXIUM) 40 MG capsule Take 1 capsule by mouth once daily 10/30/21   Alen Bleacher, MD  fluticasone [facility]) 50 MCG/ACT nasal spray Place 2 sprays into both nostrils daily. Patient not taking: Reported on 11/02/2021 02/18/20   Meccariello, Bernita Raisin, MD  glucose blood (AGAMATRIX  PRESTO TEST) test strip Check blood glucose twice daily before meals 08/07/17   Mack Hook, MD  hydrochlorothiazide (HYDRODIURIL) 25 MG tablet Take 1 tablet by mouth once daily 11/13/21   Alen Bleacher, MD  metFORMIN (GLUCOPHAGE-XR) 500 MG 24 hr tablet TAKE 1 TABLET BY MOUTH TWICE DAILY WITH A MEAL 07/03/21   Alen Bleacher, MD  metFORMIN (GLUCOPHAGE-XR) 500 MG 24 hr tablet TAKE 1 TABLET BY MOUTH TWICE DAILY WITH A MEAL 11/17/21   Alen Bleacher, MD  Multiple Vitamins-Minerals (HAIR/SKIN/NAILS) TABS Take 1 tablet by mouth daily.    [provider]  nitroGLYCERIN (NITROSTAT) 0.4 MG SL tablet 1 tab under tongue for chest pressure, may repeat every 5 minutes if not relieved for total of 3 doses. Patient not taking: Reported on 11/02/2021 08/07/17   Mack Hook, MD  ondansetron (ZOFRAN) 4 MG tablet Take 1 tablet (4 mg total) by mouth every 8 (eight) hours as needed for nausea or vomiting. Patient not taking: Reported on 11/02/2021 06/27/21   Erskine Emery, MD  rizatriptan (MAXALT) 10 MG tablet Take 10 mg by mouth as needed for migraine. May repeat in 2 hours if needed Patient not taking: Reported on 11/02/2021    [provider]  terbinafine (LAMISIL) 250 MG tablet Take 1 tablet (250 mg total) by mouth daily. Patient not taking: Reported on 11/02/2021 04/20/21   Edrick Kins, DPM    Current Outpatient Medications  Medication Sig Dispense Refill   AGAMATRIX ULTRA-THIN LANCETS MISC Check sugars twice daily before meals 100 each 11   amLODipine (NORVASC) 10 MG tablet Take 1 tablet (10 mg total) by mouth daily. 90 tablet 0   aspirin EC 81 MG tablet Take 1 tablet (81 mg total) by mouth daily.     atorvastatin (LIPITOR) 80 MG tablet Take 1 tablet by mouth once daily 90 tablet 0   Blood Glucose Monitoring Suppl (AGAMATRIX PRESTO) w/Device KIT Check sugars twice daily before meals 1 kit 0   Cholecalciferol (VITAMIN D3) 50 MCG (2000 UT) TABS Take 5,000 Units by mouth daily.       esomeprazole (NEXIUM) 40 MG capsule Take 1 capsule by mouth once daily 30 capsule 0   fluticasone (FLONASE) 50 MCG/ACT nasal spray Place 2 sprays into both nostrils daily. (Patient not taking: Reported on 11/02/2021) 16 g 3   glucose blood (AGAMATRIX PRESTO TEST) test strip Check blood glucose twice daily before meals 100 each 11   hydrochlorothiazide (HYDRODIURIL) 25 MG tablet Take 1 tablet by mouth once daily 90 tablet 0   metFORMIN (GLUCOPHAGE-XR) 500 MG 24 hr tablet TAKE 1 TABLET BY MOUTH TWICE DAILY WITH A MEAL 60 tablet 0   metFORMIN (GLUCOPHAGE-XR) 500 MG 24 hr tablet TAKE 1 TABLET BY MOUTH TWICE DAILY WITH A MEAL 60 tablet 0   Multiple Vitamins-Minerals (HAIR/SKIN/NAILS) TABS Take 1 tablet by mouth daily.     nitroGLYCERIN (NITROSTAT) 0.4  MG SL tablet 1 tab under tongue for chest pressure, may repeat every 5 minutes if not relieved for total of 3 doses. (Patient not taking: Reported on 11/02/2021) 24 tablet 3   ondansetron (ZOFRAN) 4 MG tablet Take 1 tablet (4 mg total) by mouth every 8 (eight) hours as needed for nausea or vomiting. (Patient not taking: Reported on 11/02/2021) 6 tablet 0   rizatriptan (MAXALT) 10 MG tablet Take 10 mg by mouth as needed for migraine. May repeat in 2 hours if needed (Patient not taking: Reported on 11/02/2021)     terbinafine (LAMISIL) 250 MG tablet Take 1 tablet (250 mg total) by mouth daily. (Patient not taking: Reported on 11/02/2021) 90 tablet 0   Current Facility-Administered Medications  Medication Dose Route Frequency Provider Last Rate Last Admin   0.9 %  sodium chloride infusion  500 mL Intravenous Once Sharyn Creamer, MD        Allergies as of 11/21/2021   (No Known Allergies)    Family History  Problem Relation Age of Onset   Alcohol abuse Mother    Diabetes Mother    Heart disease Mother    Cancer Mother    Esophageal cancer Father    Rectal cancer Father    Throat cancer Father        was a smoker   Colon cancer Father    Alcohol abuse  Father    Cancer Father 51       colon cancer   HIV Sister    Heart disease Sister 22       died age 68 "Heart was weak"   Stroke Sister    Diabetes Maternal Grandmother    Diabetes Paternal Grandmother    Dementia Paternal Grandmother    Hypertension Paternal Grandmother    Breast cancer Paternal Grandmother    Skin cancer Paternal Grandmother    Sudden Cardiac Death Neg Hx     Social History   Socioeconomic History   Marital status: Single    Spouse name: Not on file   Number of children: 4   Years of education: 11   Highest education level: Not on file  Occupational History   Occupation: unemployed    Fish farm manager: [employer]    Comment: house keeping  Tobacco Use   Smoking status: Former    Years: 15.00    Types: Cigarettes    Quit date: 2000    Years since quitting: 23.9   Smokeless tobacco: Never  Vaping Use   Vaping Use: Never used  Substance and Sexual Activity   Alcohol use: Not Currently    Comment: occasional   Drug use: Not Currently    Comment: per pt Last crack cocaine 1998   Sexual activity: Not on file  Other Topics Concern   Not on file  Social History Narrative   Single. Education: [employer].   Right-handed.   1-2 cups caffeine per day.   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   Social Determinants of Health   Financial Resource Strain: Not on file  Food Insecurity: Not on file  Transportation Needs: Not on file  Physical Activity: Inactive (07/21/2018)   Exercise Vital Sign    Days of Exercise per Week: 0 days    Minutes of Exercise per Session: 0 min  Stress: Not on file  Social Connections: Not on file  Intimate Partner Violence: Not on file    Physical Exam: Vital signs  in last 24 hours: BP 128/77   Pulse 83   Temp (!) 97.3 F (36.3 C)   Ht _0  (1.676 m)   Wt 233 lb (105.7 kg)   LMP 12/11/2013   SpO2 97%   BMI 37.61 kg/m  GEN: NAD EYE: Sclerae anicteric ENT: MMM CV:  Non-tachycardic Pulm: No increased work of breathing GI: Soft, NT/ND NEURO:  Alert & Oriented   Christia Reading, MD [facility]  11/21/2021 1:56 PM

## 2021-11-21 NOTE — Progress Notes (Signed)
Called to room to assist during endoscopic procedure.  Patient ID and intended procedure confirmed with present staff. Received instructions for my participation in the procedure from the performing physician.  

## 2021-11-21 NOTE — Progress Notes (Signed)
A and O x3. Report to RN. Tolerated MAC anesthesia well. 

## 2021-11-21 NOTE — Progress Notes (Signed)
Pt's states no medical or surgical changes since previsit or office visit. 

## 2021-11-21 NOTE — Op Note (Signed)
[place] Patient Name: Brianna Hawkins Procedure Date: 11/21/2021 2:25 PM MRN: [id number] Endoscopist: Adline Mango [place] , , [id number] Age: 61 Referring MD:  Date of Birth: 1960/09/10 Gender: Female Account #: [id number] Procedure:                Colonoscopy Indications:              Screening in patient at increased risk: Family                            history of 1st-degree relative with colorectal                            cancer before age 41 years Medicines:                Monitored Anesthesia Care Procedure:                Pre-Anesthesia Assessment:                           - Prior to the procedure, a History and Physical                            was performed, and patient medications and                            allergies were reviewed. The patient's tolerance of                            previous anesthesia was also reviewed. The risks                            and benefits of the procedure and the sedation                            options and risks were discussed with the patient.                            All questions were answered, and informed consent                            was obtained. Prior Anticoagulants: The patient has                            taken no anticoagulant or antiplatelet agents. ASA                            Grade Assessment: III - A patient with severe                            systemic disease. After reviewing the risks and                            benefits, the patient was deemed in satisfactory  condition to undergo the procedure.                           After obtaining informed consent, the colonoscope                            was passed under direct vision. Throughout the                            procedure, the patient's blood pressure, pulse, and                            oxygen saturations were monitored continuously. The                            Colonoscope was introduced  through the anus and                            advanced to the the terminal ileum. The colonoscopy                            was performed without difficulty. The patient                            tolerated the procedure well. The quality of the                            bowel preparation was good. The terminal ileum,                            ileocecal valve, appendiceal orifice, and rectum                            were photographed. Scope In: 2:40:02 PM Scope Out: 2:59:36 PM Scope Withdrawal Time: 0 hours 14 minutes 5 seconds  Total Procedure Duration: 0 hours 19 minutes 34 seconds  Findings:                 The terminal ileum appeared normal.                           Three sessile polyps were found in the ascending                            colon and cecum. The polyps were 3 to 5 mm in size.                            These polyps were removed with a cold snare.                            Resection and retrieval were complete.                           A 4 mm polyp was found in the sigmoid colon. The  polyp was sessile. The polyp was removed with a                            cold snare. Resection and retrieval were complete.                           Non-bleeding internal hemorrhoids were found during                            retroflexion. Complications:            No immediate complications. Estimated Blood Loss:     Estimated blood loss was minimal. Impression:               - The examined portion of the ileum was normal.                           - Three 3 to 5 mm polyps in the ascending colon and                            in the cecum, removed with a cold snare. Resected                            and retrieved.                           - One 4 mm polyp in the sigmoid colon, removed with                            a cold snare. Resected and retrieved.                           - Non-bleeding internal hemorrhoids. Recommendation:           -  Discharge patient to home (with escort).                           - Await pathology results.                           - The findings and recommendations were discussed                            with the patient. Dr Georgian Co "[name]" Lorenso Courier,  11/21/2021 3:01:51 PM

## 2021-11-21 NOTE — Patient Instructions (Signed)
Handout on polyps given to patient. Await pathology results. Resume previous diet and continue present medications.  Repeat colonoscopy for surveillance will be determined based off of pathology results.   YOU HAD AN ENDOSCOPIC PROCEDURE TODAY AT THE [facility]:   Refer to the procedure report that was given to you for any specific questions about what was found during the examination.  If the procedure report does not answer your questions, please call your gastroenterologist to clarify.  If you requested that your care partner not be given the details of your procedure findings, then the procedure report has been included in a sealed envelope for you to review at your convenience later.  YOU SHOULD EXPECT: Some feelings of bloating in the abdomen. Passage of more gas than usual.  Walking can help get rid of the air that was put into your GI tract during the procedure and reduce the bloating. If you had a lower endoscopy (such as a colonoscopy or flexible sigmoidoscopy) you may notice spotting of blood in your stool or on the toilet paper. If you underwent a bowel prep for your procedure, you may not have a normal bowel movement for a few days.  Please Note:  You might notice some irritation and congestion in your nose or some drainage.  This is from the oxygen used during your procedure.  There is no need for concern and it should clear up in a day or so.  SYMPTOMS TO REPORT IMMEDIATELY:  Following lower endoscopy (colonoscopy or flexible sigmoidoscopy):  Excessive amounts of blood in the stool  Significant tenderness or worsening of abdominal pains  Swelling of the abdomen that is new, acute  Fever of 100F or higher  For urgent or emergent issues, a gastroenterologist can be reached at any hour by calling [phone]. Do not use MyChart messaging for urgent concerns.    DIET:  We do recommend a small meal at first, but then you may proceed to your regular diet.  Drink  plenty of fluids but you should avoid alcoholic beverages for 24 hours.  ACTIVITY:  You should plan to take it easy for the rest of today and you should NOT DRIVE or use heavy machinery until tomorrow (because of the sedation medicines used during the test).    FOLLOW UP: Our staff will call the number listed on your records the next business day following your procedure.  We will call around 7:15- 8:00 am to check on you and address any questions or concerns that you may have regarding the information given to you following your procedure. If we do not reach you, we will leave a message.     If any biopsies were taken you will be contacted by phone or by letter within the next 1-3 weeks.  Please call us at [phone] if you have not heard about the biopsies in 3 weeks.    SIGNATURES/CONFIDENTIALITY: You and/or your care partner have signed paperwork which will be entered into your electronic medical record.  These signatures attest to the fact that that the information above on your After Visit Summary has been reviewed and is understood.  Full responsibility of the confidentiality of this discharge information lies with you and/or your care-partner. 

## 2021-11-22 ENCOUNTER — Telehealth: Payer: Self-pay | Admitting: *Deleted

## 2021-11-22 ENCOUNTER — Telehealth: Payer: Self-pay | Admitting: Internal Medicine

## 2021-11-22 NOTE — Telephone Encounter (Signed)
Inbound call from patient stating she was here yesterday for a procedure with Dr. Lorenso Courier and needs a Doctors note. Please advise.

## 2021-11-22 NOTE — Telephone Encounter (Signed)
  Follow up Call-     11/21/2021    1:53 PM  Call back number  Post procedure Call Back phone  # [phone]  Permission to leave phone message Yes     Patient questions:  Do you have a fever, pain , or abdominal swelling? No. Pain Score  0 *  Have you tolerated food without any problems? Yes.    Have you been able to return to your normal activities? Yes.    Do you have any questions about your discharge instructions: Diet   No. Medications  No. Follow up visit  No.  Do you have questions or concerns about your Care? No.  Actions: * If pain score is 4 or above: No action needed, pain <4.

## 2021-11-22 NOTE — Telephone Encounter (Signed)
Work note created and sent to patient MyChart.  Called patient and let her know it was accessible there.  Pt verbalized understanding.

## 2021-11-23 ENCOUNTER — Other Ambulatory Visit: Payer: Self-pay | Admitting: Student

## 2021-11-30 ENCOUNTER — Encounter: Payer: Self-pay | Admitting: Internal Medicine

## 2021-12-05 ENCOUNTER — Other Ambulatory Visit: Payer: Medicare Other

## 2021-12-12 ENCOUNTER — Other Ambulatory Visit: Payer: Self-pay | Admitting: Student

## 2021-12-30 ENCOUNTER — Other Ambulatory Visit: Payer: Self-pay | Admitting: Student

## 2022-01-04 ENCOUNTER — Other Ambulatory Visit: Payer: Medicare Other

## 2022-01-08 ENCOUNTER — Other Ambulatory Visit: Payer: Self-pay | Admitting: Student

## 2022-01-08 DIAGNOSIS — I10 Essential (primary) hypertension: Secondary | ICD-10-CM

## 2022-01-25 ENCOUNTER — Ambulatory Visit
Admission: RE | Admit: 2022-01-25 | Discharge: 2022-01-25 | Disposition: A | Discharge status: Home / Self Care | Payer: Medicare Other | Source: Ambulatory Visit | Attending: Physician Assistant | Admitting: Physician Assistant

## 2022-01-25 DIAGNOSIS — I2510 Atherosclerotic heart disease of native coronary artery without angina pectoris: Secondary | ICD-10-CM

## 2022-01-25 DIAGNOSIS — E7800 Pure hypercholesterolemia, unspecified: Secondary | ICD-10-CM

## 2022-01-25 DIAGNOSIS — I10 Essential (primary) hypertension: Secondary | ICD-10-CM

## 2022-01-26 ENCOUNTER — Telehealth: Payer: Self-pay | Admitting: Physician Assistant

## 2022-01-26 DIAGNOSIS — E7800 Pure hypercholesterolemia, unspecified: Secondary | ICD-10-CM

## 2022-01-26 DIAGNOSIS — Z79899 Other long term (current) drug therapy: Secondary | ICD-10-CM

## 2022-01-26 MED ORDER — EZETIMIBE 10 MG PO TABS: 10.0000 mg | ORAL_TABLET | Freq: Every day | ORAL | 3 refills | Status: DC

## 2022-01-26 NOTE — Telephone Encounter (Signed)
Spoke with patient and discussed lab results.  Per Richardson Dopp, PA-C: See MyChart comment.  PLAN:  -Start Zetia 10 mg once daily  -Lipids, LFTs 3 mos   Ms. Koslosky  Your calcium score has increased since 2019. It is still low at 43 but puts you in the 87th percentile.I reviewed your last Lipid panel in 05/2021. Your LDL was 97. Because of your calcium score, we should be aggressive at getting your cholesterol lower. The LDL should at least be less than 70. Continue with Atorvastatin 80 mg once daily. I will add a medication called    Ezetimibe (Zetia) 10 mg once daily and recheck your lipids in 2-3 mos. Continue taking the aspirin 81 mg once daily. Other measures to continue will be to keep blood pressure controlled (<130/80), diabetes controlled, not smoking, diet and exercise (at least 150 minutes of aerobic activity weekly).  Richardson Dopp, PA-C    01/26/2022 12:33 PM      Zetia '10mg'$  daily sent to pharmacy of choice. LFTs, Lipids ordered, lab appt scheduled for 05/01/2022.  Discussed diet: patient states she eats steak twice a week and a dozen eggs per week. Encouraged patient to reduce intake of red meat, to 1-2 times per month and reduce consumption of eggs or use egg whites vs whole egg with yolk.  Patient verbalized understanding of the above and expressed appreciation for call.

## 2022-01-26 NOTE — Telephone Encounter (Signed)
-----  Message from Liliane Shi, [place] sent at 01/26/2022 12:41 PM EST ----- Results sent to [name] via [facility]. See MyChart comment. PLAN:  -Start Zetia 10 mg once daily -Lipids, LFTs 3 mos  Ms. Lomas  Your calcium score has increased since 2019. It is still low at 58 but puts you in the 87th percentile.I reviewed your last Lipid panel in 05/2021. Your LDL was 97. Because of your calcium score, we should be aggressive at getting your cholesterol lower. The LDL should at least be less than 70. Continue with Atorvastatin 80 mg once daily. I will add a medication called    Ezetimibe (Zetia) 10 mg once daily and recheck your lipids in 2-3 mos. Continue taking the aspirin 81 mg once daily. Other measures to continue will be to keep blood pressure controlled (<130/80), diabetes controlled, not smoking, diet and exercise (at least 150 minutes of aerobic activity weekly).  Richardson Dopp, PA-C    01/26/2022 12:33 PM

## 2022-02-10 ENCOUNTER — Other Ambulatory Visit: Payer: Self-pay | Admitting: Student

## 2022-02-27 ENCOUNTER — Encounter: Payer: Self-pay | Admitting: Student

## 2022-03-09 ENCOUNTER — Telehealth: Payer: Self-pay | Admitting: Student

## 2022-03-09 NOTE — Telephone Encounter (Signed)
Brianna Hawkins to schedule their annual wellness visit. Appointment made for 03/20/2022.  Thank you,  [place] Direct dial  [phone]

## 2022-03-19 NOTE — Patient Instructions (Signed)

## 2022-03-19 NOTE — Progress Notes (Unsigned)
I connected with  Brianna Hawkins on 03/20/2022 by a audio enabled telemedicine application and verified that I am speaking with the correct person using two identifiers.  Patient Location: Home  Provider Location: Home Office  I discussed the limitations of evaluation and management by telemedicine. The patient expressed understanding and agreed to proceed.   Subjective:   Brianna Hawkins is a 62 y.o. female who presents for an Initial Medicare Annual Wellness Visit.  Review of Systems    Per HPI unless specifically indicated below.       Objective:       11/21/2021    3:21 PM 11/21/2021    3:11 PM 11/21/2021    3:01 PM  Vitals with BMI  Systolic [id number]  Diastolic 77 81 58  Pulse 72 86 73    Today's Vitals   03/20/22 0942  PainSc: 7    There is no height or weight on file to calculate BMI.     10/13/2021    9:45 AM 10/03/2021    2:04 PM 06/23/2021   10:37 AM 06/08/2021    1:42 PM 05/19/2021    9:03 AM 10/05/2020    7:45 AM 07/07/2020    8:52 AM  Advanced Directives  Does Patient Have a Medical Advance Directive? No No No No No No No  Would patient like information on creating a medical advance directive? No - Patient declined No - Patient declined Yes (MAU/Ambulatory/Procedural Areas - Information given)  No - Patient declined No - Patient declined No - Patient declined    Current Medications (verified) Outpatient Encounter Medications as of 03/20/2022  Medication Sig   AGAMATRIX ULTRA-THIN LANCETS MISC Check sugars twice daily before meals   amLODipine (NORVASC) 10 MG tablet Take 1 tablet by mouth once daily   aspirin EC 81 MG tablet Take 1 tablet (81 mg total) by mouth daily.   atorvastatin (LIPITOR) 80 MG tablet Take 1 tablet by mouth once daily   Blood Glucose Monitoring Suppl (AGAMATRIX PRESTO) w/Device KIT Check sugars twice daily before meals   Cholecalciferol (VITAMIN D3) 50 MCG (2000 UT) TABS Take 5,000 Units by mouth daily.    esomeprazole  (NEXIUM) 40 MG capsule Take 1 capsule by mouth once daily   fluticasone (FLONASE) 50 MCG/ACT nasal spray Place 2 sprays into both nostrils daily.   glucose blood (AGAMATRIX PRESTO TEST) test strip Check blood glucose twice daily before meals   hydrochlorothiazide (HYDRODIURIL) 25 MG tablet Take 1 tablet by mouth once daily   metFORMIN (GLUCOPHAGE-XR) 500 MG 24 hr tablet TAKE 1 TABLET BY MOUTH TWICE DAILY WITH A MEAL   metFORMIN (GLUCOPHAGE-XR) 500 MG 24 hr tablet TAKE 1 TABLET BY MOUTH TWICE DAILY WITH A MEAL   Multiple Vitamins-Minerals (HAIR/SKIN/NAILS) TABS Take 1 tablet by mouth daily.   nitroGLYCERIN (NITROSTAT) 0.4 MG SL tablet 1 tab under tongue for chest pressure, may repeat every 5 minutes if not relieved for total of 3 doses.   ondansetron (ZOFRAN) 4 MG tablet Take 1 tablet (4 mg total) by mouth every 8 (eight) hours as needed for nausea or vomiting.   rizatriptan (MAXALT) 10 MG tablet Take 10 mg by mouth as needed for migraine. May repeat in 2 hours if needed   terbinafine (LAMISIL) 250 MG tablet Take 1 tablet (250 mg total) by mouth daily.   ezetimibe (ZETIA) 10 MG tablet Take 1 tablet (10 mg total) by mouth daily. (Patient not taking: Reported on 03/20/2022)   No  facility-administered encounter medications on file as of 03/20/2022.    Allergies (verified) Patient has no known allergies.   History: Past Medical History:  Diagnosis Date   Coronary artery calcification seen on CT scan    cardiologist-- dr Cathie Olden; Coronary CTA 9/19: Ca score = 1 (80th percentile); calcified plaque distal LM; no obstructive CAD.   DM2 (diabetes mellitus, type 2) ([place])    followed by pcp-- (09-29-2020 pt stated check blood sugar twice wkly in am,  fasting sugar--- unsure per pt)   DOE (dyspnea on exertion)    09-29-2020  per pt sob w/ any activity , especially stairs, stated uses rescue inhaler and recovers quickly   Endometrial polyp    Hysteroscopy D & C 10/22   GERD (gastroesophageal reflux  disease)    H/O: substance abuse ([place])    per pt last used crack cocaine in 1998   Heart murmur    last echo in epic 03-26-2019, ef 50-55%, trivial TR   History of syncope 2014   vasovagal   History of treatment for tuberculosis 1998   per pt treated in 1998;  pt had bronchoscopy w/ bx's RUL 10-11-2005 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   HLD (hyperlipidemia)    Hypertension    Migraine    OA (osteoarthritis)    PMB (postmenopausal bleeding)    Postmenopausal bleeding 07/01/2020   Tuberculosis 01/02/1996   7 YEARS AGO   Urinary urgency    Vertigo    Past Surgical History:  Procedure Laterality Date   COLONOSCOPY WITH ESOPHAGOGASTRODUODENOSCOPY (EGD)  09/05/2011   by dr Sharlett Iles   ETHMOIDECTOMY Left 07/10/2013   Procedure: ETHMOIDECTOMY LEFT,MAXILLARY OSTIAL ENLARGEMENT WITH REMOVAL OF DISEASE;  Surgeon: Rozetta Nunnery, MD;  Location: [facility];  Service: ENT;  Laterality: Left;   HYSTEROSCOPY WITH D & C N/A 10/05/2020   Procedure: DILATATION AND CURETTAGE /HYSTEROSCOPY;  Surgeon: [name], MD;  Location: [facility];  Service: Gynecology;  Laterality: N/A;   MYOMECTOMY     NASAL POLYP SURGERY     SOFT TISSUE MASS EXCISION Right 05/17/2008   @[facility] ;  right axilla  (benign)   TUBAL LIGATION Bilateral    1980s   TURBINATE REDUCTION Left 07/10/2013   Procedure: LEFT TURBINATE REDUCTION;  Surgeon: Rozetta Nunnery, MD;  Location: [facility];  Service: ENT;  Laterality: Left;   Family History  Problem Relation Age of Onset   Alcohol abuse Mother    Diabetes Mother    Heart disease Mother    Cancer Mother    Esophageal cancer Father    Rectal cancer Father    Throat cancer Father        was a smoker   Colon cancer Father    Alcohol abuse Father    Cancer Father 87       colon cancer   HIV Sister    Heart disease Sister 33       died age 22 "Heart was weak"   Stroke Sister    Stomach cancer Maternal  Aunt    Diabetes Maternal Grandmother    Diabetes Paternal Grandmother    Dementia Paternal Grandmother    Hypertension Paternal Grandmother    Breast cancer Paternal Grandmother    Skin cancer Paternal Grandmother    Sudden Cardiac Death Neg Hx    Social History   Socioeconomic History   Marital status: Married    Spouse name: Not on file  Number of children: 4   Years of education: 11   Highest education level: Not on file  Occupational History   Occupation: unemployed    Employer: [employer]    Comment: house keeping   Occupation: [place] Use   Smoking status: Former    Years: 15    Types: Cigarettes    Quit date: 2000    Years since quitting: 24.2   Smokeless tobacco: Never  Vaping Use   Vaping Use: Never used  Substance and Sexual Activity   Alcohol use: Not Currently    Comment: occasional   Drug use: Not Currently    Comment: per pt Last crack cocaine 1998   Sexual activity: Not on file  Other Topics Concern   Not on file  Social History Narrative   Single. Education: [employer].   Right-handed.   1-2 cups caffeine per day.   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   Social Determinants of Health   Financial Resource Strain: Low Risk  (03/20/2022)   Overall Financial Resource Strain (CARDIA)    Difficulty of Paying Living Expenses: Not hard at all  Food Insecurity: No Food Insecurity (03/20/2022)   Hunger Vital Sign    Worried About Running Out of Food in the Last Year: Never true    Ran Out of Food in the Last Year: Never true  Transportation Needs: No Transportation Needs (03/20/2022)   PRAPARE - Hydrologist (Medical): No    Lack of Transportation (Non-Medical): No  Physical Activity: Insufficiently Active (03/20/2022)   Exercise Vital Sign    Days of Exercise per Week: 2 days    Minutes of Exercise per Session: 30 min  Stress: No Stress Concern Present (03/20/2022)    [place]    Feeling of Stress : Only a little  Social Connections: Moderately Integrated (03/20/2022)   Social Connection and Isolation Panel [NHANES]    Frequency of Communication with Friends and Family: More than three times a week    Frequency of Social Gatherings with Friends and Family: Never    Attends Religious Services: 1 to 4 times per year    Active Member of [employer] or Organizations: No    Attends Music therapist: Never    Marital Status: Married    Tobacco Counseling Counseling given: No   Clinical Intake:  Pre-visit preparation completed: No  Pain : 0-10 Pain Score: 7  Pain Type: Acute pain Pain Location: Arm Pain Orientation: Right Pain Radiating Towards: shoulder to the elbow Pain Descriptors / Indicators: Aching, Throbbing Pain Onset: More than a month ago Pain Relieving Factors: Baclofen , Naproxen  Pain Relieving Factors: Baclofen , Naproxen  Nutritional Status: BMI > 30  Obese Nutritional Risks: None Diabetes: Yes CBG done?: No Did pt. bring in CBG monitor from home?: No  How often do you need to have someone help you when you read instructions, pamphlets, or other written materials from your doctor or pharmacy?: 1 - Never  Diabetic?Nutrition Risk Assessment:  Has the patient had any N/V/D within the last 2 months?  No  Does the patient have any non-healing wounds?  No  Has the patient had any unintentional weight loss or weight gain?  No   Diabetes:  Is the patient diabetic?  Yes  If diabetic, was a CBG obtained today?  No  Did the patient bring in their  glucometer from home?  No  How often do you monitor your CBG's? occasionally.   Financial Strains and Diabetes Management:  Are you having any financial strains with the device, your supplies or your medication? No .  Does the patient want to be seen by Chronic Care Management for management of their diabetes?   No  Would the patient like to be referred to a Nutritionist or for Diabetic Management?  No   Diabetic Exams:  Diabetic Eye Exam: Overdue for diabetic eye exam. Pt has been advised about the importance in completing this exam. Patient advised to call and schedule an eye exam. Diabetic Foot Exam: Completed 10/13/2021    Interpreter Needed?: No  Information entered by :: Donnie Mesa, [place]   Activities of Daily Living    03/20/2022    9:40 AM  In your present state of health, do you have any difficulty performing the following activities:  Hearing? 0  Vision? 1  Difficulty concentrating or making decisions? 0  Walking or climbing stairs? 1  Dressing or bathing? 0  Doing errands, shopping? 0    Patient Care Team: Alen Bleacher, MD as PCP - General (Family Medicine) Nahser, Wonda Cheng, MD as PCP - Cardiology (Cardiology) Sharmon Revere as Physician Assistant (Cardiology)  Indicate any recent Medical Services you may have received from other than [facility] providers in the past year (date may be approximate).     Assessment:   This is a routine wellness examination for Brianna Hawkins.  Hearing/Vision screen Denies any hearing issues. Denies any vision changes. Overdue Annual Eye Exam   Dietary issues and exercise activities discussed: Current Exercise Habits: Home exercise routine;Structured exercise class, Type of exercise: treadmill, Time (Minutes): 30, Frequency (Times/Week): 2, Weekly Exercise (Minutes/Week): 60, Intensity: Mild, Exercise limited by: orthopedic condition(s)   Goals Addressed   None    Depression Screen    03/20/2022    9:40 AM 10/13/2021    9:45 AM 10/03/2021    2:04 PM 06/23/2021   10:37 AM 06/08/2021    1:41 PM 05/11/2021   11:43 AM 04/11/2021   11:28 AM  PHQ 2/9 Scores  PHQ - 2 Score 0 1 1 0 1 2 0  PHQ- 9 Score  3 3 2 4 6 6     Fall Risk    03/20/2022    9:40 AM 07/01/2020    9:05 AM 11/12/2018    9:00 AM 08/11/2018    3:33 PM 07/21/2018    3:53 PM   Fall Risk   Falls in the past year? 0 0 0 0 0  Number falls in past yr: 0 0 0 0   Injury with Fall? 0 0     Risk for fall due to : No Fall Risks      Follow up Falls evaluation completed  Falls evaluation completed Falls evaluation completed     [place]:  Any stairs in or around the home? No  If so, are there any without handrails? No  Home free of loose throw rugs in walkways, pet beds, electrical cords, etc? Yes  Adequate lighting in your home to reduce risk of falls? Yes   ASSISTIVE DEVICES UTILIZED TO PREVENT FALLS:  Life alert? No  Use of a cane, walker or w/c? No  Grab bars in the bathroom? No  Shower chair or bench in shower? No  Elevated toilet seat or a handicapped toilet? No   TIMED UP AND GO:  Was  the test performed? Unable to perform, virtual appointment    Cognitive Function:        03/20/2022    9:41 AM  6CIT Screen  What Year? 0 points  What month? 0 points  What time? 0 points  Count back from 20 0 points  Months in reverse 0 points  Repeat phrase 2 points  Total Score 2 points    Immunizations Immunization History  Administered Date(s) Administered   Influenza Inj Mdck Quad With Preservative 11/19/2016   Influenza Split 11/01/2013   Influenza Whole 10/02/2010   Influenza,inj,Quad PF,6+ Mos 11/07/2017, 10/13/2021   Influenza-Unspecified 11/26/2016, 09/24/2021   Janssen (J&J) SARS-COV-2 Vaccination 03/16/2019, 11/04/2019   Pfizer Covid-19 Vaccine Bivalent Booster 84yrs & up 10/21/2020   Tdap 11/07/2017   Zoster, Unspecified 07/04/2021, 09/24/2021    TDAP status: Up to date  Flu Vaccine status: Up to date  Pneumococcal vaccine status: Up to date  Covid-19 vaccine status: Information provided on how to obtain vaccines.   Qualifies for Shingles Vaccine? Yes   Zostavax completed Yes   Shingrix Completed?: No.    Education has been provided regarding the importance of this vaccine. Patient has been  advised to call insurance company to determine out of pocket expense if they have not yet received this vaccine. Advised may also receive vaccine at local pharmacy or Health Dept. Verbalized acceptance and understanding.  Screening Tests Health Maintenance  Topic Date Due   OPHTHALMOLOGY EXAM  Never done   Zoster Vaccines- Shingrix (1 of 2) 12/17/1981   PAP SMEAR-Modifier  08/10/2021   COVID-19 Vaccine (4 - 2023-24 season) 09/01/2021   MAMMOGRAM  02/08/2022   HEMOGLOBIN A1C  04/14/2022   Diabetic kidney evaluation - eGFR measurement  05/10/2022   Diabetic kidney evaluation - Urine ACR  10/14/2022   FOOT EXAM  10/14/2022   Medicare Annual Wellness (AWV)  03/20/2023   COLONOSCOPY (Pts 45-51yrs Insurance coverage will need to be confirmed)  11/22/2026   DTaP/Tdap/Td (2 - Td or Tdap) 11/08/2027   INFLUENZA VACCINE  Completed   Hepatitis C Screening  Completed   HIV Screening  Completed   HPV VACCINES  Aged Out    Health Maintenance  Health Maintenance Due  Topic Date Due   OPHTHALMOLOGY EXAM  Never done   Zoster Vaccines- Shingrix (1 of 2) 12/17/1981   PAP SMEAR-Modifier  08/10/2021   COVID-19 Vaccine (4 - 2023-24 season) 09/01/2021   MAMMOGRAM  02/08/2022    Colorectal cancer screening: Type of screening: Colonoscopy. Completed 11/21/21. Repeat every 5 years  Mammogram: Overdue  DEXA Scan: not applicable   Lung Cancer Screening: (Low Dose CT Chest recommended if Age 19-80 years, 30 pack-year currently smoking OR have quit w/in 15years.) does not qualify.   Lung Cancer Screening Referral: not applicable  Additional Screening:  Hepatitis C Screening: does qualify; Completed 01//11/2020  Vision Screening: Recommended annual ophthalmology exams for early detection of glaucoma and other disorders of the eye. Is the patient up to date with their annual eye exam?  No  Who is the provider or what is the name of the office in which the patient attends annual eye exams?  If pt  is not established with a provider, would they like to be referred to a provider to establish care? No .   Dental Screening: Recommended annual dental exams for proper oral hygiene  Community Resource Referral / Chronic Care Management: CRR required this visit?  No   CCM required this visit?  No  Plan:     I have personally reviewed and noted the following in the patient's chart:   Medical and social history Use of alcohol, tobacco or illicit drugs  Current medications and supplements including opioid prescriptions. Patient is not currently taking opioid prescriptions. Functional ability and status Nutritional status Physical activity Advanced directives List of other physicians Hospitalizations, surgeries, and ER visits in previous 12 months Vitals Screenings to include cognitive, depression, and falls Referrals and appointments  In addition, I have reviewed and discussed with patient certain preventive protocols, quality metrics, and best practice recommendations. A written personalized care plan for preventive services as well as general preventive health recommendations were provided to patient.    Brianna Hawkins , Thank you for taking time to come for your Medicare Wellness Visit. I appreciate your ongoing commitment to your health goals. Please review the following plan we discussed and let me know if I can assist you in the future.   These are the goals we discussed:  Goals      mental health support     CARE PLAN ENTRY (see longitudinal plan of care for additional care plan information) Current Barriers:  Patient with DMII, history of depression and caregiver stress Acknowledges deficits with connecting to mental health provider for ongoing counseling.  Patient is experiencing symptoms of stress which seem to be exacerbated being a caregiver.     Patient needs Support, Education, and Care Coordination in order to meet unmet mental health needs  Lacks knowledge of  community resource: to assist with caring for loved one Patient has not been able to connect to the resources provided but will call Clinical Social Work Goal(s):  Over the next 30 days, patient will work with SW by telephone to reduce or manage symptoms of stress until connected for ongoing counseling resources.  Patient will implement clinical interventions discussed today to decreases symptoms of stress and increase knowledge and/or ability of: coping skills and stress reduction. Interventions:  Assessed patient's care coordination needs  Reviewed community support options for loved one ( PACE, [employer] Program, and CAP)  Other interventions include:emotional support and Solution-Focused Strategies  Patient Self Care Activities & Deficits:  Patient reports she is able to independently navigate community resource options without care coordination support Patient is able to implement clinical interventions discussed today and is motivated for treatment  Patient will select one of the agencies from the list provided and call to schedule an appointment  Initial goal documentation         This is a list of the screening recommended for you and due dates:  Health Maintenance  Topic Date Due   Eye exam for diabetics  Never done   Zoster (Shingles) Vaccine (1 of 2) 12/17/1981   Pap Smear  08/10/2021   COVID-19 Vaccine (4 - 2023-24 season) 09/01/2021   Mammogram  02/08/2022   Hemoglobin A1C  04/14/2022   Yearly kidney function blood test for diabetes  05/10/2022   Yearly kidney health urinalysis for diabetes  10/14/2022   Complete foot exam   10/14/2022   Medicare Annual Wellness Visit  03/20/2023   Colon Cancer Screening  11/22/2026   DTaP/Tdap/Td vaccine (2 - Td or Tdap) 11/08/2027   Flu Shot  Completed   Hepatitis C Screening: USPSTF Recommendation to screen - Ages 18-79 yo.  Completed   HIV Screening  Completed   HPV Vaccine  Aged [address],  [place]   03/20/2022  Nurse Notes: Approximately 30 minute Non-Face -To-Face Medicare Wellness Visit

## 2022-03-20 ENCOUNTER — Ambulatory Visit (INDEPENDENT_AMBULATORY_CARE_PROVIDER_SITE_OTHER): Payer: Medicare Other

## 2022-03-20 DIAGNOSIS — Z0000 Encounter for general adult medical examination without abnormal findings: Secondary | ICD-10-CM | POA: Diagnosis not present

## 2022-04-05 ENCOUNTER — Other Ambulatory Visit: Payer: Self-pay | Admitting: Student

## 2022-04-05 DIAGNOSIS — I10 Essential (primary) hypertension: Secondary | ICD-10-CM

## 2022-05-01 ENCOUNTER — Ambulatory Visit: Payer: Medicare Other | Attending: Physician Assistant

## 2022-05-01 DIAGNOSIS — Z79899 Other long term (current) drug therapy: Secondary | ICD-10-CM

## 2022-05-01 DIAGNOSIS — E7800 Pure hypercholesterolemia, unspecified: Secondary | ICD-10-CM | POA: Diagnosis not present

## 2022-05-01 LAB — LIPID PANEL
Chol/HDL Ratio: 2.9 ratio (ref 0.0–4.4)
Cholesterol, Total: 126 mg/dL (ref 100–199)
HDL: 44 mg/dL (ref >39)
LDL Chol Calc (NIH): 70 mg/dL (ref 0–99)
Triglycerides: 53 mg/dL (ref 0–149)
VLDL Cholesterol Cal: 12 mg/dL (ref 5–40)

## 2022-05-01 LAB — HEPATIC FUNCTION PANEL
ALT: 18 IU/L (ref 0–32)
AST: 17 IU/L (ref 0–40)
Albumin: 4.2 g/dL (ref 3.8–4.9)
Alkaline Phosphatase: 122 IU/L — ABNORMAL HIGH (ref 44–121)
Bilirubin Total: 0.4 mg/dL (ref 0.0–1.2)
Bilirubin, Direct: 0.15 mg/dL (ref 0.00–0.40)
Total Protein: 6.8 g/dL (ref 6.0–8.5)

## 2022-05-02 ENCOUNTER — Telehealth: Payer: Self-pay | Admitting: *Deleted

## 2022-05-02 DIAGNOSIS — E7800 Pure hypercholesterolemia, unspecified: Secondary | ICD-10-CM

## 2022-05-02 NOTE — Telephone Encounter (Signed)
-----   Message from Beatrice Lecher, [place] sent at 05/02/2022  1:52 PM EDT ----- Results sent to Brianna Hawkins via MyChart. See MyChart comments below. PLAN:  -Repeat fasting Lipids in 6 mos  Brianna Hawkins  Your liver enzymes (AST, ALT) are normal. Your LDL cholesterol is better at 70. I would like to get it to less than 70 over time. Since it is currently 40, let's continue your current medications. I will recheck it again in 6 mos to see if it improves further.  Tereso Newcomer, PA-C

## 2022-05-04 DIAGNOSIS — E7800 Pure hypercholesterolemia, unspecified: Secondary | ICD-10-CM

## 2022-05-04 NOTE — Telephone Encounter (Signed)
Stop Atorvastatin Start Rosuvastatin 10 mg once daily  Lipids, LFTs in 3 mos Tereso Newcomer, [place]    05/04/2022 12:35 PM

## 2022-05-07 MED ORDER — ROSUVASTATIN CALCIUM 10 MG PO TABS: 10.0000 mg | ORAL_TABLET | Freq: Every day | ORAL | 3 refills | Status: DC

## 2022-05-09 ENCOUNTER — Other Ambulatory Visit: Payer: Self-pay | Admitting: Student

## 2022-05-13 ENCOUNTER — Other Ambulatory Visit: Payer: Self-pay | Admitting: Student

## 2022-05-15 DIAGNOSIS — F325 Major depressive disorder, single episode, in full remission: Secondary | ICD-10-CM | POA: Diagnosis not present

## 2022-05-15 DIAGNOSIS — E118 Type 2 diabetes mellitus with unspecified complications: Secondary | ICD-10-CM | POA: Diagnosis not present

## 2022-05-15 DIAGNOSIS — Z79899 Other long term (current) drug therapy: Secondary | ICD-10-CM | POA: Diagnosis not present

## 2022-05-15 DIAGNOSIS — I10 Essential (primary) hypertension: Secondary | ICD-10-CM | POA: Diagnosis not present

## 2022-05-15 DIAGNOSIS — K219 Gastro-esophageal reflux disease without esophagitis: Secondary | ICD-10-CM | POA: Diagnosis not present

## 2022-05-15 DIAGNOSIS — G4733 Obstructive sleep apnea (adult) (pediatric): Secondary | ICD-10-CM | POA: Diagnosis not present

## 2022-05-15 DIAGNOSIS — G43709 Chronic migraine without aura, not intractable, without status migrainosus: Secondary | ICD-10-CM | POA: Diagnosis not present

## 2022-05-15 DIAGNOSIS — E1169 Type 2 diabetes mellitus with other specified complication: Secondary | ICD-10-CM | POA: Diagnosis not present

## 2022-05-15 DIAGNOSIS — E782 Mixed hyperlipidemia: Secondary | ICD-10-CM | POA: Diagnosis not present

## 2022-05-15 DIAGNOSIS — K635 Polyp of colon: Secondary | ICD-10-CM | POA: Diagnosis not present

## 2022-05-15 DIAGNOSIS — I209 Angina pectoris, unspecified: Secondary | ICD-10-CM | POA: Diagnosis not present

## 2022-05-30 ENCOUNTER — Other Ambulatory Visit: Payer: Self-pay | Admitting: Student

## 2022-06-06 ENCOUNTER — Other Ambulatory Visit: Payer: Self-pay | Admitting: Student

## 2022-07-19 ENCOUNTER — Other Ambulatory Visit: Payer: Self-pay | Admitting: Nurse Practitioner

## 2022-07-19 ENCOUNTER — Ambulatory Visit: Payer: Medicare HMO

## 2022-07-19 DIAGNOSIS — Z1231 Encounter for screening mammogram for malignant neoplasm of breast: Secondary | ICD-10-CM

## 2022-07-24 ENCOUNTER — Ambulatory Visit
Admission: RE | Admit: 2022-07-24 | Discharge: 2022-07-24 | Disposition: A | Discharge status: Home / Self Care | Payer: Medicare HMO | Source: Ambulatory Visit | Attending: Nurse Practitioner | Admitting: Nurse Practitioner

## 2022-07-24 DIAGNOSIS — Z1231 Encounter for screening mammogram for malignant neoplasm of breast: Secondary | ICD-10-CM

## 2022-08-17 ENCOUNTER — Ambulatory Visit: Payer: Medicare HMO

## 2022-08-21 ENCOUNTER — Other Ambulatory Visit: Payer: Medicare HMO

## 2022-08-24 ENCOUNTER — Ambulatory Visit: Payer: Medicare HMO

## 2022-08-28 ENCOUNTER — Ambulatory Visit: Payer: Medicare HMO | Attending: Physician Assistant

## 2022-08-28 DIAGNOSIS — E7800 Pure hypercholesterolemia, unspecified: Secondary | ICD-10-CM

## 2022-08-29 ENCOUNTER — Telehealth: Payer: Self-pay | Admitting: *Deleted

## 2022-08-29 DIAGNOSIS — Z79899 Other long term (current) drug therapy: Secondary | ICD-10-CM

## 2022-08-29 LAB — LIPID PANEL
Chol/HDL Ratio: 2.8 ratio (ref 0.0–4.4)
Cholesterol, Total: 139 mg/dL (ref 100–199)
HDL: 50 mg/dL (ref >39)
LDL Chol Calc (NIH): 79 mg/dL (ref 0–99)
Triglycerides: 43 mg/dL (ref 0–149)
VLDL Cholesterol Cal: 10 mg/dL (ref 5–40)

## 2022-08-29 LAB — HEPATIC FUNCTION PANEL
ALT: 14 IU/L (ref 0–32)
AST: 18 IU/L (ref 0–40)
Albumin: 4.1 g/dL (ref 3.8–4.9)
Alkaline Phosphatase: 108 IU/L (ref 44–121)
Bilirubin Total: 0.4 mg/dL (ref 0.0–1.2)
Bilirubin, Direct: 0.12 mg/dL (ref 0.00–0.40)
Total Protein: 6.5 g/dL (ref 6.0–8.5)

## 2022-08-29 MED ORDER — ROSUVASTATIN CALCIUM 20 MG PO TABS: 20.0000 mg | ORAL_TABLET | Freq: Every day | ORAL | 3 refills | Status: AC

## 2022-08-29 NOTE — Telephone Encounter (Signed)
-----   Message from Tereso Newcomer sent at 08/29/2022 10:27 AM EDT ----- Results sent to Merlinda Frederick via MyChart. See MyChart comments below. PLAN:  -If she has been doing ok with Crestor 10 >> Increase to Crestor 20 mg once daily and repeat Lipids, ALT in 3 mos. -If she has had side effects to Crestor 10 >> Refer to PharmD Lipid clinic for possible PCSK9 inhib.  Ms. Bonn  Your LDL cholesterol is still above goal. Your liver enzymes (AST, ALT) are normal. We have 2 options to get your LDL cholesterol to goal. We can (a) increase Rosuvastatin to 20 mg once daily, if you have been tolerating it ok or (b) send you to our lipid clinic, if you have been having side effects to the Rosuvastatin. I will have the nurse call you. Tereso Newcomer, PA-C

## 2022-10-22 ENCOUNTER — Ambulatory Visit (INDEPENDENT_AMBULATORY_CARE_PROVIDER_SITE_OTHER): Payer: No Typology Code available for payment source | Admitting: Surgical

## 2022-10-22 ENCOUNTER — Encounter: Payer: Self-pay | Admitting: Surgical

## 2022-10-22 ENCOUNTER — Other Ambulatory Visit (INDEPENDENT_AMBULATORY_CARE_PROVIDER_SITE_OTHER): Payer: No Typology Code available for payment source

## 2022-10-22 DIAGNOSIS — M25572 Pain in left ankle and joints of left foot: Secondary | ICD-10-CM

## 2022-10-23 ENCOUNTER — Encounter: Payer: Self-pay | Admitting: Surgical
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
## 2022-11-02 ENCOUNTER — Other Ambulatory Visit: Payer: Medicare Other

## 2022-11-08 ENCOUNTER — Ambulatory Visit: Payer: Medicare HMO | Admitting: Physical Therapy

## 2022-11-23 ENCOUNTER — Telehealth: Payer: Self-pay | Admitting: Surgical

## 2022-11-23 DIAGNOSIS — M25572 Pain in left ankle and joints of left foot: Secondary | ICD-10-CM

## 2022-11-23 NOTE — Telephone Encounter (Signed)
Patient says the pain is still there. Still hard for her to walk and put pressure on her foot. Would like MRI.

## 2022-11-23 NOTE — Telephone Encounter (Signed)
FYI Per last note, next steps was MRI, MRI was ordered. LVM to advise pt

## 2022-11-23 NOTE — Telephone Encounter (Signed)
Sounds good to me, thank you.

## 2022-11-27 ENCOUNTER — Encounter: Payer: Self-pay | Admitting: Physical Therapy

## 2022-11-27 ENCOUNTER — Ambulatory Visit (INDEPENDENT_AMBULATORY_CARE_PROVIDER_SITE_OTHER): Payer: No Typology Code available for payment source | Admitting: Physical Therapy

## 2022-11-27 ENCOUNTER — Other Ambulatory Visit: Payer: Self-pay

## 2022-11-27 DIAGNOSIS — R600 Localized edema: Secondary | ICD-10-CM

## 2022-11-27 DIAGNOSIS — M6281 Muscle weakness (generalized): Secondary | ICD-10-CM

## 2022-11-27 DIAGNOSIS — M25572 Pain in left ankle and joints of left foot: Secondary | ICD-10-CM | POA: Diagnosis not present

## 2022-11-27 NOTE — Therapy (Signed)
OUTPATIENT PHYSICAL THERAPY LOWER EXTREMITY EVALUATION/Discharge PHYSICAL THERAPY DISCHARGE SUMMARY  Visits from Start of Care: 1  Current functional level related to goals / functional outcomes: See below   Remaining deficits: See below   Education / Equipment: HEP  Plan:  Patient is being discharged as this was one visit authorized for HEP.      Patient Name: Brianna Hawkins MRN: [id number] DOB:December 23, 1960, 62 y.o., female Today's Date: 11/27/2022  END OF SESSION:  PT End of Session - 11/27/22 1454     Visit Number 1    Number of Visits 1    PT Start Time 1430    PT Stop Time 1500    PT Time Calculation (min) 30 min    Activity Tolerance Patient tolerated treatment well    Behavior During Therapy [facility] for tasks assessed/performed             Past Medical History:  Diagnosis Date   Coronary artery calcification seen on CT scan    cardiologist-- dr Melburn Popper; Coronary CTA 9/19: Ca score = 1 (80th percentile); calcified plaque distal LM; no obstructive CAD.   DM2 (diabetes mellitus, type 2) (HCC)    followed by pcp-- (09-29-2020 pt stated check blood sugar twice wkly in am,  fasting sugar--- unsure per pt)   DOE (dyspnea on exertion)    09-29-2020  per pt sob w/ any activity , especially stairs, stated uses rescue inhaler and recovers quickly   Endometrial polyp    Hysteroscopy D & C 10/22   GERD (gastroesophageal reflux disease)    H/O: substance abuse (HCC)    per pt last used crack cocaine in 1998   Heart murmur    last echo in epic 03-26-2019, ef 50-55%, trivial TR   History of syncope 2014   vasovagal   History of treatment for tuberculosis 1998   per pt treated in 1998;  pt had bronchoscopy w/ bx's RUL 10-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   HLD (hyperlipidemia)    Hypertension    Migraine    OA (osteoarthritis)    PMB (postmenopausal bleeding)    Postmenopausal bleeding 07/01/2020   Tuberculosis 01/02/1996   7 YEARS AGO   Urinary  urgency    Vertigo    Past Surgical History:  Procedure Laterality Date   COLONOSCOPY WITH ESOPHAGOGASTRODUODENOSCOPY (EGD)  09/05/2011   by dr Jarold Motto   ETHMOIDECTOMY Left 07/10/2013   Procedure: ETHMOIDECTOMY LEFT,MAXILLARY OSTIAL ENLARGEMENT WITH REMOVAL OF DISEASE;  Surgeon: Drema Halon, MD;  Location: [facility];  Service: ENT;  Laterality: Left;   HYSTEROSCOPY WITH D & C N/A 10/05/2020   Procedure: DILATATION AND CURETTAGE /HYSTEROSCOPY;  Surgeon: Hermina Staggers, MD;  Location: [facility];  Service: Gynecology;  Laterality: N/A;   MYOMECTOMY     NASAL POLYP SURGERY     SOFT TISSUE MASS EXCISION Right 05/17/2008   @[facility] ;  right axilla  (benign)   TUBAL LIGATION Bilateral    1980s   TURBINATE REDUCTION Left 07/10/2013   Procedure: LEFT TURBINATE REDUCTION;  Surgeon: Drema Halon, MD;  Location: [facility];  Service: ENT;  Laterality: Left;   Patient Active Problem List   Diagnosis Date Noted   Sore throat 06/23/2021   Vertigo, peripheral 05/09/2021   Right-sided thoracic back pain 04/11/2021   OSA (obstructive sleep apnea) 03/03/2020   Depression, major, single episode, moderate (HCC) 03/27/2019   Venous insufficiency of both lower extremities 08/11/2018  Hyperlipidemia associated with type 2 diabetes mellitus (HCC) 11/07/2017   Coronary artery calcification seen on CT scan    DM2 (diabetes mellitus, type 2) (HCC)    Chronic migraine without aura without status migrainosus, not intractable 11/11/2014   Morbid obesity (HCC) 11/11/2014   Chronic sinusitis 07/06/2013   Hypertension associated with diabetes (HCC) 02/08/2011    PCP: Jerre Simon, MD   REFERRING PROVIDER: Julieanne Cotton, PA-C   REFERRING DIAG: [phone] (ICD-10-CM) - Pain in left ankle and joints of left foot  THERAPY DIAG:  Pain in left ankle and joints of left foot  Muscle weakness (generalized)  Localized edema  Rationale for  Evaluation and Treatment: Rehabilitation  ONSET DATE: October 1st  SUBJECTIVE:   SUBJECTIVE STATEMENT: She sustained inversion injury at her workplace ([employer]).  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 The the pain has been shooting up her leg and even into her back. She does wear ankle brace  PERTINENT HISTORY: See above PMH PAIN:  NPRS scale: 8.5/10 upon arrival Pain location:Left lateral ankle Pain description: sharp pains at time Aggravating factors: walking, standing, moving her ankle Relieving factors: none   PRECAUTIONS: None  RED FLAGS: None   WEIGHT BEARING RESTRICTIONS: No  FALLS:  Has patient fallen in last 6 months? No  OCCUPATION: works at [employer]  PLOF: Independent  PATIENT GOALS: learn HEP  OBJECTIVE:  Note: Objective measures were completed at Evaluation unless otherwise noted.  DIAGNOSTIC FINDINGS:  AP, oblique, lateral views of the left ankle reviewed.  Minimal  osteophytic lipping of the medial aspect of the left ankle joint.  Ankle  mortise well-maintained.  No evidence of fracture or dislocation.  No  lateral process of the talus fracture.  No anterior process of calcaneus  fracture.  There is a plantar calcaneal spur and some mild degenerative  changes of multiple midfoot joints.  No acute abnormality.  No talar dome  lesion.  MRI scheduled 12/10/22  PATIENT SURVEYS:  No FOTO at this is one time visit  COGNITION: Overall cognitive status: Within functional limits for tasks assessed     SENSATION: WFL  EDEMA:  Mild-moderate edema noted left ankle  PALPATION: Tenderness and pain at left ATFL ligament  LOWER EXTREMITY ROM:  Active ROM deg in supine Right eval Left eval  Hip flexion    Hip extension    Hip abduction    Hip adduction    Hip internal rotation    Hip external rotation    Knee flexion     Knee extension    Ankle dorsiflexion 10 6  Ankle plantarflexion  35  Ankle inversion  15  Ankle eversion  20   (Blank rows = not tested)  LOWER EXTREMITY MMT:  MMT in supine Right eval Left eval  Hip flexion    Hip extension    Hip abduction    Hip adduction    Hip internal rotation    Hip external rotation    Knee flexion    Knee extension    Ankle dorsiflexion  5  Ankle plantarflexion  5  Ankle inversion  4  Ankle eversion  4   (Blank rows = not tested)   GAIT: WFL PLOF but has been limited due to pain since injury    TODAY'S TREATMENT:  Eval HEP creation and review with demonstration and trial set preformed,  see below for details -Vasopnuematic device X 10 min, medium compression, 34 deg to Lt ankle     PATIENT EDUCATION: Education details: HEP, PT plan of care Person educated: Patient Education method: Explanation, Demonstration, Verbal cues, and Handouts Education comprehension: verbalized understanding and needs further education   HOME EXERCISE PROGRAM: Access Code: [facility] URL: [email or web address] Date: 11/27/2022 Prepared by: Ivery Quale  Exercises - Seated Ankle Inversion with Resistance  - 2 x daily - 6 x weekly - 2 sets - 10-15 reps - Seated Ankle Eversion with Resistance  - 2 x daily - 6 x weekly - 2 sets - 10-15 reps - Gastroc Stretch on Wall  - 2 x daily - 6 x weekly - 3 reps - 30 hold - Seated Ankle Circles  - 2 x daily - 6 x weekly - 1 sets - 15 reps - Heel Raises with Counter Support  - 2 x daily - 6 x weekly - 1 sets - 10-15 reps - Tandem Stance  - 2 x daily - 6 x weekly - 3 reps - 30 sec hold  ASSESSMENT:  CLINICAL IMPRESSION: Patient referred to PT for Eval and treat left ankle sprain. 1 session has been authorized so we have set her up with HEP and will discharge today. She will have upcoming MRI 12/10/22 and will reassess after that if further PT is authorized.  OBJECTIVE IMPAIRMENTS: decreased activity  tolerance, difficulty walking, decreased balance, decreased endurance, decreased mobility, decreased ROM, decreased strength, impaired flexibility, impaired UE/LE use,  and pain.  ACTIVITY LIMITATIONS: bending, lifting, carry, locomotion, cleaning, community activity, and or occupation  PERSONAL FACTORS: see above PMH are also affecting patient's functional outcome.  REHAB POTENTIAL: Good  CLINICAL DECISION MAKING: Stable/uncomplicated  EVALUATION COMPLEXITY: Low    GOALS: Short term PT Goals Target date: 11/27/22 Pt will be show good understanding of HEP. Goal status: MET today No other goals indicated at this time as this is one time visit.  PLAN: PT FREQUENCY: 1 time only for HEP    PLANNED INTERVENTIONS (unless contraindicated): aquatic PT, Canalith repositioning, cryotherapy, Electrical stimulation, Iontophoresis with 4 mg/ml dexamethasome, Moist heat, traction, Ultrasound, gait training, Therapeutic exercise, balance training, neuromuscular re-education, patient/family education, prosthetic training, manual techniques, passive ROM, dry needling, taping, vasopnuematic device, vestibular, spinal manipulations, joint manipulations 97110-Therapeutic exercises, 97530- Therapeutic activity, [id number]- Neuromuscular re-education, 97535- Self Care, and [id number]- Manual therapy  PLAN FOR NEXT SESSION: DC today as referral was for one time visit    April Manson, PT,DPT 11/27/2022, 2:54 PM

## 2022-11-30 ENCOUNTER — Ambulatory Visit: Payer: Self-pay

## 2022-11-30 DIAGNOSIS — Z79899 Other long term (current) drug therapy: Secondary | ICD-10-CM

## 2022-11-30 DIAGNOSIS — E7800 Pure hypercholesterolemia, unspecified: Secondary | ICD-10-CM

## 2022-12-07 ENCOUNTER — Ambulatory Visit: Payer: Medicare HMO | Attending: Physician Assistant

## 2022-12-08 LAB — HEPATIC FUNCTION PANEL
ALT: 11 [IU]/L (ref 0–32)
AST: 18 [IU]/L (ref 0–40)
Albumin: 4 g/dL (ref 3.8–4.9)
Alkaline Phosphatase: 87 [IU]/L (ref 44–121)
Bilirubin Total: 0.4 mg/dL (ref 0.0–1.2)
Bilirubin, Direct: 0.17 mg/dL (ref 0.00–0.40)
Total Protein: 6 g/dL (ref 6.0–8.5)

## 2022-12-08 LAB — LIPID PANEL
Chol/HDL Ratio: 2.7 {ratio} (ref 0.0–4.4)
Cholesterol, Total: 127 mg/dL (ref 100–199)
HDL: 47 mg/dL (ref >39)
LDL Chol Calc (NIH): 69 mg/dL (ref 0–99)
Triglycerides: 49 mg/dL (ref 0–149)
VLDL Cholesterol Cal: 11 mg/dL (ref 5–40)

## 2022-12-10 ENCOUNTER — Ambulatory Visit
Admission: RE | Admit: 2022-12-10 | Discharge: 2022-12-10 | Disposition: A | Discharge status: Home / Self Care | Payer: No Typology Code available for payment source | Source: Ambulatory Visit | Attending: Surgical | Admitting: Surgical

## 2022-12-10 DIAGNOSIS — M25572 Pain in left ankle and joints of left foot: Secondary | ICD-10-CM

## 2022-12-17 ENCOUNTER — Telehealth: Payer: Self-pay | Admitting: Radiology

## 2022-12-17 ENCOUNTER — Other Ambulatory Visit: Payer: Self-pay | Admitting: Surgical

## 2022-12-17 MED ORDER — IBUPROFEN 800 MG PO TABS: 800.0000 mg | ORAL_TABLET | Freq: Three times a day (TID) | ORAL | 0 refills | Status: AC | PRN

## 2022-12-17 NOTE — Telephone Encounter (Signed)
Patient called requesting medication for back pain. She states that she is being treated for her ankle, however, she is having back pain due to her gait being off from the ankle. She has tried 2 ibuprofen with 2 tylenol per the therapists directions, however, this is not helping. She has ibuprofen 800mg  at home that she was prescribed after a knee surgery. She states that the doctor did not want her taking this long term originally as it was a blood thinner but this is the only thing that is helping her back. She would like for you to send something in for her for pain. She only has #3 ibuprofen left.  Please send to [facility] on [address].    CB for patient is [phone]

## 2022-12-17 NOTE — Telephone Encounter (Signed)
Refilled ibuprofen

## 2022-12-17 NOTE — Telephone Encounter (Signed)
I spoke with patient and advised. 

## 2022-12-21 ENCOUNTER — Encounter: Payer: Self-pay | Admitting: Student

## 2022-12-21 NOTE — Telephone Encounter (Signed)
 Care team updated and letter sent for eye exam notes.

## 2022-12-31 ENCOUNTER — Ambulatory Visit: Payer: Self-pay | Admitting: Surgical

## 2023-01-01 ENCOUNTER — Other Ambulatory Visit (INDEPENDENT_AMBULATORY_CARE_PROVIDER_SITE_OTHER): Payer: Self-pay

## 2023-01-01 ENCOUNTER — Ambulatory Visit: Payer: No Typology Code available for payment source | Admitting: Surgical

## 2023-01-01 DIAGNOSIS — M5442 Lumbago with sciatica, left side: Secondary | ICD-10-CM

## 2023-01-04 ENCOUNTER — Encounter: Payer: Self-pay | Admitting: Surgical

## 2023-01-04 ENCOUNTER — Encounter: Payer: Self-pay | Admitting: Cardiovascular Disease
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
## 2023-01-22 ENCOUNTER — Ambulatory Visit
Admission: RE | Admit: 2023-01-22 | Discharge: 2023-01-22 | Disposition: A | Discharge status: Home / Self Care | Payer: Medicare HMO | Source: Ambulatory Visit | Attending: Surgical | Admitting: Surgical

## 2023-01-22 DIAGNOSIS — M5442 Lumbago with sciatica, left side: Secondary | ICD-10-CM

## 2023-01-30 ENCOUNTER — Ambulatory Visit (INDEPENDENT_AMBULATORY_CARE_PROVIDER_SITE_OTHER): Payer: No Typology Code available for payment source | Admitting: Surgical

## 2023-01-30 ENCOUNTER — Encounter: Payer: Self-pay | Admitting: Surgical

## 2023-01-30 DIAGNOSIS — M5442 Lumbago with sciatica, left side: Secondary | ICD-10-CM

## 2023-01-30 DIAGNOSIS — S93492D Sprain of other ligament of left ankle, subsequent encounter: Secondary | ICD-10-CM

## 2023-01-30 NOTE — Progress Notes (Signed)
Office Visit Note   Patient: [name]           Date of Birth: 1960/02/04           MRN: [id number] Visit Date: 01/30/2023 Requested by: Jerre Simon, MD [address] [place],  [place] [address] PCP: Jerre Simon, MD  Subjective: Chief Complaint  Patient presents with   Left Ankle - Pain    MRI REVIEW   Lower Back - Pain    MRI REVIEW    HPI: Brianna Hawkins is a 63 y.o. female who presents to the office for MRI review.  Continues to complain mainly of left leg pain and low back pain.  The pain in her ankle is overall improved and only gives her difficulty if she walks without using shoes.  She has not returned back to work yet.  Has been doing home exercise program for her ankle over the last month in addition to using ankle brace.  Regarding her left leg radicular pain, she has less intense radicular pain and most of her pain seems to be in the thigh now rather than radiating all the way down to the ankle.               ROS: All systems reviewed are negative as they relate to the chief complaint within the history of present illness.  Patient denies fevers or chills.  Assessment & Plan: Visit Diagnoses:  1. Acute left-sided low back pain with left-sided sciatica   2. Sprain of anterior talofibular ligament of left ankle, subsequent encounter     Plan: Brianna Hawkins is a 63 y.o. female who presents to the office for review of lumbar spine MRI.  MRI was reviewed with the patient with no radiology report available yet.  MRI demonstrates small disc bulging primarily at L4-L5 and L3-L4 with some facet hypertrophy at these levels as well that contributes to moderate to severe foraminal narrowing at L4-L5 on the left.  We discussed options available to patient.  She would like to proceed with lumbar spine ESI with Dr. Alvester Morin.  We will set this up for her.  Follow-up as needed after ESI if no improvement.  As far as her ankle goes, her exam demonstrates markedly less  tenderness and swelling over the ATFL compared with last exam.  She feels more functional.  She has excellent strength and stability on exam today.  Recommended discontinuing ankle brace and continue with home exercise program.  Return to work on 02/04/2023 and let us know if there are any difficulties.  Follow-Up Instructions: No follow-ups on file.   Orders:  No orders of the defined types were placed in this encounter.  No orders of the defined types were placed in this encounter.     Procedures: No procedures performed   Clinical Data: No additional findings.  Objective: Vital Signs: LMP 12/11/2013   Physical Exam:  Constitutional: Patient appears well-developed HEENT:  Head: Normocephalic Eyes:EOM are normal Neck: Normal range of motion Cardiovascular: Normal rate Pulmonary/chest: Effort normal Neurologic: Patient is alert Skin: Skin is warm Psychiatric: Patient has normal mood and affect  Ortho Exam: Ortho exam demonstrates left ankle with palpable DP pulse.  Intact ankle dorsiflexion , plantarflexion, EHL, inversion, eversion without weakness.  No calf tenderness.  Negative Homans' sign.  She has excellent stability to anterior drawer of the ankle.  No significant tenderness over the CFL with minimal tenderness over the ATFL ligament.  This is markedly improved  compared with last exam.  No significant swelling noted.  No ecchymosis.  Specialty Comments:  No specialty comments available.  Imaging: No results found.   PMFS History: Patient Active Problem List   Diagnosis Date Noted   Sore throat 06/23/2021   Vertigo, peripheral 05/09/2021   Right-sided thoracic back pain 04/11/2021   OSA (obstructive sleep apnea) 03/03/2020   Depression, major, single episode, moderate (HCC) 03/27/2019   Venous insufficiency of both lower extremities 08/11/2018   Hyperlipidemia associated with type 2 diabetes mellitus (HCC) 11/07/2017   Coronary artery calcification seen on CT  scan    DM2 (diabetes mellitus, type 2) (HCC)    Chronic migraine without aura without status migrainosus, not intractable 11/11/2014   Morbid obesity (HCC) 11/11/2014   Chronic sinusitis 07/06/2013   Hypertension associated with diabetes (HCC) 02/08/2011   Past Medical History:  Diagnosis Date   Coronary artery calcification seen on CT scan    cardiologist-- dr Melburn Popper; Coronary CTA 9/19: Ca score = 1 (80th percentile); calcified plaque distal LM; no obstructive CAD.   DM2 (diabetes mellitus, type 2) (HCC)    followed by pcp-- (09-29-2020 pt stated check blood sugar twice wkly in am,  fasting sugar--- unsure per pt)   DOE (dyspnea on exertion)    09-29-2020  per pt sob w/ any activity , especially stairs, stated uses rescue inhaler and recovers quickly   Endometrial polyp    Hysteroscopy D & C 10/22   GERD (gastroesophageal reflux disease)    H/O: substance abuse (HCC)    per pt last used crack cocaine in 1998   Heart murmur    last echo in epic 03-26-2019, ef 50-55%, trivial TR   History of syncope 2014   vasovagal   History of treatment for tuberculosis 1998   per pt treated in 1998;  pt had bronchoscopy w/ bx's RUL 10-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   HLD (hyperlipidemia)    Hypertension    Migraine    OA (osteoarthritis)    PMB (postmenopausal bleeding)    Postmenopausal bleeding 07/01/2020   Tuberculosis 01/02/1996   7 YEARS AGO   Urinary urgency    Vertigo     Family History  Problem Relation Age of Onset   Alcohol abuse Mother    Diabetes Mother    Heart disease Mother    Cancer Mother    Esophageal cancer Father    Rectal cancer Father    Throat cancer Father        was a smoker   Colon cancer Father    Alcohol abuse Father    Cancer Father 60       colon cancer   HIV Sister    Heart disease Sister 91       died age 45 "Heart was weak"   Stroke Sister    Stomach cancer Maternal Aunt    Diabetes Maternal Grandmother    Diabetes Paternal  Grandmother    Dementia Paternal Grandmother    Hypertension Paternal Grandmother    Breast cancer Paternal Grandmother    Skin cancer Paternal Grandmother    Sudden Cardiac Death Neg Hx     Past Surgical History:  Procedure Laterality Date   COLONOSCOPY WITH ESOPHAGOGASTRODUODENOSCOPY (EGD)  09/05/2011   by dr Jarold Motto   ETHMOIDECTOMY Left 07/10/2013   Procedure: ETHMOIDECTOMY LEFT,MAXILLARY OSTIAL ENLARGEMENT WITH REMOVAL OF DISEASE;  Surgeon: Drema Halon, MD;  Location: [facility];  Service: ENT;  Laterality: Left;  HYSTEROSCOPY WITH D & C N/A 10/05/2020   Procedure: DILATATION AND CURETTAGE /HYSTEROSCOPY;  Surgeon: Hermina Staggers, MD;  Location: [facility];  Service: Gynecology;  Laterality: N/A;   MYOMECTOMY     NASAL POLYP SURGERY     SOFT TISSUE MASS EXCISION Right 05/17/2008   @[facility] ;  right axilla  (benign)   TUBAL LIGATION Bilateral    1980s   TURBINATE REDUCTION Left 07/10/2013   Procedure: LEFT TURBINATE REDUCTION;  Surgeon: Drema Halon, MD;  Location: [facility];  Service: ENT;  Laterality: Left;   Social History   Occupational History   Occupation: unemployed    Employer: [employer]    Comment: house keeping   Occupation: [employer]  Tobacco Use   Smoking status: Former    Current packs/day: 0.00    Types: Cigarettes    Start date: 1985    Quit date: 2000    Years since quitting: 25.0   Smokeless tobacco: Never  Vaping Use   Vaping status: Never Used  Substance and Sexual Activity   Alcohol use: Not Currently    Comment: occasional   Drug use: Not Currently    Comment: per pt Last crack cocaine 1998   Sexual activity: Not on file

## 2023-02-22 ENCOUNTER — Encounter (HOSPITAL_COMMUNITY): Payer: Self-pay | Admitting: *Deleted

## 2023-02-22 ENCOUNTER — Other Ambulatory Visit: Payer: Self-pay

## 2023-02-22 ENCOUNTER — Emergency Department (HOSPITAL_COMMUNITY): Payer: Medicare HMO

## 2023-02-22 ENCOUNTER — Emergency Department (HOSPITAL_COMMUNITY)
Admission: EM | Admit: 2023-02-22 | Discharge: 2023-02-23 | Disposition: A | Discharge status: Home / Self Care | Payer: Medicare HMO | Attending: Emergency Medicine | Admitting: Emergency Medicine

## 2023-02-22 DIAGNOSIS — I2510 Atherosclerotic heart disease of native coronary artery without angina pectoris: Secondary | ICD-10-CM | POA: Diagnosis not present

## 2023-02-22 DIAGNOSIS — Z79899 Other long term (current) drug therapy: Secondary | ICD-10-CM | POA: Diagnosis not present

## 2023-02-22 DIAGNOSIS — E119 Type 2 diabetes mellitus without complications: Secondary | ICD-10-CM | POA: Insufficient documentation

## 2023-02-22 DIAGNOSIS — Z7982 Long term (current) use of aspirin: Secondary | ICD-10-CM | POA: Insufficient documentation

## 2023-02-22 DIAGNOSIS — R079 Chest pain, unspecified: Secondary | ICD-10-CM | POA: Diagnosis present

## 2023-02-22 DIAGNOSIS — I10 Essential (primary) hypertension: Secondary | ICD-10-CM | POA: Diagnosis not present

## 2023-02-22 DIAGNOSIS — Z7984 Long term (current) use of oral hypoglycemic drugs: Secondary | ICD-10-CM | POA: Insufficient documentation

## 2023-02-22 LAB — BASIC METABOLIC PANEL
Anion gap: 9 (ref 5–15)
BUN: 17 mg/dL (ref 6–20)
CO2: 27 mmol/L (ref 22–32)
Calcium: 9.8 mg/dL (ref 8.9–10.3)
Chloride: 107 mmol/L (ref 98–111)
Creatinine, Ser: 0.71 mg/dL (ref 0.44–1.00)
GFR, Estimated: >60 mL/min (ref >60)
Glucose, Bld: 92 mg/dL (ref 70–99)
Potassium: 3.6 mmol/L (ref 3.5–5.1)
Sodium: 143 mmol/L (ref 135–145)

## 2023-02-22 LAB — CBC
HCT: 36.1 % (ref 36.0–46.0)
Hemoglobin: 12.1 g/dL (ref 12.0–15.0)
MCH: 29.3 pg (ref 26.0–34.0)
MCHC: 33.5 g/dL (ref 30.0–36.0)
MCV: 87.4 fL (ref 80.0–100.0)
Platelets: 285 10*3/uL (ref 150–400)
RBC: 4.13 MIL/uL (ref 3.87–5.11)
RDW: 13.4 % (ref 11.5–15.5)
WBC: 6 10*3/uL (ref 4.0–10.5)
nRBC: 0 % (ref 0.0–0.2)

## 2023-02-22 LAB — TROPONIN I (HIGH SENSITIVITY): Troponin I (High Sensitivity): 4 ng/L (ref <18)

## 2023-02-22 NOTE — ED Triage Notes (Signed)
 Chest pain for months with some dizziness tonight she decided to have it checked  early this am  tylenol was taken and motrin  and the pain went away and chest pain returned 1800

## 2023-02-22 NOTE — ED Provider Triage Note (Signed)
 Emergency Medicine Provider Triage Evaluation Note  Brianna Hawkins , a 64 y.o. female  was evaluated in triage.  Pt complains of presented for chest pain has been present for the past few months.  Patient takes ibuprofen and Tylenol which seems to help.  Patient denies any nausea vomiting, shortness of breath, radiation of this chest pain that stays in the left side.  Patient does do heavy lifting at work but denies any pain to her chest when she pressed on her chest.  Review of Systems  Positive:  Negative:   Physical Exam  BP (!) 133/91   Pulse 74   Temp 98.4 F (36.9 C)   Resp 18   Ht 5\' 6"  (1.676 m)   Wt 105.7 kg   LMP 12/11/2013   SpO2 99%   BMI 37.61 kg/m  Gen:   Awake, no distress   Resp:  Normal effort  MSK:   Moves extremities without difficulty  Other:    Medical Decision Making  Medically screening exam initiated at 9:38 PM.  Appropriate orders placed.  Brianna Hawkins was informed that the remainder of the evaluation will be completed by another provider, this initial triage assessment does not replace that evaluation, and the importance of remaining in the ED until their evaluation is complete.  Workup initiated, patient stable.   Netta Corrigan, Cordelia Poche 02/22/23 [id number]

## 2023-02-23 LAB — TROPONIN I (HIGH SENSITIVITY): Troponin I (High Sensitivity): 4 ng/L (ref <18)

## 2023-02-23 NOTE — ED Provider Notes (Signed)
 [place] EMERGENCY DEPARTMENT AT [facility] Provider Note   CSN: [id number] Arrival date & time: 02/22/23  1904     History  Chief Complaint  Patient presents with   Chest Pain    Brianna Hawkins is a 63 y.o. female with history of nonobstructive CAD, hypertension, type 2 diabetes who presents with concern for 6 months of chronic and reportedly constant pinpoint left-sided chest pressure that is worse with movement or lying flat in bed at night without associated shortness of breath palpitations, presyncopal symptoms, lower extremity edema, or syncope.  Pain does not radiate.  She states that she is the primary caregiver for her husband who has a terminal illness as well as her brother who has had multiple strokes and is paralyzed.  She states that she has been putting off getting herself evaluated due to her responsibilities with them.  She does have concerns that maybe it is a muscle strain given the lifting she does caring for her loved ones.  She is not on any anticoagulation.  She has been seen by Dr. Lourena Simmonds with heart care in the past endorses compliance with her antihypertensive medications as well as her oral diabetic medications.  HPI     Home Medications Prior to Admission medications   Medication Sig Start Date End Date Taking? Authorizing Provider  ibuprofen (ADVIL) 800 MG tablet Take 1 tablet (800 mg total) by mouth every 8 (eight) hours as needed. 12/17/22   Magnant, Joycie Peek, PA-C  AGAMATRIX ULTRA-THIN LANCETS MISC Check sugars twice daily before meals 08/07/17   Julieanne Manson, MD  amLODipine (NORVASC) 10 MG tablet Take 1 tablet by mouth once daily 04/05/22   Jerre Simon, MD  aspirin EC 81 MG tablet Take 1 tablet (81 mg total) by mouth daily. 05/15/16   Julieanne Manson, MD  Blood Glucose Monitoring Suppl [facility] PRESTO) w/Device KIT Check sugars twice daily before meals 08/07/17   Julieanne Manson, MD  Cholecalciferol (VITAMIN D3) 50 MCG (2000  UT) TABS Take 5,000 Units by mouth daily.     [provider]  esomeprazole (NEXIUM) 40 MG capsule Take 1 capsule by mouth once daily 05/31/22   Bess Kinds, MD  ezetimibe (ZETIA) 10 MG tablet Take 1 tablet (10 mg total) by mouth daily. 01/26/22   Tereso Newcomer T, PA-C  fluticasone (FLONASE) 50 MCG/ACT nasal spray Place 2 sprays into both nostrils daily. 02/18/20   Meccariello, Solmon Ice, MD  glucose blood (AGAMATRIX PRESTO TEST) test strip Check blood glucose twice daily before meals 08/07/17   Julieanne Manson, MD  hydrochlorothiazide (HYDRODIURIL) 25 MG tablet Take 1 tablet by mouth once daily 05/14/22   Jerre Simon, MD  metFORMIN (GLUCOPHAGE-XR) 500 MG 24 hr tablet TAKE 1 TABLET BY MOUTH TWICE DAILY WITH A MEAL 07/03/21   Jerre Simon, MD  metFORMIN (GLUCOPHAGE-XR) 500 MG 24 hr tablet TAKE 1 TABLET BY MOUTH TWICE DAILY WITH A MEAL 06/06/22   Jerre Simon, MD  Multiple Vitamins-Minerals (HAIR/SKIN/NAILS) TABS Take 1 tablet by mouth daily.    [provider]  nitroGLYCERIN (NITROSTAT) 0.4 MG SL tablet 1 tab under tongue for chest pressure, may repeat every 5 minutes if not relieved for total of 3 doses. 08/07/17   Julieanne Manson, MD  ondansetron (ZOFRAN) 4 MG tablet Take 1 tablet (4 mg total) by mouth every 8 (eight) hours as needed for nausea or vomiting. 06/27/21   Alfredo Martinez, MD  rizatriptan (MAXALT) 10 MG tablet Take 10 mg by mouth as  needed for migraine. May repeat in 2 hours if needed    [provider]  rosuvastatin (CRESTOR) 20 MG tablet Take 1 tablet (20 mg total) by mouth daily. 08/29/22 11/27/22  Tereso Newcomer T, PA-C  terbinafine (LAMISIL) 250 MG tablet Take 1 tablet (250 mg total) by mouth daily. 04/20/21   Felecia Shelling, DPM      Allergies    Patient has no known allergies.    Review of Systems   Review of Systems  Constitutional: Negative.   HENT: Negative.    Respiratory: Negative.  Negative for shortness of breath.   Cardiovascular:  Positive  for chest pain. Negative for palpitations and leg swelling.  Gastrointestinal: Negative.     Physical Exam Updated Vital Signs BP (!) 133/91   Pulse 74   Temp 98.4 F (36.9 C)   Resp 18   Ht 5\' 6"  (1.676 m)   Wt 105.7 kg   LMP 12/11/2013   SpO2 99%   BMI 37.61 kg/m  Physical Exam Vitals and nursing note reviewed.  HENT:     Head: Normocephalic and atraumatic.     Mouth/Throat:     Mouth: Mucous membranes are moist.     Pharynx: No oropharyngeal exudate or posterior oropharyngeal erythema.  Eyes:     General:        Right eye: No discharge.        Left eye: No discharge.     Conjunctiva/sclera: Conjunctivae normal.  Cardiovascular:     Rate and Rhythm: Normal rate and regular rhythm.     Pulses: Normal pulses.     Heart sounds: Normal heart sounds. No murmur heard. Pulmonary:     Effort: Pulmonary effort is normal. No respiratory distress.     Breath sounds: Normal breath sounds. No wheezing or rales.  Chest:     Chest wall: Tenderness present. No mass or edema.    Abdominal:     General: Bowel sounds are normal. There is no distension.     Palpations: Abdomen is soft.     Tenderness: There is no abdominal tenderness.  Musculoskeletal:        General: No deformity.     Cervical back: Neck supple.     Right lower leg: No tenderness. No edema.     Left lower leg: No tenderness. No edema.  Skin:    General: Skin is warm and dry.  Neurological:     Mental Status: She is alert. Mental status is at baseline.  Psychiatric:        Mood and Affect: Mood normal.     ED Results / Procedures / Treatments   Labs (all labs ordered are listed, but only abnormal results are displayed) Labs Reviewed  BASIC METABOLIC PANEL  CBC  TROPONIN I (HIGH SENSITIVITY)  TROPONIN I (HIGH SENSITIVITY)    EKG None  Radiology DG Chest 2 View Result Date: 02/22/2023 CLINICAL DATA:  Chest pain EXAM: CHEST - 2 VIEW COMPARISON:  Chest x-ray 06/23/2021, 11/02/2019. CT angiogram  chest 11/15/2014. FINDINGS: Focal bandlike opacity in the right suprahilar region appears unchanged. Ill-defined nodular densities in the right upper lobe appear unchanged. There is no new focal lung infiltrate, pleural effusion or pneumothorax. The cardiomediastinal silhouette is within normal limits. IMPRESSION: 1. No active cardiopulmonary disease. 2. Stable right upper lobe opacities favored as scarring. Electronically Signed   By: Darliss Cheney M.D.   On: 02/22/2023 22:10    Procedures Procedures    Medications Ordered in  ED Medications - No data to display  ED Course/ Medical Decision Making/ A&P             HEART Score: 3                    Medical Decision Making 63 year old female with left-sided chest pressure x 6 months.  Mildly hypertensive on intake and vitals otherwise normal.  Cardiopulmonary abdominal exams are benign.  No lower extremity edema.  Patient overall very well-appearing.   1 3 DDx includes but is not limited to ACS, PE, pleural effusion, pneumonia, pneumothorax, dysrhythmia, musculoskeletal.  Amount and/or Complexity of Data Reviewed Labs:     Details: CBC without leukocytosis, BMP unremarkable, troponin is normal at 4 x 2.  Radiology: independent interpretation performed.    Details:  Chest x-ray negative for acute cardiopulmonary disease with stable right upper lobe opacities with scarring.   ECG/medicine tests: independent interpretation performed.    Details:   EKG with normal sinus rhythm without ischemic changes.   Clinical patient was consistent with muscular strain of the left chest wall, however given risk factors will refer back to cardiology for further evaluation.  Heart score of 3, patient appropriate for outpatient follow-up.  Clinical concern for emergent underlying condition that would warrant further ED workup or patient management is exceedingly low.  Brianna Hawkins  voiced understanding of her medical evaluation and treatment plan. Each of  their questions answered to their expressed satisfaction.  Return precautions were given.  Patient is well-appearing, stable, and was discharged in good condition.   This chart was dictated using voice recognition software, Dragon. Despite the best efforts of this provider to proofread and correct errors, errors may still occur which can change documentation meaning.         Final Clinical Impression(s) / ED Diagnoses Final diagnoses:  Chest pain, unspecified type    Rx / DC Orders ED Discharge Orders     None         Sherrilee Gilles 02/23/23 0981    Gilda Crease, MD 02/23/23 [phone]

## 2023-02-23 NOTE — Discharge Instructions (Addendum)
 You were seen in the ER today for your chest pain. Your workup and physical exam were very reassuring. Please follow up with your cardiologist listed below, and return to the ER with any new severe symptoms.

## 2023-02-25 ENCOUNTER — Encounter: Payer: Self-pay | Admitting: Cardiovascular Disease

## 2023-03-07 ENCOUNTER — Encounter: Payer: Self-pay | Admitting: Cardiovascular Disease

## 2023-03-07 ENCOUNTER — Encounter: Payer: Self-pay | Admitting: Family Medicine

## 2023-03-07 ENCOUNTER — Ambulatory Visit: Payer: Self-pay | Admitting: Family Medicine

## 2023-03-07 VITALS — BP 120/76 | HR 71 | Temp 97.7°F | Ht 66.0 in | Wt 241.2 lb

## 2023-03-07 DIAGNOSIS — E1159 Type 2 diabetes mellitus with other circulatory complications: Secondary | ICD-10-CM

## 2023-03-07 DIAGNOSIS — Z7689 Persons encountering health services in other specified circumstances: Secondary | ICD-10-CM | POA: Insufficient documentation

## 2023-03-07 DIAGNOSIS — I119 Hypertensive heart disease without heart failure: Secondary | ICD-10-CM | POA: Diagnosis not present

## 2023-03-07 DIAGNOSIS — I2510 Atherosclerotic heart disease of native coronary artery without angina pectoris: Secondary | ICD-10-CM

## 2023-03-07 DIAGNOSIS — I152 Hypertension secondary to endocrine disorders: Secondary | ICD-10-CM | POA: Diagnosis not present

## 2023-03-07 DIAGNOSIS — E119 Type 2 diabetes mellitus without complications: Secondary | ICD-10-CM

## 2023-03-07 NOTE — Patient Instructions (Signed)

## 2023-03-07 NOTE — Progress Notes (Signed)
  Cardiology Office Note:  .   Date:  05/05/2023  ID:  Brianna Hawkins, DOB 02/11/1960, MRN [id number] PCP: Brianna Spry, NP  [facility] Providers Cardiologist:  Brianna Alert, MD Cardiology APP:  Brianna Joe, PA-C    History of Present Illness: .   Brianna Hawkins is a 62 y.o. female   She has been seen by Brianna Hawkins , PA for years and was assigned to me.   I have not seen her previously   Hx of HTN, HLD, hx of chest pain  Has a family hx of CAD  BP looks good  Lipids look great   Went to the ER several weeks ago with chronic CP  Worse with movement , not pleuretic , not exertional   Work up  in the ER was unremarkable  Troponin was normal. CP had been present for 6 months .   Left sided chest pain  Takes care of her husband who has severe COPD , Also takes care of her brother who has had 5 strokes   She had a coronary CT angiogram in 2019 which revealed a coronary calcium  score of 1 and no obstructive coronary artery disease.  Chronic calcium  score now is 45 which is 51 percentile for age and sex matched controls.  Does not get exercise  Makes biscuits for [employer]  Uses lots muscle in her  arms, / shoulder   Most of her pains occur when she is very stressed out Will try propranolol  10 mg QID for these episodes of CP    ROS:   Studies Reviewed: .         Risk Assessment/Calculations:         Physical Exam:   VS:  BP 132/80   Pulse 67   Ht 5\' 6"  (1.676 m)   Wt 240 lb (108.9 kg)   LMP 12/11/2013   SpO2 97%   BMI 38.74 kg/m    Wt Readings from Last 3 Encounters:  03/08/23 240 lb (108.9 kg)  03/07/23 241 lb 3.2 oz (109.4 kg)  02/22/23 233 lb 0.4 oz (105.7 kg)    GEN: Well nourished, well developed in no acute distress NECK: No JVD; No carotid bruits CARDIAC: RRR, no murmurs, rubs, gallops RESPIRATORY:  Clear to auscultation without rales, wheezing or rhonchi  ABDOMEN: Soft, non-tender, non-distended EXTREMITIES:  No edema; No  deformity   ASSESSMENT AND PLAN: .   Chest pain: Brianna Hawkins seen for noncardiac chest pain.  She is under lots of stress.  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  She does a lot of lifting both at work and at home.  All of her episodes of chest pain occur with stress.  Will try her on propranolol  4 times a day as needed for these episodes of chest discomfort.       Dispo: 6 months with Brianna Single, Pa   Signed, Brianna Alert, MD

## 2023-03-07 NOTE — Progress Notes (Signed)
 I,[name], CMA,acting as a Neurosurgeon for [employer], NP.,have documented all relevant documentation on the behalf of Ellender Hose, NP,as directed by  Ellender Hose, NP while in the presence of Ellender Hose, NP.  Subjective:  Patient ID: Brianna Hawkins , female    DOB: 1960/01/23 , 63 y.o.   MRN: [id number]  Chief Complaint  Patient presents with   Establish Care   Hospitalization Follow-up    HPI  Patient is a 63 year old who presents today to establish care. She states that she was at the ER d/t chest pains on 02/22/2023 and she was diagnosed with coscondritis. Patient states that she pulls and lifts heavy objects at work.She is scheduled to see a cardiologist for further evaluation. Discussed with patient about her diagnosis of severe depression, and the treatment options available such as counseling and medication.Patient declined treatment stating that there was no point if the issue that was making her depressed was not resolved., but she takes solace in gospel songs and her hope in God, patient says.      Past Medical History:  Diagnosis Date   Coronary artery calcification seen on CT scan    cardiologist-- dr Melburn Popper; Coronary CTA 9/19: Ca score = 1 (80th percentile); calcified plaque distal LM; no obstructive CAD.   DM2 (diabetes mellitus, type 2) (HCC)    followed by pcp-- (09-29-2020 pt stated check blood sugar twice wkly in am,  fasting sugar--- unsure per pt)   DOE (dyspnea on exertion)    09-29-2020  per pt sob w/ any activity , especially stairs, stated uses rescue inhaler and recovers quickly   Endometrial polyp    Hysteroscopy D & C 10/22   GERD (gastroesophageal reflux disease)    H/O: substance abuse (HCC)    per pt last used crack cocaine in 1998   Heart murmur    last echo in epic 03-26-2019, ef 50-55%, trivial TR   History of syncope 2014   vasovagal   History of treatment for tuberculosis 1998   per pt treated in 1998;  pt had bronchoscopy w/ bx's RUL  10-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   HLD (hyperlipidemia)    Hypertension    Migraine    OA (osteoarthritis)    PMB (postmenopausal bleeding)    Postmenopausal bleeding 07/01/2020   Tuberculosis 01/02/1996   7 YEARS AGO   Urinary urgency    Vertigo      Family History  Problem Relation Age of Onset   Alcohol abuse Mother    Diabetes Mother    Heart disease Mother    Cancer Mother    Esophageal cancer Father    Rectal cancer Father    Throat cancer Father        was a smoker   Colon cancer Father    Alcohol abuse Father    Cancer Father 17       colon cancer   HIV Sister    Heart disease Sister 43       died age 1 "Heart was weak"   Stroke Sister    Stomach cancer Maternal Aunt    Diabetes Maternal Grandmother    Diabetes Paternal Grandmother    Dementia Paternal Grandmother    Hypertension Paternal Grandmother    Breast cancer Paternal Grandmother    Skin cancer Paternal Grandmother    Sudden Cardiac Death Neg Hx      Current Outpatient Medications:    AGAMATRIX ULTRA-THIN LANCETS MISC,  Check sugars twice daily before meals, Disp: 100 each, Rfl: 11   albuterol (VENTOLIN HFA) 108 (90 Base) MCG/ACT inhaler, Inhale 2 puffs into the lungs every 6 (six) hours as needed for wheezing or shortness of breath., Disp: , Rfl:    amLODipine (NORVASC) 10 MG tablet, Take 1 tablet by mouth once daily, Disp: 90 tablet, Rfl: 0   aspirin EC 81 MG tablet, Take 1 tablet (81 mg total) by mouth daily., Disp: , Rfl:    Blood Glucose Monitoring Suppl (AGAMATRIX PRESTO) w/Device KIT, Check sugars twice daily before meals, Disp: 1 kit, Rfl: 0   Cholecalciferol (VITAMIN D3) 50 MCG (2000 UT) TABS, Take 5,000 Units by mouth daily. , Disp: , Rfl:    esomeprazole (NEXIUM) 40 MG capsule, Take 1 capsule by mouth once daily, Disp: 30 capsule, Rfl: 0   fluticasone (FLONASE) 50 MCG/ACT nasal spray, Place 2 sprays into both nostrils daily., Disp: 16 g, Rfl: 3   glucose blood (AGAMATRIX  PRESTO TEST) test strip, Check blood glucose twice daily before meals, Disp: 100 each, Rfl: 11   hydrochlorothiazide (HYDRODIURIL) 25 MG tablet, Take 1 tablet by mouth once daily, Disp: 90 tablet, Rfl: 0   ibuprofen (ADVIL) 800 MG tablet, Take 1 tablet (800 mg total) by mouth every 8 (eight) hours as needed., Disp: 30 tablet, Rfl: 0   metFORMIN (GLUCOPHAGE-XR) 500 MG 24 hr tablet, TAKE 1 TABLET BY MOUTH TWICE DAILY WITH A MEAL, Disp: 60 tablet, Rfl: 0   Multiple Vitamins-Minerals (HAIR/SKIN/NAILS) TABS, Take 1 tablet by mouth daily., Disp: , Rfl:    nitroGLYCERIN (NITROSTAT) 0.4 MG SL tablet, 1 tab under tongue for chest pressure, may repeat every 5 minutes if not relieved for total of 3 doses., Disp: 24 tablet, Rfl: 3   ondansetron (ZOFRAN) 4 MG tablet, Take 1 tablet (4 mg total) by mouth every 8 (eight) hours as needed for nausea or vomiting., Disp: 6 tablet, Rfl: 0   rizatriptan (MAXALT) 10 MG tablet, Take 10 mg by mouth as needed for migraine. May repeat in 2 hours if needed, Disp: , Rfl:    ezetimibe (ZETIA) 10 MG tablet, Take 1 tablet (10 mg total) by mouth daily., Disp: 90 tablet, Rfl: 3   metFORMIN (GLUCOPHAGE-XR) 500 MG 24 hr tablet, TAKE 1 TABLET BY MOUTH TWICE DAILY WITH A MEAL, Disp: 60 tablet, Rfl: 0   propranolol (INDERAL) 10 MG tablet, Take 1 tablet (10 mg total) by mouth 3 (three) times daily., Disp: 120 tablet, Rfl: 4   rosuvastatin (CRESTOR) 20 MG tablet, Take 1 tablet (20 mg total) by mouth daily., Disp: 90 tablet, Rfl: 3   No Known Allergies   Review of Systems  Constitutional: Negative.   HENT: Negative.    Eyes: Negative.   Respiratory: Negative.    Cardiovascular:  Negative for chest pain, palpitations and leg swelling.  Endocrine: Negative for polydipsia, polyphagia and polyuria.  Musculoskeletal: Negative.   Skin: Negative.   Psychiatric/Behavioral:  Positive for dysphoric mood.      Today's Vitals   03/07/23 1023  BP: 120/76  Pulse: 71  Temp: 97.7 F (36.5 C)   TempSrc: Oral  Weight: 241 lb 3.2 oz (109.4 kg)  Height: 5\' 6"  (1.676 m)  PainSc: 7   PainLoc: Back   Body mass index is 38.93 kg/m.  Wt Readings from Last 3 Encounters:  03/08/23 240 lb (108.9 kg)  03/07/23 241 lb 3.2 oz (109.4 kg)  02/22/23 233 lb 0.4 oz (105.7 kg)  The 10-year ASCVD risk score (Arnett DK, et al., 2019) is: 13.3%   Values used to calculate the score:     Age: 32 years     Sex: Female     Is Non-Hispanic African American: Yes     Diabetic: Yes     Tobacco smoker: No     Systolic Blood Pressure: 132 mmHg     Is BP treated: Yes     HDL Cholesterol: 50 mg/dL     Total Cholesterol: 143 mg/dL  Objective:  Physical Exam HENT:     Head: Normocephalic.     Nose: Nose normal.  Cardiovascular:     Rate and Rhythm: Normal rate.  Pulmonary:     Effort: Pulmonary effort is normal.     Breath sounds: Normal breath sounds.  Skin:    General: Skin is warm and dry.  Neurological:     Mental Status: She is alert and oriented to person, place, and time.  Psychiatric:        Mood and Affect: Mood normal.         Assessment And Plan:  Establishing care with new doctor, encounter for  Hypertension associated with diabetes (HCC) -     CBC -     CMP14+EGFR -     Microalbumin / creatinine urine ratio  Coronary artery calcification seen on CT scan -     Lipid panel  Type 2 diabetes mellitus without complication, without long-term current use of insulin (HCC) -     Hemoglobin A1c    Return in about 14 weeks (around 06/13/2023) for diabetes and high blood pressure.  Patient was given opportunity to ask questions. Patient verbalized understanding of the plan and was able to repeat key elements of the plan. All questions were answered to their satisfaction.    I, Ellender Hose, NP, have reviewed all documentation for this visit. The documentation on 03/11/2023 for the exam, diagnosis, procedures, and orders are all accurate and complete.   IF YOU HAVE BEEN  REFERRED TO A SPECIALIST, IT MAY TAKE 1-2 WEEKS TO SCHEDULE/PROCESS THE REFERRAL. IF YOU HAVE NOT HEARD FROM US/SPECIALIST IN TWO WEEKS, PLEASE GIVE [place] A CALL AT [phone].

## 2023-03-08 ENCOUNTER — Ambulatory Visit: Payer: Medicare HMO | Attending: Cardiovascular Disease | Admitting: Cardiovascular Disease

## 2023-03-08 ENCOUNTER — Encounter: Payer: Self-pay | Admitting: Cardiovascular Disease

## 2023-03-08 VITALS — BP 132/80 | HR 67 | Ht 66.0 in | Wt 240.0 lb

## 2023-03-08 DIAGNOSIS — I152 Hypertension secondary to endocrine disorders: Secondary | ICD-10-CM | POA: Diagnosis not present

## 2023-03-08 DIAGNOSIS — E1159 Type 2 diabetes mellitus with other circulatory complications: Secondary | ICD-10-CM | POA: Diagnosis not present

## 2023-03-08 DIAGNOSIS — I2510 Atherosclerotic heart disease of native coronary artery without angina pectoris: Secondary | ICD-10-CM | POA: Diagnosis not present

## 2023-03-08 LAB — CMP14+EGFR
ALT: 14 IU/L (ref 0–32)
AST: 18 IU/L (ref 0–40)
Albumin: 4.6 g/dL (ref 3.8–4.9)
Alkaline Phosphatase: 125 IU/L — ABNORMAL HIGH (ref 44–121)
BUN/Creatinine Ratio: 17 (ref 12–28)
BUN: 11 mg/dL (ref 8–27)
Bilirubin Total: 0.5 mg/dL (ref 0.0–1.2)
CO2: 23 mmol/L (ref 20–29)
Calcium: 10.1 mg/dL (ref 8.7–10.3)
Chloride: 103 mmol/L (ref 96–106)
Creatinine, Ser: 0.65 mg/dL (ref 0.57–1.00)
Globulin, Total: 2.8 g/dL (ref 1.5–4.5)
Glucose: 97 mg/dL (ref 70–99)
Potassium: 4.6 mmol/L (ref 3.5–5.2)
Sodium: 142 mmol/L (ref 134–144)
Total Protein: 7.4 g/dL (ref 6.0–8.5)
eGFR: 101 mL/min/{1.73_m2} (ref >59)

## 2023-03-08 LAB — LIPID PANEL
Chol/HDL Ratio: 2.9 ratio (ref 0.0–4.4)
Cholesterol, Total: 143 mg/dL (ref 100–199)
HDL: 50 mg/dL (ref >39)
LDL Chol Calc (NIH): 79 mg/dL (ref 0–99)
Triglycerides: 68 mg/dL (ref 0–149)
VLDL Cholesterol Cal: 14 mg/dL (ref 5–40)

## 2023-03-08 LAB — CBC
Hematocrit: 36.1 % (ref 34.0–46.6)
Hemoglobin: 11.6 g/dL (ref 11.1–15.9)
MCH: 28.6 pg (ref 26.6–33.0)
MCHC: 32.1 g/dL (ref 31.5–35.7)
MCV: 89 fL (ref 79–97)
Platelets: 277 10*3/uL (ref 150–450)
RBC: 4.06 x10E6/uL (ref 3.77–5.28)
RDW: 13.6 % (ref 11.7–15.4)
WBC: 5.2 10*3/uL (ref 3.4–10.8)

## 2023-03-08 LAB — HEMOGLOBIN A1C
Est. average glucose Bld gHb Est-mCnc: 137 mg/dL
Hgb A1c MFr Bld: 6.4 % — ABNORMAL HIGH (ref 4.8–5.6)

## 2023-03-08 MED ORDER — PROPRANOLOL HCL 10 MG PO TABS: 10.0000 mg | ORAL_TABLET | Freq: Three times a day (TID) | ORAL | 4 refills | Status: AC

## 2023-03-08 NOTE — Patient Instructions (Signed)
 Medication Instructions:  START Propranolol 10mg  four times daily as needed *If you need a refill on your cardiac medications before your next appointment, please call your pharmacy*  Follow-Up: At [facility] you and your health needs are our priority.  As part of our continuing mission to provide you with exceptional heart care, we have created designated Provider Care Teams.  These Care Teams include your primary Cardiologist (physician) and Advanced Practice Providers (APPs -  Physician Assistants and Nurse Practitioners) who all work together to provide you with the care you need, when you need it.  Your next appointment:   6 month(s)  Provider:   Tereso Newcomer, PA     1st Floor: - Lobby - Registration  - Pharmacy  - Lab - Cafe  2nd Floor: - PV Lab - Diagnostic Testing (echo, CT, nuclear med)  3rd Floor: - Vacant  4th Floor: - TCTS (cardiothoracic surgery) - AFib Clinic - Structural Heart Clinic - Vascular Surgery  - Vascular Ultrasound  5th Floor: - [facility] (general and EP) - Clinical Pharmacy for coumadin, hypertension, lipid, weight-loss medications, and med management appointments    Valet parking services will be available as well.

## 2023-03-09 ENCOUNTER — Encounter: Payer: Self-pay | Admitting: Family Medicine

## 2023-03-10 LAB — MICROALBUMIN / CREATININE URINE RATIO
Creatinine, Urine: 219.9 mg/dL
Microalb/Creat Ratio: 4 mg/g{creat} (ref 0–29)
Microalbumin, Urine: 7.9 ug/mL

## 2023-03-11 ENCOUNTER — Encounter: Payer: Self-pay | Admitting: Family Medicine

## 2023-03-11 NOTE — Progress Notes (Signed)
 Your labs results look good, your A1c is 6.4 well controlled. Kidney functions test are normal. Liver function test are normal. Alkaline phosphate is slightly above normal, we will monitor it,  Thanks

## 2023-03-14 ENCOUNTER — Ambulatory Visit: Payer: Self-pay | Admitting: Family Medicine

## 2023-03-14 ENCOUNTER — Other Ambulatory Visit: Payer: Self-pay | Admitting: Student

## 2023-03-21 ENCOUNTER — Ambulatory Visit: Payer: Self-pay | Admitting: Family Medicine

## 2023-05-06 ENCOUNTER — Ambulatory Visit

## 2023-05-06 DIAGNOSIS — Z23 Encounter for immunization: Secondary | ICD-10-CM

## 2023-05-06 DIAGNOSIS — Z0000 Encounter for general adult medical examination without abnormal findings: Secondary | ICD-10-CM | POA: Diagnosis not present

## 2023-05-06 NOTE — Progress Notes (Signed)
 Subjective:   Brianna Hawkins is a 63 y.o. who presents for a Medicare Wellness preventive visit.  Visit Complete: Virtual I connected with  Brianna Hawkins on 05/06/23 by a audio enabled telemedicine application and verified that I am speaking with the correct person using two identifiers.  Patient Location: Home  Provider Location: Office/Clinic  I discussed the limitations of evaluation and management by telemedicine. The patient expressed understanding and agreed to proceed.  Vital Signs: Because this visit was a virtual/telehealth visit, some criteria may be missing or patient reported. Any vitals not documented were not able to be obtained and vitals that have been documented are patient reported.  VideoError- Librarian, academic were attempted between this provider and patient, however failed, due to patient having technical difficulties OR patient did not have access to video capability.  We continued and completed visit with audio only.   Persons Participating in Visit: Patient.  AWV Questionnaire: No: Patient Medicare AWV questionnaire was not completed prior to this visit.  Cardiac Risk Factors include: advanced age (>71men, >29 women);diabetes mellitus;dyslipidemia;hypertension     Objective:    Today's Vitals   05/06/23 0931  PainSc: 8    There is no height or weight on file to calculate BMI.     05/06/2023    9:43 AM 02/22/2023    9:15 PM 11/27/2022    2:53 PM 10/13/2021    9:45 AM 10/03/2021    2:04 PM 06/23/2021   10:37 AM 06/08/2021    1:42 PM  Advanced Directives  Does Patient Have a Medical Advance Directive? No No No No No No No  Would patient like information on creating a medical advance directive? No - Patient declined  No - Patient declined No - Patient declined No - Patient declined Yes (MAU/Ambulatory/Procedural Areas - Information given)     Current Medications (verified) Outpatient Encounter Medications as of 05/06/2023   Medication Sig   AGAMATRIX ULTRA-THIN LANCETS MISC Check sugars twice daily before meals   albuterol  (VENTOLIN  HFA) 108 (90 Base) MCG/ACT inhaler Inhale 2 puffs into the lungs every 6 (six) hours as needed for wheezing or shortness of breath.   amLODipine  (NORVASC ) 10 MG tablet Take 1 tablet by mouth once daily   aspirin  EC 81 MG tablet Take 1 tablet (81 mg total) by mouth daily.   Blood Glucose Monitoring Suppl (AGAMATRIX PRESTO) w/Device KIT Check sugars twice daily before meals   Cholecalciferol (VITAMIN D3) 50 MCG (2000 UT) TABS Take 5,000 Units by mouth daily.    esomeprazole  (NEXIUM ) 40 MG capsule Take 1 capsule by mouth once daily   ezetimibe  (ZETIA ) 10 MG tablet Take 1 tablet (10 mg total) by mouth daily.   fluticasone  (FLONASE ) 50 MCG/ACT nasal spray Place 2 sprays into both nostrils daily.   glucose blood (AGAMATRIX PRESTO TEST) test strip Check blood glucose twice daily before meals   hydrochlorothiazide  (HYDRODIURIL ) 25 MG tablet Take 1 tablet by mouth once daily   metFORMIN  (GLUCOPHAGE -XR) 500 MG 24 hr tablet TAKE 1 TABLET BY MOUTH TWICE DAILY WITH A MEAL   metFORMIN  (GLUCOPHAGE -XR) 500 MG 24 hr tablet TAKE 1 TABLET BY MOUTH TWICE DAILY WITH A MEAL   Multiple Vitamins-Minerals (HAIR/SKIN/NAILS) TABS Take 1 tablet by mouth daily.   nitroGLYCERIN  (NITROSTAT ) 0.4 MG SL tablet 1 tab under tongue for chest pressure, may repeat every 5 minutes if not relieved for total of 3 doses.   ondansetron  (ZOFRAN ) 4 MG tablet Take 1 tablet (4  mg total) by mouth every 8 (eight) hours as needed for nausea or vomiting.   propranolol  (INDERAL ) 10 MG tablet Take 1 tablet (10 mg total) by mouth 3 (three) times daily.   rizatriptan  (MAXALT ) 10 MG tablet Take 10 mg by mouth as needed for migraine. May repeat in 2 hours if needed   ibuprofen  (ADVIL ) 800 MG tablet Take 1 tablet (800 mg total) by mouth every 8 (eight) hours as needed. (Patient not taking: Reported on 05/06/2023)   rosuvastatin  (CRESTOR ) 20 MG  tablet Take 1 tablet (20 mg total) by mouth daily.   No facility-administered encounter medications on file as of 05/06/2023.    Allergies (verified) Patient has no known allergies.   History: Past Medical History:  Diagnosis Date   Coronary artery calcification seen on CT scan    cardiologist-- dr Floria Hurst; Coronary CTA 9/19: Ca score = 1 (80th percentile); calcified plaque distal LM; no obstructive CAD.   DM2 (diabetes mellitus, type 2) (HCC)    followed by pcp-- (09-29-2020 pt stated check blood sugar twice wkly in am,  fasting sugar--- unsure per pt)   DOE (dyspnea on exertion)    09-29-2020  per pt sob w/ any activity , especially stairs, stated uses rescue inhaler and recovers quickly   Endometrial polyp    Hysteroscopy D & C 10/22   GERD (gastroesophageal reflux disease)    H/O: substance abuse (HCC)    per pt last used crack cocaine in 1998   Heart murmur    last echo in epic 03-26-2019, ef 50-55%, trivial TR   History of syncope 2014   vasovagal   History of treatment for tuberculosis 1998   per pt treated in 1998;  pt had bronchoscopy w/ bx's RUL 10-11-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   HLD (hyperlipidemia)    Hypertension    Migraine    OA (osteoarthritis)    PMB (postmenopausal bleeding)    Postmenopausal bleeding 07/01/2020   Tuberculosis 01/02/1996   7 YEARS AGO   Urinary urgency    Vertigo    Past Surgical History:  Procedure Laterality Date   COLONOSCOPY WITH ESOPHAGOGASTRODUODENOSCOPY (EGD)  09/05/2011   by dr Adan Holms   ETHMOIDECTOMY Left 07/10/2013   Procedure: ETHMOIDECTOMY LEFT,MAXILLARY OSTIAL ENLARGEMENT WITH REMOVAL OF DISEASE;  Surgeon: Prescott Brodie, MD;  Location: [facility];  Service: ENT;  Laterality: Left;   HYSTEROSCOPY WITH D & C N/A 10/05/2020   Procedure: DILATATION AND CURETTAGE /HYSTEROSCOPY;  Surgeon: Othelia Blinks, MD;  Location: [facility];  Service: Gynecology;  Laterality: N/A;    KNEE SURGERY     MYOMECTOMY     NASAL POLYP SURGERY     SOFT TISSUE MASS EXCISION Right 05/17/2008   @[facility] ;  right axilla  (benign)   TUBAL LIGATION Bilateral    1980s   TURBINATE REDUCTION Left 07/10/2013   Procedure: LEFT TURBINATE REDUCTION;  Surgeon: Prescott Brodie, MD;  Location: [facility];  Service: ENT;  Laterality: Left;   Family History  Problem Relation Age of Onset   Alcohol abuse Mother    Diabetes Mother    Heart disease Mother    Cancer Mother    Esophageal cancer Father    Rectal cancer Father    Throat cancer Father        was a smoker   Colon cancer Father    Alcohol abuse Father    Cancer Father 13  colon cancer   HIV Sister    Heart disease Sister 70       died age 45 "Heart was weak"   Stroke Sister    Stomach cancer Maternal Aunt    Diabetes Maternal Grandmother    Diabetes Paternal Grandmother    Dementia Paternal Grandmother    Hypertension Paternal Grandmother    Breast cancer Paternal Grandmother    Skin cancer Paternal Grandmother    Sudden Cardiac Death Neg Hx    Social History   Socioeconomic History   Marital status: Married    Spouse name: Not on file   Number of children: 4   Years of education: 11   Highest education level: Not on file  Occupational History   Occupation: unemployed    Employer: [employer]    Comment: house keeping   Occupation: [employer]  Tobacco Use   Smoking status: Former    Current packs/day: 0.00    Types: Cigarettes    Start date: 1985    Quit date: 2000    Years since quitting: 25.3   Smokeless tobacco: Never  Vaping Use   Vaping status: Never Used  Substance and Sexual Activity   Alcohol use: Not Currently    Comment: occasional   Drug use: Not Currently    Comment: per pt Last crack cocaine 1998   Sexual activity: Not on file  Other Topics Concern   Not on file  Social History Narrative   Single. Education: [employer].   Right-handed.   1-2 cups caffeine  per  day.   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   Social Drivers of Corporate investment banker Strain: Low Risk  (05/06/2023)   Overall Financial Resource Strain (CARDIA)    Difficulty of Paying Living Expenses: Not hard at all  Food Insecurity: No Food Insecurity (05/06/2023)   Hunger Vital Sign    Worried About Running Out of Food in the Last Year: Never true    Ran Out of Food in the Last Year: Never true  Transportation Needs: No Transportation Needs (05/06/2023)   PRAPARE - Administrator, Civil Service (Medical): No    Lack of Transportation (Non-Medical): No  Physical Activity: Insufficiently Active (05/06/2023)   Exercise Vital Sign    Days of Exercise per Week: 3 days    Minutes of Exercise per Session: 20 min  Stress: No Stress Concern Present (05/06/2023)   [employer] of Occupational Health - Occupational Stress Questionnaire    Feeling of Stress : Only a little  Social Connections: Moderately Isolated (05/06/2023)   Social Connection and Isolation Panel [NHANES]    Frequency of Communication with Friends and Family: More than three times a week    Frequency of Social Gatherings with Friends and Family: Not on file    Attends Religious Services: Never    Database administrator or Organizations: No    Attends Engineer, structural: Never    Marital Status: Married    Tobacco Counseling Counseling given: Not Answered    Clinical Intake:  Pre-visit preparation completed: Yes  Pain : 0-10 Pain Score: 8  Pain Type: Chronic pain Pain Location: Back (chest) Pain Descriptors / Indicators: Sharp Pain Onset: More than a month ago Pain Frequency: Constant     Nutritional Risks: Non-healing wound Diabetes: Yes CBG done?: No Did pt. bring in CBG monitor from home?: No  Lab Results  Component Value Date  HGBA1C 6.4 (H) 03/07/2023   HGBA1C 5.9 10/13/2021   HGBA1C 6.1 04/11/2021     How often do you need to  have someone help you when you read instructions, pamphlets, or other written materials from your doctor or pharmacy?: 1 - Never  Interpreter Needed?: No  Information entered by :: NAllen LPN   Activities of Daily Living     05/06/2023    9:37 AM  In your present state of health, do you have any difficulty performing the following activities:  Hearing? 0  Vision? 1  Comment sometimes with high blood sugar  Difficulty concentrating or making decisions? 0  Walking or climbing stairs? 0  Dressing or bathing? 0  Doing errands, shopping? 0  Preparing Food and eating ? N  Using the Toilet? N  In the past six months, have you accidently leaked urine? N  Do you have problems with loss of bowel control? N  Managing your Medications? N  Managing your Finances? N  Housekeeping or managing your Housekeeping? N    Patient Care Team: [name], NP as PCP - General (Family Medicine) Nahser, Lela Purple, MD as PCP - Cardiology (Cardiology) Sherwood Donath as Physician Assistant (Cardiology) Buck Carbon, [place], [place] as Referring Physician (Optometry)  Indicate any recent Medical Services you may have received from other than [facility] providers in the past year (date may be approximate).     Assessment:   This is a routine wellness examination for Emmajane.  Hearing/Vision screen Hearing Screening - Comments:: Denies hearing issues Vision Screening - Comments:: Regular eye exams, [facility]   Goals Addressed             This Visit's Progress    Patient Stated       05/06/2023, wants to get healthier       Depression Screen     05/06/2023    9:45 AM 03/07/2023   10:19 AM 03/20/2022    9:40 AM 10/13/2021    9:45 AM 10/03/2021    2:04 PM 06/23/2021   10:37 AM 06/08/2021    1:41 PM  PHQ 2/9 Scores  PHQ - 2 Score 1 4 0 1 1 0 1  PHQ- 9 Score 4 9  3 3 2 4     Fall Risk     05/06/2023    9:44 AM 03/07/2023   10:19 AM 03/20/2022    9:40 AM 07/01/2020    9:05 AM 11/12/2018    9:00 AM  Fall  Risk   Falls in the past year? 1 0 0 0 0  Comment fell out the chair      Number falls in past yr: 0 0 0 0 0  Injury with Fall? 0 0 0 0   Risk for fall due to : Medication side effect No Fall Risks No Fall Risks    Follow up Falls prevention discussed;Falls evaluation completed Falls evaluation completed Falls evaluation completed  Falls evaluation completed    MEDICARE RISK AT HOME:  Medicare Risk at Home Any stairs in or around the home?: No If so, are there any without handrails?: No Home free of loose throw rugs in walkways, pet beds, electrical cords, etc?: Yes Adequate lighting in your home to reduce risk of falls?: Yes Life alert?: No Use of a cane, walker or w/c?: No Grab bars in the bathroom?: No Shower chair or bench in shower?: No Elevated toilet seat or a handicapped toilet?: No  TIMED UP AND GO:  Was the test performed?  No  Cognitive Function: 6CIT completed        05/06/2023    9:46 AM 03/20/2022    9:41 AM  6CIT Screen  What Year? 0 points 0 points  What month? 0 points 0 points  What time? 0 points 0 points  Count back from 20 0 points 0 points  Months in reverse 2 points 0 points  Repeat phrase 0 points 2 points  Total Score 2 points 2 points    Immunizations Immunization History  Administered Date(s) Administered   Influenza Inj Mdck Quad With Preservative 11/19/2016   Influenza Split 11/01/2013   Influenza Whole 10/02/2010   Influenza,inj,Quad PF,6+ Mos 11/07/2017, 10/13/2021   Influenza-Unspecified 11/26/2016, 09/24/2021   Janssen (J&J) SARS-COV-2 Vaccination 03/16/2019, 11/04/2019   Pfizer Covid-19 Vaccine Bivalent Booster 33yrs & up 10/21/2020   Tdap 11/07/2017   Zoster, Unspecified 07/04/2021, 09/24/2021    Screening Tests Health Maintenance  Topic Date Due   OPHTHALMOLOGY EXAM  Never done   Pneumococcal Vaccine 39-8 Years old (1 of 2 - PCV) Never done   Zoster Vaccines- Shingrix  (1 of 2) 12/17/1981   COVID-19 Vaccine (4 - 2024-25  season) 09/02/2022   FOOT EXAM  10/14/2022   INFLUENZA VACCINE  08/02/2023   Cervical Cancer Screening (HPV/Pap Cotest)  08/11/2023   HEMOGLOBIN A1C  09/07/2023   Diabetic kidney evaluation - eGFR measurement  03/06/2024   Diabetic kidney evaluation - Urine ACR  03/07/2024   Medicare Annual Wellness (AWV)  05/05/2024   MAMMOGRAM  07/23/2024   Colonoscopy  11/22/2026   DTaP/Tdap/Td (2 - Td or Tdap) 11/08/2027   Hepatitis C Screening  Completed   HIV Screening  Completed   HPV VACCINES  Aged Out   Meningococcal B Vaccine  Aged Out    Health Maintenance  Health Maintenance Due  Topic Date Due   OPHTHALMOLOGY EXAM  Never done   Pneumococcal Vaccine 89-102 Years old (1 of 2 - PCV) Never done   Zoster Vaccines- Shingrix  (1 of 2) 12/17/1981   COVID-19 Vaccine (4 - 2024-25 season) 09/02/2022   FOOT EXAM  10/14/2022   Health Maintenance Items Addressed: States had pneumonia and covid at [facility]. Will call to verify. Will request DM eye exam.  Additional Screening:  Vision Screening: Recommended annual ophthalmology exams for early detection of glaucoma and other disorders of the eye.  Dental Screening: Recommended annual dental exams for proper oral hygiene  Community Resource Referral / Chronic Care Management: CRR required this visit?  No   CCM required this visit?  No     Plan:     I have personally reviewed and noted the following in the patient's chart:   Medical and social history Use of alcohol, tobacco or illicit drugs  Current medications and supplements including opioid prescriptions. Patient is not currently taking opioid prescriptions. Functional ability and status Nutritional status Physical activity Advanced directives List of other physicians Hospitalizations, surgeries, and ER visits in previous 12 months Vitals Screenings to include cognitive, depression, and falls Referrals and appointments  In addition, I have reviewed and discussed with patient  certain preventive protocols, quality metrics, and best practice recommendations. A written personalized care plan for preventive services as well as general preventive health recommendations were provided to patient.     Areatha Beecham, LPN   0/09/8117   After Visit Summary: (MyChart) Due to this being a telephonic visit, the after visit summary with patients personalized plan was offered to patient  via MyChart   Notes: Nothing significant to report at this time.

## 2023-05-06 NOTE — Patient Instructions (Signed)
 Brianna Hawkins , Thank you for taking time to come for your Medicare Wellness Visit. I appreciate your ongoing commitment to your health goals. Please review the following plan we discussed and let me know if I can assist you in the future.   Referrals/Orders/Follow-Ups/Clinician Recommendations: back pain. Making appointment with Deatra Face.  This is a list of the screening recommended for you and due dates:  Health Maintenance  Topic Date Due   Eye exam for diabetics  Never done   Pneumococcal Vaccination (1 of 2 - PCV) Never done   Zoster (Shingles) Vaccine (1 of 2) 12/17/1981   COVID-19 Vaccine (4 - 2024-25 season) 09/02/2022   Complete foot exam   10/14/2022   Flu Shot  08/02/2023   Pap with HPV screening  08/11/2023   Hemoglobin A1C  09/07/2023   Yearly kidney function blood test for diabetes  03/06/2024   Yearly kidney health urinalysis for diabetes  03/07/2024   Medicare Annual Wellness Visit  05/05/2024   Mammogram  07/23/2024   Colon Cancer Screening  11/22/2026   DTaP/Tdap/Td vaccine (2 - Td or Tdap) 11/08/2027   Hepatitis C Screening  Completed   HIV Screening  Completed   HPV Vaccine  Aged Out   Meningitis B Vaccine  Aged Out    Advanced directives: (Declined) Advance directive discussed with you today. Even though you declined this today, please call our office should you change your mind, and we can give you the proper paperwork for you to fill out.  Next Medicare Annual Wellness Visit scheduled for next year: No, office will schedule  Have you seen your provider in the last 6 months (3 months if uncontrolled diabetes)? Yes, appointment made for 05/09/2023  insert Preventive Care attachment Insert FALL PREVENTION attachment if needed

## 2023-05-09 ENCOUNTER — Ambulatory Visit (INDEPENDENT_AMBULATORY_CARE_PROVIDER_SITE_OTHER): Admitting: Family Medicine

## 2023-05-09 ENCOUNTER — Encounter: Payer: Self-pay | Admitting: Family Medicine

## 2023-05-09 VITALS — BP 130/90 | HR 68 | Temp 99.0°F | Ht 66.0 in | Wt 246.0 lb

## 2023-05-09 DIAGNOSIS — N644 Mastodynia: Secondary | ICD-10-CM

## 2023-05-09 DIAGNOSIS — M25511 Pain in right shoulder: Secondary | ICD-10-CM

## 2023-05-09 DIAGNOSIS — J320 Chronic maxillary sinusitis: Secondary | ICD-10-CM | POA: Diagnosis not present

## 2023-05-09 DIAGNOSIS — E1169 Type 2 diabetes mellitus with other specified complication: Secondary | ICD-10-CM | POA: Diagnosis not present

## 2023-05-09 DIAGNOSIS — E785 Hyperlipidemia, unspecified: Secondary | ICD-10-CM

## 2023-05-09 DIAGNOSIS — E66812 Obesity, class 2: Secondary | ICD-10-CM

## 2023-05-09 DIAGNOSIS — Z6839 Body mass index (BMI) 39.0-39.9, adult: Secondary | ICD-10-CM

## 2023-05-09 DIAGNOSIS — R0981 Nasal congestion: Secondary | ICD-10-CM

## 2023-05-09 MED ORDER — AMOXICILLIN-POT CLAVULANATE 875-125 MG PO TABS: 1.0000 | ORAL_TABLET | Freq: Two times a day (BID) | ORAL | 0 refills | Status: AC

## 2023-05-09 MED ORDER — DICLOFENAC SODIUM 50 MG PO TBEC: 50.0000 mg | DELAYED_RELEASE_TABLET | Freq: Two times a day (BID) | ORAL | 0 refills | Status: AC

## 2023-05-09 MED ORDER — EZETIMIBE 10 MG PO TABS: 10.0000 mg | ORAL_TABLET | Freq: Every day | ORAL | 1 refills | Status: AC

## 2023-05-09 NOTE — Progress Notes (Signed)
 I,[name], CMA,acting as a Neurosurgeon for [employer], NP.,have documented all relevant documentation on the behalf of [name], NP,as directed by  [name], NP while in the presence of [name], NP.  Subjective:  Patient ID: Brianna Hawkins , female    DOB: 1960/03/31 , 63 y.o.   MRN: [id number]  Chief Complaint  Patient presents with   Shoulder Pain    HPI  Patient is a 63 year old female who presents today for left shoulder pain. She reports the pain radiates into her back. She also reports feeling some lumps on the side of her breast. Patient also complains of an odor coming from her left nostril and when she blows her nose her mucus is green.  Patient states she has not had her BP meds today  Shoulder Pain  The pain is present in the neck and left shoulder. This is a recurrent problem. The current episode started more than 1 month ago. There has been no history of extremity trauma. The problem has been waxing and waning. The quality of the pain is described as aching. The pain is at a severity of 8/10. The pain is severe. Pertinent negatives include no fever.     Past Medical History:  Diagnosis Date   Coronary artery calcification seen on CT scan    cardiologist-- dr Floria Hurst; Coronary CTA 9/19: Ca score = 1 (80th percentile); calcified plaque distal LM; no obstructive CAD.   DM2 (diabetes mellitus, type 2) (HCC)    followed by pcp-- (09-29-2020 pt stated check blood sugar twice wkly in am,  fasting sugar--- unsure per pt)   DOE (dyspnea on exertion)    09-29-2020  per pt sob w/ any activity , especially stairs, stated uses rescue inhaler and recovers quickly   Endometrial polyp    Hysteroscopy D & C 10/22   GERD (gastroesophageal reflux disease)    H/O: substance abuse (HCC)    per pt last used crack cocaine in 1998   Heart murmur    last echo in epic 03-26-2019, ef 50-55%, trivial TR   History of syncope 2014   vasovagal   History of treatment for  tuberculosis 1998   per pt treated in 1998;  pt had bronchoscopy w/ bx's RUL 10-11-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   HLD (hyperlipidemia)    Hypertension    Migraine    OA (osteoarthritis)    PMB (postmenopausal bleeding)    Postmenopausal bleeding 07/01/2020   Tuberculosis 01/02/1996   7 YEARS AGO   Urinary urgency    Vertigo      Family History  Problem Relation Age of Onset   Alcohol abuse Mother    Diabetes Mother    Heart disease Mother    Cancer Mother    Esophageal cancer Father    Rectal cancer Father    Throat cancer Father        was a smoker   Colon cancer Father    Alcohol abuse Father    Cancer Father 61       colon cancer   HIV Sister    Heart disease Sister 24       died age 54 "Heart was weak"   Stroke Sister    Stomach cancer Maternal Aunt    Diabetes Maternal Grandmother    Diabetes Paternal Grandmother    Dementia Paternal Grandmother    Hypertension Paternal Grandmother    Breast cancer Paternal Grandmother  Skin cancer Paternal Grandmother    Sudden Cardiac Death Neg Hx      Current Outpatient Medications:    AGAMATRIX ULTRA-THIN LANCETS MISC, Check sugars twice daily before meals, Disp: 100 each, Rfl: 11   albuterol  (VENTOLIN  HFA) 108 (90 Base) MCG/ACT inhaler, Inhale 2 puffs into the lungs every 6 (six) hours as needed for wheezing or shortness of breath., Disp: , Rfl:    amLODipine  (NORVASC ) 10 MG tablet, Take 1 tablet by mouth once daily, Disp: 90 tablet, Rfl: 0   amoxicillin -clavulanate (AUGMENTIN ) 875-125 MG tablet, Take 1 tablet by mouth 2 (two) times daily for 7 days., Disp: 14 tablet, Rfl: 0   aspirin  EC 81 MG tablet, Take 1 tablet (81 mg total) by mouth daily., Disp: , Rfl:    Blood Glucose Monitoring Suppl (AGAMATRIX PRESTO) w/Device KIT, Check sugars twice daily before meals, Disp: 1 kit, Rfl: 0   Cholecalciferol (VITAMIN D3) 50 MCG (2000 UT) TABS, Take 5,000 Units by mouth daily. , Disp: , Rfl:    diclofenac  (VOLTAREN )  50 MG EC tablet, Take 1 tablet (50 mg total) by mouth 2 (two) times daily., Disp: 20 tablet, Rfl: 0   esomeprazole  (NEXIUM ) 40 MG capsule, Take 1 capsule by mouth once daily, Disp: 30 capsule, Rfl: 0   fluticasone  (FLONASE ) 50 MCG/ACT nasal spray, Place 2 sprays into both nostrils daily., Disp: 16 g, Rfl: 3   glucose blood (AGAMATRIX PRESTO TEST) test strip, Check blood glucose twice daily before meals, Disp: 100 each, Rfl: 11   hydrochlorothiazide  (HYDRODIURIL ) 25 MG tablet, Take 1 tablet by mouth once daily, Disp: 90 tablet, Rfl: 0   ibuprofen  (ADVIL ) 800 MG tablet, Take 1 tablet (800 mg total) by mouth every 8 (eight) hours as needed., Disp: 30 tablet, Rfl: 0   metFORMIN  (GLUCOPHAGE -XR) 500 MG 24 hr tablet, TAKE 1 TABLET BY MOUTH TWICE DAILY WITH A MEAL, Disp: 60 tablet, Rfl: 0   metFORMIN  (GLUCOPHAGE -XR) 500 MG 24 hr tablet, TAKE 1 TABLET BY MOUTH TWICE DAILY WITH A MEAL, Disp: 60 tablet, Rfl: 0   Multiple Vitamins-Minerals (HAIR/SKIN/NAILS) TABS, Take 1 tablet by mouth daily., Disp: , Rfl:    nitroGLYCERIN  (NITROSTAT ) 0.4 MG SL tablet, 1 tab under tongue for chest pressure, may repeat every 5 minutes if not relieved for total of 3 doses., Disp: 24 tablet, Rfl: 3   ondansetron  (ZOFRAN ) 4 MG tablet, Take 1 tablet (4 mg total) by mouth every 8 (eight) hours as needed for nausea or vomiting., Disp: 6 tablet, Rfl: 0   propranolol  (INDERAL ) 10 MG tablet, Take 1 tablet (10 mg total) by mouth 3 (three) times daily., Disp: 120 tablet, Rfl: 4   rizatriptan  (MAXALT ) 10 MG tablet, Take 10 mg by mouth as needed for migraine. May repeat in 2 hours if needed, Disp: , Rfl:    ezetimibe  (ZETIA ) 10 MG tablet, Take 1 tablet (10 mg total) by mouth daily., Disp: 90 tablet, Rfl: 1   rosuvastatin  (CRESTOR ) 20 MG tablet, Take 1 tablet (20 mg total) by mouth daily., Disp: 90 tablet, Rfl: 3   No Known Allergies   Review of Systems  Constitutional: Negative.  Negative for chills and fever.  HENT:  Positive for  congestion, rhinorrhea and sinus pressure. Negative for sinus pain.   Respiratory: Negative.    Cardiovascular: Negative.   Neurological: Negative.   Psychiatric/Behavioral: Negative.       Today's Vitals   05/09/23 1504  BP: (!) 130/90  Pulse: 68  Temp: 99 F (  37.2 C)  TempSrc: Oral  Weight: 246 lb (111.6 kg)  Height: 5\' 6"  (1.676 m)  PainSc: 8   PainLoc: Shoulder   Body mass index is 39.71 kg/m.  Wt Readings from Last 3 Encounters:  05/09/23 246 lb (111.6 kg)  03/08/23 240 lb (108.9 kg)  03/07/23 241 lb 3.2 oz (109.4 kg)    The 10-year ASCVD risk score (Arnett DK, et al., 2019) is: 12.8%   Values used to calculate the score:     Age: 25 years     Sex: Female     Is Non-Hispanic African American: Yes     Diabetic: Yes     Tobacco smoker: No     Systolic Blood Pressure: 130 mmHg     Is BP treated: Yes     HDL Cholesterol: 50 mg/dL     Total Cholesterol: 143 mg/dL  Objective:  Physical Exam HENT:     Head: Normocephalic.     Nose: Congestion and rhinorrhea present.     Left Sinus: Maxillary sinus tenderness present.  Cardiovascular:     Rate and Rhythm: Normal rate and regular rhythm.  Pulmonary:     Effort: Pulmonary effort is normal.     Breath sounds: Normal breath sounds.  Chest:  Breasts:    Left: Tenderness present.  Neurological:     General: No focal deficit present.     Mental Status: She is alert and oriented to person, place, and time.         Assessment And Plan:  Acute pain of right shoulder -     Diclofenac  Sodium; Take 1 tablet (50 mg total) by mouth 2 (two) times daily.  Dispense: 20 tablet; Refill: 0  Pain of left breast -     MM 3D DIAGNOSTIC MAMMOGRAM UNILATERAL LEFT BREAST; Future  Chronic maxillary sinusitis -     Amoxicillin -Pot Clavulanate; Take 1 tablet by mouth 2 (two) times daily for 7 days.  Dispense: 14 tablet; Refill: 0  Hyperlipidemia associated with type 2 diabetes mellitus [facility]) Assessment & Plan: Lipid Panel      Component Value Date/Time   CHOL 143 03/07/2023 1107   TRIG 68 03/07/2023 1107   HDL 50 03/07/2023 1107   CHOLHDL 2.9 03/07/2023 1107   CHOLHDL 3.5 10/02/2012 0934   VLDL 15 10/02/2012 0934   LDLCALC 79 03/07/2023 1107   LABVLDL 14 03/07/2023 1107   Continue current treatment plan with crestor  20 mg and zetia  10 mg every day.  Orders: -     Ezetimibe ; Take 1 tablet (10 mg total) by mouth daily.  Dispense: 90 tablet; Refill: 1  Class 2 obesity with body mass index (BMI) of 39.0 to 39.9 in adult, unspecified obesity type, unspecified whether serious comorbidity present Assessment & Plan: She is encouraged to strive for BMI less than 30 to decrease cardiac risk. Advised to aim for at least 150 minutes of exercise per week.      Return in 5 weeks (on 06/13/2023), or if symptoms worsen or fail to improve.  Patient was given opportunity to ask questions. Patient verbalized understanding of the plan and was able to repeat key elements of the plan. All questions were answered to their satisfaction.    I, [name], NP, have reviewed all documentation for this visit. The documentation on 05/16/2023 for the exam, diagnosis, procedures, and orders are all accurate and complete.     IF YOU HAVE BEEN REFERRED TO A SPECIALIST, IT MAY TAKE 1-2 WEEKS TO  SCHEDULE/PROCESS THE REFERRAL. IF YOU HAVE NOT HEARD FROM US /SPECIALIST IN TWO WEEKS, PLEASE GIVE US  A CALL AT [phone].

## 2023-05-10 ENCOUNTER — Other Ambulatory Visit: Payer: Self-pay | Admitting: Family Medicine

## 2023-05-10 DIAGNOSIS — N644 Mastodynia: Secondary | ICD-10-CM

## 2023-05-10 DIAGNOSIS — M25511 Pain in right shoulder: Secondary | ICD-10-CM

## 2023-05-16 DIAGNOSIS — Z6839 Body mass index (BMI) 39.0-39.9, adult: Secondary | ICD-10-CM | POA: Insufficient documentation

## 2023-05-16 DIAGNOSIS — E66812 Obesity, class 2: Secondary | ICD-10-CM | POA: Insufficient documentation

## 2023-05-16 DIAGNOSIS — N644 Mastodynia: Secondary | ICD-10-CM | POA: Insufficient documentation

## 2023-05-16 DIAGNOSIS — R0981 Nasal congestion: Secondary | ICD-10-CM | POA: Insufficient documentation

## 2023-05-16 DIAGNOSIS — M25511 Pain in right shoulder: Secondary | ICD-10-CM | POA: Insufficient documentation

## 2023-05-16 NOTE — Assessment & Plan Note (Signed)
 She is encouraged to strive for BMI less than 30 to decrease cardiac risk. Advised to aim for at least 150 minutes of exercise per week.

## 2023-05-16 NOTE — Assessment & Plan Note (Signed)
 Lipid Panel     Component Value Date/Time   CHOL 143 03/07/2023 1107   TRIG 68 03/07/2023 1107   HDL 50 03/07/2023 1107   CHOLHDL 2.9 03/07/2023 1107   CHOLHDL 3.5 10/02/2012 0934   VLDL 15 10/02/2012 0934   LDLCALC 79 03/07/2023 1107   LABVLDL 14 03/07/2023 1107   Continue current treatment plan with crestor  20 mg and zetia  10 mg every day.

## 2023-05-30 ENCOUNTER — Ambulatory Visit
Admission: RE | Admit: 2023-05-30 | Discharge: 2023-05-30 | Disposition: A | Discharge status: Home / Self Care | Source: Ambulatory Visit | Attending: Family Medicine | Admitting: Family Medicine

## 2023-05-30 DIAGNOSIS — N644 Mastodynia: Secondary | ICD-10-CM

## 2023-05-30 DIAGNOSIS — M25511 Pain in right shoulder: Secondary | ICD-10-CM

## 2023-06-13 ENCOUNTER — Ambulatory Visit: Admitting: Family Medicine

## 2023-06-13 ENCOUNTER — Encounter: Payer: Self-pay | Admitting: Family Medicine

## 2023-06-13 VITALS — BP 140/80 | HR 91 | Temp 98.5°F | Ht 66.0 in | Wt 238.2 lb

## 2023-06-13 DIAGNOSIS — I152 Hypertension secondary to endocrine disorders: Secondary | ICD-10-CM

## 2023-06-13 DIAGNOSIS — E1159 Type 2 diabetes mellitus with other circulatory complications: Secondary | ICD-10-CM | POA: Diagnosis not present

## 2023-06-13 DIAGNOSIS — E785 Hyperlipidemia, unspecified: Secondary | ICD-10-CM | POA: Diagnosis not present

## 2023-06-13 DIAGNOSIS — E1169 Type 2 diabetes mellitus with other specified complication: Secondary | ICD-10-CM | POA: Diagnosis not present

## 2023-06-13 DIAGNOSIS — K219 Gastro-esophageal reflux disease without esophagitis: Secondary | ICD-10-CM

## 2023-06-13 DIAGNOSIS — E1165 Type 2 diabetes mellitus with hyperglycemia: Secondary | ICD-10-CM | POA: Diagnosis not present

## 2023-06-13 DIAGNOSIS — E66812 Obesity, class 2: Secondary | ICD-10-CM

## 2023-06-13 DIAGNOSIS — Z6839 Body mass index (BMI) 39.0-39.9, adult: Secondary | ICD-10-CM

## 2023-06-13 MED ORDER — ESOMEPRAZOLE MAGNESIUM 40 MG PO CPDR: 40.0000 mg | DELAYED_RELEASE_CAPSULE | Freq: Every day | ORAL | 2 refills | Status: DC

## 2023-06-13 NOTE — Progress Notes (Signed)
 [place], CMA,acting as a Neurosurgeon for [name], NP.,have documented all relevant documentation on the behalf of [name], NP,as directed by  [name], NP while in the presence of [name], NP.  Subjective:  Patient ID: Brianna Hawkins , female    DOB: 01-07-1960 , 63 y.o.   MRN: [id number]  Chief Complaint  Patient presents with   Hypertension    Patient presents today for a BP check & DM check. Patient doesn't have any specific questions or concerns. Patient denies headaches, chest pain & sob.   Diabetes    HPI  Patient is a 63 year old female who is is the office today for chronic disease management.she has a history of chronic GERD, diabetes hypertension and hyperlipidemia.  She she has no complaints or concerns today     Past Medical History:  Diagnosis Date   Coronary artery calcification seen on CT scan    cardiologist-- dr Floria Hurst; Coronary CTA 9/19: Ca score = 1 (80th percentile); calcified plaque distal LM; no obstructive CAD.   DM2 (diabetes mellitus, type 2) (HCC)    followed by pcp-- (09-29-2020 pt stated check blood sugar twice wkly in am,  fasting sugar--- unsure per pt)   DOE (dyspnea on exertion)    09-29-2020  per pt sob w/ any activity , especially stairs, stated uses rescue inhaler and recovers quickly   Endometrial polyp    Hysteroscopy D & C 10/22   GERD (gastroesophageal reflux disease)    H/O: substance abuse (HCC)    per pt last used crack cocaine in 1998   Heart murmur    last echo in epic 03-26-2019, ef 50-55%, trivial TR   History of syncope 2014   vasovagal   History of treatment for tuberculosis 1998   per pt treated in 1998;  pt had bronchoscopy w/ bx's RUL 10-11-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   HLD (hyperlipidemia)    Hypertension    Migraine    OA (osteoarthritis)    PMB (postmenopausal bleeding)    Postmenopausal bleeding 07/01/2020   Tuberculosis 01/02/1996   7 YEARS AGO   Urinary urgency    Vertigo       Family History  Problem Relation Age of Onset   Alcohol abuse Mother    Diabetes Mother    Heart disease Mother    Cancer Mother    Esophageal cancer Father    Rectal cancer Father    Throat cancer Father        was a smoker   Colon cancer Father    Alcohol abuse Father    Cancer Father 66       colon cancer   HIV Sister    Heart disease Sister 11       died age 29 Heart was weak   Stroke Sister    Stomach cancer Maternal Aunt    Diabetes Maternal Grandmother    Diabetes Paternal Grandmother    Dementia Paternal Grandmother    Hypertension Paternal Grandmother    Breast cancer Paternal Grandmother    Skin cancer Paternal Grandmother    Sudden Cardiac Death Neg Hx      Current Outpatient Medications:    AGAMATRIX ULTRA-THIN LANCETS MISC, Check sugars twice daily before meals, Disp: 100 each, Rfl: 11   albuterol  (VENTOLIN  HFA) 108 (90 Base) MCG/ACT inhaler, Inhale 2 puffs into the lungs every 6 (six) hours as needed for wheezing or shortness of breath., Disp: , Rfl:  amLODipine  (NORVASC ) 10 MG tablet, Take 1 tablet by mouth once daily, Disp: 90 tablet, Rfl: 0   aspirin  EC 81 MG tablet, Take 1 tablet (81 mg total) by mouth daily., Disp: , Rfl:    Blood Glucose Monitoring Suppl (AGAMATRIX PRESTO) w/Device KIT, Check sugars twice daily before meals, Disp: 1 kit, Rfl: 0   Cholecalciferol (VITAMIN D3) 50 MCG (2000 UT) TABS, Take 5,000 Units by mouth daily. , Disp: , Rfl:    diclofenac  (VOLTAREN ) 50 MG EC tablet, Take 1 tablet (50 mg total) by mouth 2 (two) times daily., Disp: 20 tablet, Rfl: 0   ezetimibe  (ZETIA ) 10 MG tablet, Take 1 tablet (10 mg total) by mouth daily., Disp: 90 tablet, Rfl: 1   fluticasone  (FLONASE ) 50 MCG/ACT nasal spray, Place 2 sprays into both nostrils daily., Disp: 16 g, Rfl: 3   glucose blood (AGAMATRIX PRESTO TEST) test strip, Check blood glucose twice daily before meals, Disp: 100 each, Rfl: 11   hydrochlorothiazide  (HYDRODIURIL ) 25 MG tablet, Take  1 tablet by mouth once daily, Disp: 90 tablet, Rfl: 0   ibuprofen  (ADVIL ) 800 MG tablet, Take 1 tablet (800 mg total) by mouth every 8 (eight) hours as needed., Disp: 30 tablet, Rfl: 0   metFORMIN  (GLUCOPHAGE -XR) 500 MG 24 hr tablet, TAKE 1 TABLET BY MOUTH TWICE DAILY WITH A MEAL, Disp: 60 tablet, Rfl: 0   metFORMIN  (GLUCOPHAGE -XR) 500 MG 24 hr tablet, TAKE 1 TABLET BY MOUTH TWICE DAILY WITH A MEAL, Disp: 60 tablet, Rfl: 0   Multiple Vitamins-Minerals (HAIR/SKIN/NAILS) TABS, Take 1 tablet by mouth daily., Disp: , Rfl:    nitroGLYCERIN  (NITROSTAT ) 0.4 MG SL tablet, 1 tab under tongue for chest pressure, may repeat every 5 minutes if not relieved for total of 3 doses., Disp: 24 tablet, Rfl: 3   ondansetron  (ZOFRAN ) 4 MG tablet, Take 1 tablet (4 mg total) by mouth every 8 (eight) hours as needed for nausea or vomiting., Disp: 6 tablet, Rfl: 0   propranolol  (INDERAL ) 10 MG tablet, Take 1 tablet (10 mg total) by mouth 3 (three) times daily., Disp: 120 tablet, Rfl: 4   rizatriptan  (MAXALT ) 10 MG tablet, Take 10 mg by mouth as needed for migraine. May repeat in 2 hours if needed, Disp: , Rfl:    rosuvastatin  (CRESTOR ) 20 MG tablet, Take 1 tablet (20 mg total) by mouth daily., Disp: 90 tablet, Rfl: 3   esomeprazole  (NEXIUM ) 40 MG capsule, Take 1 capsule (40 mg total) by mouth daily., Disp: 90 capsule, Rfl: 2   No Known Allergies   Review of Systems  Constitutional: Negative.   HENT: Negative.    Respiratory: Negative.    Cardiovascular: Negative.  Negative for chest pain, palpitations and leg swelling.  Endocrine: Negative for polydipsia, polyphagia and polyuria.  Skin: Negative.   Neurological: Negative.  Negative for numbness and headaches.     Today's Vitals   06/13/23 1434  BP: (!) 140/80  Pulse: 91  Temp: 98.5 F (36.9 C)  SpO2: 98%  Weight: 238 lb 3.2 oz (108 kg)  Height: 5' 6 (1.676 m)   Body mass index is 38.45 kg/m.  Wt Readings from Last 3 Encounters:  06/13/23 238 lb 3.2 oz  (108 kg)  05/09/23 246 lb (111.6 kg)  03/08/23 240 lb (108.9 kg)    The 10-year ASCVD risk score (Arnett DK, et al., 2019) is: 17.8%   Values used to calculate the score:     Age: 63 years     Clincally  relevant sex: Female     Is Non-Hispanic African American: Yes     Diabetic: Yes     Tobacco smoker: No     Systolic Blood Pressure: 140 mmHg     Is BP treated: Yes     HDL Cholesterol: 50 mg/dL     Total Cholesterol: 166 mg/dL  Objective:  Physical Exam HENT:     Head: Normocephalic.   Cardiovascular:     Rate and Rhythm: Normal rate and regular rhythm.  Pulmonary:     Effort: Pulmonary effort is normal.     Breath sounds: Normal breath sounds.  Abdominal:     General: There is no distension.     Tenderness: There is no abdominal tenderness.   Neurological:     General: No focal deficit present.     Mental Status: She is alert and oriented to person, place, and time.   Psychiatric:        Mood and Affect: Mood normal.        Behavior: Behavior normal.         Assessment And Plan:  Hypertension associated with diabetes (HCC) Assessment & Plan: BP fairly controlled. Continue Norvasc  10 mg once daily, HCTZ 25 mg once daily  Orders: -     BMP8+eGFR -     Hemoglobin A1c -     CBC  Hyperlipidemia associated with type 2 diabetes mellitus (HCC) Assessment & Plan: The current medical regimen is effective;  continue present plan and medications. Rosuvastain 20 mg every day   Orders: -     Lipid panel  Chronic GERD Assessment & Plan: Continue nexium  40 mg every day , avoid foods that trigger acid build up.  Orders: -     Esomeprazole  Magnesium ; Take 1 capsule (40 mg total) by mouth daily.  Dispense: 90 capsule; Refill: 2  Type 2 diabetes mellitus with hyperglycemia, without long-term current use of insulin (HCC) Assessment & Plan: Lab Results  Component Value Date   HGBA1C 6.0 (H) 06/13/2023   HGBA1C 6.4 (H) 03/07/2023   HGBA1C 5.9 10/13/2021   Continue  current treatment regimen  Orders: -     Hemoglobin A1c  Class 2 severe obesity due to excess calories with serious comorbidity and body mass index (BMI) of 39.0 to 39.9 in adult [facility]) Assessment & Plan: She is encouraged to strive for BMI less than 30 to decrease cardiac risk. Advised to aim for at least 150 minutes of exercise per week.      Return for controlled DM check 4 months.  Patient was given opportunity to ask questions. Patient verbalized understanding of the plan and was able to repeat key elements of the plan. All questions were answered to their satisfaction.    I, [name], NP, have reviewed all documentation for this visit. The documentation on 06/19/2023 for the exam, diagnosis, procedures, and orders are all accurate and complete.    IF YOU HAVE BEEN REFERRED TO A SPECIALIST, IT MAY TAKE 1-2 WEEKS TO SCHEDULE/PROCESS THE REFERRAL. IF YOU HAVE NOT HEARD FROM US /SPECIALIST IN TWO WEEKS, PLEASE GIVE US  A CALL AT [phone].

## 2023-06-14 LAB — HEMOGLOBIN A1C
Est. average glucose Bld gHb Est-mCnc: 126 mg/dL
Hgb A1c MFr Bld: 6 % — ABNORMAL HIGH (ref 4.8–5.6)

## 2023-06-14 LAB — BMP8+EGFR
BUN/Creatinine Ratio: 21 (ref 12–28)
BUN: 15 mg/dL (ref 8–27)
CO2: 22 mmol/L (ref 20–29)
Calcium: 9.8 mg/dL (ref 8.7–10.3)
Chloride: 103 mmol/L (ref 96–106)
Creatinine, Ser: 0.72 mg/dL (ref 0.57–1.00)
Glucose: 80 mg/dL (ref 70–99)
Potassium: 4.3 mmol/L (ref 3.5–5.2)
Sodium: 140 mmol/L (ref 134–144)
eGFR: 96 mL/min/{1.73_m2} (ref >59)

## 2023-06-14 LAB — CBC
Hematocrit: 37.9 % (ref 34.0–46.6)
Hemoglobin: 11.9 g/dL (ref 11.1–15.9)
MCH: 28.5 pg (ref 26.6–33.0)
MCHC: 31.4 g/dL — ABNORMAL LOW (ref 31.5–35.7)
MCV: 91 fL (ref 79–97)
Platelets: 264 10*3/uL (ref 150–450)
RBC: 4.17 x10E6/uL (ref 3.77–5.28)
RDW: 13.8 % (ref 11.7–15.4)
WBC: 4.9 10*3/uL (ref 3.4–10.8)

## 2023-06-14 LAB — LIPID PANEL
Chol/HDL Ratio: 3.3 ratio (ref 0.0–4.4)
Cholesterol, Total: 166 mg/dL (ref 100–199)
HDL: 50 mg/dL (ref >39)
LDL Chol Calc (NIH): 104 mg/dL — ABNORMAL HIGH (ref 0–99)
Triglycerides: 60 mg/dL (ref 0–149)
VLDL Cholesterol Cal: 12 mg/dL (ref 5–40)

## 2023-06-19 ENCOUNTER — Ambulatory Visit: Payer: Self-pay | Admitting: Family Medicine

## 2023-06-19 DIAGNOSIS — K219 Gastro-esophageal reflux disease without esophagitis: Secondary | ICD-10-CM | POA: Insufficient documentation

## 2023-06-19 NOTE — Assessment & Plan Note (Signed)
 Lab Results  Component Value Date   HGBA1C 6.0 (H) 06/13/2023   HGBA1C 6.4 (H) 03/07/2023   HGBA1C 5.9 10/13/2021   Continue current treatment regimen

## 2023-06-19 NOTE — Assessment & Plan Note (Signed)
 Continue nexium  40 mg every day , avoid foods that trigger acid build up.

## 2023-06-19 NOTE — Assessment & Plan Note (Signed)
 BP fairly controlled. Continue Norvasc  10 mg once daily, HCTZ 25 mg once daily

## 2023-06-19 NOTE — Progress Notes (Signed)
 A1c is great at 6.0, cholesterol went up at 104 but still well controlled. Keep up the good work

## 2023-06-19 NOTE — Assessment & Plan Note (Signed)
 She is encouraged to strive for BMI less than 30 to decrease cardiac risk. Advised to aim for at least 150 minutes of exercise per week.

## 2023-06-19 NOTE — Assessment & Plan Note (Signed)
 The current medical regimen is effective;  continue present plan and medications. Rosuvastain 20 mg every day

## 2023-08-12 ENCOUNTER — Other Ambulatory Visit: Payer: Self-pay | Admitting: Family Medicine

## 2023-08-12 ENCOUNTER — Encounter: Payer: Self-pay | Admitting: Family Medicine

## 2023-08-12 DIAGNOSIS — Z1231 Encounter for screening mammogram for malignant neoplasm of breast: Secondary | ICD-10-CM

## 2023-08-22 ENCOUNTER — Ambulatory Visit (INDEPENDENT_AMBULATORY_CARE_PROVIDER_SITE_OTHER): Payer: Self-pay | Admitting: Family Medicine

## 2023-08-22 ENCOUNTER — Encounter: Payer: Self-pay | Admitting: Family Medicine

## 2023-08-22 VITALS — BP 110/60 | HR 81 | Temp 98.2°F | Wt 237.0 lb

## 2023-08-22 DIAGNOSIS — Z6838 Body mass index (BMI) 38.0-38.9, adult: Secondary | ICD-10-CM

## 2023-08-22 DIAGNOSIS — E1159 Type 2 diabetes mellitus with other circulatory complications: Secondary | ICD-10-CM

## 2023-08-22 DIAGNOSIS — E785 Hyperlipidemia, unspecified: Secondary | ICD-10-CM | POA: Diagnosis not present

## 2023-08-22 DIAGNOSIS — E1169 Type 2 diabetes mellitus with other specified complication: Secondary | ICD-10-CM

## 2023-08-22 DIAGNOSIS — G4700 Insomnia, unspecified: Secondary | ICD-10-CM

## 2023-08-22 DIAGNOSIS — I152 Hypertension secondary to endocrine disorders: Secondary | ICD-10-CM | POA: Diagnosis not present

## 2023-08-22 DIAGNOSIS — E66812 Obesity, class 2: Secondary | ICD-10-CM

## 2023-08-22 DIAGNOSIS — F4329 Adjustment disorder with other symptoms: Secondary | ICD-10-CM

## 2023-08-22 MED ORDER — ZOLPIDEM TARTRATE 5 MG PO TABS: 5.0000 mg | ORAL_TABLET | Freq: Every evening | ORAL | 0 refills | Status: AC | PRN

## 2023-08-22 NOTE — Patient Instructions (Signed)

## 2023-08-22 NOTE — Progress Notes (Signed)
 I,[name], CMA,acting as a Neurosurgeon for [employer], NP.,have documented all relevant documentation on the behalf of Brianna Creighton, NP,as directed by  Brianna Creighton, NP while in the presence of Brianna Creighton, NP.  Subjective:  Patient ID: Brianna Hawkins , female    DOB: [birth year] , 63 y.o.   MRN: [id number]  Chief Complaint  Patient presents with   Diabetes    Patient is a 63 year old female who is is the office today for chronic disease management.she has a history of chronic GERD, diabetes hypertension and hyperlipidemia.  She she has no complaints or concerns today   Hypertension   Insomnia    Patient would like to have something for sleep. She has been unable to sleep since the passing of her husband in July.     HPI Discussed the use of AI scribe software for clinical note transcription with the patient, who gave verbal consent to proceed.  History of Present Illness     Brianna Hawkins is a 63 year old female who presents with insomnia and stress following her husband's recent passing.  She has been experiencing significant difficulty sleeping since her husband's passing on July 4th due to COPD. She has tried over-the-counter Z-Quil without success and is concerned about the potential for addiction with Ambien .  She describes pain in her side that sometimes radiates up her back toward the shoulder blade. She suspects it may be due to a pulled muscle from an incident at work a couple of months ago when she twisted her left ankle while holding a forty-pound box. She has been taking Tylenol  and ibuprofen , which sometimes help alleviate the pain.  She is currently taking metformin  twice a day for prediabetes, and her blood sugar was 127 mg/dL this morning before eating. She also takes propranolol  daily, prescribed by her cardiologist, and continues with Nuvessa  and hydrochlorothiazide  for hypertension. She regularly monitors her blood pressure and blood sugar at home.  She  mentions a significant increase in her monthly expenses following her husband's death, as she is now solely responsible for the bills. She has been trying to keep busy by returning to work and spending time with family to cope with her grief and loneliness. She has a history of substance abuse but denies any recent temptation.  She is encouraged to strive for BMI less than 30 to decrease cardiac risk. Advised to aim for at least 150 minutes of exercise per week.       Past Medical History:  Diagnosis Date   Coronary artery calcification seen on CT scan    cardiologist-- dr calhoun; Coronary CTA 9/19: Ca score = 1 (80th percentile); calcified plaque distal LM; no obstructive CAD.   DM2 (diabetes mellitus, type 2) (HCC)    followed by pcp-- (09-29-2020 pt stated check blood sugar twice wkly in am,  fasting sugar--- unsure per pt)   DOE (dyspnea on exertion)    09-29-2020  per pt sob w/ any activity , especially stairs, stated uses rescue inhaler and recovers quickly   Endometrial polyp    Hysteroscopy D & C 10/22   GERD (gastroesophageal reflux disease)    H/O: substance abuse (HCC)    per pt last used crack cocaine in 1998   Heart murmur    last echo in epic 03-26-2019, ef 50-55%, trivial TR   History of syncope 2014   vasovagal   History of treatment for tuberculosis 1998   per pt treated in 1998;  pt had bronchoscopy w/ bx's RUL 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   HLD (hyperlipidemia)    Hypertension    Migraine    OA (osteoarthritis)    PMB (postmenopausal bleeding)    Postmenopausal bleeding 07/01/2020   Tuberculosis 01/02/1996   7 YEARS AGO   Urinary urgency    Vertigo      Family History  Problem Relation Age of Onset   Alcohol abuse Mother    Diabetes Mother    Heart disease Mother    Cancer Mother    Esophageal cancer Father    Rectal cancer Father    Throat cancer Father        was a smoker   Colon cancer Father    Alcohol abuse Father     Cancer Father 59       colon cancer   HIV Sister    Heart disease Sister 56       died age 37 Heart was weak   Stroke Sister    Stomach cancer Maternal Aunt    Diabetes Maternal Grandmother    Diabetes Paternal Grandmother    Dementia Paternal Grandmother    Hypertension Paternal Grandmother    Breast cancer Paternal Grandmother    Skin cancer Paternal Grandmother    Sudden Cardiac Death Neg Hx      Current Outpatient Medications:    AGAMATRIX ULTRA-THIN LANCETS MISC, Check sugars twice daily before meals, Disp: 100 each, Rfl: 11   albuterol  (VENTOLIN  HFA) 108 (90 Base) MCG/ACT inhaler, Inhale 2 puffs into the lungs every 6 (six) hours as needed for wheezing or shortness of breath., Disp: , Rfl:    amLODipine  (NORVASC ) 10 MG tablet, Take 1 tablet by mouth once daily, Disp: 90 tablet, Rfl: 0   aspirin  EC 81 MG tablet, Take 1 tablet (81 mg total) by mouth daily., Disp: , Rfl:    Blood Glucose Monitoring Suppl (AGAMATRIX PRESTO) w/Device KIT, Check sugars twice daily before meals, Disp: 1 kit, Rfl: 0   Cholecalciferol (VITAMIN D3) 50 MCG (2000 UT) TABS, Take 5,000 Units by mouth daily. , Disp: , Rfl:    diclofenac  (VOLTAREN ) 50 MG EC tablet, Take 1 tablet (50 mg total) by mouth 2 (two) times daily., Disp: 20 tablet, Rfl: 0   esomeprazole  (NEXIUM ) 40 MG capsule, Take 1 capsule (40 mg total) by mouth daily., Disp: 90 capsule, Rfl: 2   ezetimibe  (ZETIA ) 10 MG tablet, Take 1 tablet (10 mg total) by mouth daily., Disp: 90 tablet, Rfl: 1   fluticasone  (FLONASE ) 50 MCG/ACT nasal spray, Place 2 sprays into both nostrils daily., Disp: 16 g, Rfl: 3   glucose blood (AGAMATRIX PRESTO TEST) test strip, Check blood glucose twice daily before meals, Disp: 100 each, Rfl: 11   hydrochlorothiazide  (HYDRODIURIL ) 25 MG tablet, Take 1 tablet by mouth once daily, Disp: 90 tablet, Rfl: 0   ibuprofen  (ADVIL ) 800 MG tablet, Take 1 tablet (800 mg total) by mouth every 8 (eight) hours as needed., Disp: 30 tablet,  Rfl: 0   metFORMIN  (GLUCOPHAGE -XR) 500 MG 24 hr tablet, TAKE 1 TABLET BY MOUTH TWICE DAILY WITH A MEAL, Disp: 60 tablet, Rfl: 0   metFORMIN  (GLUCOPHAGE -XR) 500 MG 24 hr tablet, TAKE 1 TABLET BY MOUTH TWICE DAILY WITH A MEAL, Disp: 60 tablet, Rfl: 0   Multiple Vitamins-Minerals (HAIR/SKIN/NAILS) TABS, Take 1 tablet by mouth daily., Disp: , Rfl:    nitroGLYCERIN  (NITROSTAT ) 0.4 MG SL tablet, 1 tab under tongue for chest pressure,  may repeat every 5 minutes if not relieved for total of 3 doses., Disp: 24 tablet, Rfl: 3   ondansetron  (ZOFRAN ) 4 MG tablet, Take 1 tablet (4 mg total) by mouth every 8 (eight) hours as needed for nausea or vomiting., Disp: 6 tablet, Rfl: 0   propranolol  (INDERAL ) 10 MG tablet, Take 1 tablet (10 mg total) by mouth 3 (three) times daily., Disp: 120 tablet, Rfl: 4   rizatriptan  (MAXALT ) 10 MG tablet, Take 10 mg by mouth as needed for migraine. May repeat in 2 hours if needed, Disp: , Rfl:    rosuvastatin  (CRESTOR ) 20 MG tablet, Take 1 tablet (20 mg total) by mouth daily., Disp: 90 tablet, Rfl: 3   zolpidem  (AMBIEN ) 5 MG tablet, Take 1 tablet (5 mg total) by mouth at bedtime as needed for sleep., Disp: 30 tablet, Rfl: 0   No Known Allergies   Review of Systems  Constitutional: Negative.   Eyes: Negative.   Respiratory: Negative.    Cardiovascular: Negative.  Negative for chest pain, palpitations and leg swelling.  Endocrine: Negative for polydipsia, polyphagia and polyuria.  Musculoskeletal: Negative.   Skin: Negative.   Psychiatric/Behavioral:  Positive for sleep disturbance.      Today's Vitals   08/22/23 1048  BP: 110/60  Pulse: 81  Temp: 98.2 F (36.8 C)  TempSrc: Oral  Weight: 237 lb (107.5 kg)  PainSc: 7   PainLoc: Back   Body mass index is 38.25 kg/m.  Wt Readings from Last 3 Encounters:  08/22/23 237 lb (107.5 kg)  06/13/23 238 lb 3.2 oz (108 kg)  05/09/23 246 lb (111.6 kg)    The 10-year ASCVD risk score (Arnett DK, et al., 2019) is: 9.2%    Values used to calculate the score:     Age: 78 years     Clincally relevant sex: Female     Is Non-Hispanic African American: Yes     Diabetic: Yes     Tobacco smoker: No     Systolic Blood Pressure: 110 mmHg     Is BP treated: Yes     HDL Cholesterol: 50 mg/dL     Total Cholesterol: 166 mg/dL  Objective:  Physical Exam HENT:     Head: Normocephalic.  Cardiovascular:     Rate and Rhythm: Normal rate and regular rhythm.  Pulmonary:     Effort: Pulmonary effort is normal.     Breath sounds: Normal breath sounds.  Neurological:     General: No focal deficit present.     Mental Status: She is alert and oriented to person, place, and time.  Psychiatric:        Behavior: Behavior normal.         Assessment And Plan:  Hypertension associated with diabetes (HCC) Assessment & Plan: Chronic . Well controlled today. Continue current treatment   Hyperlipidemia associated with type 2 diabetes mellitus (HCC) Assessment & Plan: On statin therapy   Insomnia, unspecified type Assessment & Plan: Due to recent loss. Gave ambien  to use PRN temporarily  Orders: -     Zolpidem  Tartrate; Take 1 tablet (5 mg total) by mouth at bedtime as needed for sleep.  Dispense: 30 tablet; Refill: 0  Stress and adjustment reaction Assessment & Plan: Loss of spouse less than 6 months ago   Class 2 severe obesity due to excess calories with serious comorbidity and body mass index (BMI) of 38.0 to 38.9 in adult [facility]) Assessment & Plan: She is encouraged to strive for BMI less  than 30 to decrease cardiac risk. Advised to aim for at least 150 minutes of exercise per week.      Return for , physical.  Patient was given opportunity to ask questions. Patient verbalized understanding of the plan and was able to repeat key elements of the plan. All questions were answered to their satisfaction.  Discussed the use of AI scribe software for clinical note transcription with the patient, who gave verbal  consent to proceed.  History of Present Illness TANIQUA ISSA is a 63 year old female who presents with insomnia and stress following her husband's recent passing.  She has been experiencing significant difficulty sleeping since her husband's passing on July 4th due to COPD. She has tried over-the-counter Z-Quil without success and is concerned about the potential for addiction with Ambien .  She describes pain in her side that sometimes radiates up her back toward the shoulder blade. She suspects it may be due to a pulled muscle from an incident at work a couple of months ago when she twisted her left ankle while holding a forty-pound box. She has been taking Tylenol  and ibuprofen , which sometimes help alleviate the pain.  She is currently taking metformin  twice a day for prediabetes, and her blood sugar was 127 mg/dL this morning before eating. She also takes propranolol  daily, prescribed by her cardiologist, and continues with Nuvessa  and hydrochlorothiazide  for hypertension. She regularly monitors her blood pressure and blood sugar at home.  She mentions a significant increase in her monthly expenses following her husband's death, as she is now solely responsible for the bills. She has been trying to keep busy by returning to work and spending time with family to cope with her grief and loneliness. She has a history of substance abuse but denies any recent temptation.   I, Brianna Creighton, NP, have reviewed all documentation for this visit. The documentation on 09/11/23 for the exam, diagnosis, procedures, and orders are all accurate and complete.   IF YOU HAVE BEEN REFERRED TO A SPECIALIST, IT MAY TAKE 1-2 WEEKS TO SCHEDULE/PROCESS THE REFERRAL. IF YOU HAVE NOT HEARD FROM US /SPECIALIST IN TWO WEEKS, PLEASE GIVE US  A CALL AT [phone].

## 2023-08-27 ENCOUNTER — Ambulatory Visit
Admission: RE | Admit: 2023-08-27 | Discharge: 2023-08-27 | Disposition: A | Discharge status: Home / Self Care | Source: Ambulatory Visit | Attending: Family Medicine | Admitting: Family Medicine

## 2023-08-27 DIAGNOSIS — Z1231 Encounter for screening mammogram for malignant neoplasm of breast: Secondary | ICD-10-CM

## 2023-09-11 DIAGNOSIS — E66812 Obesity, class 2: Secondary | ICD-10-CM | POA: Insufficient documentation

## 2023-09-11 DIAGNOSIS — F4329 Adjustment disorder with other symptoms: Secondary | ICD-10-CM | POA: Insufficient documentation

## 2023-09-11 DIAGNOSIS — G4700 Insomnia, unspecified: Secondary | ICD-10-CM | POA: Insufficient documentation

## 2023-09-11 NOTE — Assessment & Plan Note (Signed)
 Loss of spouse less than 6 months ago

## 2023-09-11 NOTE — Assessment & Plan Note (Signed)
 Chronic . Well controlled today. Continue current treatment

## 2023-09-11 NOTE — Assessment & Plan Note (Signed)
 She is encouraged to strive for BMI less than 30 to decrease cardiac risk. Advised to aim for at least 150 minutes of exercise per week.

## 2023-09-11 NOTE — Assessment & Plan Note (Signed)
 On statin therapy

## 2023-09-11 NOTE — Assessment & Plan Note (Signed)
 Due to recent loss. Gave ambien  to use PRN temporarily

## 2023-09-19 ENCOUNTER — Ambulatory Visit: Admitting: Family Medicine

## 2023-09-23 DIAGNOSIS — M26609 Unspecified temporomandibular joint disorder, unspecified side: Secondary | ICD-10-CM | POA: Diagnosis not present

## 2023-09-23 DIAGNOSIS — T17208A Unspecified foreign body in pharynx causing other injury, initial encounter: Secondary | ICD-10-CM | POA: Diagnosis not present

## 2023-09-23 DIAGNOSIS — J019 Acute sinusitis, unspecified: Secondary | ICD-10-CM | POA: Diagnosis not present

## 2023-09-26 ENCOUNTER — Ambulatory Visit (INDEPENDENT_AMBULATORY_CARE_PROVIDER_SITE_OTHER): Admitting: Family Medicine

## 2023-09-26 ENCOUNTER — Encounter: Payer: Self-pay | Admitting: Family Medicine

## 2023-09-26 VITALS — BP 110/60 | HR 80 | Temp 98.4°F | Ht 66.0 in | Wt 236.0 lb

## 2023-09-26 DIAGNOSIS — E1159 Type 2 diabetes mellitus with other circulatory complications: Secondary | ICD-10-CM | POA: Diagnosis not present

## 2023-09-26 DIAGNOSIS — Z6838 Body mass index (BMI) 38.0-38.9, adult: Secondary | ICD-10-CM

## 2023-09-26 DIAGNOSIS — E1165 Type 2 diabetes mellitus with hyperglycemia: Secondary | ICD-10-CM | POA: Diagnosis not present

## 2023-09-26 DIAGNOSIS — E1169 Type 2 diabetes mellitus with other specified complication: Secondary | ICD-10-CM

## 2023-09-26 DIAGNOSIS — R252 Cramp and spasm: Secondary | ICD-10-CM

## 2023-09-26 DIAGNOSIS — E66812 Obesity, class 2: Secondary | ICD-10-CM | POA: Diagnosis not present

## 2023-09-26 DIAGNOSIS — Z0000 Encounter for general adult medical examination without abnormal findings: Secondary | ICD-10-CM | POA: Diagnosis not present

## 2023-09-26 DIAGNOSIS — E785 Hyperlipidemia, unspecified: Secondary | ICD-10-CM | POA: Diagnosis not present

## 2023-09-26 DIAGNOSIS — I152 Hypertension secondary to endocrine disorders: Secondary | ICD-10-CM | POA: Diagnosis not present

## 2023-09-26 NOTE — Patient Instructions (Signed)

## 2023-09-26 NOTE — Progress Notes (Unsigned)
 I,Brianna Hawkins, CMA,acting as a Neurosurgeon for [employer], NP.,have documented all relevant documentation on the behalf of Brianna Creighton, NP,as directed by  Brianna Creighton, NP while in the presence of Brianna Creighton, NP.  Subjective:  Patient ID: Brianna Hawkins , female    DOB: Jul 16, 1960 , 63 y.o.   MRN: [id number]  Chief Complaint  Patient presents with  . Diabetes    Patient presents today for a dm check. Patient reports compliance with her meds. Patient denies having chest pain,sob or headaches at this time.   . Hypertension    HPI Discussed the use of AI scribe software for clinical note transcription with the patient, who gave verbal consent to proceed.  History of Present Illness Brianna Hawkins is a 63 year old female with diabetes who presents with throat pain and jaw discomfort.  She began experiencing throat pain last Wednesday, described as a needle-like sensation on the right side of her throat, particularly with swallowing. Home remedies provided no relief, and by Friday, she was unable to open her jaw or eat, prompting an emergency visit to an ENT specialist on Monday.  The ENT specialist performed a nasal endoscopy, which showed no abnormalities. She was prescribed amoxicillin  and prednisone  20 mg once daily for five days. Since starting the medication, her swallowing has improved, but she continues to experience jaw pain. She is now able to eat, which she could not do on Friday.  She has not checked her blood sugar recently and did not take her diabetes medication this morning because she needs to eat with it. She takes a multivitamin, Centrum Silver 50 plus, and her cholesterol medications, which include Crestor  and Zetia . She confirms having enough medication and does not require refills at this time.  No swelling in ankles and regular bowel movements.     Diabetes She presents for her follow-up diabetic visit. She has type 2 diabetes mellitus. No MedicAlert  identification noted. Her disease course has been improving. Hypoglycemia symptoms include headaches and hunger. Pertinent negatives for diabetes include no polydipsia, no polyphagia and no polyuria. Symptoms are improving. Risk factors for coronary artery disease include diabetes mellitus, dyslipidemia, hypertension, obesity, post-menopausal and stress. She is compliant with treatment most of the time. Her weight is stable. She is following a generally healthy diet. Meal planning includes avoidance of concentrated sweets. She has not had a previous visit with a dietitian. She participates in exercise intermittently. Her home blood glucose trend is decreasing steadily. Her breakfast blood glucose is taken between 9-10 am. Her breakfast blood glucose range is generally 110-130 mg/dl. She does not see a podiatrist.Eye exam is current.     Past Medical History:  Diagnosis Date  . Coronary artery calcification seen on CT scan    cardiologist-- Brianna calhoun; Coronary CTA 9/19: Ca score = 1 (80th percentile); calcified plaque distal LM; no obstructive CAD.  SABRA DM2 (diabetes mellitus, type 2) (HCC)    followed by pcp-- (09-29-2020 pt stated check blood sugar twice wkly in am,  fasting sugar--- unsure per pt)  . DOE (dyspnea on exertion)    09-29-2020  per pt sob w/ any activity , especially stairs, stated uses rescue inhaler and recovers quickly  . Endometrial polyp    Hysteroscopy D & C 10/22  . GERD (gastroesophageal reflux disease)   . H/O: substance abuse (HCC)    per pt last used crack cocaine in 1998  . Heart murmur    last echo in epic  03-26-2019, ef 50-55%, trivial TR  . History of syncope 2014   vasovagal  . History of treatment for tuberculosis 1998   per pt treated in 1998;  pt had bronchoscopy w/ bx's RUL 10-11-2005 by Brianna Hawkins showed mycobacterium tb complex  . HLD (hyperlipidemia)   . Hypertension   . Migraine   . OA (osteoarthritis)   . PMB (postmenopausal bleeding)   . Postmenopausal  bleeding 07/01/2020  . Tuberculosis 01/02/1996   7 YEARS AGO  . Urinary urgency   . Vertigo      Family History  Problem Relation Age of Onset  . Alcohol abuse Mother   . Diabetes Mother   . Heart disease Mother   . Cancer Mother   . Esophageal cancer Father   . Rectal cancer Father   . Throat cancer Father        was a smoker  . Colon cancer Father   . Alcohol abuse Father   . Cancer Father 57       colon cancer  . HIV Sister   . Heart disease Sister 53       died age 66 Heart was weak  . Stroke Sister   . Stomach cancer Maternal Aunt   . Diabetes Maternal Grandmother   . Diabetes Paternal Grandmother   . Dementia Paternal Grandmother   . Hypertension Paternal Grandmother   . Breast cancer Paternal Grandmother   . Skin cancer Paternal Grandmother   . Sudden Cardiac Death Neg Hx      Current Outpatient Medications:  .  AGAMATRIX ULTRA-THIN LANCETS MISC, Check sugars twice daily before meals, Disp: 100 each, Rfl: 11 .  albuterol  (VENTOLIN  HFA) 108 (90 Base) MCG/ACT inhaler, Inhale 2 puffs into the lungs every 6 (six) hours as needed for wheezing or shortness of breath., Disp: , Rfl:  .  amLODipine  (NORVASC ) 10 MG tablet, Take 1 tablet by mouth once daily, Disp: 90 tablet, Rfl: 0 .  amoxicillin  (AMOXIL ) 875 MG tablet, Take 875 mg by mouth 2 (two) times daily., Disp: , Rfl:  .  aspirin  EC 81 MG tablet, Take 1 tablet (81 mg total) by mouth daily., Disp: , Rfl:  .  Blood Glucose Monitoring Suppl (AGAMATRIX PRESTO) w/Device KIT, Check sugars twice daily before meals, Disp: 1 kit, Rfl: 0 .  Cholecalciferol (VITAMIN D3) 50 MCG (2000 UT) TABS, Take 5,000 Units by mouth daily. , Disp: , Rfl:  .  diclofenac  (VOLTAREN ) 50 MG EC tablet, Take 1 tablet (50 mg total) by mouth 2 (two) times daily., Disp: 20 tablet, Rfl: 0 .  esomeprazole  (NEXIUM ) 40 MG capsule, Take 1 capsule (40 mg total) by mouth daily., Disp: 90 capsule, Rfl: 2 .  ezetimibe  (ZETIA ) 10 MG tablet, Take 1 tablet (10 mg  total) by mouth daily., Disp: 90 tablet, Rfl: 1 .  fluticasone  (FLONASE ) 50 MCG/ACT nasal spray, Place 2 sprays into both nostrils daily., Disp: 16 g, Rfl: 3 .  glucose blood (AGAMATRIX PRESTO TEST) test strip, Check blood glucose twice daily before meals, Disp: 100 each, Rfl: 11 .  hydrochlorothiazide  (HYDRODIURIL ) 25 MG tablet, Take 1 tablet by mouth once daily, Disp: 90 tablet, Rfl: 0 .  ibuprofen  (ADVIL ) 800 MG tablet, Take 1 tablet (800 mg total) by mouth every 8 (eight) hours as needed., Disp: 30 tablet, Rfl: 0 .  metFORMIN  (GLUCOPHAGE -XR) 500 MG 24 hr tablet, TAKE 1 TABLET BY MOUTH TWICE DAILY WITH A MEAL, Disp: 60 tablet, Rfl: 0 .  metFORMIN  (GLUCOPHAGE -XR) 500  MG 24 hr tablet, TAKE 1 TABLET BY MOUTH TWICE DAILY WITH A MEAL, Disp: 60 tablet, Rfl: 0 .  Multiple Vitamins-Minerals (HAIR/SKIN/NAILS) TABS, Take 1 tablet by mouth daily., Disp: , Rfl:  .  nitroGLYCERIN  (NITROSTAT ) 0.4 MG SL tablet, 1 tab under tongue for chest pressure, may repeat every 5 minutes if not relieved for total of 3 doses., Disp: 24 tablet, Rfl: 3 .  ondansetron  (ZOFRAN ) 4 MG tablet, Take 1 tablet (4 mg total) by mouth every 8 (eight) hours as needed for nausea or vomiting., Disp: 6 tablet, Rfl: 0 .  predniSONE  (DELTASONE ) 20 MG tablet, Take 20 mg by mouth daily with breakfast., Disp: , Rfl:  .  propranolol  (INDERAL ) 10 MG tablet, Take 1 tablet (10 mg total) by mouth 3 (three) times daily., Disp: 120 tablet, Rfl: 4 .  rizatriptan  (MAXALT ) 10 MG tablet, Take 10 mg by mouth as needed for migraine. May repeat in 2 hours if needed, Disp: , Rfl:  .  rosuvastatin  (CRESTOR ) 20 MG tablet, Take 1 tablet (20 mg total) by mouth daily., Disp: 90 tablet, Rfl: 3 .  zolpidem  (AMBIEN ) 5 MG tablet, Take 1 tablet (5 mg total) by mouth at bedtime as needed for sleep., Disp: 30 tablet, Rfl: 0   No Known Allergies   Review of Systems  Constitutional: Negative.   Eyes: Negative.   Respiratory: Negative.    Cardiovascular: Negative.    Endocrine: Negative for polydipsia, polyphagia and polyuria.  Neurological:  Positive for headaches.     Today's Vitals   09/26/23 1128  BP: 110/60  Pulse: 80  Temp: 98.4 F (36.9 C)  TempSrc: Oral  Weight: 236 lb (107 kg)  Height: 5' 6 (1.676 m)  PainSc: 8   PainLoc: Back   Body mass index is 38.09 kg/m.  Wt Readings from Last 3 Encounters:  09/26/23 236 lb (107 kg)  08/22/23 237 lb (107.5 kg)  06/13/23 238 lb 3.2 oz (108 kg)    The 10-year ASCVD risk score (Arnett DK, et al., 2019) is: 9.2%   Values used to calculate the score:     Age: 78 years     Clincally relevant sex: Female     Is Non-Hispanic African American: Yes     Diabetic: Yes     Tobacco smoker: No     Systolic Blood Pressure: 110 mmHg     Is BP treated: Yes     HDL Cholesterol: 50 mg/dL     Total Cholesterol: 166 mg/dL  Objective:  Physical Exam Constitutional:      Appearance: Normal appearance.  HENT:     Head: Normocephalic.  Cardiovascular:     Rate and Rhythm: Normal rate and regular rhythm.     Pulses: Normal pulses.     Heart sounds: Normal heart sounds.  Pulmonary:     Effort: Pulmonary effort is normal.     Breath sounds: Normal breath sounds.  Abdominal:     General: Bowel sounds are normal.  Musculoskeletal:        General: Normal range of motion.  Skin:    General: Skin is warm and dry.  Neurological:     General: No focal deficit present.     Mental Status: She is alert and oriented to person, place, and time. Mental status is at baseline.  Psychiatric:        Mood and Affect: Mood normal.         Assessment And Plan:  Hypertension associated with diabetes (HCC) -  CBC -     CMP14+EGFR  Hyperlipidemia associated with type 2 diabetes mellitus (HCC) -     Lipid panel  Type 2 diabetes mellitus with hyperglycemia, without long-term current use of insulin (HCC) -     Hemoglobin A1c  Class 2 severe obesity due to excess calories with serious comorbidity and body  mass index (BMI) of 38.0 to 38.9 in adult    Return in about 4 months (around 01/26/2024) for bp and dm check.  Patient was given opportunity to ask questions. Patient verbalized understanding of the plan and was able to repeat key elements of the plan. All questions were answered to their satisfaction.    I, Brianna Creighton, NP, have reviewed all documentation for this visit. The documentation on 09/26/23 for the exam, diagnosis, procedures, and orders are all accurate and complete.   IF YOU HAVE BEEN REFERRED TO A SPECIALIST, IT MAY TAKE 1-2 WEEKS TO SCHEDULE/PROCESS THE REFERRAL. IF YOU HAVE NOT HEARD FROM US /SPECIALIST IN TWO WEEKS, PLEASE GIVE US  A CALL AT [phone].

## 2023-09-27 LAB — LIPID PANEL
Chol/HDL Ratio: 2.9 ratio (ref 0.0–4.4)
Cholesterol, Total: 152 mg/dL (ref 100–199)
HDL: 52 mg/dL (ref >39)
LDL Chol Calc (NIH): 88 mg/dL (ref 0–99)
Triglycerides: 61 mg/dL (ref 0–149)
VLDL Cholesterol Cal: 12 mg/dL (ref 5–40)

## 2023-09-27 LAB — CMP14+EGFR
ALT: 18 IU/L (ref 0–32)
AST: 20 IU/L (ref 0–40)
Albumin: 4.5 g/dL (ref 3.8–4.9)
Alkaline Phosphatase: 121 IU/L (ref 49–135)
BUN/Creatinine Ratio: 25 (ref 12–28)
BUN: 17 mg/dL (ref 8–27)
Bilirubin Total: 0.4 mg/dL (ref 0.0–1.2)
CO2: 24 mmol/L (ref 20–29)
Calcium: 9.6 mg/dL (ref 8.7–10.3)
Chloride: 98 mmol/L (ref 96–106)
Creatinine, Ser: 0.67 mg/dL (ref 0.57–1.00)
Globulin, Total: 2.8 g/dL (ref 1.5–4.5)
Glucose: 93 mg/dL (ref 70–99)
Potassium: 3.8 mmol/L (ref 3.5–5.2)
Sodium: 139 mmol/L (ref 134–144)
Total Protein: 7.3 g/dL (ref 6.0–8.5)
eGFR: 100 mL/min/1.73 (ref >59)

## 2023-09-27 LAB — CBC
Hematocrit: 37.9 % (ref 34.0–46.6)
Hemoglobin: 11.7 g/dL (ref 11.1–15.9)
MCH: 28.5 pg (ref 26.6–33.0)
MCHC: 30.9 g/dL — ABNORMAL LOW (ref 31.5–35.7)
MCV: 92 fL (ref 79–97)
Platelets: 293 x10E3/uL (ref 150–450)
RBC: 4.11 x10E6/uL (ref 3.77–5.28)
RDW: 13.8 % (ref 11.7–15.4)
WBC: 6.4 x10E3/uL (ref 3.4–10.8)

## 2023-09-27 LAB — HEMOGLOBIN A1C
Est. average glucose Bld gHb Est-mCnc: 131 mg/dL
Hgb A1c MFr Bld: 6.2 % — ABNORMAL HIGH (ref 4.8–5.6)

## 2023-10-01 DIAGNOSIS — J019 Acute sinusitis, unspecified: Secondary | ICD-10-CM | POA: Diagnosis not present

## 2023-10-01 DIAGNOSIS — M26601 Right temporomandibular joint disorder, unspecified: Secondary | ICD-10-CM | POA: Diagnosis not present

## 2023-10-08 DIAGNOSIS — J342 Deviated nasal septum: Secondary | ICD-10-CM | POA: Diagnosis not present

## 2023-10-08 DIAGNOSIS — J019 Acute sinusitis, unspecified: Secondary | ICD-10-CM | POA: Diagnosis not present

## 2023-10-09 DIAGNOSIS — Z0000 Encounter for general adult medical examination without abnormal findings: Secondary | ICD-10-CM | POA: Insufficient documentation

## 2023-10-10 ENCOUNTER — Ambulatory Visit: Payer: Self-pay | Admitting: Family Medicine

## 2023-10-10 DIAGNOSIS — R252 Cramp and spasm: Secondary | ICD-10-CM | POA: Insufficient documentation

## 2023-10-10 NOTE — Assessment & Plan Note (Signed)
 Hyperlipidemia managed with rosuvastatin  and ezetimibe .

## 2023-10-10 NOTE — Assessment & Plan Note (Signed)
 Chronic. Stable. Continue current treatment.

## 2023-10-10 NOTE — Assessment & Plan Note (Signed)
 She is encouraged to strive for BMI less than 30 to decrease cardiac risk. Advised to aim for at least 150 minutes of exercise per week.

## 2023-10-10 NOTE — Assessment & Plan Note (Signed)
 Type 2 diabetes mellitus managed with metformin . Recent initiation of prednisone  may affect blood glucose levels. A1c to be checked today, though recent prednisone  use may not yet reflect in A1c levels. - Check A1c today.

## 2023-10-10 NOTE — Assessment & Plan Note (Signed)
 Recent episode of acute oropharyngeal pain and trismus, initially severe, now improving.  ENT evaluation showed no significant findings. Treatment with amoxicillin  and prednisone  initiated.  Swallowing improved, jaw pain persists. - Follow-up with ENT on Tuesday.

## 2023-10-10 NOTE — Progress Notes (Signed)
 Labs are stable, kidney function, electrolytes and cholesterol are normal.A1c level went up to 6.2 but still less than 7. Continue to take all meds as prescribed and follow a carb diet as exercise too.   Thanks!

## 2023-10-17 ENCOUNTER — Ambulatory Visit: Admitting: Family Medicine

## 2023-11-04 ENCOUNTER — Encounter: Payer: Self-pay | Admitting: Radiology

## 2023-11-17 DIAGNOSIS — Z7982 Long term (current) use of aspirin: Secondary | ICD-10-CM | POA: Diagnosis not present

## 2023-11-17 DIAGNOSIS — Z79899 Other long term (current) drug therapy: Secondary | ICD-10-CM | POA: Diagnosis not present

## 2023-11-17 DIAGNOSIS — Z791 Long term (current) use of non-steroidal anti-inflammatories (NSAID): Secondary | ICD-10-CM | POA: Diagnosis not present

## 2023-11-17 DIAGNOSIS — J309 Allergic rhinitis, unspecified: Secondary | ICD-10-CM | POA: Diagnosis not present

## 2023-11-17 DIAGNOSIS — Z833 Family history of diabetes mellitus: Secondary | ICD-10-CM | POA: Diagnosis not present

## 2023-11-17 DIAGNOSIS — I25119 Atherosclerotic heart disease of native coronary artery with unspecified angina pectoris: Secondary | ICD-10-CM | POA: Diagnosis not present

## 2023-11-17 DIAGNOSIS — E785 Hyperlipidemia, unspecified: Secondary | ICD-10-CM | POA: Diagnosis not present

## 2023-11-17 DIAGNOSIS — N181 Chronic kidney disease, stage 1: Secondary | ICD-10-CM | POA: Diagnosis not present

## 2023-11-17 DIAGNOSIS — Z6837 Body mass index (BMI) 37.0-37.9, adult: Secondary | ICD-10-CM | POA: Diagnosis not present

## 2023-11-17 DIAGNOSIS — M1990 Unspecified osteoarthritis, unspecified site: Secondary | ICD-10-CM | POA: Diagnosis not present

## 2023-11-17 DIAGNOSIS — Z8249 Family history of ischemic heart disease and other diseases of the circulatory system: Secondary | ICD-10-CM | POA: Diagnosis not present

## 2023-11-17 DIAGNOSIS — I129 Hypertensive chronic kidney disease with stage 1 through stage 4 chronic kidney disease, or unspecified chronic kidney disease: Secondary | ICD-10-CM | POA: Diagnosis not present

## 2023-11-17 DIAGNOSIS — K219 Gastro-esophageal reflux disease without esophagitis: Secondary | ICD-10-CM | POA: Diagnosis not present

## 2023-11-17 DIAGNOSIS — J449 Chronic obstructive pulmonary disease, unspecified: Secondary | ICD-10-CM | POA: Diagnosis not present

## 2023-11-17 DIAGNOSIS — G4700 Insomnia, unspecified: Secondary | ICD-10-CM | POA: Diagnosis not present

## 2023-11-17 DIAGNOSIS — Z823 Family history of stroke: Secondary | ICD-10-CM | POA: Diagnosis not present

## 2023-11-17 DIAGNOSIS — E1122 Type 2 diabetes mellitus with diabetic chronic kidney disease: Secondary | ICD-10-CM | POA: Diagnosis not present

## 2024-01-09 ENCOUNTER — Encounter: Payer: Self-pay | Admitting: Family Medicine

## 2024-01-09 ENCOUNTER — Ambulatory Visit: Attending: Internal Medicine | Admitting: Internal Medicine

## 2024-01-09 ENCOUNTER — Encounter: Payer: Self-pay | Admitting: Internal Medicine

## 2024-01-09 VITALS — BP 114/82 | HR 83 | Ht 66.0 in | Wt 232.7 lb

## 2024-01-09 DIAGNOSIS — I152 Hypertension secondary to endocrine disorders: Secondary | ICD-10-CM | POA: Diagnosis not present

## 2024-01-09 DIAGNOSIS — R0602 Shortness of breath: Secondary | ICD-10-CM | POA: Diagnosis not present

## 2024-01-09 DIAGNOSIS — E782 Mixed hyperlipidemia: Secondary | ICD-10-CM

## 2024-01-09 DIAGNOSIS — E1159 Type 2 diabetes mellitus with other circulatory complications: Secondary | ICD-10-CM | POA: Diagnosis not present

## 2024-01-09 DIAGNOSIS — I2510 Atherosclerotic heart disease of native coronary artery without angina pectoris: Secondary | ICD-10-CM | POA: Diagnosis not present

## 2024-01-09 LAB — BASIC METABOLIC PANEL WITH GFR
BUN/Creatinine Ratio: 16 (ref 12–28)
BUN: 11 mg/dL (ref 8–27)
CO2: 22 mmol/L (ref 20–29)
Calcium: 10.1 mg/dL (ref 8.7–10.3)
Chloride: 97 mmol/L (ref 96–106)
Creatinine, Ser: 0.68 mg/dL (ref 0.57–1.00)
Glucose: 88 mg/dL (ref 70–99)
Potassium: 4 mmol/L (ref 3.5–5.2)
Sodium: 136 mmol/L (ref 134–144)
eGFR: 99 mL/min/1.73

## 2024-01-09 NOTE — Progress Notes (Signed)
 " Cardiology Office Note:  .   Date:  01/09/2024  ID:  Brianna Hawkins, DOB [birth year], MRN [id number] PCP: Petrina Pries, NP  [facility] Providers Cardiologist:  Emeline FORBES Calender, DO Cardiology APP:  Lelon Glendia DASEN, PA-C    History of Present Illness: .    Brianna Hawkins is a 64 y.o. female Who is a former patient of Dr. Alveta with a past medical history of hypertension, hyperlipidemia, chest pain and family history of CAD who was seen in the ED in February 2025 with chest pain and a calcium  score of 43 and started on propranolol  in March 2025 who presents today for follow-up.  Discussed the use of AI scribe software for clinical note transcription with the patient, who gave verbal consent to proceed.  History of Present Illness Brianna Hawkins is a 64 year old female with hypertension and hyperlipidemia who presents for a follow-up regarding her cardiovascular health.  She has a history of noncardiac and likely anxiety related chest pain, which was previously evaluated. She was started on propranolol  in March 2025 for episodes of chest discomfort but is no longer taking it. No current chest pain is reported.  She is concerned about her cardiovascular health due to a family history of coronary artery disease and her sister's early death from a heart issue at age 36. She had a calcium  score of 43 in January 2024 and is interested in checking her arteries for any blockages.  She experiences shortness of breath and fatigue, stating she gets tired quickly, even with minimal exertion, such as walking to the bathroom. This symptom has been present for some time and she does not feel it is worsening. No chest discomfort is associated with her shortness of breath, but she mentions soreness on the side of her breast where she has lumps that have been evaluated and are not cancerous. No swelling in her legs, nausea, or vomiting.  She is on several medications including amlodipine  10 mg for  blood pressure, aspirin  81 mg, ezetimibe  10 mg, and rosuvastatin  20 mg for cholesterol management. She also takes hydrochlorothiazide  25 mg.  She reports dietary changes, trying to eat healthier, and inquires about the use of corn oil versus olive oil for cooking. She uses corn oil for frying. She drinks alcohol occasionally, about one glass of wine per week, and does not smoke.      ROS: Remaining review of systems negative  Studies Reviewed: SABRA   EKG Interpretation Date/Time:  Thursday January 09 2024 13:32:25 EST Ventricular Rate:  83 PR Interval:  170 QRS Duration:  82 QT Interval:  370 QTC Calculation: 434 R Axis:   32  Text Interpretation: Normal sinus rhythm Biatrial enlargement When compared with ECG of 22-Feb-2023 21:24, No significant change was found Confirmed by Calender Emeline [phone]) on 01/09/2024 1:34:39 PM    Results Labs Lipid panel (09/26/2023): Total cholesterol 152, HDL 52, triglycerides 61, LDL 88 Hemoglobin A1c (09/26/2023): 6.2  Radiology Coronary artery calcium  score (01/25/2022): Calcium  score 43 Coronary CTA (09/20/2017): Calcium  score 1, no obstructive coronary artery disease  Diagnostic Echocardiogram (03/26/2019): Ejection fraction 50-55%, normal diastolic function EKG: Normal, unchanged from prior Risk Assessment/Calculations:             Physical Exam:   VS:  BP 114/82 (BP Location: Right Arm, Patient Position: Sitting, Cuff Size: Large)   Pulse 83   Ht 5' 6 (1.676 m)   Wt 232 lb 11.2 oz (105.6 kg)  LMP 12/11/2013   SpO2 97%   BMI 37.56 kg/m    Wt Readings from Last 3 Encounters:  01/09/24 232 lb 11.2 oz (105.6 kg)  09/26/23 236 lb (107 kg)  08/22/23 237 lb (107.5 kg)    GEN: Well nourished, well developed in no acute distress NECK: No JVD; No carotid bruits CARDIAC: RRR, no murmurs, no rubs, no gallops RESPIRATORY:  Clear to auscultation without rales, wheezing or rhonchi  ABDOMEN: Soft, non-tender, non-distended EXTREMITIES:  No  edema; No deformity   ASSESSMENT AND PLAN: .    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 No obstructive coronary artery disease on previous coronary CTA. Reports shortness of breath and fatigue, but no worsening symptoms. Family history of early cardiac death. - Ordered coronary CTA at the patient's request to reassess current calcium  score and check for any new blockages. - Instructed to take her home propranolol  two hours before the CT scan to lower heart rate for better imaging.  Dyspnea on exertion, possibly cardiac - CCTA as above  Hypertension Managed with amlodipine  and hydrochlorothiazide . - Continue current antihypertensive regimen.  Hyperlipidemia Managed with rosuvastatin  and ezetimibe . LDL cholesterol improved to 88 mg/dL in September 2024, but target is to lower LDL to 70 mg/dL. - Continue rosuvastatin  and ezetimibe . - Encouraged dietary modifications to further lower LDL cholesterol. - Management per PCP  Type 2 diabetes mellitus Managed with medication. Hemoglobin A1c was 6.2% in September 2024. - Continue current diabetes management regimen.            Follow up: 1 year  Signed, Emeline FORBES Calender, DO  01/09/2024 1:58 PM    [facility] "

## 2024-01-09 NOTE — Patient Instructions (Addendum)
 Medication Instructions:  Your physician recommends that you continue on your current medications as directed. Please refer to the Current Medication list given to you today.  *If you need a refill on your cardiac medications before your next appointment, please call your pharmacy*  Lab Work: BMET-TODAY If you have labs (blood work) drawn today and your tests are completely normal, you will receive your results only by: MyChart Message (if you have MyChart) OR A paper copy in the mail If you have any lab test that is abnormal or we need to change your treatment, we will call you to review the results.  Testing/Procedures:   Your cardiac CT will be scheduled at the below location:    [facility] [address]  [place], [place] [address]  If scheduled at the [facility] at [address], please enter the parking lot using the [address] entrance and use the FREE valet service at the patient drop-off area. Enter the building and check-in with registration on the main floor.  Please follow these instructions carefully (unless otherwise directed):  An IV will be required for this test and Nitroglycerin  will be given.   On the Night Before the Test: Be sure to Drink plenty of water. Do not consume any caffeinated/decaffeinated beverages or chocolate 12 hours prior to your test. Do not take any antihistamines 12 hours prior to your test.   On the Day of the Test: Drink plenty of water until 1 hour prior to the test. Do not eat any food 1 hour prior to test. You may take your regular medications prior to the test.  Take propranolol  two hours prior to test. If you take Furosemide/Hydrochlorothiazide /Spironolactone/Chlorthalidone, please HOLD on the morning of the test. Patients who wear a continuous glucose monitor MUST remove the device prior to scanning. FEMALES- please wear underwire-free bra if available, avoid dresses & tight  clothing  After the Test: Drink plenty of water. After receiving IV contrast, you may experience a mild flushed feeling. This is normal. On occasion, you may experience a mild rash up to 24 hours after the test. This is not dangerous. If this occurs, you can take Benadryl 25 mg, Zyrtec , Claritin, or Allegra and increase your fluid intake. (Patients taking Tikosyn should avoid Benadryl, and may take Zyrtec , Claritin, or Allegra) If you experience trouble breathing, this can be serious. If it is severe call 911 IMMEDIATELY. If it is mild, please call our office.  We will call to schedule your test 2-4 weeks out understanding that some insurance companies will need an authorization prior to the service being performed.   For more information and frequently asked questions, please visit our website : [email or web address]  For non-scheduling related questions, please contact the cardiac imaging nurse navigator should you have any questions/concerns: Cardiac Imaging Nurse Navigators Direct Office Dial: [phone]   For scheduling needs, including cancellations and rescheduling, please call Brittany, [phone].   Follow-Up: At [facility], you and your health needs are our priority.  As part of our continuing mission to provide you with exceptional heart care, our providers are all part of one team.  This team includes your primary Cardiologist (physician) and Advanced Practice Providers or APPs (Physician Assistants and Nurse Practitioners) who all work together to provide you with the care you need, when you need it.  Your next appointment:   1 year(s)  Provider:   Emeline FORBES Calender, DO

## 2024-01-10 ENCOUNTER — Ambulatory Visit: Admitting: Family Medicine

## 2024-01-10 ENCOUNTER — Ambulatory Visit: Payer: Self-pay | Admitting: Internal Medicine

## 2024-01-10 ENCOUNTER — Encounter: Payer: Self-pay | Admitting: Family Medicine

## 2024-01-10 VITALS — BP 118/78 | HR 92 | Temp 97.9°F | Wt 234.4 lb

## 2024-01-10 DIAGNOSIS — Z20828 Contact with and (suspected) exposure to other viral communicable diseases: Secondary | ICD-10-CM

## 2024-01-10 DIAGNOSIS — J069 Acute upper respiratory infection, unspecified: Secondary | ICD-10-CM

## 2024-01-10 LAB — POC COVID19/FLU A&B COMBO
Covid Antigen, POC: NEGATIVE
Influenza A Antigen, POC: NEGATIVE
Influenza B Antigen, POC: NEGATIVE

## 2024-01-10 MED ORDER — OSELTAMIVIR PHOSPHATE 75 MG PO CAPS: 75.0000 mg | ORAL_CAPSULE | Freq: Two times a day (BID) | ORAL | 0 refills | Status: AC

## 2024-01-10 NOTE — Progress Notes (Signed)
" ° °  Name: Brianna Hawkins   Date of Visit: 01/10/2024   Date of last visit with me: Visit date not found   CHIEF COMPLAINT:  Chief Complaint  Patient presents with   Acute Visit    Congestion, headache, hot, and foggy, body aches, runny nose. Symptoms started on Tuesday.        HPI:  Discussed the use of AI scribe software for clinical note transcription with the patient, who gave verbal consent to proceed.  History of Present Illness   Brianna Hawkins is a 64 year old female who presents with symptoms following recent vaccinations and exposure to influenza.  On Tuesday, she received four vaccinations: influenza, pneumonia, RSV, and shingles. That night, she felt very unwell, describing the experience as 'going through it really rough.'  On Wednesday, her grandson was diagnosed with influenza, and she took him to the hospital. She is concerned about her own exposure and the effectiveness of her influenza vaccination, as she started feeling worse after initially feeling better. Her influenza and COVID-19 tests were negative.  She works at [employer].         OBJECTIVE:       09/26/2023   11:28 AM  Depression screen PHQ 2/9  Decreased Interest 0  Down, Depressed, Hopeless 0  PHQ - 2 Score 0  Altered sleeping 0  Tired, decreased energy 0  Change in appetite 0  Feeling bad or failure about yourself  0  Trouble concentrating 0  Moving slowly or fidgety/restless 0  Suicidal thoughts 0  PHQ-9 Score 0   Difficult doing work/chores Not difficult at all     Data saved with a previous flowsheet row definition     BP Readings from Last 3 Encounters:  01/10/24 118/78  01/09/24 114/82  09/26/23 110/60    BP 118/78   Pulse 92   Temp 97.9 F (36.6 C)   Wt 234 lb 6.4 oz (106.3 kg)   LMP 12/11/2013   SpO2 96%   BMI 37.83 kg/m    Physical Exam          Physical Exam Constitutional:      Appearance: Normal appearance.  Neurological:     General:  No focal deficit present.     Mental Status: She is alert and oriented to person, place, and time. Mental status is at baseline.     ASSESSMENT/PLAN:   Assessment & Plan Viral URI  Exposure to the flu    Assessment and Plan    Viral URI Exposure to influenza Recent exposure to influenza. Symptoms suggest possible viral infection. Discussed prophylactic Tamiflu  use. No contraindications noted. - Prescribed Tamiflu  for five days, two pills daily. - Advised to stay home until Wednesday.         Tyion Boylen A. Vita MD [facility] "

## 2024-01-16 ENCOUNTER — Ambulatory Visit: Admitting: Surgical

## 2024-01-27 ENCOUNTER — Encounter (HOSPITAL_COMMUNITY): Payer: Self-pay

## 2024-01-28 ENCOUNTER — Encounter (HOSPITAL_COMMUNITY): Payer: Self-pay

## 2024-01-28 ENCOUNTER — Ambulatory Visit (HOSPITAL_COMMUNITY)
Admission: RE | Admit: 2024-01-28 | Discharge: 2024-01-28 | Disposition: A | Discharge status: Home / Self Care | Source: Ambulatory Visit

## 2024-01-28 DIAGNOSIS — I2510 Atherosclerotic heart disease of native coronary artery without angina pectoris: Secondary | ICD-10-CM | POA: Diagnosis not present

## 2024-01-28 DIAGNOSIS — R0602 Shortness of breath: Secondary | ICD-10-CM | POA: Diagnosis present

## 2024-01-28 MED ORDER — IOHEXOL 350 MG/ML SOLN
100.0000 mL | Freq: Once | INTRAVENOUS | Status: AC | PRN
  Administered 2024-01-28: 100 mL via INTRAVENOUS

## 2024-01-28 MED ORDER — NITROGLYCERIN 0.4 MG SL SUBL
0.8000 mg | SUBLINGUAL_TABLET | Freq: Once | SUBLINGUAL | Status: AC
  Administered 2024-01-28: 0.8 mg via SUBLINGUAL

## 2024-01-30 ENCOUNTER — Encounter: Payer: Self-pay | Admitting: Family Medicine

## 2024-01-30 ENCOUNTER — Ambulatory Visit: Payer: Self-pay | Admitting: Family Medicine

## 2024-01-30 VITALS — BP 112/80 | HR 83 | Temp 98.6°F | Ht 66.0 in | Wt 234.0 lb

## 2024-01-30 DIAGNOSIS — E1169 Type 2 diabetes mellitus with other specified complication: Secondary | ICD-10-CM

## 2024-01-30 DIAGNOSIS — K219 Gastro-esophageal reflux disease without esophagitis: Secondary | ICD-10-CM

## 2024-01-30 DIAGNOSIS — E119 Type 2 diabetes mellitus without complications: Secondary | ICD-10-CM

## 2024-01-30 DIAGNOSIS — I152 Hypertension secondary to endocrine disorders: Secondary | ICD-10-CM

## 2024-01-30 DIAGNOSIS — E1165 Type 2 diabetes mellitus with hyperglycemia: Secondary | ICD-10-CM

## 2024-01-30 MED ORDER — EMPAGLIFLOZIN 10 MG PO TABS: 10.0000 mg | ORAL_TABLET | Freq: Every day | ORAL | 1 refills | Status: AC

## 2024-01-30 MED ORDER — METFORMIN HCL ER 500 MG PO TB24: 500.0000 mg | ORAL_TABLET | Freq: Every day | ORAL | 1 refills | Status: AC

## 2024-01-30 MED ORDER — ESOMEPRAZOLE MAGNESIUM 40 MG PO CPDR: 40.0000 mg | DELAYED_RELEASE_CAPSULE | Freq: Every day | ORAL | 2 refills | Status: AC

## 2024-01-30 MED ORDER — METFORMIN HCL ER 500 MG PO TB24: 500.0000 mg | ORAL_TABLET | Freq: Two times a day (BID) | ORAL | 1 refills | Status: DC

## 2024-01-30 NOTE — Patient Instructions (Signed)
 Hypertension, Adult Hypertension is another name for high blood pressure. High blood pressure forces your heart to work harder to pump blood. This can cause problems over time. There are two numbers in a blood pressure reading. There is a top number (systolic) over a bottom number (diastolic). It is best to have a blood pressure that is below 120/80. What are the causes? The cause of this condition is not known. Some other conditions can lead to high blood pressure. What increases the risk? Some lifestyle factors can make you more likely to develop high blood pressure: Smoking. Not getting enough exercise or physical activity. Being overweight. Having too much fat, sugar, calories, or salt (sodium) in your diet. Drinking too much alcohol. Other risk factors include: Having any of these conditions: Heart disease. Diabetes. High cholesterol. Kidney disease. Obstructive sleep apnea. Having a family history of high blood pressure and high cholesterol. Age. The risk increases with age. Stress. What are the signs or symptoms? High blood pressure may not cause symptoms. Very high blood pressure (hypertensive crisis) may cause: Headache. Fast or uneven heartbeats (palpitations). Shortness of breath. Nosebleed. Vomiting or feeling like you may vomit (nauseous). Changes in how you see. Very bad chest pain. Feeling dizzy. Seizures. How is this treated? This condition is treated by making healthy lifestyle changes, such as: Eating healthy foods. Exercising more. Drinking less alcohol. Your doctor may prescribe medicine if lifestyle changes do not help enough and if: Your top number is above 130. Your bottom number is above 80. Your personal target blood pressure may vary. Follow these instructions at home: Eating and drinking  If told, follow the DASH eating plan. To follow this plan: Fill one half of your plate at each meal with fruits and vegetables. Fill one fourth of your plate  at each meal with whole grains. Whole grains include whole-wheat pasta, brown rice, and whole-grain bread. Eat or drink low-fat dairy products, such as skim milk or low-fat yogurt. Fill one fourth of your plate at each meal with low-fat (lean) proteins. Low-fat proteins include fish, chicken without skin, eggs, beans, and tofu. Avoid fatty meat, cured and processed meat, or chicken with skin. Avoid pre-made or processed food. Limit the amount of salt in your diet to less than 1,500 mg each day. Do not drink alcohol if: Your doctor tells you not to drink. You are pregnant, may be pregnant, or are planning to become pregnant. If you drink alcohol: Limit how much you have to: 0-1 drink a day for women. 0-2 drinks a day for men. Know how much alcohol is in your drink. In the [place] one drink equals one 12 oz bottle of beer (355 mL), one 5 oz glass of wine (148 mL), or one 1 oz glass of hard liquor (44 mL). Lifestyle  Work with your doctor to stay at a healthy weight or to lose weight. Ask your doctor what the best weight is for you. Get at least 30 minutes of exercise that causes your heart to beat faster (aerobic exercise) most days of the week. This may include walking, swimming, or biking. Get at least 30 minutes of exercise that strengthens your muscles (resistance exercise) at least 3 days a week. This may include lifting weights or doing Pilates. Do not smoke or use any products that contain nicotine or tobacco. If you need help quitting, ask your doctor. Check your blood pressure at home as told by your doctor. Keep all follow-up visits. Medicines Take over-the-counter and prescription medicines  only as told by your doctor. Follow directions carefully. Do not skip doses of blood pressure medicine. The medicine does not work as well if you skip doses. Skipping doses also puts you at risk for problems. Ask your doctor about side effects or reactions to medicines that you should watch  for. Contact a doctor if: You think you are having a reaction to the medicine you are taking. You have headaches that keep coming back. You feel dizzy. You have swelling in your ankles. You have trouble with your vision. Get help right away if: You get a very bad headache. You start to feel mixed up (confused). You feel weak or numb. You feel faint. You have very bad pain in your: Chest. Belly (abdomen). You vomit more than once. You have trouble breathing. These symptoms may be an emergency. Get help right away. Call 911. Do not wait to see if the symptoms will go away. Do not drive yourself to the hospital. Summary Hypertension is another name for high blood pressure. High blood pressure forces your heart to work harder to pump blood. For most people, a normal blood pressure is less than 120/80. Making healthy choices can help lower blood pressure. If your blood pressure does not get lower with healthy choices, you may need to take medicine. This information is not intended to replace advice given to you by your health care provider. Make sure you discuss any questions you have with your health care provider. Document Revised: 10/06/2020 Document Reviewed: 10/06/2020 Elsevier Patient Education  2024 [employer].

## 2024-01-30 NOTE — Progress Notes (Signed)
 I,Brianna Hawkins, CMA,acting as a neurosurgeon for [employer], NP.,have documented all relevant documentation on the behalf of Brianna Creighton, NP,as directed by  Brianna Creighton, NP while in the presence of Brianna Creighton, NP.  Subjective:  Patient ID: Brianna Hawkins , female    DOB: 07/20/1960 , 64 y.o.   MRN: [id number]  Chief Complaint  Patient presents with   Hypertension    Patient presents today for a bpc. Patient reports compliance with her meds. Patient denies having any chest pain, sob or headaches.   Diabetes    HPI Discussed the use of AI scribe software for clinical note transcription with the patient, who gave verbal consent to proceed.  History of Present Illness   Brianna Hawkins is a 64 year old female with hypertension and coronary artery disease who presents for follow-up of blood pressure management and recent cardiac testing.  Her blood pressure and blood sugar levels are well-controlled at home. She regularly checks her blood sugar, which is typically around 97 mg/dL in the morning. She takes metformin  twice daily for diabetes, and her last A1c was 6.0. She exercises on a treadmill about three times a week and maintains a diet rich in fruits, salads, and vegetables. She also takes a multivitamin and Flonase  for allergies.  She recently visited her cardiologist for a follow-up to assess heart disease and ensure her arteries are not clogged. She underwent a CT cardiac scoring test after taking propranolol  and two glycerin pills. This is her second time having this test, with the first occurring in 2017/02/15 after her sister passed away from heart disease. She is currently on two cholesterol medications and reports that her cholesterol numbers are low.  She experienced flu-like symptoms after receiving four shots at [facility] and exposure to her grandson who had the flu. Although her flu test was negative, she took flu medication as a precaution to prevent spreading illness at her  workplace, [employer].  She has a history of polyps discovered during a Pap smear after experiencing bleeding. The polyps were removed and were not cancerous.  She has a history of nasal surgery and experiences allergies in one nostril, which she manages with Flonase . No current chest pain or swelling. Her sister passed away from heart disease.     Brianna Hawkins is a 64 year old female with hypertension and coronary artery disease who presents for follow-up of blood pressure management and recent cardiac testing.  Her blood pressure and blood sugar levels are well-controlled at home. She regularly checks her blood sugar, which is typically around 97 mg/dL in the morning. She takes metformin  twice daily for diabetes, and her last A1c was 6.0. She exercises on a treadmill about three times a week and maintains a diet rich in fruits, salads, and vegetables. She also takes a multivitamin and Flonase  for allergies.  She recently visited her cardiologist for a follow-up to assess heart disease and ensure her arteries are not clogged. She underwent a CT cardiac scoring test after taking propranolol  and two glycerin pills. This is her second time having this test, with the first occurring in February 15, 2017 after her sister passed away from heart disease. She is currently on two cholesterol medications and reports that her cholesterol numbers are low.  She experienced flu-like symptoms after receiving four shots at [facility] and exposure to her grandson who had the flu. Although her flu test was negative, she took flu medication as a precaution to prevent spreading illness at her  workplace, [employer].  She has a history of polyps discovered during a Pap smear after experiencing bleeding. The polyps were removed and were not cancerous.  She has a history of nasal surgery and experiences allergies in one nostril, which she manages with Flonase . No current chest pain or swelling. Her sister passed away from heart  disease.       Hypertension This is a chronic problem. The current episode started more than 1 year ago. The problem has been gradually improving since onset. The problem is controlled. Associated symptoms include anxiety, headaches and malaise/fatigue. There are no associated agents to hypertension. Risk factors for coronary artery disease include diabetes mellitus, dyslipidemia, obesity and post-menopausal state. Past treatments include calcium  channel blockers, beta blockers, diuretics and lifestyle changes. The current treatment provides significant improvement. There are no compliance problems.      Past Medical History:  Diagnosis Date   Coronary artery calcification seen on CT scan    cardiologist-- dr calhoun; Coronary CTA 9/19: Ca score = 1 (80th percentile); calcified plaque distal LM; no obstructive CAD.   DM2 (diabetes mellitus, type 2) (HCC)    followed by pcp-- (09-29-2020 pt stated check blood sugar twice wkly in am,  fasting sugar--- unsure per pt)   DOE (dyspnea on exertion)    09-29-2020  per pt sob w/ any activity , especially stairs, stated uses rescue inhaler and recovers quickly   Endometrial polyp    Hysteroscopy D & C 10/22   GERD (gastroesophageal reflux disease)    H/O: substance abuse (HCC)    per pt last used crack cocaine in 1998   Heart murmur    last echo in epic 03-26-2019, ef 50-55%, trivial TR   History of syncope 2014   vasovagal   History of treatment for tuberculosis 1998   per pt treated in 1998;  pt had bronchoscopy w/ bx's RUL 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   HLD (hyperlipidemia)    Hypertension    Migraine    OA (osteoarthritis)    PMB (postmenopausal bleeding)    Postmenopausal bleeding 07/01/2020   Tuberculosis 01/02/1996   7 YEARS AGO   Urinary urgency    Vertigo      Family History  Problem Relation Age of Onset   Alcohol abuse Mother    Diabetes Mother    Heart disease Mother    Cancer Mother     Esophageal cancer Father    Rectal cancer Father    Throat cancer Father        was a smoker   Colon cancer Father    Alcohol abuse Father    Cancer Father 62       colon cancer   HIV Sister    Heart disease Sister 15       died age 84 Heart was weak   Stroke Sister    Stomach cancer Maternal Aunt    Diabetes Maternal Grandmother    Diabetes Paternal Grandmother    Dementia Paternal Grandmother    Hypertension Paternal Grandmother    Breast cancer Paternal Grandmother    Skin cancer Paternal Grandmother    Sudden Cardiac Death Neg Hx     Current Medications[1]   Allergies[2]   Review of Systems  Constitutional:  Positive for malaise/fatigue.  HENT: Negative.    Respiratory: Negative.    Cardiovascular: Negative.   Gastrointestinal: Negative.   Musculoskeletal: Negative.   Neurological:  Positive for headaches.     Today's Vitals  01/30/24 1150  BP: 112/80  Pulse: 83  Temp: 98.6 F (37 C)  TempSrc: Oral  Weight: 234 lb (106.1 kg)  Height: 5' 6 (1.676 m)  PainSc: 0-No pain   Body mass index is 37.77 kg/m.  Wt Readings from Last 3 Encounters:  01/30/24 234 lb (106.1 kg)  01/10/24 234 lb 6.4 oz (106.3 kg)  01/09/24 232 lb 11.2 oz (105.6 kg)    The 10-year ASCVD risk score (Arnett DK, et al., 2019) is: 10.5%   Values used to calculate the score:     Age: 33 years     Clinically relevant sex: Female     Is Non-Hispanic African American: Yes     Diabetic: Yes     Tobacco smoker: No     Systolic Blood Pressure: 112 mmHg     Is BP treated: Yes     HDL Cholesterol: 61 mg/dL     Total Cholesterol: 190 mg/dL  Objective:  Physical Exam Constitutional:      Appearance: Normal appearance.  HENT:     Head: Normocephalic.  Cardiovascular:     Rate and Rhythm: Normal rate and regular rhythm.     Pulses: Normal pulses.     Heart sounds: Normal heart sounds.  Pulmonary:     Effort: Pulmonary effort is normal.     Breath sounds: Normal breath sounds.   Abdominal:     General: Bowel sounds are normal.  Skin:    General: Skin is warm and dry.  Neurological:     Mental Status: She is alert and oriented to person, place, and time. Mental status is at baseline.         Assessment And Plan:   Assessment & Plan Hypertension associated with diabetes (HCC) -Well controlled with Amlodipine  10 mg and hydrochlorothiazide  25 mg every day  - Continue current antihypertensive regimen.  Hyperlipidemia associated with type 2 diabetes mellitus (HCC) Managed with two cholesterol medications. CT cardiac scoring test showed low plaque burden. . - Continue current cholesterol medications. Type 2 diabetes mellitus with hyperglycemia, without long-term current use of insulin (HCC) A1c well-controlled at 6.0-6.4. Discussed adding Jardiance  for glycemic control and renal protection. Emphasized diet and exercise. - Added Jardiance  10 mg daily with breakfast. - Reduced metformin  to once daily. - Instructed to monitor blood glucose levels regularly. - Educated on signs of hypoglycemia. - Encouraged consistent exercise and dietary modifications Chronic GERD Continue current acid reflux medication.  Obesity, diabetes, and hypertension syndrome (HCC) -She is encouraged to strive for BMI less than 30 to decrease cardiac risk. Advised to aim for at least 150 minutes of exercise per week.     Return in about 4 months (around 05/29/2024) for bp/dm check.  Patient was given opportunity to ask questions. Patient verbalized understanding of the plan and was able to repeat key elements of the plan. All questions were answered to their satisfaction.    IF YOU HAVE BEEN REFERRED TO A SPECIALIST, IT MAY TAKE 1-2 WEEKS TO SCHEDULE/PROCESS THE REFERRAL. IF YOU HAVE NOT HEARD FROM US /SPECIALIST IN TWO WEEKS, PLEASE GIVE US  A CALL AT [phone].      [1]  Current Outpatient Medications:    AGAMATRIX ULTRA-THIN LANCETS MISC, Check sugars twice daily before  meals, Disp: 100 each, Rfl: 11   albuterol  (VENTOLIN  HFA) 108 (90 Base) MCG/ACT inhaler, Inhale 2 puffs into the lungs every 6 (six) hours as needed for wheezing or shortness of breath., Disp: , Rfl:  amLODipine  (NORVASC ) 10 MG tablet, Take 1 tablet by mouth once daily, Disp: 90 tablet, Rfl: 0   Blood Glucose Monitoring Suppl (AGAMATRIX PRESTO) w/Device KIT, Check sugars twice daily before meals, Disp: 1 kit, Rfl: 0   Cholecalciferol (VITAMIN D3) 50 MCG (2000 UT) TABS, Take 5,000 Units by mouth daily. , Disp: , Rfl:    diclofenac  (VOLTAREN ) 50 MG EC tablet, Take 1 tablet (50 mg total) by mouth 2 (two) times daily., Disp: 20 tablet, Rfl: 0   empagliflozin  (JARDIANCE ) 10 MG TABS tablet, Take 1 tablet (10 mg total) by mouth daily before breakfast., Disp: 90 tablet, Rfl: 1   ezetimibe  (ZETIA ) 10 MG tablet, Take 1 tablet (10 mg total) by mouth daily., Disp: 90 tablet, Rfl: 1   fluticasone  (FLONASE ) 50 MCG/ACT nasal spray, Place 2 sprays into both nostrils daily., Disp: 16 g, Rfl: 3   glucose blood (AGAMATRIX PRESTO TEST) test strip, Check blood glucose twice daily before meals, Disp: 100 each, Rfl: 11   hydrochlorothiazide  (HYDRODIURIL ) 25 MG tablet, Take 1 tablet by mouth once daily, Disp: 90 tablet, Rfl: 0   ibuprofen  (ADVIL ) 800 MG tablet, Take 1 tablet (800 mg total) by mouth every 8 (eight) hours as needed., Disp: 30 tablet, Rfl: 0   Multiple Vitamins-Minerals (HAIR/SKIN/NAILS) TABS, Take 1 tablet by mouth daily., Disp: , Rfl:    nitroGLYCERIN  (NITROSTAT ) 0.4 MG SL tablet, 1 tab under tongue for chest pressure, may repeat every 5 minutes if not relieved for total of 3 doses., Disp: 24 tablet, Rfl: 3   ondansetron  (ZOFRAN ) 4 MG tablet, Take 1 tablet (4 mg total) by mouth every 8 (eight) hours as needed for nausea or vomiting., Disp: 6 tablet, Rfl: 0   propranolol  (INDERAL ) 10 MG tablet, Take 1 tablet (10 mg total) by mouth 3 (three) times daily., Disp: 120 tablet, Rfl: 4   rizatriptan  (MAXALT ) 10 MG  tablet, Take 10 mg by mouth as needed for migraine. May repeat in 2 hours if needed, Disp: , Rfl:    rosuvastatin  (CRESTOR ) 20 MG tablet, Take 1 tablet (20 mg total) by mouth daily., Disp: 90 tablet, Rfl: 3   zolpidem  (AMBIEN ) 5 MG tablet, Take 1 tablet (5 mg total) by mouth at bedtime as needed for sleep., Disp: 30 tablet, Rfl: 0   esomeprazole  (NEXIUM ) 40 MG capsule, Take 1 capsule (40 mg total) by mouth daily., Disp: 90 capsule, Rfl: 2   metFORMIN  (GLUCOPHAGE -XR) 500 MG 24 hr tablet, Take 1 tablet (500 mg total) by mouth daily with breakfast., Disp: 90 tablet, Rfl: 1 [2] No Known Allergies

## 2024-01-31 LAB — LIPID PANEL
Chol/HDL Ratio: 3.1 ratio (ref 0.0–4.4)
Cholesterol, Total: 190 mg/dL (ref 100–199)
HDL: 61 mg/dL
LDL Chol Calc (NIH): 117 mg/dL — ABNORMAL HIGH (ref 0–99)
Triglycerides: 67 mg/dL (ref 0–149)
VLDL Cholesterol Cal: 12 mg/dL (ref 5–40)

## 2024-01-31 LAB — BASIC METABOLIC PANEL WITH GFR
BUN/Creatinine Ratio: 21 (ref 12–28)
BUN: 17 mg/dL (ref 8–27)
CO2: 21 mmol/L (ref 20–29)
Calcium: 9.8 mg/dL (ref 8.7–10.3)
Chloride: 99 mmol/L (ref 96–106)
Creatinine, Ser: 0.8 mg/dL (ref 0.57–1.00)
Glucose: 89 mg/dL (ref 70–99)
Potassium: 4.2 mmol/L (ref 3.5–5.2)
Sodium: 138 mmol/L (ref 134–144)
eGFR: 84 mL/min/{1.73_m2}

## 2024-01-31 LAB — CBC
Hematocrit: 38.7 % (ref 34.0–46.6)
Hemoglobin: 12.5 g/dL (ref 11.1–15.9)
MCH: 29.3 pg (ref 26.6–33.0)
MCHC: 32.3 g/dL (ref 31.5–35.7)
MCV: 91 fL (ref 79–97)
Platelets: 280 10*3/uL (ref 150–450)
RBC: 4.27 x10E6/uL (ref 3.77–5.28)
RDW: 14.5 % (ref 11.7–15.4)
WBC: 5 10*3/uL (ref 3.4–10.8)

## 2024-01-31 LAB — HEMOGLOBIN A1C
Est. average glucose Bld gHb Est-mCnc: 128 mg/dL
Hgb A1c MFr Bld: 6.1 % — ABNORMAL HIGH (ref 4.8–5.6)

## 2024-02-06 ENCOUNTER — Ambulatory Visit: Payer: Self-pay | Admitting: Family Medicine

## 2024-02-06 DIAGNOSIS — E119 Type 2 diabetes mellitus without complications: Secondary | ICD-10-CM | POA: Insufficient documentation

## 2024-02-06 NOTE — Assessment & Plan Note (Signed)
-  Well controlled with Amlodipine  10 mg and hydrochlorothiazide  25 mg every day  - Continue current antihypertensive regimen.

## 2024-02-06 NOTE — Assessment & Plan Note (Signed)
 She is encouraged to strive for BMI less than 30 to decrease cardiac risk. Advised to aim for at least 150 minutes of exercise per week.

## 2024-02-06 NOTE — Assessment & Plan Note (Signed)
 Managed with two cholesterol medications. CT cardiac scoring test showed low plaque burden. . - Continue current cholesterol medications.

## 2024-02-06 NOTE — Assessment & Plan Note (Signed)
 A1c well-controlled at 6.0-6.4. Discussed adding Jardiance  for glycemic control and renal protection. Emphasized diet and exercise. - Added Jardiance  10 mg daily with breakfast. - Reduced metformin  to once daily. - Instructed to monitor blood glucose levels regularly. - Educated on signs of hypoglycemia. - Encouraged consistent exercise and dietary modifications

## 2024-02-06 NOTE — Assessment & Plan Note (Signed)
 Continue current acid reflux medication.

## 2024-02-06 NOTE — Progress Notes (Signed)
 Your A1c is stable at 6.1.good job, keep taking your medications as prescribed. Your LDL has increased by almost 40 points. Please in addition to taking your cholesterol medications, make sure to maintain a low fat diet and exercise.  Thank you!

## 2024-05-28 ENCOUNTER — Ambulatory Visit: Payer: Self-pay | Admitting: Family Medicine

## 2024-09-30 ENCOUNTER — Encounter: Payer: Self-pay | Admitting: Family Medicine
